# Patient Record
Sex: Male | Born: 1950
Health system: Southern US, Community
[De-identification: ages and names within clinical notes are randomized; demographics above are authoritative.]

## PROBLEM LIST (undated history)

## (undated) DIAGNOSIS — R0600 Dyspnea, unspecified: Secondary | ICD-10-CM

## (undated) DIAGNOSIS — A419 Sepsis, unspecified organism: Secondary | ICD-10-CM

## (undated) DIAGNOSIS — M1990 Unspecified osteoarthritis, unspecified site: Secondary | ICD-10-CM

## (undated) DIAGNOSIS — N186 End stage renal disease: Secondary | ICD-10-CM

## (undated) DIAGNOSIS — E1142 Type 2 diabetes mellitus with diabetic polyneuropathy: Secondary | ICD-10-CM

## (undated) DIAGNOSIS — I509 Heart failure, unspecified: Secondary | ICD-10-CM

## (undated) DIAGNOSIS — E119 Type 2 diabetes mellitus without complications: Secondary | ICD-10-CM

## (undated) DIAGNOSIS — I639 Cerebral infarction, unspecified: Secondary | ICD-10-CM

## (undated) DIAGNOSIS — Z9889 Other specified postprocedural states: Secondary | ICD-10-CM

## (undated) DIAGNOSIS — E1122 Type 2 diabetes mellitus with diabetic chronic kidney disease: Secondary | ICD-10-CM

## (undated) DIAGNOSIS — D649 Anemia, unspecified: Secondary | ICD-10-CM

## (undated) DIAGNOSIS — N183 Chronic kidney disease, stage 3 unspecified: Secondary | ICD-10-CM

## (undated) DIAGNOSIS — E039 Hypothyroidism, unspecified: Secondary | ICD-10-CM

## (undated) DIAGNOSIS — R51 Headache: Secondary | ICD-10-CM

## (undated) DIAGNOSIS — K219 Gastro-esophageal reflux disease without esophagitis: Secondary | ICD-10-CM

## (undated) DIAGNOSIS — I10 Essential (primary) hypertension: Secondary | ICD-10-CM

## (undated) DIAGNOSIS — R112 Nausea with vomiting, unspecified: Secondary | ICD-10-CM

## (undated) DIAGNOSIS — G2581 Restless legs syndrome: Secondary | ICD-10-CM

## (undated) DIAGNOSIS — Z992 Dependence on renal dialysis: Secondary | ICD-10-CM

## (undated) DIAGNOSIS — R519 Headache, unspecified: Secondary | ICD-10-CM

## (undated) HISTORY — PX: POSTERIOR LUMBAR FUSION: SHX6036

## (undated) HISTORY — PX: CATARACT EXTRACTION W/ INTRAOCULAR LENS IMPLANT: SHX1309

## (undated) HISTORY — PX: BACK SURGERY: SHX140

---

## 2005-03-28 DIAGNOSIS — I639 Cerebral infarction, unspecified: Secondary | ICD-10-CM

## 2005-03-28 HISTORY — DX: Cerebral infarction, unspecified: I63.9

## 2005-11-04 ENCOUNTER — Ambulatory Visit (HOSPITAL_BASED_OUTPATIENT_CLINIC_OR_DEPARTMENT_OTHER): Admission: RE | Admit: 2005-11-04 | Discharge: 2005-11-04 | Payer: Self-pay | Admitting: Orthopaedic Surgery

## 2005-12-02 ENCOUNTER — Ambulatory Visit (HOSPITAL_BASED_OUTPATIENT_CLINIC_OR_DEPARTMENT_OTHER): Admission: RE | Admit: 2005-12-02 | Discharge: 2005-12-02 | Payer: Self-pay | Admitting: *Deleted

## 2006-03-29 ENCOUNTER — Inpatient Hospital Stay (HOSPITAL_COMMUNITY): Admission: RE | Admit: 2006-03-29 | Discharge: 2006-04-02 | Payer: Self-pay | Admitting: Orthopaedic Surgery

## 2013-03-28 HISTORY — PX: CIRCUMCISION: SUR203

## 2014-03-28 HISTORY — PX: HERNIA REPAIR: SHX51

## 2014-03-31 DIAGNOSIS — Z6827 Body mass index (BMI) 27.0-27.9, adult: Secondary | ICD-10-CM | POA: Diagnosis not present

## 2014-03-31 DIAGNOSIS — E1165 Type 2 diabetes mellitus with hyperglycemia: Secondary | ICD-10-CM | POA: Diagnosis not present

## 2014-03-31 DIAGNOSIS — Z2821 Immunization not carried out because of patient refusal: Secondary | ICD-10-CM | POA: Diagnosis not present

## 2014-03-31 DIAGNOSIS — N481 Balanitis: Secondary | ICD-10-CM | POA: Diagnosis not present

## 2014-03-31 DIAGNOSIS — I10 Essential (primary) hypertension: Secondary | ICD-10-CM | POA: Diagnosis not present

## 2014-03-31 DIAGNOSIS — Z720 Tobacco use: Secondary | ICD-10-CM | POA: Diagnosis not present

## 2014-05-06 DIAGNOSIS — Z23 Encounter for immunization: Secondary | ICD-10-CM | POA: Diagnosis not present

## 2014-05-06 DIAGNOSIS — Z887 Allergy status to serum and vaccine status: Secondary | ICD-10-CM | POA: Diagnosis not present

## 2014-05-06 DIAGNOSIS — E039 Hypothyroidism, unspecified: Secondary | ICD-10-CM | POA: Diagnosis not present

## 2014-05-06 DIAGNOSIS — E785 Hyperlipidemia, unspecified: Secondary | ICD-10-CM | POA: Diagnosis not present

## 2014-05-06 DIAGNOSIS — E1165 Type 2 diabetes mellitus with hyperglycemia: Secondary | ICD-10-CM | POA: Diagnosis not present

## 2014-05-06 DIAGNOSIS — N401 Enlarged prostate with lower urinary tract symptoms: Secondary | ICD-10-CM | POA: Diagnosis not present

## 2014-05-06 DIAGNOSIS — Z6827 Body mass index (BMI) 27.0-27.9, adult: Secondary | ICD-10-CM | POA: Diagnosis not present

## 2014-05-06 DIAGNOSIS — Z1211 Encounter for screening for malignant neoplasm of colon: Secondary | ICD-10-CM | POA: Diagnosis not present

## 2014-05-06 DIAGNOSIS — I10 Essential (primary) hypertension: Secondary | ICD-10-CM | POA: Diagnosis not present

## 2014-06-03 DIAGNOSIS — E1165 Type 2 diabetes mellitus with hyperglycemia: Secondary | ICD-10-CM | POA: Diagnosis not present

## 2014-06-03 DIAGNOSIS — N401 Enlarged prostate with lower urinary tract symptoms: Secondary | ICD-10-CM | POA: Diagnosis not present

## 2014-06-03 DIAGNOSIS — E039 Hypothyroidism, unspecified: Secondary | ICD-10-CM | POA: Diagnosis not present

## 2014-06-03 DIAGNOSIS — M791 Myalgia: Secondary | ICD-10-CM | POA: Diagnosis not present

## 2014-06-03 DIAGNOSIS — N521 Erectile dysfunction due to diseases classified elsewhere: Secondary | ICD-10-CM | POA: Diagnosis not present

## 2014-06-03 DIAGNOSIS — I10 Essential (primary) hypertension: Secondary | ICD-10-CM | POA: Diagnosis not present

## 2014-06-03 DIAGNOSIS — Z6827 Body mass index (BMI) 27.0-27.9, adult: Secondary | ICD-10-CM | POA: Diagnosis not present

## 2014-09-09 DIAGNOSIS — E039 Hypothyroidism, unspecified: Secondary | ICD-10-CM | POA: Diagnosis not present

## 2014-09-09 DIAGNOSIS — I739 Peripheral vascular disease, unspecified: Secondary | ICD-10-CM | POA: Diagnosis not present

## 2014-09-09 DIAGNOSIS — Z6827 Body mass index (BMI) 27.0-27.9, adult: Secondary | ICD-10-CM | POA: Diagnosis not present

## 2014-09-09 DIAGNOSIS — N481 Balanitis: Secondary | ICD-10-CM | POA: Diagnosis not present

## 2014-09-09 DIAGNOSIS — E1129 Type 2 diabetes mellitus with other diabetic kidney complication: Secondary | ICD-10-CM | POA: Diagnosis not present

## 2014-09-09 DIAGNOSIS — E785 Hyperlipidemia, unspecified: Secondary | ICD-10-CM | POA: Diagnosis not present

## 2014-09-09 DIAGNOSIS — I10 Essential (primary) hypertension: Secondary | ICD-10-CM | POA: Diagnosis not present

## 2014-09-16 DIAGNOSIS — N3090 Cystitis, unspecified without hematuria: Secondary | ICD-10-CM | POA: Diagnosis not present

## 2014-09-16 DIAGNOSIS — M545 Low back pain: Secondary | ICD-10-CM | POA: Diagnosis not present

## 2014-09-16 DIAGNOSIS — N318 Other neuromuscular dysfunction of bladder: Secondary | ICD-10-CM | POA: Diagnosis not present

## 2014-09-16 DIAGNOSIS — N401 Enlarged prostate with lower urinary tract symptoms: Secondary | ICD-10-CM | POA: Diagnosis not present

## 2014-09-16 DIAGNOSIS — N471 Phimosis: Secondary | ICD-10-CM | POA: Diagnosis not present

## 2014-09-16 DIAGNOSIS — R351 Nocturia: Secondary | ICD-10-CM | POA: Diagnosis not present

## 2014-09-16 DIAGNOSIS — N476 Balanoposthitis: Secondary | ICD-10-CM | POA: Diagnosis not present

## 2014-09-16 DIAGNOSIS — N481 Balanitis: Secondary | ICD-10-CM | POA: Diagnosis not present

## 2014-09-24 DIAGNOSIS — N476 Balanoposthitis: Secondary | ICD-10-CM | POA: Diagnosis not present

## 2014-09-24 DIAGNOSIS — E039 Hypothyroidism, unspecified: Secondary | ICD-10-CM | POA: Diagnosis not present

## 2014-09-24 DIAGNOSIS — J449 Chronic obstructive pulmonary disease, unspecified: Secondary | ICD-10-CM | POA: Diagnosis not present

## 2014-09-24 DIAGNOSIS — N471 Phimosis: Secondary | ICD-10-CM | POA: Diagnosis not present

## 2014-09-24 DIAGNOSIS — E785 Hyperlipidemia, unspecified: Secondary | ICD-10-CM | POA: Diagnosis not present

## 2014-09-24 DIAGNOSIS — Z8673 Personal history of transient ischemic attack (TIA), and cerebral infarction without residual deficits: Secondary | ICD-10-CM | POA: Diagnosis not present

## 2014-09-24 DIAGNOSIS — E119 Type 2 diabetes mellitus without complications: Secondary | ICD-10-CM | POA: Diagnosis not present

## 2014-09-24 DIAGNOSIS — K219 Gastro-esophageal reflux disease without esophagitis: Secondary | ICD-10-CM | POA: Diagnosis not present

## 2014-09-24 DIAGNOSIS — I10 Essential (primary) hypertension: Secondary | ICD-10-CM | POA: Diagnosis not present

## 2014-10-02 DIAGNOSIS — N471 Phimosis: Secondary | ICD-10-CM | POA: Diagnosis not present

## 2014-10-02 DIAGNOSIS — N3090 Cystitis, unspecified without hematuria: Secondary | ICD-10-CM | POA: Diagnosis not present

## 2014-10-02 DIAGNOSIS — N476 Balanoposthitis: Secondary | ICD-10-CM | POA: Diagnosis not present

## 2014-10-13 DIAGNOSIS — Z139 Encounter for screening, unspecified: Secondary | ICD-10-CM | POA: Diagnosis not present

## 2014-10-13 DIAGNOSIS — Z1389 Encounter for screening for other disorder: Secondary | ICD-10-CM | POA: Diagnosis not present

## 2014-10-13 DIAGNOSIS — I10 Essential (primary) hypertension: Secondary | ICD-10-CM | POA: Diagnosis not present

## 2014-10-13 DIAGNOSIS — Z9181 History of falling: Secondary | ICD-10-CM | POA: Diagnosis not present

## 2014-10-13 DIAGNOSIS — Z6827 Body mass index (BMI) 27.0-27.9, adult: Secondary | ICD-10-CM | POA: Diagnosis not present

## 2014-10-28 DIAGNOSIS — N401 Enlarged prostate with lower urinary tract symptoms: Secondary | ICD-10-CM | POA: Diagnosis not present

## 2014-10-28 DIAGNOSIS — N471 Phimosis: Secondary | ICD-10-CM | POA: Diagnosis not present

## 2014-10-28 DIAGNOSIS — N3090 Cystitis, unspecified without hematuria: Secondary | ICD-10-CM | POA: Diagnosis not present

## 2014-10-28 DIAGNOSIS — N318 Other neuromuscular dysfunction of bladder: Secondary | ICD-10-CM | POA: Diagnosis not present

## 2014-10-28 DIAGNOSIS — H52209 Unspecified astigmatism, unspecified eye: Secondary | ICD-10-CM | POA: Diagnosis not present

## 2014-10-28 DIAGNOSIS — H5203 Hypermetropia, bilateral: Secondary | ICD-10-CM | POA: Diagnosis not present

## 2014-10-28 DIAGNOSIS — N476 Balanoposthitis: Secondary | ICD-10-CM | POA: Diagnosis not present

## 2014-10-28 DIAGNOSIS — N5203 Combined arterial insufficiency and corporo-venous occlusive erectile dysfunction: Secondary | ICD-10-CM | POA: Diagnosis not present

## 2014-10-28 DIAGNOSIS — Z0100 Encounter for examination of eyes and vision without abnormal findings: Secondary | ICD-10-CM | POA: Diagnosis not present

## 2014-10-28 DIAGNOSIS — H524 Presbyopia: Secondary | ICD-10-CM | POA: Diagnosis not present

## 2014-11-03 DIAGNOSIS — I10 Essential (primary) hypertension: Secondary | ICD-10-CM | POA: Diagnosis not present

## 2014-11-03 DIAGNOSIS — Z6827 Body mass index (BMI) 27.0-27.9, adult: Secondary | ICD-10-CM | POA: Diagnosis not present

## 2014-12-15 DIAGNOSIS — E039 Hypothyroidism, unspecified: Secondary | ICD-10-CM | POA: Diagnosis not present

## 2014-12-15 DIAGNOSIS — Z6827 Body mass index (BMI) 27.0-27.9, adult: Secondary | ICD-10-CM | POA: Diagnosis not present

## 2014-12-15 DIAGNOSIS — Z720 Tobacco use: Secondary | ICD-10-CM | POA: Diagnosis not present

## 2014-12-15 DIAGNOSIS — I10 Essential (primary) hypertension: Secondary | ICD-10-CM | POA: Diagnosis not present

## 2014-12-15 DIAGNOSIS — E1129 Type 2 diabetes mellitus with other diabetic kidney complication: Secondary | ICD-10-CM | POA: Diagnosis not present

## 2014-12-15 DIAGNOSIS — E291 Testicular hypofunction: Secondary | ICD-10-CM | POA: Diagnosis not present

## 2014-12-15 DIAGNOSIS — E785 Hyperlipidemia, unspecified: Secondary | ICD-10-CM | POA: Diagnosis not present

## 2015-01-14 DIAGNOSIS — I10 Essential (primary) hypertension: Secondary | ICD-10-CM | POA: Diagnosis not present

## 2015-01-14 DIAGNOSIS — Z6828 Body mass index (BMI) 28.0-28.9, adult: Secondary | ICD-10-CM | POA: Diagnosis not present

## 2015-01-14 DIAGNOSIS — E1129 Type 2 diabetes mellitus with other diabetic kidney complication: Secondary | ICD-10-CM | POA: Diagnosis not present

## 2015-01-28 DIAGNOSIS — I10 Essential (primary) hypertension: Secondary | ICD-10-CM | POA: Diagnosis not present

## 2015-01-28 DIAGNOSIS — E1129 Type 2 diabetes mellitus with other diabetic kidney complication: Secondary | ICD-10-CM | POA: Diagnosis not present

## 2015-01-28 DIAGNOSIS — Z6828 Body mass index (BMI) 28.0-28.9, adult: Secondary | ICD-10-CM | POA: Diagnosis not present

## 2015-01-28 DIAGNOSIS — Z139 Encounter for screening, unspecified: Secondary | ICD-10-CM | POA: Diagnosis not present

## 2015-02-02 DIAGNOSIS — R319 Hematuria, unspecified: Secondary | ICD-10-CM | POA: Diagnosis not present

## 2015-02-02 DIAGNOSIS — R51 Headache: Secondary | ICD-10-CM | POA: Diagnosis not present

## 2015-02-02 DIAGNOSIS — I159 Secondary hypertension, unspecified: Secondary | ICD-10-CM | POA: Diagnosis not present

## 2015-02-02 DIAGNOSIS — R0602 Shortness of breath: Secondary | ICD-10-CM | POA: Diagnosis not present

## 2015-02-02 DIAGNOSIS — I10 Essential (primary) hypertension: Secondary | ICD-10-CM | POA: Diagnosis not present

## 2015-02-02 DIAGNOSIS — R109 Unspecified abdominal pain: Secondary | ICD-10-CM | POA: Diagnosis not present

## 2015-02-02 DIAGNOSIS — R1012 Left upper quadrant pain: Secondary | ICD-10-CM | POA: Diagnosis not present

## 2015-02-02 DIAGNOSIS — R531 Weakness: Secondary | ICD-10-CM | POA: Diagnosis not present

## 2015-02-11 DIAGNOSIS — I10 Essential (primary) hypertension: Secondary | ICD-10-CM | POA: Diagnosis not present

## 2015-02-11 DIAGNOSIS — Z6827 Body mass index (BMI) 27.0-27.9, adult: Secondary | ICD-10-CM | POA: Diagnosis not present

## 2015-03-17 DIAGNOSIS — Z125 Encounter for screening for malignant neoplasm of prostate: Secondary | ICD-10-CM | POA: Diagnosis not present

## 2015-03-17 DIAGNOSIS — E1129 Type 2 diabetes mellitus with other diabetic kidney complication: Secondary | ICD-10-CM | POA: Diagnosis not present

## 2015-03-17 DIAGNOSIS — E039 Hypothyroidism, unspecified: Secondary | ICD-10-CM | POA: Diagnosis not present

## 2015-03-17 DIAGNOSIS — K219 Gastro-esophageal reflux disease without esophagitis: Secondary | ICD-10-CM | POA: Diagnosis not present

## 2015-03-17 DIAGNOSIS — E785 Hyperlipidemia, unspecified: Secondary | ICD-10-CM | POA: Diagnosis not present

## 2015-03-17 DIAGNOSIS — Z6827 Body mass index (BMI) 27.0-27.9, adult: Secondary | ICD-10-CM | POA: Diagnosis not present

## 2015-03-17 DIAGNOSIS — I10 Essential (primary) hypertension: Secondary | ICD-10-CM | POA: Diagnosis not present

## 2015-05-14 DIAGNOSIS — Z6826 Body mass index (BMI) 26.0-26.9, adult: Secondary | ICD-10-CM | POA: Diagnosis not present

## 2015-05-14 DIAGNOSIS — K219 Gastro-esophageal reflux disease without esophagitis: Secondary | ICD-10-CM | POA: Diagnosis not present

## 2015-05-14 DIAGNOSIS — J321 Chronic frontal sinusitis: Secondary | ICD-10-CM | POA: Diagnosis not present

## 2015-06-17 DIAGNOSIS — Z6826 Body mass index (BMI) 26.0-26.9, adult: Secondary | ICD-10-CM | POA: Diagnosis not present

## 2015-06-17 DIAGNOSIS — E785 Hyperlipidemia, unspecified: Secondary | ICD-10-CM | POA: Diagnosis not present

## 2015-06-17 DIAGNOSIS — I10 Essential (primary) hypertension: Secondary | ICD-10-CM | POA: Diagnosis not present

## 2015-06-17 DIAGNOSIS — E039 Hypothyroidism, unspecified: Secondary | ICD-10-CM | POA: Diagnosis not present

## 2015-06-17 DIAGNOSIS — Z720 Tobacco use: Secondary | ICD-10-CM | POA: Diagnosis not present

## 2015-06-17 DIAGNOSIS — E1129 Type 2 diabetes mellitus with other diabetic kidney complication: Secondary | ICD-10-CM | POA: Diagnosis not present

## 2015-07-13 DIAGNOSIS — H524 Presbyopia: Secondary | ICD-10-CM | POA: Diagnosis not present

## 2015-07-13 DIAGNOSIS — H5210 Myopia, unspecified eye: Secondary | ICD-10-CM | POA: Diagnosis not present

## 2015-09-30 DIAGNOSIS — E039 Hypothyroidism, unspecified: Secondary | ICD-10-CM | POA: Diagnosis not present

## 2015-09-30 DIAGNOSIS — Z6826 Body mass index (BMI) 26.0-26.9, adult: Secondary | ICD-10-CM | POA: Diagnosis not present

## 2015-09-30 DIAGNOSIS — E785 Hyperlipidemia, unspecified: Secondary | ICD-10-CM | POA: Diagnosis not present

## 2015-09-30 DIAGNOSIS — E1129 Type 2 diabetes mellitus with other diabetic kidney complication: Secondary | ICD-10-CM | POA: Diagnosis not present

## 2015-09-30 DIAGNOSIS — I10 Essential (primary) hypertension: Secondary | ICD-10-CM | POA: Diagnosis not present

## 2015-09-30 DIAGNOSIS — R238 Other skin changes: Secondary | ICD-10-CM | POA: Diagnosis not present

## 2015-09-30 DIAGNOSIS — Z720 Tobacco use: Secondary | ICD-10-CM | POA: Diagnosis not present

## 2015-10-01 DIAGNOSIS — D649 Anemia, unspecified: Secondary | ICD-10-CM | POA: Diagnosis not present

## 2015-10-01 DIAGNOSIS — D531 Other megaloblastic anemias, not elsewhere classified: Secondary | ICD-10-CM | POA: Diagnosis not present

## 2015-10-01 DIAGNOSIS — N183 Chronic kidney disease, stage 3 (moderate): Secondary | ICD-10-CM | POA: Diagnosis not present

## 2015-10-07 DIAGNOSIS — N183 Chronic kidney disease, stage 3 (moderate): Secondary | ICD-10-CM | POA: Diagnosis not present

## 2015-10-09 DIAGNOSIS — N189 Chronic kidney disease, unspecified: Secondary | ICD-10-CM | POA: Diagnosis not present

## 2015-10-09 DIAGNOSIS — N183 Chronic kidney disease, stage 3 (moderate): Secondary | ICD-10-CM | POA: Diagnosis not present

## 2016-01-07 DIAGNOSIS — I129 Hypertensive chronic kidney disease with stage 1 through stage 4 chronic kidney disease, or unspecified chronic kidney disease: Secondary | ICD-10-CM | POA: Diagnosis not present

## 2016-01-07 DIAGNOSIS — Z6826 Body mass index (BMI) 26.0-26.9, adult: Secondary | ICD-10-CM | POA: Diagnosis not present

## 2016-01-07 DIAGNOSIS — R110 Nausea: Secondary | ICD-10-CM | POA: Diagnosis not present

## 2016-01-07 DIAGNOSIS — Z23 Encounter for immunization: Secondary | ICD-10-CM | POA: Diagnosis not present

## 2016-01-07 DIAGNOSIS — E039 Hypothyroidism, unspecified: Secondary | ICD-10-CM | POA: Diagnosis not present

## 2016-01-07 DIAGNOSIS — N183 Chronic kidney disease, stage 3 (moderate): Secondary | ICD-10-CM | POA: Diagnosis not present

## 2016-01-07 DIAGNOSIS — E1129 Type 2 diabetes mellitus with other diabetic kidney complication: Secondary | ICD-10-CM | POA: Diagnosis not present

## 2016-01-07 DIAGNOSIS — E785 Hyperlipidemia, unspecified: Secondary | ICD-10-CM | POA: Diagnosis not present

## 2016-01-07 DIAGNOSIS — D638 Anemia in other chronic diseases classified elsewhere: Secondary | ICD-10-CM | POA: Diagnosis not present

## 2016-02-19 DIAGNOSIS — R21 Rash and other nonspecific skin eruption: Secondary | ICD-10-CM | POA: Diagnosis not present

## 2016-02-26 DIAGNOSIS — H2511 Age-related nuclear cataract, right eye: Secondary | ICD-10-CM | POA: Diagnosis not present

## 2016-03-09 ENCOUNTER — Inpatient Hospital Stay (HOSPITAL_COMMUNITY)
Admission: AD | Admit: 2016-03-09 | Discharge: 2016-03-16 | DRG: 682 | Disposition: A | Discharge status: Home / Self Care | Payer: Commercial Managed Care - HMO | Source: Other Acute Inpatient Hospital | Attending: Family Medicine | Admitting: Family Medicine

## 2016-03-09 ENCOUNTER — Encounter (HOSPITAL_COMMUNITY): Payer: Self-pay | Admitting: Internal Medicine

## 2016-03-09 DIAGNOSIS — N179 Acute kidney failure, unspecified: Secondary | ICD-10-CM | POA: Diagnosis not present

## 2016-03-09 DIAGNOSIS — F17200 Nicotine dependence, unspecified, uncomplicated: Secondary | ICD-10-CM

## 2016-03-09 DIAGNOSIS — D631 Anemia in chronic kidney disease: Secondary | ICD-10-CM | POA: Diagnosis not present

## 2016-03-09 DIAGNOSIS — N183 Chronic kidney disease, stage 3 (moderate): Secondary | ICD-10-CM | POA: Diagnosis present

## 2016-03-09 DIAGNOSIS — N189 Chronic kidney disease, unspecified: Secondary | ICD-10-CM | POA: Diagnosis not present

## 2016-03-09 DIAGNOSIS — Z79899 Other long term (current) drug therapy: Secondary | ICD-10-CM | POA: Diagnosis not present

## 2016-03-09 DIAGNOSIS — D638 Anemia in other chronic diseases classified elsewhere: Secondary | ICD-10-CM | POA: Diagnosis present

## 2016-03-09 DIAGNOSIS — Z8673 Personal history of transient ischemic attack (TIA), and cerebral infarction without residual deficits: Secondary | ICD-10-CM | POA: Diagnosis not present

## 2016-03-09 DIAGNOSIS — E119 Type 2 diabetes mellitus without complications: Secondary | ICD-10-CM

## 2016-03-09 DIAGNOSIS — N049 Nephrotic syndrome with unspecified morphologic changes: Secondary | ICD-10-CM | POA: Diagnosis present

## 2016-03-09 DIAGNOSIS — E1122 Type 2 diabetes mellitus with diabetic chronic kidney disease: Secondary | ICD-10-CM | POA: Diagnosis present

## 2016-03-09 DIAGNOSIS — E11649 Type 2 diabetes mellitus with hypoglycemia without coma: Secondary | ICD-10-CM | POA: Diagnosis not present

## 2016-03-09 DIAGNOSIS — I5031 Acute diastolic (congestive) heart failure: Secondary | ICD-10-CM | POA: Diagnosis present

## 2016-03-09 DIAGNOSIS — R0602 Shortness of breath: Secondary | ICD-10-CM | POA: Diagnosis not present

## 2016-03-09 DIAGNOSIS — Z720 Tobacco use: Secondary | ICD-10-CM | POA: Diagnosis not present

## 2016-03-09 DIAGNOSIS — I130 Hypertensive heart and chronic kidney disease with heart failure and stage 1 through stage 4 chronic kidney disease, or unspecified chronic kidney disease: Secondary | ICD-10-CM | POA: Diagnosis not present

## 2016-03-09 DIAGNOSIS — E8770 Fluid overload, unspecified: Secondary | ICD-10-CM | POA: Diagnosis not present

## 2016-03-09 DIAGNOSIS — N390 Urinary tract infection, site not specified: Secondary | ICD-10-CM | POA: Diagnosis not present

## 2016-03-09 DIAGNOSIS — F17210 Nicotine dependence, cigarettes, uncomplicated: Secondary | ICD-10-CM | POA: Diagnosis present

## 2016-03-09 DIAGNOSIS — I129 Hypertensive chronic kidney disease with stage 1 through stage 4 chronic kidney disease, or unspecified chronic kidney disease: Secondary | ICD-10-CM | POA: Diagnosis not present

## 2016-03-09 DIAGNOSIS — B952 Enterococcus as the cause of diseases classified elsewhere: Secondary | ICD-10-CM | POA: Diagnosis not present

## 2016-03-09 DIAGNOSIS — Z7982 Long term (current) use of aspirin: Secondary | ICD-10-CM | POA: Diagnosis not present

## 2016-03-09 DIAGNOSIS — Z794 Long term (current) use of insulin: Secondary | ICD-10-CM

## 2016-03-09 DIAGNOSIS — I5033 Acute on chronic diastolic (congestive) heart failure: Secondary | ICD-10-CM | POA: Diagnosis not present

## 2016-03-09 DIAGNOSIS — I10 Essential (primary) hypertension: Secondary | ICD-10-CM | POA: Diagnosis not present

## 2016-03-09 DIAGNOSIS — Z823 Family history of stroke: Secondary | ICD-10-CM | POA: Diagnosis not present

## 2016-03-09 DIAGNOSIS — E875 Hyperkalemia: Secondary | ICD-10-CM | POA: Diagnosis present

## 2016-03-09 DIAGNOSIS — E039 Hypothyroidism, unspecified: Secondary | ICD-10-CM | POA: Diagnosis not present

## 2016-03-09 DIAGNOSIS — E1129 Type 2 diabetes mellitus with other diabetic kidney complication: Secondary | ICD-10-CM | POA: Diagnosis not present

## 2016-03-09 DIAGNOSIS — J019 Acute sinusitis, unspecified: Secondary | ICD-10-CM | POA: Diagnosis not present

## 2016-03-09 DIAGNOSIS — R600 Localized edema: Secondary | ICD-10-CM | POA: Diagnosis not present

## 2016-03-09 DIAGNOSIS — G2581 Restless legs syndrome: Secondary | ICD-10-CM | POA: Diagnosis present

## 2016-03-09 DIAGNOSIS — I509 Heart failure, unspecified: Secondary | ICD-10-CM | POA: Diagnosis not present

## 2016-03-09 DIAGNOSIS — R609 Edema, unspecified: Secondary | ICD-10-CM | POA: Diagnosis present

## 2016-03-09 DIAGNOSIS — I160 Hypertensive urgency: Secondary | ICD-10-CM | POA: Diagnosis present

## 2016-03-09 DIAGNOSIS — R809 Proteinuria, unspecified: Secondary | ICD-10-CM | POA: Diagnosis not present

## 2016-03-09 HISTORY — DX: Essential (primary) hypertension: I10

## 2016-03-09 HISTORY — DX: Chronic kidney disease, stage 3 unspecified: N18.30

## 2016-03-09 HISTORY — DX: Chronic kidney disease, stage 3 (moderate): N18.3

## 2016-03-09 HISTORY — DX: Restless legs syndrome: G25.81

## 2016-03-09 HISTORY — DX: Hypothyroidism, unspecified: E03.9

## 2016-03-09 HISTORY — DX: Type 2 diabetes mellitus with diabetic chronic kidney disease: E11.22

## 2016-03-09 HISTORY — DX: Cerebral infarction, unspecified: I63.9

## 2016-03-09 NOTE — H&P (Signed)
History and Physical    Alexander Whitaker LFY:[id number] DOB: 05-19-1950 DOA: 03/09/2016  PCP: [name], MD Consultants:  [facility] - urology Patient coming from: home - lives with wife; NOK: wife, [phone]  Chief Complaint: edema  HPI: Alexander Whitaker is a 65 y.o. male with medical history significant of HTN, DM, h/o CVA, restless legs, hypothyroidism, and CKD stage 3 presenting with acute renal failure.  Started with LE edema about 1 1/2 months ago.  Broke out in a rash.  Went to see physician, was given Kenalog shot.  Did not help fluid, but did help rash some (still present "a tad").  SOB x 1 month, progressive.  Worse with ambulation, sometimes while lying flat feels smothered.  Has used an inhaler with some improvement.  This AM, noticed severe SOB.  Took an [employer] advertising and got better so did not call 911.  Coughs, "feels like my throat's gonna come up", thinks it might be related to sinuses.  Cough only sporadic.   At [facility] today, BP was 221/94, unable to get it down for a while.   Has seen nephrology - had circumcision about 2 years ago, lifetime 30-35 kidney stones, lithotripsy once.  Urologist did not see any stones.     Review of Systems: As per HPI; otherwise 10 point review of systems reviewed and negative.   Ambulatory Status:  Ambulates without assistance  Past Medical History:  Diagnosis Date  . CKD stage 3 due to type 2 diabetes mellitus ([place])   . CVA (cerebral vascular accident) ([place]) 2016  . Diabetes mellitus ([place])   . Hypertension   . Hypothyroidism   . Restless legs     Past Surgical History:  Procedure Laterality Date  . BACK SURGERY    . CIRCUMCISION  2015  . HERNIA REPAIR  2016    Social History   Social History  . Marital status: Married    Spouse name: N/A  . Number of children: N/A  . Years of education: N/A   Occupational History  . retired    Social History Main Topics  . Smoking status: Current Every Day Smoker   Packs/day: 1.00    Years: 57.00  . Smokeless tobacco: Never Used  . Alcohol use Yes     Comment: rare beer  . Drug use: No  . Sexual activity: Not on file   Other Topics Concern  . Not on file   Social History Narrative  . No narrative on file    Allergies not on file  Family History  Problem Relation Age of Onset  . Leukemia Mother 10  . CVA Father 40    Prior to Admission medications   Not on File    Physical Exam: Vitals:   03/09/16 2157  BP: (!) 184/73  Pulse: 80  Resp: 18  Temp: 98.8 F (37.1 C)  TempSrc: Oral  SpO2: 100%     General:  Appears calm and comfortable and is NAD Eyes:  normal lids, iris ENT:  grossly normal hearing, lips & tongue, mmm Neck:  no LAD, masses or thyromegaly Cardiovascular:  RRR, no m/r/g. 2+ LE edema.  Respiratory:  Mild bibasilar crackles. Normal respiratory effort. Abdomen:  soft, ntnd, NABS Skin:  no rash or induration seen on limited exam Musculoskeletal:  grossly normal tone BUE/BLE, good ROM, no bony abnormality Psychiatric:  grossly normal mood and affect, speech fluent and appropriate, AOx3 Neurologic:  CN 2-12 grossly intact, moves all extremities in coordinated fashion,  sensation intact  Labs on Admission: I have personally reviewed following labs and imaging studies  CBC: No results for input(s): WBC, NEUTROABS, HGB, HCT, MCV, PLT in the last 168 hours. Basic Metabolic Panel: No results for input(s): NA, K, CL, CO2, GLUCOSE, BUN, CREATININE, CALCIUM, MG, PHOS in the last 168 hours. GFR: CrCl cannot be calculated (No order found.). Liver Function Tests: No results for input(s): AST, ALT, ALKPHOS, BILITOT, PROT, ALBUMIN in the last 168 hours. No results for input(s): LIPASE, AMYLASE in the last 168 hours. No results for input(s): AMMONIA in the last 168 hours. Coagulation Profile: No results for input(s): INR, PROTIME in the last 168 hours. Cardiac Enzymes: No results for input(s): CKTOTAL, CKMB, CKMBINDEX,  TROPONINI in the last 168 hours. BNP (last 3 results) No results for input(s): PROBNP in the last 8760 hours. HbA1C: No results for input(s): HGBA1C in the last 72 hours. CBG: No results for input(s): GLUCAP in the last 168 hours. Lipid Profile: No results for input(s): CHOL, HDL, LDLCALC, TRIG, CHOLHDL, LDLDIRECT in the last 72 hours. Thyroid Function Tests: No results for input(s): TSH, T4TOTAL, FREET4, T3FREE, THYROIDAB in the last 72 hours. Anemia Panel: No results for input(s): VITAMINB12, FOLATE, FERRITIN, TIBC, IRON, RETICCTPCT in the last 72 hours. Urine analysis: No results found for: COLORURINE, APPEARANCEUR, LABSPEC, PHURINE, GLUCOSEU, HGBUR, BILIRUBINUR, KETONESUR, PROTEINUR, UROBILINOGEN, NITRITE, LEUKOCYTESUR  Creatinine Clearance: CrCl cannot be calculated (No order found.).  Sepsis Labs: $[email or web address]$ (procalcitonin:4,lacticidven:4) )No results found for this or any previous visit (from the past 240 hour(s)).   Radiological Exams on Admission: No results found.  EKG: normal by report from [facility]; ordered and pending here  Labs from [facility]:  WBC 11.6, Hgb 8.8, Plt 210 Na 143, K 5, Cl 114, CO2 20, BUN 48, Creatinine 5.1, Glu 71 Normal LFTs Normal troponin BNP 13,500 Albumin 2.9 UA 3+ protein, 1+ blood, trace glucose INR 1.1 CXR - CHF, moderate pulmonary edema  Home medications:  MVI ASA 162 mg daily Metoprolol 25 mg BID Norvasc 5 mg daily Levemir 40 units qhs Lispro 10 units qAC Synthroid 25 mcg daily Flomax 0.4 mg daily Hydralazine 25 mg BID Zestril 40 mg daily  Assessment/Plan Principal Problem:   Acute renal failure superimposed on chronic kidney disease (HCC) Active Problems:   Acute diastolic congestive heart failure (HCC)   Diabetes mellitus, type 2 (HCC)   Malignant hypertension   Hypothyroidism   Tobacco use disorder   Acute renal failure -Uncertain etiology -Has known baseline CKD stage 3 -Has been taking Lisinopril 40 mg daily -  ?related -UA with marked proteinuria, mild hematuria - could be nephrotic syndrome but differential is much more significant than this -Could be central or nephrogenic process -Repeat UA here -Repeat labs here - CBC, CMP, Mag, phos -Nephrology order set utilized -STAT renal ultrasound ordered -Will give 1 dose of Lasix to see if this improves diuresis and renal function -Needs nephrology consult in AM, may need short-term or long-term dialysis to try to effectively diurese -Nephrology prn order set utilized  Acute heart failure -Likely diastolic in nature from volume overload associated with renal failure -Will attempt Lasix 40 mg IV x 1 -Renal consult in AM -Consider Echo, but strongly suspect kidney failure as the cause  DM -Will check A1c -Continue Levemir but hold qAC Lispro -Cover with SSI  Malignant HTN -May be hypertensive emergency given renal failure, but may also be uncontrolled HTN due to renal failure -Denies headache, chest pain, or other symptoms -Will not resume  Norvasc due to volume overload -Will continue PO metoprolol -Will change to IV hydralazine prn  Hypothyroidism -Check TSH -Continue Synthroid at current dose for now  Tobacco dependence -Encourage cessation.  This was discussed with the patient and should be reviewed on an ongoing basis.   -Patch ordered at patient request.  DVT prophylaxis: Lovenox Code Status:  Full - confirmed with patient/family Family Communication: Wife present throughout evaluation  Disposition Plan:  Home once clinically improved Consults called: Nephrology in AM  Admission status: Admit - It is my clinical opinion that admission to INPATIENT is reasonable and necessary because this patient will require at least 2 midnights in the hospital to treat this condition based on the medical complexity of the problems presented.  Given the aforementioned information, the predictability of an adverse outcome is felt to be  significant.    Karmen Bongo MD [facility]  If 7PM-7AM, please contact night-coverage [email or web address] Password [email or web address]  03/10/2016, 3:15 AM

## 2016-03-10 ENCOUNTER — Inpatient Hospital Stay (HOSPITAL_COMMUNITY): Payer: Commercial Managed Care - HMO

## 2016-03-10 DIAGNOSIS — E119 Type 2 diabetes mellitus without complications: Secondary | ICD-10-CM

## 2016-03-10 DIAGNOSIS — I5031 Acute diastolic (congestive) heart failure: Secondary | ICD-10-CM | POA: Diagnosis present

## 2016-03-10 DIAGNOSIS — I10 Essential (primary) hypertension: Secondary | ICD-10-CM | POA: Diagnosis present

## 2016-03-10 DIAGNOSIS — E039 Hypothyroidism, unspecified: Secondary | ICD-10-CM | POA: Diagnosis present

## 2016-03-10 DIAGNOSIS — N189 Chronic kidney disease, unspecified: Secondary | ICD-10-CM

## 2016-03-10 DIAGNOSIS — F17200 Nicotine dependence, unspecified, uncomplicated: Secondary | ICD-10-CM | POA: Diagnosis present

## 2016-03-10 DIAGNOSIS — N179 Acute kidney failure, unspecified: Secondary | ICD-10-CM | POA: Diagnosis present

## 2016-03-10 LAB — URINALYSIS, ROUTINE W REFLEX MICROSCOPIC
Bilirubin Urine: NEGATIVE
Glucose, UA: >=500 mg/dL — AB
KETONES UR: NEGATIVE mg/dL
Leukocytes, UA: NEGATIVE
Nitrite: NEGATIVE
Specific Gravity, Urine: 1.011 (ref 1.005–1.030)
Squamous Epithelial / LPF: NONE SEEN
pH: 6 (ref 5.0–8.0)

## 2016-03-10 LAB — COMPREHENSIVE METABOLIC PANEL
ALK PHOS: 74 U/L (ref 38–126)
ALT: 13 U/L — AB (ref 17–63)
AST: 13 U/L — ABNORMAL LOW (ref 15–41)
Albumin: 2.3 g/dL — ABNORMAL LOW (ref 3.5–5.0)
Anion gap: 10 (ref 5–15)
BILIRUBIN TOTAL: 0.2 mg/dL — AB (ref 0.3–1.2)
BUN: 61 mg/dL — AB (ref 6–20)
CALCIUM: 7.4 mg/dL — AB (ref 8.9–10.3)
CHLORIDE: 106 mmol/L (ref 101–111)
CO2: 18 mmol/L — ABNORMAL LOW (ref 22–32)
CREATININE: 5.11 mg/dL — AB (ref 0.61–1.24)
GFR, EST AFRICAN AMERICAN: 12 mL/min — AB (ref >60)
GFR, EST NON AFRICAN AMERICAN: 11 mL/min — AB (ref >60)
Glucose, Bld: 360 mg/dL — ABNORMAL HIGH (ref 65–99)
Potassium: 5.3 mmol/L — ABNORMAL HIGH (ref 3.5–5.1)
Sodium: 134 mmol/L — ABNORMAL LOW (ref 135–145)
TOTAL PROTEIN: 5.4 g/dL — AB (ref 6.5–8.1)

## 2016-03-10 LAB — PROTEIN / CREATININE RATIO, URINE
Creatinine, Urine: 46.48 mg/dL
PROTEIN CREATININE RATIO: 16.14 mg/mg{creat} — AB (ref 0.00–0.15)
TOTAL PROTEIN, URINE: 750 mg/dL

## 2016-03-10 LAB — GLUCOSE, CAPILLARY
GLUCOSE-CAPILLARY: 218 mg/dL — AB (ref 65–99)
Glucose-Capillary: 290 mg/dL — ABNORMAL HIGH (ref 65–99)
Glucose-Capillary: 129 mg/dL — ABNORMAL HIGH (ref 65–99)
Glucose-Capillary: 135 mg/dL — ABNORMAL HIGH (ref 65–99)

## 2016-03-10 LAB — CBC WITH DIFFERENTIAL/PLATELET
BASOS PCT: 0 %
Basophils Absolute: 0 10*3/uL (ref 0.0–0.1)
EOS PCT: 0 %
Eosinophils Absolute: 0 10*3/uL (ref 0.0–0.7)
HEMATOCRIT: 23.3 % — AB (ref 39.0–52.0)
Hemoglobin: 8 g/dL — ABNORMAL LOW (ref 13.0–17.0)
Lymphocytes Relative: 7 %
Lymphs Abs: 0.5 10*3/uL — ABNORMAL LOW (ref 0.7–4.0)
MCH: 30.4 pg (ref 26.0–34.0)
MCHC: 34.3 g/dL (ref 30.0–36.0)
MCV: 88.6 fL (ref 78.0–100.0)
MONO ABS: 0.2 10*3/uL (ref 0.1–1.0)
MONOS PCT: 3 %
NEUTROS ABS: 6.3 10*3/uL (ref 1.7–7.7)
Neutrophils Relative %: 90 %
PLATELETS: 202 10*3/uL (ref 150–400)
RBC: 2.63 MIL/uL — ABNORMAL LOW (ref 4.22–5.81)
RDW: 13.2 % (ref 11.5–15.5)
WBC: 7 10*3/uL (ref 4.0–10.5)

## 2016-03-10 LAB — PHOSPHORUS: PHOSPHORUS: 5.5 mg/dL — AB (ref 2.5–4.6)

## 2016-03-10 LAB — MAGNESIUM: MAGNESIUM: 1.6 mg/dL — AB (ref 1.7–2.4)

## 2016-03-10 LAB — TSH: TSH: 1.282 u[IU]/mL (ref 0.350–4.500)

## 2016-03-10 MED ORDER — MAGNESIUM SULFATE 2 GM/50ML IV SOLN
2.0000 g | Freq: Once | INTRAVENOUS | Status: AC
  Administered 2016-03-10: 2 g via INTRAVENOUS
  Filled 2016-03-10: qty 50

## 2016-03-10 MED ORDER — ONDANSETRON HCL 4 MG/2ML IJ SOLN
4.0000 mg | Freq: Four times a day (QID) | INTRAMUSCULAR | Status: DC | PRN
  Administered 2016-03-13 – 2016-03-16 (×2): 4 mg via INTRAVENOUS
  Filled 2016-03-10 (×2): qty 2

## 2016-03-10 MED ORDER — NICOTINE 14 MG/24HR TD PT24
14.0000 mg | MEDICATED_PATCH | Freq: Every day | TRANSDERMAL | Status: DC
Start: 2016-03-10 — End: 2016-03-16
  Administered 2016-03-10 – 2016-03-15 (×7): 14 mg via TRANSDERMAL
  Filled 2016-03-10 (×7): qty 1

## 2016-03-10 MED ORDER — TAMSULOSIN HCL 0.4 MG PO CAPS
0.4000 mg | ORAL_CAPSULE | Freq: Every day | ORAL | Status: DC
  Administered 2016-03-10 – 2016-03-15 (×5): 0.4 mg via ORAL
  Filled 2016-03-10 (×5): qty 1

## 2016-03-10 MED ORDER — HYDROXYZINE HCL 25 MG PO TABS
25.0000 mg | ORAL_TABLET | Freq: Three times a day (TID) | ORAL | Status: DC | PRN
  Filled 2016-03-10: qty 1

## 2016-03-10 MED ORDER — HYDRALAZINE HCL 20 MG/ML IJ SOLN: 10.0000 mg | INTRAMUSCULAR | Status: DC | PRN

## 2016-03-10 MED ORDER — INSULIN DETEMIR 100 UNIT/ML ~~LOC~~ SOLN
30.0000 [IU] | Freq: Every day | SUBCUTANEOUS | Status: DC
Start: 2016-03-10 — End: 2016-03-11
  Administered 2016-03-10: 30 [IU] via SUBCUTANEOUS
  Filled 2016-03-10 (×2): qty 0.3

## 2016-03-10 MED ORDER — DOCUSATE SODIUM 100 MG PO CAPS
100.0000 mg | ORAL_CAPSULE | Freq: Two times a day (BID) | ORAL | Status: DC
Start: 2016-03-10 — End: 2016-03-16
  Administered 2016-03-10 – 2016-03-16 (×13): 100 mg via ORAL
  Filled 2016-03-10 (×14): qty 1

## 2016-03-10 MED ORDER — ASPIRIN 81 MG PO CHEW
81.0000 mg | CHEWABLE_TABLET | Freq: Every day | ORAL | Status: DC
  Administered 2016-03-10 – 2016-03-11 (×2): 81 mg via ORAL
  Filled 2016-03-10 (×2): qty 1

## 2016-03-10 MED ORDER — NEPRO/CARBSTEADY PO LIQD
237.0000 mL | Freq: Three times a day (TID) | ORAL | Status: DC | PRN
  Administered 2016-03-12 – 2016-03-13 (×2): 237 mL via ORAL
  Filled 2016-03-10 (×2): qty 237

## 2016-03-10 MED ORDER — ACETAMINOPHEN 650 MG RE SUPP: 650.0000 mg | Freq: Four times a day (QID) | RECTAL | Status: DC | PRN

## 2016-03-10 MED ORDER — HYDRALAZINE HCL 25 MG PO TABS
25.0000 mg | ORAL_TABLET | Freq: Three times a day (TID) | ORAL | Status: DC
  Administered 2016-03-10 – 2016-03-11 (×3): 25 mg via ORAL
  Filled 2016-03-10 (×3): qty 1

## 2016-03-10 MED ORDER — SORBITOL 70 % SOLN: 30.0000 mL | Status: DC | PRN

## 2016-03-10 MED ORDER — CALCIUM CARBONATE ANTACID 1250 MG/5ML PO SUSP: 500.0000 mg | Freq: Four times a day (QID) | ORAL | Status: DC | PRN

## 2016-03-10 MED ORDER — HEPARIN SODIUM (PORCINE) 5000 UNIT/ML IJ SOLN
5000.0000 [IU] | Freq: Three times a day (TID) | INTRAMUSCULAR | Status: DC
  Administered 2016-03-10 – 2016-03-13 (×9): 5000 [IU] via SUBCUTANEOUS
  Filled 2016-03-10 (×9): qty 1

## 2016-03-10 MED ORDER — DEXTROSE 5 % IV SOLN
100.0000 mg | Freq: Two times a day (BID) | INTRAVENOUS | Status: AC
  Administered 2016-03-10 – 2016-03-11 (×2): 100 mg via INTRAVENOUS
  Filled 2016-03-10 (×2): qty 10

## 2016-03-10 MED ORDER — ENOXAPARIN SODIUM 30 MG/0.3ML ~~LOC~~ SOLN: 30.0000 mg | Freq: Every day | SUBCUTANEOUS | Status: DC

## 2016-03-10 MED ORDER — ACETAMINOPHEN 325 MG PO TABS
650.0000 mg | ORAL_TABLET | Freq: Four times a day (QID) | ORAL | Status: DC | PRN
  Administered 2016-03-10: 650 mg via ORAL
  Filled 2016-03-10: qty 2

## 2016-03-10 MED ORDER — METOPROLOL TARTRATE 25 MG PO TABS
25.0000 mg | ORAL_TABLET | Freq: Two times a day (BID) | ORAL | Status: DC
  Administered 2016-03-10 – 2016-03-16 (×14): 25 mg via ORAL
  Filled 2016-03-10 (×14): qty 1

## 2016-03-10 MED ORDER — SODIUM POLYSTYRENE SULFONATE 15 GM/60ML PO SUSP
15.0000 g | Freq: Once | ORAL | Status: AC
Start: 2016-03-10 — End: 2016-03-10
  Administered 2016-03-10: 15 g via ORAL
  Filled 2016-03-10: qty 60

## 2016-03-10 MED ORDER — CAMPHOR-MENTHOL 0.5-0.5 % EX LOTN: 1.0000 "application " | TOPICAL_LOTION | Freq: Three times a day (TID) | CUTANEOUS | Status: DC | PRN

## 2016-03-10 MED ORDER — FUROSEMIDE 10 MG/ML IJ SOLN
40.0000 mg | Freq: Once | INTRAMUSCULAR | Status: AC
  Administered 2016-03-10: 40 mg via INTRAVENOUS
  Filled 2016-03-10: qty 4

## 2016-03-10 MED ORDER — INSULIN ASPART 100 UNIT/ML ~~LOC~~ SOLN
0.0000 [IU] | Freq: Three times a day (TID) | SUBCUTANEOUS | Status: DC
  Administered 2016-03-10: 5 [IU] via SUBCUTANEOUS
  Administered 2016-03-10: 1 [IU] via SUBCUTANEOUS
  Administered 2016-03-10: 3 [IU] via SUBCUTANEOUS
  Administered 2016-03-11 – 2016-03-12 (×2): 2 [IU] via SUBCUTANEOUS
  Administered 2016-03-13 (×2): 3 [IU] via SUBCUTANEOUS
  Administered 2016-03-14: 2 [IU] via SUBCUTANEOUS
  Administered 2016-03-14 – 2016-03-15 (×3): 1 [IU] via SUBCUTANEOUS
  Administered 2016-03-15 – 2016-03-16 (×2): 3 [IU] via SUBCUTANEOUS

## 2016-03-10 MED ORDER — DOCUSATE SODIUM 283 MG RE ENEM
1.0000 | ENEMA | RECTAL | Status: DC | PRN
  Filled 2016-03-10: qty 1

## 2016-03-10 MED ORDER — INSULIN DETEMIR 100 UNIT/ML ~~LOC~~ SOLN
40.0000 [IU] | Freq: Every day | SUBCUTANEOUS | Status: DC
  Filled 2016-03-10: qty 0.4

## 2016-03-10 MED ORDER — ONDANSETRON HCL 4 MG PO TABS
4.0000 mg | ORAL_TABLET | Freq: Four times a day (QID) | ORAL | Status: DC | PRN
  Administered 2016-03-11 – 2016-03-14 (×2): 4 mg via ORAL
  Filled 2016-03-10 (×4): qty 1

## 2016-03-10 MED ORDER — LEVOTHYROXINE SODIUM 25 MCG PO TABS
25.0000 ug | ORAL_TABLET | Freq: Every day | ORAL | Status: DC
  Administered 2016-03-10 – 2016-03-16 (×7): 25 ug via ORAL
  Filled 2016-03-10 (×7): qty 1

## 2016-03-10 MED ORDER — ZOLPIDEM TARTRATE 5 MG PO TABS
5.0000 mg | ORAL_TABLET | Freq: Every evening | ORAL | Status: DC | PRN
  Administered 2016-03-10 – 2016-03-15 (×4): 5 mg via ORAL
  Filled 2016-03-10 (×4): qty 1

## 2016-03-10 NOTE — Consult Note (Signed)
Alexander Whitaker is a 65 y.o. male with medical history significant of HTN, DM, h/o CVA, restless legs, hypothyroidism, and CKD stage 3 presenting with acute renal failure and volume overload. According to available notes in 08/2014 cr was 1.1, 3/17 cr 1.71, 10/17 cr 2.6.  In Dec 2016 there was 9 gm of protein in a 24 hr collection.  Renal US 03/09/16 revealed R 11,L12, echogenic.  He was scheduled to go to [facility] for renal eval in January.  He is followed by Dr, Carolanne Grumbling.  He was admitted with progressive swelling and SOB.  He has a past hx of urolithiasis.  At [facility] today, BP was 221/94. Labs today reveal a BUN of 61 and cr of 5.11.  Apparently, a month ago there was a blistering rash on his ches and LEs, treated a month ago with steroids.  He has no known retinopathy.   Past Medical History:  Diagnosis Date  . CKD stage 3 due to type 2 diabetes mellitus ([place])   . CVA (cerebral vascular accident) ([place]) 2016  . Diabetes mellitus ([place])   . Hypertension   . Hypothyroidism   . Restless legs    Past Surgical History:  Procedure Laterality Date  . BACK SURGERY    . CIRCUMCISION  2015  . HERNIA REPAIR  2016   Social History:  reports that he has been smoking.  He has a 57.00 pack-year smoking history. He has never used smokeless tobacco. He reports that he drinks alcohol. He reports that he does not use drugs. Allergies: No Known Allergies Family History  Problem Relation Age of Onset  . Leukemia Mother 51  . CVA Father 67    Medications:  Scheduled: . aspirin  81 mg Oral Daily  . docusate sodium  100 mg Oral BID  . heparin subcutaneous  5,000 Units Subcutaneous Q8H  . insulin aspart  0-9 Units Subcutaneous TID WC  . insulin detemir  30 Units Subcutaneous QHS  . levothyroxine  25 mcg Oral QAC breakfast  . metoprolol tartrate  25 mg Oral BID  . nicotine  14 mg Transdermal Daily  . tamsulosin  0.4 mg Oral QPC supper     ROS: as per HPI Blood pressure (!) 172/69, pulse 69,  temperature 98.5 F (36.9 C), temperature source Oral, resp. rate 18, SpO2 98 %.  General appearance: alert and cooperative Head: Normocephalic, without obvious abnormality, atraumatic Eyes: fundi not examined Ears: normal TM's and external ear canals both ears Nose: Nares normal. Septum midline. Mucosa normal. No drainage or sinus tenderness. Throat: lips, mucosa, and tongue normal; teeth and gums normal Resp: clear to auscultation bilaterally Chest wall: no tenderness Cardio: regular rate and rhythm, S1, S2 normal, no murmur, click, rub or gallop GI: 1-2+ edema Extremities: edema 1-2+ Neurologic: NF Results for orders placed or performed during the hospital encounter of 03/09/16 (from the past 48 hour(s))  Comprehensive metabolic panel     Status: Abnormal   Collection Time: 03/10/16  4:26 AM  Result Value Ref Range   Sodium 134 (L) 135 - 145 mmol/L   Potassium 5.3 (H) 3.5 - 5.1 mmol/L   Chloride 106 101 - 111 mmol/L   CO2 18 (L) 22 - 32 mmol/L   Glucose, Bld 360 (H) 65 - 99 mg/dL   BUN 61 (H) 6 - 20 mg/dL   Creatinine, Ser 5.11 (H) 0.61 - 1.24 mg/dL   Calcium 7.4 (L) 8.9 - 10.3 mg/dL   Total Protein 5.4 (L) 6.5 -  8.1 g/dL   Albumin 2.3 (L) 3.5 - 5.0 g/dL   AST 13 (L) 15 - 41 U/L   ALT 13 (L) 17 - 63 U/L   Alkaline Phosphatase 74 38 - 126 U/L   Total Bilirubin 0.2 (L) 0.3 - 1.2 mg/dL   GFR calc non Af Amer 11 (L) >60 mL/min   GFR calc Af Amer 12 (L) >60 mL/min    Comment: (NOTE) The eGFR has been calculated using the CKD EPI equation. This calculation has not been validated in all clinical situations. eGFR's persistently <60 mL/min signify possible Chronic Kidney Disease.    Anion gap 10 5 - 15  Magnesium     Status: Abnormal   Collection Time: 03/10/16  4:26 AM  Result Value Ref Range   Magnesium 1.6 (L) 1.7 - 2.4 mg/dL  Phosphorus     Status: Abnormal   Collection Time: 03/10/16  4:26 AM  Result Value Ref Range   Phosphorus 5.5 (H) 2.5 - 4.6 mg/dL  CBC WITH  DIFFERENTIAL     Status: Abnormal   Collection Time: 03/10/16  4:26 AM  Result Value Ref Range   WBC 7.0 4.0 - 10.5 K/uL   RBC 2.63 (L) 4.22 - 5.81 MIL/uL   Hemoglobin 8.0 (L) 13.0 - 17.0 g/dL   HCT 23.3 (L) 39.0 - 52.0 %   MCV 88.6 78.0 - 100.0 fL   MCH 30.4 26.0 - 34.0 pg   MCHC 34.3 30.0 - 36.0 g/dL   RDW 13.2 11.5 - 15.5 %   Platelets 202 150 - 400 K/uL   Neutrophils Relative % 90 %   Neutro Abs 6.3 1.7 - 7.7 K/uL   Lymphocytes Relative 7 %   Lymphs Abs 0.5 (L) 0.7 - 4.0 K/uL   Monocytes Relative 3 %   Monocytes Absolute 0.2 0.1 - 1.0 K/uL   Eosinophils Relative 0 %   Eosinophils Absolute 0.0 0.0 - 0.7 K/uL   Basophils Relative 0 %   Basophils Absolute 0.0 0.0 - 0.1 K/uL  TSH     Status: None   Collection Time: 03/10/16  4:26 AM  Result Value Ref Range   TSH 1.282 0.350 - 4.500 uIU/mL    Comment: Performed by a 3rd Generation assay with a functional sensitivity of <=0.01 uIU/mL.  Glucose, capillary     Status: Abnormal   Collection Time: 03/10/16  8:16 AM  Result Value Ref Range   Glucose-Capillary 290 (H) 65 - 99 mg/dL  Glucose, capillary     Status: Abnormal   Collection Time: 03/10/16 12:18 PM  Result Value Ref Range   Glucose-Capillary 218 (H) 65 - 99 mg/dL   US Renal  Result Date: 03/10/2016 CLINICAL DATA:  65 year old male with acute on chronic kidney disease. EXAM: RENAL / URINARY TRACT ULTRASOUND COMPLETE COMPARISON:  Renal ultrasound dated 10/09/2015 and CT of the abdomen pelvis dated 02/02/2015 FINDINGS: Right Kidney: Length: 11 cm. Mild increased echogenicity. No hydronephrosis or echogenic stone. Left Kidney: Length: 12 cm. Mild increased echogenicity. No hydronephrosis or echogenic stone. Bladder: Appears normal for degree of bladder distention. IMPRESSION: Upper limits of normal echogenicity may be related to chronic kidney disease. No hydronephrosis or echogenic stone. Electronically Signed   By: Anner Crete M.D.   On: 03/10/2016 04:12     Assessment:  1 AKI, ?DM, HSP, other GNs 2 CKD ? DM 3 Volume overload, pob nephrotic syndrome 4 Anemia, prob chronic dz 5 HBP, suboptimal control  Plan: 1 UA, u pro/cr 2  ANA, Complement, anti-GBM, ANCA, SPEP 3 diurese 4 gradual reduction in BP 5 iron studies and prob ESA tx 6 Possible renal biopsy   Pammy Vesey C 03/10/2016, 4:53 PM

## 2016-03-10 NOTE — Consult Note (Signed)
[facility] CM Primary Care Navigator  03/10/2016  Alexander Whitaker May 30, 1950 [id number]  Met with patient at the bedside to identify possible discharge needs. Patient reports having trouble breathing and feet swelling that led to this admission. Per MD note, he was admitted for evaluation of acute renal failure associated with shortness of breath.   Patient endorses Alexander Whitaker with [facility] as the primary care provider.    Patient shared using [facility] in [place] to obtain medications without difficulty.   Patient manages his medications with Alexander's assistance (vision slightly impaired due to cataract per patient). Has schedule for cataract surgery on 2/18 as reported.  Patient mentioned being able to drive prior to admission. Alexander Whitaker) will provide transportation to his doctors' appointments if needed.  Alexander is his primary caregiver at home as stated.   Discharge plan is home with Alexander's assistance per patient.  Patient voiced understanding to call primary care provider's office, once he gets back home for a post discharge follow-up appointment within a week or sooner if needs arise. Patient letter provided as a reminder.  Patient acknowledged possible needs for disease management related to DM and HF but he declined [facility] care management services offered. He further states "I know I'm suppose to be watching my diet but I'll eat what I want". [facility] care management contact information provided for future needs should he change his mind.   For additional questions please contact:  Alexander Whitaker, BSN, RN-BC [facility] PRIMARY CARE Navigator Cell: [phone]

## 2016-03-10 NOTE — Progress Notes (Signed)
PROGRESS NOTE    Alexander Whitaker  IPJ:[id number] DOB: [birth year] DOA: 03/09/2016 PCP: [name], MD    Brief Narrative:   Alexander Whitaker is a 65 y.o. male with medical history significant of HTN, DM, h/o CVA, restless legs, hypothyroidism, and CKD stage 3 presenting with acute renal failure. He was transferred from [facility].    Assessment & Plan:   Principal Problem:   Acute renal failure superimposed on chronic kidney disease ([place]) Active Problems:   Acute diastolic congestive heart failure (HCC)   Diabetes mellitus, type 2 (HCC)   Malignant hypertension   Hypothyroidism   Tobacco use disorder   Acute renal failure (ARF) (HCC)   Acute on Stage 3 CKD:  Suspect worsening of CKD.  Nephrology consulted and recommendations given.  US renal does not show any obstruction.  UA no infection.     Acute on chronic diastolic heart failure:  Probably from worsening of renal function.  Was given a dose of lasix yesterday.  Monitor , strict intake and output.    Hyperkalemia:  Kayexalate,  Repeat K in am.    Hypomagnesemia: repleted.   Accelerated hypertension:  Improved.    H/o CVA: No new deficits.    Hypothyroidism: Resume synthroid.      DVT prophylaxis: heparin.  Code Status: (Full) Family Communication: wife at bedside.  Disposition Plan: pending further eval.    Consultants:   Nephrology    Procedures: US renal.    Antimicrobials: (none.    Subjective: Has leg pain.   Objective: Vitals:   03/09/16 2157 03/10/16 0500 03/10/16 1000  BP: (!) 184/73 (!) 171/72 (!) 172/69  Pulse: 80 74 69  Resp: 18 18   Temp: 98.8 F (37.1 C) 98.5 F (36.9 C)   TempSrc: Oral Oral   SpO2: 100% 98%     Intake/Output Summary (Last 24 hours) at 03/10/16 1857 Last data filed at 03/10/16 1545  Gross per 24 hour  Intake              840 ml  Output              350 ml  Net              490 ml   There were no vitals filed for this  visit.  Examination:  General exam: Appears calm and comfortable  Respiratory system: Clear to auscultation. Respiratory effort normal. Cardiovascular system: S1 & S2 heard, RRR. No JVD, murmurs, rubs, gallops or clicks. . Gastrointestinal system: Abdomen is nondistended, soft and nontender. No organomegaly or masses felt. Normal bowel sounds heard. Central nervous system: Alert and oriented. No focal neurological deficits. Extremities: 2+ leg edema.       Data Reviewed: I have personally reviewed following labs and imaging studies  CBC:  Recent Labs Lab 03/10/16 0426  WBC 7.0  NEUTROABS 6.3  HGB 8.0*  HCT 23.3*  MCV 88.6  PLT 734   Basic Metabolic Panel:  Recent Labs Lab 03/10/16 0426  NA 134*  K 5.3*  CL 106  CO2 18*  GLUCOSE 360*  BUN 61*  CREATININE 5.11*  CALCIUM 7.4*  MG 1.6*  PHOS 5.5*   GFR: CrCl cannot be calculated (Unknown ideal weight.). Liver Function Tests:  Recent Labs Lab 03/10/16 0426  AST 13*  ALT 13*  ALKPHOS 74  BILITOT 0.2*  PROT 5.4*  ALBUMIN 2.3*   No results for input(s): LIPASE, AMYLASE in the last 168 hours. No results for input(s):  AMMONIA in the last 168 hours. Coagulation Profile: No results for input(s): INR, PROTIME in the last 168 hours. Cardiac Enzymes: No results for input(s): CKTOTAL, CKMB, CKMBINDEX, TROPONINI in the last 168 hours. BNP (last 3 results) No results for input(s): PROBNP in the last 8760 hours. HbA1C: No results for input(s): HGBA1C in the last 72 hours. CBG:  Recent Labs Lab 03/10/16 0816 03/10/16 1218 03/10/16 1748  GLUCAP 290* 218* 129*   Lipid Profile: No results for input(s): CHOL, HDL, LDLCALC, TRIG, CHOLHDL, LDLDIRECT in the last 72 hours. Thyroid Function Tests:  Recent Labs  03/10/16 0426  TSH 1.282   Anemia Panel: No results for input(s): VITAMINB12, FOLATE, FERRITIN, TIBC, IRON, RETICCTPCT in the last 72 hours. Sepsis Labs: No results for input(s): PROCALCITON,  LATICACIDVEN in the last 168 hours.  No results found for this or any previous visit (from the past 240 hour(s)).       Radiology Studies: US Renal  Result Date: 03/10/2016 CLINICAL DATA:  65 year old male with acute on chronic kidney disease. EXAM: RENAL / URINARY TRACT ULTRASOUND COMPLETE COMPARISON:  Renal ultrasound dated 10/09/2015 and CT of the abdomen pelvis dated 02/02/2015 FINDINGS: Right Kidney: Length: 11 cm. Mild increased echogenicity. No hydronephrosis or echogenic stone. Left Kidney: Length: 12 cm. Mild increased echogenicity. No hydronephrosis or echogenic stone. Bladder: Appears normal for degree of bladder distention. IMPRESSION: Upper limits of normal echogenicity may be related to chronic kidney disease. No hydronephrosis or echogenic stone. Electronically Signed   By: Anner Crete M.D.   On: 03/10/2016 04:12        Scheduled Meds: . aspirin  81 mg Oral Daily  . docusate sodium  100 mg Oral BID  . furosemide  100 mg Intravenous Q12H  . heparin subcutaneous  5,000 Units Subcutaneous Q8H  . insulin aspart  0-9 Units Subcutaneous TID WC  . insulin detemir  30 Units Subcutaneous QHS  . levothyroxine  25 mcg Oral QAC breakfast  . metoprolol tartrate  25 mg Oral BID  . nicotine  14 mg Transdermal Daily  . tamsulosin  0.4 mg Oral QPC supper   Continuous Infusions:   LOS: 1 day    Time spent: 30 minutes.     Hosie Poisson, MD [facility] Pager [phone]  If 7PM-7AM, please contact night-coverage [email or web address] Password [facility] 03/10/2016, 6:57 PM

## 2016-03-10 NOTE — Progress Notes (Signed)
Pt refused stool softner colace

## 2016-03-11 ENCOUNTER — Inpatient Hospital Stay (HOSPITAL_COMMUNITY): Payer: Commercial Managed Care - HMO

## 2016-03-11 ENCOUNTER — Encounter (HOSPITAL_COMMUNITY): Payer: Self-pay | Admitting: General Surgery

## 2016-03-11 LAB — CBC
HCT: 27 % — ABNORMAL LOW (ref 39.0–52.0)
Hemoglobin: 9.3 g/dL — ABNORMAL LOW (ref 13.0–17.0)
MCH: 31.2 pg (ref 26.0–34.0)
MCHC: 34.4 g/dL (ref 30.0–36.0)
MCV: 90.6 fL (ref 78.0–100.0)
Platelets: 232 10*3/uL (ref 150–400)
RBC: 2.98 MIL/uL — AB (ref 4.22–5.81)
RDW: 13.5 % (ref 11.5–15.5)
WBC: 12.7 10*3/uL — ABNORMAL HIGH (ref 4.0–10.5)

## 2016-03-11 LAB — HEMOGLOBIN A1C
Hgb A1c MFr Bld: 6.4 % — ABNORMAL HIGH (ref 4.8–5.6)
Mean Plasma Glucose: 137 mg/dL

## 2016-03-11 LAB — BASIC METABOLIC PANEL
Anion gap: 10 (ref 5–15)
Anion gap: 11 (ref 5–15)
BUN: 60 mg/dL — ABNORMAL HIGH (ref 6–20)
BUN: 59 mg/dL — AB (ref 6–20)
CHLORIDE: 111 mmol/L (ref 101–111)
CHLORIDE: 108 mmol/L (ref 101–111)
CO2: 19 mmol/L — AB (ref 22–32)
CO2: 19 mmol/L — ABNORMAL LOW (ref 22–32)
CREATININE: 5.22 mg/dL — AB (ref 0.61–1.24)
Calcium: 7.9 mg/dL — ABNORMAL LOW (ref 8.9–10.3)
Calcium: 7.6 mg/dL — ABNORMAL LOW (ref 8.9–10.3)
Creatinine, Ser: 5.21 mg/dL — ABNORMAL HIGH (ref 0.61–1.24)
GFR calc Af Amer: 12 mL/min — ABNORMAL LOW (ref >60)
GFR calc Af Amer: 12 mL/min — ABNORMAL LOW (ref >60)
GFR calc non Af Amer: 10 mL/min — ABNORMAL LOW (ref >60)
GFR calc non Af Amer: 10 mL/min — ABNORMAL LOW (ref >60)
GLUCOSE: 172 mg/dL — AB (ref 65–99)
Glucose, Bld: 62 mg/dL — ABNORMAL LOW (ref 65–99)
POTASSIUM: 4.2 mmol/L (ref 3.5–5.1)
Potassium: 4.1 mmol/L (ref 3.5–5.1)
Sodium: 140 mmol/L (ref 135–145)
Sodium: 138 mmol/L (ref 135–145)

## 2016-03-11 LAB — GLUCOSE, CAPILLARY
GLUCOSE-CAPILLARY: 50 mg/dL — AB (ref 65–99)
Glucose-Capillary: 77 mg/dL (ref 65–99)
Glucose-Capillary: 168 mg/dL — ABNORMAL HIGH (ref 65–99)
Glucose-Capillary: 94 mg/dL (ref 65–99)
Glucose-Capillary: 207 mg/dL — ABNORMAL HIGH (ref 65–99)

## 2016-03-11 LAB — FERRITIN: Ferritin: 222 ng/mL (ref 24–336)

## 2016-03-11 LAB — IRON AND TIBC
IRON: 46 ug/dL (ref 45–182)
SATURATION RATIOS: 22 % (ref 17.9–39.5)
TIBC: 207 ug/dL — AB (ref 250–450)
UIBC: 161 ug/dL

## 2016-03-11 MED ORDER — ROPINIROLE HCL 1 MG PO TABS
1.0000 mg | ORAL_TABLET | Freq: Every day | ORAL | Status: DC
  Administered 2016-03-11 – 2016-03-15 (×5): 1 mg via ORAL
  Filled 2016-03-11 (×5): qty 1

## 2016-03-11 MED ORDER — HYDRALAZINE HCL 20 MG/ML IJ SOLN: 10.0000 mg | INTRAMUSCULAR | Status: DC | PRN

## 2016-03-11 MED ORDER — INSULIN DETEMIR 100 UNIT/ML ~~LOC~~ SOLN
15.0000 [IU] | Freq: Every day | SUBCUTANEOUS | Status: DC
  Administered 2016-03-11: 15 [IU] via SUBCUTANEOUS
  Filled 2016-03-11 (×2): qty 0.15

## 2016-03-11 MED ORDER — HYDRALAZINE HCL 50 MG PO TABS
50.0000 mg | ORAL_TABLET | Freq: Three times a day (TID) | ORAL | Status: DC
  Administered 2016-03-11 – 2016-03-12 (×2): 50 mg via ORAL
  Filled 2016-03-11 (×2): qty 1

## 2016-03-11 MED ORDER — INSULIN DETEMIR 100 UNIT/ML ~~LOC~~ SOLN: 20.0000 [IU] | Freq: Every day | SUBCUTANEOUS | Status: DC

## 2016-03-11 MED ORDER — HEPARIN SODIUM (PORCINE) 1000 UNIT/ML IJ SOLN: 5000.0000 [IU] | Freq: Three times a day (TID) | INTRAMUSCULAR | Status: DC

## 2016-03-11 MED ORDER — FUROSEMIDE 10 MG/ML IJ SOLN
80.0000 mg | Freq: Three times a day (TID) | INTRAMUSCULAR | Status: DC
  Administered 2016-03-11 – 2016-03-12 (×6): 80 mg via INTRAVENOUS
  Filled 2016-03-11 (×7): qty 8

## 2016-03-11 MED ORDER — CLONIDINE HCL 0.1 MG PO TABS
0.1000 mg | ORAL_TABLET | Freq: Two times a day (BID) | ORAL | Status: DC
  Administered 2016-03-11 – 2016-03-12 (×4): 0.1 mg via ORAL
  Filled 2016-03-11 (×5): qty 1

## 2016-03-11 NOTE — Progress Notes (Signed)
PROGRESS NOTE    Alexander Whitaker  WLS:[id number] DOB: 05-16-1950 DOA: 03/09/2016 PCP: [name], MD    Brief Narrative:   Alexander Whitaker is a 65 y.o. male with medical history significant of HTN, DM, h/o CVA, restless legs, hypothyroidism, and CKD stage 3 presenting with acute renal failure. He was transferred from [facility].    Assessment & Plan:   Principal Problem:   Acute renal failure superimposed on chronic kidney disease ([place]) Active Problems:   Acute diastolic congestive heart failure (HCC)   Diabetes mellitus, type 2 (HCC)   Malignant hypertension   Hypothyroidism   Tobacco use disorder   Acute renal failure (ARF) (HCC)   Acute on Stage 3 CKD:  Suspect worsening of CKD/ DM and uncontrolled hypertension.  Nephrology consulted and recommendations given.  US renal does not show any obstruction.  UA no infection.  Nephrotic syndrome.  Will follow up serologies, SPEP pending.  Scheduled for renal biopsy on 12/18. Discussed with the patient.  Avoid nephrotoxins.     Acute on chronic diastolic heart failure:  Probably from worsening of renal function.  On lasix.  Monitor , strict intake and output.   Intake/Output Summary (Last 24 hours) at 03/11/16 1627 Last data filed at 03/11/16 1114  Gross per 24 hour  Intake             1020 ml  Output              800 ml  Net              220 ml      Hyperkalemia:  Kayexalate, given Repeat K in am is normal.    Hypomagnesemia: repleted.   Accelerated hypertension:  Improved. Added his home meds  Like clonidine, hydralazine .    H/o CVA: No new deficits.    Hypothyroidism: Resume synthroid.  Diabetes mellitus: Well controlled. hgba1c is 6.4, and he had an episode of hypoglycemia this am.  Decreased the long acting to 15 units at bedtime.  CBG (last 3)   Recent Labs  03/11/16 0649 03/11/16 0711 03/11/16 1202  GLUCAP 50* 77 168*    Sob and cough: repeat CXR shows no pulmonary  edema.    Anemia ; normocytic, suspect anemia of chronic disease. Iron studies done.    DVT prophylaxis: heparin.  Code Status: (Full) Family Communication: wife at bedside.  Disposition Plan: pending further eval.    Consultants:   Nephrology    Procedures: US renal.    Antimicrobials: (none.    Subjective: Reports cough and sob.   Objective: Vitals:   03/11/16 0627 03/11/16 1013 03/11/16 1258 03/11/16 1411  BP: (!) 174/71 (!) 181/60 (!) 199/74 (!) 192/76  Pulse: 72 70  70  Resp: 18     Temp: 98.2 F (36.8 C)     TempSrc: Oral     SpO2: 94%       Intake/Output Summary (Last 24 hours) at 03/11/16 1626 Last data filed at 03/11/16 1114  Gross per 24 hour  Intake             1020 ml  Output              800 ml  Net              220 ml   There were no vitals filed for this visit.  Examination:  General exam: Appears calm and comfortable  Respiratory system: Clear to auscultation. Respiratory effort normal.  Cardiovascular system: S1 & S2 heard, RRR. No JVD, murmurs, rubs, gallops or clicks. . Gastrointestinal system: Abdomen is nondistended, soft and nontender. No organomegaly or masses felt. Normal bowel sounds heard. Central nervous system: Alert and oriented. No focal neurological deficits. Extremities: 2+ leg edema.       Data Reviewed: I have personally reviewed following labs and imaging studies  CBC:  Recent Labs Lab 03/10/16 0426 03/11/16 0412  WBC 7.0 12.7*  NEUTROABS 6.3  --   HGB 8.0* 9.3*  HCT 23.3* 27.0*  MCV 88.6 90.6  PLT 202 841   Basic Metabolic Panel:  Recent Labs Lab 03/10/16 0426 03/11/16 0412 03/11/16 1154  NA 134* 140 138  K 5.3* 4.1 4.2  CL 106 111 108  CO2 18* 19* 19*  GLUCOSE 360* 62* 172*  BUN 61* 60* 59*  CREATININE 5.11* 5.22* 5.21*  CALCIUM 7.4* 7.9* 7.6*  MG 1.6*  --   --   PHOS 5.5*  --   --    GFR: CrCl cannot be calculated (Unknown ideal weight.). Liver Function Tests:  Recent Labs Lab  03/10/16 0426  AST 13*  ALT 13*  ALKPHOS 74  BILITOT 0.2*  PROT 5.4*  ALBUMIN 2.3*   No results for input(s): LIPASE, AMYLASE in the last 168 hours. No results for input(s): AMMONIA in the last 168 hours. Coagulation Profile: No results for input(s): INR, PROTIME in the last 168 hours. Cardiac Enzymes: No results for input(s): CKTOTAL, CKMB, CKMBINDEX, TROPONINI in the last 168 hours. BNP (last 3 results) No results for input(s): PROBNP in the last 8760 hours. HbA1C:  Recent Labs  03/10/16 0426  HGBA1C 6.4*   CBG:  Recent Labs Lab 03/10/16 1748 03/10/16 2235 03/11/16 0649 03/11/16 0711 03/11/16 1202  GLUCAP 129* 135* 50* 77 168*   Lipid Profile: No results for input(s): CHOL, HDL, LDLCALC, TRIG, CHOLHDL, LDLDIRECT in the last 72 hours. Thyroid Function Tests:  Recent Labs  03/10/16 0426  TSH 1.282   Anemia Panel:  Recent Labs  03/11/16 0412  FERRITIN 222  TIBC 207*  IRON 46   Sepsis Labs: No results for input(s): PROCALCITON, LATICACIDVEN in the last 168 hours.  No results found for this or any previous visit (from the past 240 hour(s)).       Radiology Studies: Dg Chest 2 View  Result Date: 03/11/2016 CLINICAL DATA:  Shortness of Breath EXAM: CHEST  2 VIEW COMPARISON:  03/09/2016 FINDINGS: Cardiomediastinal silhouette is unremarkable. There is small left pleural effusion with left basilar atelectasis or infiltrate. No pulmonary edema. Osteopenia and mild degenerative changes lower thoracic spine. IMPRESSION: Small left pleural effusion with left basilar atelectasis or infiltrate. No pulmonary edema. Electronically Signed   By: Lahoma Crocker M.D.   On: 03/11/2016 11:35   US Renal  Result Date: 03/10/2016 CLINICAL DATA:  65 year old male with acute on chronic kidney disease. EXAM: RENAL / URINARY TRACT ULTRASOUND COMPLETE COMPARISON:  Renal ultrasound dated 10/09/2015 and CT of the abdomen pelvis dated 02/02/2015 FINDINGS: Right Kidney: Length: 11  cm. Mild increased echogenicity. No hydronephrosis or echogenic stone. Left Kidney: Length: 12 cm. Mild increased echogenicity. No hydronephrosis or echogenic stone. Bladder: Appears normal for degree of bladder distention. IMPRESSION: Upper limits of normal echogenicity may be related to chronic kidney disease. No hydronephrosis or echogenic stone. Electronically Signed   By: Anner Crete M.D.   On: 03/10/2016 04:12        Scheduled Meds: . cloNIDine  0.1 mg Oral BID  .  docusate sodium  100 mg Oral BID  . furosemide  80 mg Intravenous TID  . heparin subcutaneous  5,000 Units Subcutaneous Q8H  . [START ON 03/15/2016] heparin  5,000 Units Intravenous TID  . hydrALAZINE  50 mg Oral Q8H  . insulin aspart  0-9 Units Subcutaneous TID WC  . insulin detemir  15 Units Subcutaneous QHS  . levothyroxine  25 mcg Oral QAC breakfast  . metoprolol tartrate  25 mg Oral BID  . nicotine  14 mg Transdermal Daily  . rOPINIRole  1 mg Oral QHS  . tamsulosin  0.4 mg Oral QPC supper   Continuous Infusions:   LOS: 2 days    Time spent: 30 minutes.     Hosie Poisson, MD [facility] Pager [phone]  If 7PM-7AM, please contact night-coverage [email or web address] Password [facility] 03/11/2016, 4:26 PM

## 2016-03-11 NOTE — Progress Notes (Signed)
Pt with complaint of sob and flutters. Provider Karleen Hampshire notified. Pt to get chest x-ray. Will continue to monitor pt.

## 2016-03-11 NOTE — Progress Notes (Signed)
Notified Baltazar Najjar, NP that pt states that he has been having a thumping in his chest since 0430 this morning. Pt states doesn't feel good and feels his CBG is low. Checked CBG is 50.  Gave carb snack. Repeat CBG is 77. No new orders given. Pt states he feels much better now and thumping in chest is gone. Will continue to monitor pt. Ranelle Oyster, RN

## 2016-03-11 NOTE — Consult Note (Signed)
Chief Complaint: HTN, CKD, proteinuria  Referring Physician:Dr. Pearson Grippe   Supervising Physician: Corrie Mckusick  Patient Status: [facility] - In-pt  HPI: Alexander Whitaker is an 65 y.o. male with a history of CKD followed by Dr. Jonnie Finner, DM, CVA, HTN who has been having increasing LE edema over the last month with new onset SOB.  He also developed a rash that is improving.  He went to [facility] where he was found to have elevated BP of 224/91.  He was admitted and transferred here to [facility].  He has been found to have a creatinine in the 5s along with protein in his urine.  We have been asked to see him for a random renal biopsy to help discern what is causing all of these issues.  Past Medical History:  Past Medical History:  Diagnosis Date  . CKD stage 3 due to type 2 diabetes mellitus ([place])   . CVA (cerebral vascular accident) ([place]) 2016  . Diabetes mellitus ([place])   . Hypertension   . Hypothyroidism   . Restless legs     Past Surgical History:  Past Surgical History:  Procedure Laterality Date  . BACK SURGERY    . CIRCUMCISION  2015  . HERNIA REPAIR  2016    Family History:  Family History  Problem Relation Age of Onset  . Leukemia Mother 12  . CVA Father 61    Social History:  reports that he has been smoking.  He has a 57.00 pack-year smoking history. He has never used smokeless tobacco. He reports that he drinks alcohol. He reports that he does not use drugs.  Allergies: No Known Allergies  Medications: Medications reviewed in Epic  Please HPI for pertinent positives, otherwise complete 10 system ROS negative.  Mallampati Score: MD Evaluation Airway: WNL Heart: WNL Abdomen: WNL Chest/ Lungs: WNL ASA  Classification: 3 Mallampati/Airway Score: Two  Physical Exam: BP (!) 181/60 (BP Location: Left Arm)   Pulse 70   Temp 98.2 F (36.8 C) (Oral)   Resp 18   SpO2 94%  There is no height or weight on file to calculate BMI. General: pleasant, WD, WN white male who is  laying in bed in NAD HEENT: head is normocephalic, atraumatic.  Sclera are noninjected.  PERRL.  Ears and nose without any masses or lesions.  Mouth is pink and moist Heart: regular, rate, and rhythm.  Normal s1,s2. No obvious murmurs, gallops, or rubs noted.  Palpable radial and pedal pulses bilaterally Lungs: CTAB, no wheezes, rhonchi, or rales noted.  Respiratory effort nonlabored Abd: soft, NT, ND, +BS, no masses, hernias, or organomegaly MS: all 4 extremities are symmetrical with no cyanosis, clubbing, but he has +2 pitting edema in his lower extremities Psych: A&Ox3 with an appropriate affect.   Labs: Results for orders placed or performed during the hospital encounter of 03/09/16 (from the past 48 hour(s))  Hemoglobin A1c     Status: Abnormal   Collection Time: 03/10/16  4:26 AM  Result Value Ref Range   Hgb A1c MFr Bld 6.4 (H) 4.8 - 5.6 %    Comment: (NOTE)         Pre-diabetes: 5.7 - 6.4         Diabetes: >6.4         Glycemic control for adults with diabetes: <7.0    Mean Plasma Glucose 137 mg/dL    Comment: (NOTE) Performed At: [facility] [address] [place], [place] [id number] Lindon Romp  MD KW:[id number]   Comprehensive metabolic panel     Status: Abnormal   Collection Time: 03/10/16  4:26 AM  Result Value Ref Range   Sodium 134 (L) 135 - 145 mmol/L   Potassium 5.3 (H) 3.5 - 5.1 mmol/L   Chloride 106 101 - 111 mmol/L   CO2 18 (L) 22 - 32 mmol/L   Glucose, Bld 360 (H) 65 - 99 mg/dL   BUN 61 (H) 6 - 20 mg/dL   Creatinine, Ser 5.11 (H) 0.61 - 1.24 mg/dL   Calcium 7.4 (L) 8.9 - 10.3 mg/dL   Total Protein 5.4 (L) 6.5 - 8.1 g/dL   Albumin 2.3 (L) 3.5 - 5.0 g/dL   AST 13 (L) 15 - 41 U/L   ALT 13 (L) 17 - 63 U/L   Alkaline Phosphatase 74 38 - 126 U/L   Total Bilirubin 0.2 (L) 0.3 - 1.2 mg/dL   GFR calc non Af Amer 11 (L) >60 mL/min   GFR calc Af Amer 12 (L) >60 mL/min    Comment: (NOTE) The eGFR has been calculated using the CKD EPI equation. This  calculation has not been validated in all clinical situations. eGFR's persistently <60 mL/min signify possible Chronic Kidney Disease.    Anion gap 10 5 - 15  Magnesium     Status: Abnormal   Collection Time: 03/10/16  4:26 AM  Result Value Ref Range   Magnesium 1.6 (L) 1.7 - 2.4 mg/dL  Phosphorus     Status: Abnormal   Collection Time: 03/10/16  4:26 AM  Result Value Ref Range   Phosphorus 5.5 (H) 2.5 - 4.6 mg/dL  CBC WITH DIFFERENTIAL     Status: Abnormal   Collection Time: 03/10/16  4:26 AM  Result Value Ref Range   WBC 7.0 4.0 - 10.5 K/uL   RBC 2.63 (L) 4.22 - 5.81 MIL/uL   Hemoglobin 8.0 (L) 13.0 - 17.0 g/dL   HCT 23.3 (L) 39.0 - 52.0 %   MCV 88.6 78.0 - 100.0 fL   MCH 30.4 26.0 - 34.0 pg   MCHC 34.3 30.0 - 36.0 g/dL   RDW 13.2 11.5 - 15.5 %   Platelets 202 150 - 400 K/uL   Neutrophils Relative % 90 %   Neutro Abs 6.3 1.7 - 7.7 K/uL   Lymphocytes Relative 7 %   Lymphs Abs 0.5 (L) 0.7 - 4.0 K/uL   Monocytes Relative 3 %   Monocytes Absolute 0.2 0.1 - 1.0 K/uL   Eosinophils Relative 0 %   Eosinophils Absolute 0.0 0.0 - 0.7 K/uL   Basophils Relative 0 %   Basophils Absolute 0.0 0.0 - 0.1 K/uL  TSH     Status: None   Collection Time: 03/10/16  4:26 AM  Result Value Ref Range   TSH 1.282 0.350 - 4.500 uIU/mL    Comment: Performed by a 3rd Generation assay with a functional sensitivity of <=0.01 uIU/mL.  Glucose, capillary     Status: Abnormal   Collection Time: 03/10/16  8:16 AM  Result Value Ref Range   Glucose-Capillary 290 (H) 65 - 99 mg/dL  Glucose, capillary     Status: Abnormal   Collection Time: 03/10/16 12:18 PM  Result Value Ref Range   Glucose-Capillary 218 (H) 65 - 99 mg/dL  Glucose, capillary     Status: Abnormal   Collection Time: 03/10/16  5:48 PM  Result Value Ref Range   Glucose-Capillary 129 (H) 65 - 99 mg/dL  Urinalysis, Routine w reflex microscopic  Status: Abnormal   Collection Time: 03/10/16  7:05 PM  Result Value Ref Range   Color, Urine  STRAW (A) YELLOW   APPearance CLEAR CLEAR   Specific Gravity, Urine 1.011 1.005 - 1.030   pH 6.0 5.0 - 8.0   Glucose, UA >=500 (A) NEGATIVE mg/dL   Hgb urine dipstick SMALL (A) NEGATIVE   Bilirubin Urine NEGATIVE NEGATIVE   Ketones, ur NEGATIVE NEGATIVE mg/dL   Protein, ur >=300 (A) NEGATIVE mg/dL   Nitrite NEGATIVE NEGATIVE   Leukocytes, UA NEGATIVE NEGATIVE   RBC / HPF 0-5 0 - 5 RBC/hpf   WBC, UA 0-5 0 - 5 WBC/hpf   Bacteria, UA RARE (A) NONE SEEN   Squamous Epithelial / LPF NONE SEEN NONE SEEN   Mucous PRESENT    Hyaline Casts, UA PRESENT   Protein / creatinine ratio, urine     Status: Abnormal   Collection Time: 03/10/16  7:06 PM  Result Value Ref Range   Creatinine, Urine 46.48 mg/dL   Total Protein, Urine 750 mg/dL    Comment: RESULTS CONFIRMED BY MANUAL DILUTION NO NORMAL RANGE ESTABLISHED FOR THIS TEST    Protein Creatinine Ratio 16.14 (H) 0.00 - 0.15 mg/mg[Cre]  Glucose, capillary     Status: Abnormal   Collection Time: 03/10/16 10:35 PM  Result Value Ref Range   Glucose-Capillary 135 (H) 65 - 99 mg/dL  Iron and TIBC     Status: Abnormal   Collection Time: 03/11/16  4:12 AM  Result Value Ref Range   Iron 46 45 - 182 ug/dL   TIBC 207 (L) 250 - 450 ug/dL   Saturation Ratios 22 17.9 - 39.5 %   UIBC 161 ug/dL  Ferritin     Status: None   Collection Time: 03/11/16  4:12 AM  Result Value Ref Range   Ferritin 222 24 - 336 ng/mL  Basic metabolic panel     Status: Abnormal   Collection Time: 03/11/16  4:12 AM  Result Value Ref Range   Sodium 140 135 - 145 mmol/L   Potassium 4.1 3.5 - 5.1 mmol/L   Chloride 111 101 - 111 mmol/L   CO2 19 (L) 22 - 32 mmol/L   Glucose, Bld 62 (L) 65 - 99 mg/dL   BUN 60 (H) 6 - 20 mg/dL   Creatinine, Ser 5.22 (H) 0.61 - 1.24 mg/dL   Calcium 7.9 (L) 8.9 - 10.3 mg/dL   GFR calc non Af Amer 10 (L) >60 mL/min   GFR calc Af Amer 12 (L) >60 mL/min    Comment: (NOTE) The eGFR has been calculated using the CKD EPI equation. This calculation  has not been validated in all clinical situations. eGFR's persistently <60 mL/min signify possible Chronic Kidney Disease.    Anion gap 10 5 - 15  CBC     Status: Abnormal   Collection Time: 03/11/16  4:12 AM  Result Value Ref Range   WBC 12.7 (H) 4.0 - 10.5 K/uL   RBC 2.98 (L) 4.22 - 5.81 MIL/uL   Hemoglobin 9.3 (L) 13.0 - 17.0 g/dL   HCT 27.0 (L) 39.0 - 52.0 %   MCV 90.6 78.0 - 100.0 fL   MCH 31.2 26.0 - 34.0 pg   MCHC 34.4 30.0 - 36.0 g/dL   RDW 13.5 11.5 - 15.5 %   Platelets 232 150 - 400 K/uL  Glucose, capillary     Status: Abnormal   Collection Time: 03/11/16  6:49 AM  Result Value Ref Range  Glucose-Capillary 50 (L) 65 - 99 mg/dL  Glucose, capillary     Status: None   Collection Time: 03/11/16  7:11 AM  Result Value Ref Range   Glucose-Capillary 77 65 - 99 mg/dL    Imaging: Dg Chest 2 View  Result Date: 03/11/2016 CLINICAL DATA:  Shortness of Breath EXAM: CHEST  2 VIEW COMPARISON:  03/09/2016 FINDINGS: Cardiomediastinal silhouette is unremarkable. There is small left pleural effusion with left basilar atelectasis or infiltrate. No pulmonary edema. Osteopenia and mild degenerative changes lower thoracic spine. IMPRESSION: Small left pleural effusion with left basilar atelectasis or infiltrate. No pulmonary edema. Electronically Signed   By: Lahoma Crocker M.D.   On: 03/11/2016 11:35   US Renal  Result Date: 03/10/2016 CLINICAL DATA:  65 year old male with acute on chronic kidney disease. EXAM: RENAL / URINARY TRACT ULTRASOUND COMPLETE COMPARISON:  Renal ultrasound dated 10/09/2015 and CT of the abdomen pelvis dated 02/02/2015 FINDINGS: Right Kidney: Length: 11 cm. Mild increased echogenicity. No hydronephrosis or echogenic stone. Left Kidney: Length: 12 cm. Mild increased echogenicity. No hydronephrosis or echogenic stone. Bladder: Appears normal for degree of bladder distention. IMPRESSION: Upper limits of normal echogenicity may be related to chronic kidney disease. No  hydronephrosis or echogenic stone. Electronically Signed   By: Anner Crete M.D.   On: 03/10/2016 04:12    Assessment/Plan 1. HTN, CKD, proteinuria -we will plan to proceed with a random renal biopsy on Monday -orders have been written for Monday and labs ordered  -BP will need to be under [phone] systolic so we can proceed with bx, so aggressive blood pressure control is mandatory for [place] to proceed -Risks and Benefits discussed with the patient including, but not limited to bleeding, infection, damage to adjacent structures or low yield requiring additional tests. All of the patient's questions were answered, patient is agreeable to proceed. Consent signed and in chart.  Thank you for this interesting consult.  I greatly enjoyed meeting Alexander Whitaker and look forward to participating in their care.  A copy of this report was sent to the requesting provider on this date.  Electronically Signed: Henreitta Cea 03/11/2016, 11:58 AM   I spent a total of 40 Minutes    in face to face in clinical consultation, greater than 50% of which was counseling/coordinating care for chronic kidney disease, HTN, proteinuria

## 2016-03-11 NOTE — Progress Notes (Addendum)
Pt with elevated bp of 192/76. Provider Karleen Hampshire notified. Awaiting further orders. Pt to get catapres po. Will continue  To monitor pt.

## 2016-03-11 NOTE — Progress Notes (Signed)
Hypoglycemic Event  CBG: 50 @ 0649  Treatment: 15 GM carbohydrate snack  Symptoms: Doesn't feel good  Follow-up CBG: [id number] CBG Result:77  Possible Reasons for Event:   Comments/MD notified:Kirby, NP no new orders given    Candace Gallus

## 2016-03-11 NOTE — Progress Notes (Signed)
Admit: 03/09/2016 LOS: 2  52M progressive renal failure, nephrotic syndrome, hypervolemia, HTN urgency  Subjective:  No AM labs  UP/C = 16 Less dyspneic, able to lie flat Still with sig edema, BP UP Discussed this oculd be diabetic but other possiblities as well Pt ok with renal biopsy if needed  12/14 0701 - 12/15 0700 In: 900 [P.O.:840; IV Piggyback:60] Out: 1150 [Urine:1150]  There were no vitals filed for this visit.  Scheduled Meds: . docusate sodium  100 mg Oral BID  . furosemide  80 mg Intravenous TID  . heparin subcutaneous  5,000 Units Subcutaneous Q8H  . [START ON 03/15/2016] heparin  5,000 Units Intravenous TID  . hydrALAZINE  25 mg Oral Q8H  . insulin aspart  0-9 Units Subcutaneous TID WC  . insulin detemir  15 Units Subcutaneous QHS  . levothyroxine  25 mcg Oral QAC breakfast  . metoprolol tartrate  25 mg Oral BID  . nicotine  14 mg Transdermal Daily  . rOPINIRole  1 mg Oral QHS  . tamsulosin  0.4 mg Oral QPC supper   Continuous Infusions: PRN Meds:.acetaminophen **OR** acetaminophen, calcium carbonate (dosed in mg elemental calcium), camphor-menthol **AND** hydrOXYzine, docusate sodium, feeding supplement (NEPRO CARB STEADY), hydrALAZINE, ondansetron **OR** ondansetron (ZOFRAN) IV, sorbitol, zolpidem  Current Labs: reviewed  Renal US 12.14: b/l nl size, echogenic UA with 0-5RBC/HPF, 4+ protein  Physical Exam:  Blood pressure (!) 181/60, pulse 70, temperature 98.2 F (36.8 C), temperature source Oral, resp. rate 18, SpO2 94 %. NAD RRR 3+ LEE S/nt/nd Diminished in the bases Nonfocal AAOx3  A 1. Progressive CKD with Nephrotic Proteinuria 2. DM2 since 1993 no evidence of DPR or DPN 3. HTN 4. Hx/o CVA on ASA $Remo'81mg'LsREY$  daily 5. Blistering Rash 1 month ago  P 1. Labs ordered for today 2. Resume lasix 80 IV BID 3. F/u serologies, check SPEP and sFLC 4. Tentative plan for Renal Bx 12/18; pt agreeable; radiology notified 5. Daily weights, Daily Renal  Panel, Strict I/Os, Avoid nephrotoxins (NSAIDs, judicious IV Contrast) 6. Hold ASA for now   Pearson Grippe MD 03/11/2016, 11:34 AM   Recent Labs Lab 03/10/16 0426 03/11/16 0412  NA 134* 140  K 5.3* 4.1  CL 106 111  CO2 18* 19*  GLUCOSE 360* 62*  BUN 61* 60*  CREATININE 5.11* 5.22*  CALCIUM 7.4* 7.9*  PHOS 5.5*  --     Recent Labs Lab 03/10/16 0426 03/11/16 0412  WBC 7.0 12.7*  NEUTROABS 6.3  --   HGB 8.0* 9.3*  HCT 23.3* 27.0*  MCV 88.6 90.6  PLT 202 232

## 2016-03-12 LAB — BASIC METABOLIC PANEL
Anion gap: 12 (ref 5–15)
BUN: 61 mg/dL — AB (ref 6–20)
CALCIUM: 7.6 mg/dL — AB (ref 8.9–10.3)
CO2: 20 mmol/L — ABNORMAL LOW (ref 22–32)
Chloride: 108 mmol/L (ref 101–111)
Creatinine, Ser: 5.34 mg/dL — ABNORMAL HIGH (ref 0.61–1.24)
GFR calc Af Amer: 12 mL/min — ABNORMAL LOW (ref >60)
GFR, EST NON AFRICAN AMERICAN: 10 mL/min — AB (ref >60)
GLUCOSE: 125 mg/dL — AB (ref 65–99)
Potassium: 4 mmol/L (ref 3.5–5.1)
Sodium: 140 mmol/L (ref 135–145)

## 2016-03-12 LAB — HEMOGLOBIN A1C
Hgb A1c MFr Bld: 6.7 % — ABNORMAL HIGH (ref 4.8–5.6)
MEAN PLASMA GLUCOSE: 146 mg/dL

## 2016-03-12 LAB — GLUCOSE, CAPILLARY
GLUCOSE-CAPILLARY: 65 mg/dL (ref 65–99)
GLUCOSE-CAPILLARY: 197 mg/dL — AB (ref 65–99)
Glucose-Capillary: 200 mg/dL — ABNORMAL HIGH (ref 65–99)
Glucose-Capillary: 163 mg/dL — ABNORMAL HIGH (ref 65–99)

## 2016-03-12 LAB — C3 COMPLEMENT: C3 Complement: 115 mg/dL (ref 82–167)

## 2016-03-12 LAB — MPO/PR-3 (ANCA) ANTIBODIES: Myeloperoxidase Abs: <9 U/mL (ref 0.0–9.0)

## 2016-03-12 LAB — C4 COMPLEMENT: Complement C4, Body Fluid: 30 mg/dL (ref 14–44)

## 2016-03-12 MED ORDER — HYDRALAZINE HCL 50 MG PO TABS
100.0000 mg | ORAL_TABLET | Freq: Three times a day (TID) | ORAL | Status: DC
  Administered 2016-03-12 – 2016-03-16 (×13): 100 mg via ORAL
  Filled 2016-03-12 (×13): qty 2

## 2016-03-12 MED ORDER — INSULIN DETEMIR 100 UNIT/ML ~~LOC~~ SOLN
10.0000 [IU] | Freq: Every day | SUBCUTANEOUS | Status: DC
  Administered 2016-03-12 – 2016-03-15 (×4): 10 [IU] via SUBCUTANEOUS
  Filled 2016-03-12 (×5): qty 0.1

## 2016-03-12 MED ORDER — PROMETHAZINE HCL 25 MG PO TABS
25.0000 mg | ORAL_TABLET | Freq: Four times a day (QID) | ORAL | Status: DC | PRN
  Administered 2016-03-12 – 2016-03-15 (×4): 25 mg via ORAL
  Filled 2016-03-12 (×4): qty 1

## 2016-03-12 NOTE — Progress Notes (Signed)
PROGRESS NOTE    Alexander Whitaker  ERD:[id number] DOB: [birth year] DOA: 03/09/2016 PCP: [name], MD    Brief Narrative:   Alexander Whitaker is a 65 y.o. male with medical history significant of HTN, DM, h/o CVA, restless legs, hypothyroidism, and CKD stage 3 presenting with acute renal failure. He was transferred from [facility].    Assessment & Plan:   Principal Problem:   Acute renal failure superimposed on chronic kidney disease ([place]) Active Problems:   Acute diastolic congestive heart failure (HCC)   Diabetes mellitus, type 2 (HCC)   Malignant hypertension   Hypothyroidism   Tobacco use disorder   Acute renal failure (ARF) (HCC)   Acute on Stage 3 CKD:  Suspect worsening of CKD/ DM and uncontrolled hypertension.  Nephrology consulted and recommendations given.  US renal does not show any obstruction.  UA no infection.  Complement levels wnl Will follow up serologies, SPEP pending.  Scheduled for renal biopsy on 12/18 by IR. [name] Discussed with the patient.  Avoid nephrotoxins.     Acute on chronic diastolic heart failure:  Resolved with IV lasix.   Monitor , strict intake and output.   Intake/Output Summary (Last 24 hours) at 03/12/16 1808 Last data filed at 03/12/16 1719  Gross per 24 hour  Intake              480 ml  Output             2750 ml  Net            -2270 ml      Hyperkalemia:  Kayexalate, given Repeat K in am is normal.    Hypomagnesemia: repleted.   Accelerated hypertension:  Improved. Added his home meds clonidine and hydralazine .    H/o CVA: No new deficits.    Hypothyroidism: Resume synthroid.  Diabetes mellitus: Well controlled. hgba1c is 6.4, and he had an episode of hypoglycemia this am.  Decreased the long acting from 15 to 10 units at bedtime.  CBG (last 3)   Recent Labs  03/12/16 0802 03/12/16 1148 03/12/16 1652  GLUCAP 65 200* 163*    Sob and cough: repeat CXR shows no pulmonary edema.    Anemia  ; normocytic, suspect anemia of chronic disease. Iron studies done.    DVT prophylaxis: heparin.  Code Status: (Full) Family Communication: wife at bedside.  Disposition Plan: pending further eval.    Consultants:   Nephrology    Procedures: US renal.    Antimicrobials: (none.    Subjective: Reports cough and sob.   Objective: Vitals:   03/12/16 0424 03/12/16 1100 03/12/16 1437 03/12/16 1555  BP: (!) 182/62 (!) 161/63 (!) 171/61 (!) 159/68  Pulse: 64 64 (!) 59 (!) 57  Resp: 16  19   Temp: 98.7 F (37.1 C)  97.7 F (36.5 C)   TempSrc: Oral  Oral   SpO2: 97%  99%   Weight:        Intake/Output Summary (Last 24 hours) at 03/12/16 1808 Last data filed at 03/12/16 1719  Gross per 24 hour  Intake              480 ml  Output             2750 ml  Net            -2270 ml   Filed Weights   03/12/16 0052  Weight: 76 kg (167 lb 8 oz)    Examination:  General exam: Appears calm and comfortable  Respiratory system: Clear to auscultation. Respiratory effort normal. Cardiovascular system: S1 & S2 heard, RRR. No JVD, murmurs, rubs, gallops or clicks. . Gastrointestinal system: Abdomen is nondistended, soft and nontender. No organomegaly or masses felt. Normal bowel sounds heard. Central nervous system: Alert and oriented. No focal neurological deficits. Extremities: 1+ leg edema.       Data Reviewed: I have personally reviewed following labs and imaging studies  CBC:  Recent Labs Lab 03/10/16 0426 03/11/16 0412  WBC 7.0 12.7*  NEUTROABS 6.3  --   HGB 8.0* 9.3*  HCT 23.3* 27.0*  MCV 88.6 90.6  PLT 202 983   Basic Metabolic Panel:  Recent Labs Lab 03/10/16 0426 03/11/16 0412 03/11/16 1154 03/12/16 0900  NA 134* 140 138 140  K 5.3* 4.1 4.2 4.0  CL 106 111 108 108  CO2 18* 19* 19* 20*  GLUCOSE 360* 62* 172* 125*  BUN 61* 60* 59* 61*  CREATININE 5.11* 5.22* 5.21* 5.34*  CALCIUM 7.4* 7.9* 7.6* 7.6*  MG 1.6*  --   --   --   PHOS 5.5*  --   --   --     GFR: CrCl cannot be calculated (Unknown ideal weight.). Liver Function Tests:  Recent Labs Lab 03/10/16 0426  AST 13*  ALT 13*  ALKPHOS 74  BILITOT 0.2*  PROT 5.4*  ALBUMIN 2.3*   No results for input(s): LIPASE, AMYLASE in the last 168 hours. No results for input(s): AMMONIA in the last 168 hours. Coagulation Profile: No results for input(s): INR, PROTIME in the last 168 hours. Cardiac Enzymes: No results for input(s): CKTOTAL, CKMB, CKMBINDEX, TROPONINI in the last 168 hours. BNP (last 3 results) No results for input(s): PROBNP in the last 8760 hours. HbA1C:  Recent Labs  03/10/16 0426 03/11/16 0412  HGBA1C 6.4* 6.7*   CBG:  Recent Labs Lab 03/11/16 1711 03/11/16 2121 03/12/16 0802 03/12/16 1148 03/12/16 1652  GLUCAP 94 207* 65 200* 163*   Lipid Profile: No results for input(s): CHOL, HDL, LDLCALC, TRIG, CHOLHDL, LDLDIRECT in the last 72 hours. Thyroid Function Tests:  Recent Labs  03/10/16 0426  TSH 1.282   Anemia Panel:  Recent Labs  03/11/16 0412  FERRITIN 222  TIBC 207*  IRON 46   Sepsis Labs: No results for input(s): PROCALCITON, LATICACIDVEN in the last 168 hours.  Recent Results (from the past 240 hour(s))  Culture, Urine     Status: Abnormal (Preliminary result)   Collection Time: 03/10/16  7:06 PM  Result Value Ref Range Status   Specimen Description URINE, CLEAN CATCH  Final   Special Requests NONE  Final   Culture 10,000 COLONIES/mL ENTEROCOCCUS FAECALIS (A)  Final   Report Status PENDING  Incomplete         Radiology Studies: Dg Chest 2 View  Result Date: 03/11/2016 CLINICAL DATA:  Shortness of Breath EXAM: CHEST  2 VIEW COMPARISON:  03/09/2016 FINDINGS: Cardiomediastinal silhouette is unremarkable. There is small left pleural effusion with left basilar atelectasis or infiltrate. No pulmonary edema. Osteopenia and mild degenerative changes lower thoracic spine. IMPRESSION: Small left pleural effusion with left basilar  atelectasis or infiltrate. No pulmonary edema. Electronically Signed   By: Lahoma Crocker M.D.   On: 03/11/2016 11:35        Scheduled Meds: . cloNIDine  0.1 mg Oral BID  . docusate sodium  100 mg Oral BID  . furosemide  80 mg Intravenous TID  . heparin subcutaneous  5,000 Units Subcutaneous Q8H  . [START ON 03/15/2016] heparin  5,000 Units Intravenous TID  . hydrALAZINE  100 mg Oral Q8H  . insulin aspart  0-9 Units Subcutaneous TID WC  . insulin detemir  10 Units Subcutaneous QHS  . levothyroxine  25 mcg Oral QAC breakfast  . metoprolol tartrate  25 mg Oral BID  . nicotine  14 mg Transdermal Daily  . rOPINIRole  1 mg Oral QHS  . tamsulosin  0.4 mg Oral QPC supper   Continuous Infusions:   LOS: 3 days    Time spent: 30 minutes.     Hosie Poisson, MD [facility] Pager [phone]  If 7PM-7AM, please contact night-coverage [email or web address] Password [email or web address] 03/12/2016, 6:08 PM

## 2016-03-12 NOTE — Progress Notes (Signed)
Admit: 03/09/2016 LOS: 3  50M progressive renal failure, nephrotic syndrome, hypervolemia, HTN urgency  Subjective:  No AM labs  UP/C = 16 Seen by IR, renal biopsy Monday C3 and C4 WNL Good UOP on lasix Edema improved No new complaints  12/15 0701 - 12/16 0700 In: 480 [P.O.:480] Out: 1650 [Urine:1650]  Filed Weights   03/12/16 0052  Weight: 76 kg (167 lb 8 oz)    Scheduled Meds: . cloNIDine  0.1 mg Oral BID  . docusate sodium  100 mg Oral BID  . furosemide  80 mg Intravenous TID  . heparin subcutaneous  5,000 Units Subcutaneous Q8H  . [START ON 03/15/2016] heparin  5,000 Units Intravenous TID  . hydrALAZINE  50 mg Oral Q8H  . insulin aspart  0-9 Units Subcutaneous TID WC  . insulin detemir  15 Units Subcutaneous QHS  . levothyroxine  25 mcg Oral QAC breakfast  . metoprolol tartrate  25 mg Oral BID  . nicotine  14 mg Transdermal Daily  . rOPINIRole  1 mg Oral QHS  . tamsulosin  0.4 mg Oral QPC supper   Continuous Infusions: PRN Meds:.acetaminophen **OR** acetaminophen, calcium carbonate (dosed in mg elemental calcium), camphor-menthol **AND** hydrOXYzine, docusate sodium, feeding supplement (NEPRO CARB STEADY), hydrALAZINE, ondansetron **OR** ondansetron (ZOFRAN) IV, promethazine, sorbitol, zolpidem  Current Labs: reviewed  Renal US 12.14: b/l nl size, echogenic UA with 0-5RBC/HPF, 4+ protein C3 and C4 12/14 WNL GBM Ab pending ACNA pending ANA pending SPEP and sFLC pending  Physical Exam:  Blood pressure (!) 182/62, pulse 64, temperature 98.7 F (37.1 C), temperature source Oral, resp. rate 16, weight 76 kg (167 lb 8 oz), SpO2 97 %. NAD RRR 2+ LEE S/nt/nd Diminished in the bases Nonfocal AAOx3  A 1. Progressive CKD with Nephrotic Proteinuria 2. DM2 since 1993 no evidence of DPR or DPN 3. HTN 4. Hx/o CVA on ASA $Remo'81mg'KKMDv$  daily 5. Blistering Rash 1 month ago  P 1. Labs ordered for today 2. Cont lasix 80 IV BID 3. Increase hydralazine to 100 TID 4. F/u  serologies, SPEP and sFLC 5. Tentative plan for Renal Bx 12/18; pt agreeable; radiology aware 6. Daily weights, Daily Renal Panel, Strict I/Os, Avoid nephrotoxins (NSAIDs, judicious IV Contrast) 7. Hold ASA for now   Pearson Grippe MD 03/12/2016, 8:48 AM   Recent Labs Lab 03/10/16 0426 03/11/16 0412 03/11/16 1154  NA 134* 140 138  K 5.3* 4.1 4.2  CL 106 111 108  CO2 18* 19* 19*  GLUCOSE 360* 62* 172*  BUN 61* 60* 59*  CREATININE 5.11* 5.22* 5.21*  CALCIUM 7.4* 7.9* 7.6*  PHOS 5.5*  --   --     Recent Labs Lab 03/10/16 0426 03/11/16 0412  WBC 7.0 12.7*  NEUTROABS 6.3  --   HGB 8.0* 9.3*  HCT 23.3* 27.0*  MCV 88.6 90.6  PLT 202 232

## 2016-03-13 LAB — URINE CULTURE

## 2016-03-13 LAB — BASIC METABOLIC PANEL
Anion gap: 13 (ref 5–15)
BUN: 61 mg/dL — AB (ref 6–20)
CO2: 18 mmol/L — AB (ref 22–32)
Calcium: 7.4 mg/dL — ABNORMAL LOW (ref 8.9–10.3)
Chloride: 107 mmol/L (ref 101–111)
Creatinine, Ser: 5.68 mg/dL — ABNORMAL HIGH (ref 0.61–1.24)
GFR calc Af Amer: 11 mL/min — ABNORMAL LOW (ref >60)
GFR, EST NON AFRICAN AMERICAN: 9 mL/min — AB (ref >60)
Glucose, Bld: 124 mg/dL — ABNORMAL HIGH (ref 65–99)
Potassium: 3.8 mmol/L (ref 3.5–5.1)
Sodium: 138 mmol/L (ref 135–145)

## 2016-03-13 LAB — GLUCOSE, CAPILLARY
GLUCOSE-CAPILLARY: 111 mg/dL — AB (ref 65–99)
GLUCOSE-CAPILLARY: 216 mg/dL — AB (ref 65–99)
Glucose-Capillary: 235 mg/dL — ABNORMAL HIGH (ref 65–99)
Glucose-Capillary: 204 mg/dL — ABNORMAL HIGH (ref 65–99)

## 2016-03-13 LAB — MAGNESIUM: Magnesium: 1.7 mg/dL (ref 1.7–2.4)

## 2016-03-13 LAB — TROPONIN I: Troponin I: <0.03 ng/mL (ref <0.03)

## 2016-03-13 MED ORDER — MAGNESIUM SULFATE 2 GM/50ML IV SOLN
2.0000 g | Freq: Once | INTRAVENOUS | Status: AC
  Administered 2016-03-13: 2 g via INTRAVENOUS
  Filled 2016-03-13: qty 50

## 2016-03-13 MED ORDER — DM-GUAIFENESIN ER 30-600 MG PO TB12
2.0000 | ORAL_TABLET | Freq: Two times a day (BID) | ORAL | Status: DC
  Administered 2016-03-13 – 2016-03-16 (×7): 2 via ORAL
  Filled 2016-03-13 (×7): qty 2

## 2016-03-13 MED ORDER — FUROSEMIDE 10 MG/ML IJ SOLN
80.0000 mg | Freq: Every day | INTRAMUSCULAR | Status: DC
  Administered 2016-03-13 – 2016-03-16 (×4): 80 mg via INTRAVENOUS
  Filled 2016-03-13 (×3): qty 8

## 2016-03-13 NOTE — Progress Notes (Signed)
Admit: 03/09/2016 LOS: 4  62M progressive renal failure, nephrotic syndrome, hypervolemia, HTN urgency  Subjective:  No AM labs back yet UP/C = 16 Good UOP on lasix Edema improved Says feels woozy when upright, BP much improved today; might be overshoot  12/16 0701 - 12/17 0700 In: 480 [P.O.:480] Out: 1280 [Urine:1280]  Filed Weights   03/12/16 0052 03/13/16 0442  Weight: 76 kg (167 lb 8 oz) 76.3 kg (168 lb 3.2 oz)    Scheduled Meds: . cloNIDine  0.1 mg Oral BID  . docusate sodium  100 mg Oral BID  . furosemide  80 mg Intravenous TID  . heparin subcutaneous  5,000 Units Subcutaneous Q8H  . [START ON 03/15/2016] heparin  5,000 Units Intravenous TID  . hydrALAZINE  100 mg Oral Q8H  . insulin aspart  0-9 Units Subcutaneous TID WC  . insulin detemir  10 Units Subcutaneous QHS  . levothyroxine  25 mcg Oral QAC breakfast  . metoprolol tartrate  25 mg Oral BID  . nicotine  14 mg Transdermal Daily  . rOPINIRole  1 mg Oral QHS  . tamsulosin  0.4 mg Oral QPC supper   Continuous Infusions: PRN Meds:.acetaminophen **OR** acetaminophen, calcium carbonate (dosed in mg elemental calcium), camphor-menthol **AND** hydrOXYzine, docusate sodium, feeding supplement (NEPRO CARB STEADY), hydrALAZINE, ondansetron **OR** ondansetron (ZOFRAN) IV, promethazine, sorbitol, zolpidem  Current Labs: reviewed  Renal US 12.14: b/l nl size, echogenic UA with 0-5RBC/HPF, 4+ protein C3 and C4 12/14 WNL GBM Ab pending ACNA MPO + PR3 negative ANA pending SPEP and sFLC pending  Physical Exam:  Blood pressure (!) 114/50, pulse 63, temperature 98.2 F (36.8 C), temperature source Oral, resp. rate 20, weight 76.3 kg (168 lb 3.2 oz), SpO2 98 %. NAD RRR 2+ LEE S/nt/nd Diminished in the bases Nonfocal AAOx3  A 1. Progressive CKD with Nephrotic Proteinuria 2. DM2 since 1993 no evidence of DPR or DPN 3. HTN; improved 4. Hx/o CVA on ASA $Remo'81mg'HGqVE$  daily 5. Blistering Rash 1 month ago  P 1. Change lasix  to 80 daily 2. Stop Hep [place] TID, hold ASA; place SCDs 3. F/u serologies, SPEP and sFLC 4. Plan for Renal Bx 12/18; pt agreeable; radiology aware and Bx form in Paper Chart 5. Daily weights, Daily Renal Panel, Strict I/Os, Avoid nephrotoxins (NSAIDs, judicious IV Contrast) 6. Stop clonidine, obtain orthostatics 7. FU AM Labs   Pearson Grippe MD 03/13/2016, 8:35 AM   Recent Labs Lab 03/10/16 0426 03/11/16 0412 03/11/16 1154 03/12/16 0900  NA 134* 140 138 140  K 5.3* 4.1 4.2 4.0  CL 106 111 108 108  CO2 18* 19* 19* 20*  GLUCOSE 360* 62* 172* 125*  BUN 61* 60* 59* 61*  CREATININE 5.11* 5.22* 5.21* 5.34*  CALCIUM 7.4* 7.9* 7.6* 7.6*  PHOS 5.5*  --   --   --     Recent Labs Lab 03/10/16 0426 03/11/16 0412  WBC 7.0 12.7*  NEUTROABS 6.3  --   HGB 8.0* 9.3*  HCT 23.3* 27.0*  MCV 88.6 90.6  PLT 202 232

## 2016-03-13 NOTE — Progress Notes (Signed)
PROGRESS NOTE    Alexander Whitaker  ALP:[id number] DOB: 1951-03-09 DOA: 03/09/2016 PCP: [name], MD   No chief complaint on file.   Brief Narrative:  Alexander Whitaker a 65 y.o.malewith medical history significant of HTN, DM, h/o CVA, restless legs, hypothyroidism, and CKD stage 3 presenting with acute renal failure. He was transferred from [place]   Acute/progressing renal failure on CKD stage III -Uncertain etiology -Creatinine today 5.68 -Nephrology consulted and appreciated -Renal ultrasound unremarkable for obstruction or hydronephrosis -UA unremarkable for infection -Complement levels within normal limits -Patient scheduled for renal biopsy on 03/14/2016 by interventional radiology -Patient placed on Lasix, changed to 80 mg daily -Serologies, SPEP pending -Continue to monitor daily weights, intake and output -Avoid nephrotoxins  Dyspnea/Cough and Chest pain -Patient having chest type pressure noted after he coughs. -Troponin obtained, less than 0.03 -EKG pending -will add on antitussives medication -Chest x-ray on 03/11/2016 noted for small left pleural effusion, no pulmonary edema  Acute diastolic heart failure -Likely diastolic in nature from volume overload associated with renal failure -Continue Lasix per nephrology -Monitor intake and output, daily weights  Diabetes mellitus, type II -Hemoglobin A1C 6.4 -Continue Levemir, insulin sliding scale, CBG monitoring  Accelerated Hypertension -BP improving -Continue hydralazine, Lasix, metoprolol  Hyperkalemia -Given kayexalate -Resolved  Hypomagnesemia -Replaced, currently 1.7 -Continue to monitor  H/o CVA -Aspirin currently held as patient will have a renal biopsy on 03/14/2016  Normocytic Anemia -Possibly due to chronic disease -continue to monitor CBC -Iron studies show adequate iron and iron stores  Hypothyroidism -Continue synthroid  Tobacco  dependence -Encourage cessation. -Continue 15 patch  DVT Prophylaxis  SCDs  Code Status: Full  Family Communication: Wife at bedside  Disposition Plan: Admitted. Likely home when medically stable  Consultants Nephrology Interventional radiology  Procedures  Renal ultrasound  Antibiotics   Anti-infectives    None      Subjective:   Alexander Whitaker seen and examined today.  Patient continues complain of some chest heaviness. Feels this pain when he coughs. Feels he is unable to get any sputum up. Denies any abdominal pain, nausea or vomiting, diarrhea or constipation, dizziness or headache. Wonders if he will be able to go home by Christmas.    Objective:   Vitals:   03/12/16 2214 03/13/16 0442 03/13/16 0734 03/13/16 1002  BP: (!) 160/58 (!) 147/61 (!) 114/50 (!) 153/60  Pulse: 69 66 63 69  Resp: $Remo'18 18 20   'tcxKb$ Temp: 98.3 F (36.8 C) 98.2 F (36.8 C)    TempSrc: Oral Oral    SpO2: 98% 97% 98% 100%  Weight:  76.3 kg (168 lb 3.2 oz)      Intake/Output Summary (Last 24 hours) at 03/13/16 1348 Last data filed at 03/13/16 1027  Gross per 24 hour  Intake                0 ml  Output             1255 ml  Net            -1255 ml   Filed Weights   03/12/16 0052 03/13/16 0442  Weight: 76 kg (167 lb 8 oz) 76.3 kg (168 lb 3.2 oz)    Exam  General: Well developed, well nourished, NAD, appears stated age  HEENT: NCAT, mucous membranes moist.   Cardiovascular: S1 S2 auscultated, 2/6 SEM, RRR  Respiratory: Diminished breath sounds, occasional cough. No wheezing or rhonchi.  Abdomen: Soft,  nontender, nondistended, + bowel sounds  Extremities: warm dry without cyanosis clubbing. LE edema  Neuro: AAOx3, Nonfocal  Psych: Normal affect and demeanor with intact judgement and insight   Data Reviewed: I have personally reviewed following labs and imaging studies  CBC:  Recent Labs Lab 03/10/16 0426 03/11/16 0412  WBC 7.0 12.7*  NEUTROABS 6.3  --   HGB 8.0* 9.3*    HCT 23.3* 27.0*  MCV 88.6 90.6  PLT 202 161   Basic Metabolic Panel:  Recent Labs Lab 03/10/16 0426 03/11/16 0412 03/11/16 1154 03/12/16 0900 03/13/16 0855  NA 134* 140 138 140 138  K 5.3* 4.1 4.2 4.0 3.8  CL 106 111 108 108 107  CO2 18* 19* 19* 20* 18*  GLUCOSE 360* 62* 172* 125* 124*  BUN 61* 60* 59* 61* 61*  CREATININE 5.11* 5.22* 5.21* 5.34* 5.68*  CALCIUM 7.4* 7.9* 7.6* 7.6* 7.4*  MG 1.6*  --   --   --  1.7  PHOS 5.5*  --   --   --   --    GFR: CrCl cannot be calculated (Unknown ideal weight.). Liver Function Tests:  Recent Labs Lab 03/10/16 0426  AST 13*  ALT 13*  ALKPHOS 74  BILITOT 0.2*  PROT 5.4*  ALBUMIN 2.3*   No results for input(s): LIPASE, AMYLASE in the last 168 hours. No results for input(s): AMMONIA in the last 168 hours. Coagulation Profile: No results for input(s): INR, PROTIME in the last 168 hours. Cardiac Enzymes:  Recent Labs Lab 03/13/16 0855  TROPONINI <0.03   BNP (last 3 results) No results for input(s): PROBNP in the last 8760 hours. HbA1C:  Recent Labs  03/11/16 0412  HGBA1C 6.7*   CBG:  Recent Labs Lab 03/12/16 1148 03/12/16 1652 03/12/16 2212 03/13/16 0802 03/13/16 1221  GLUCAP 200* 163* 197* 111* 235*   Lipid Profile: No results for input(s): CHOL, HDL, LDLCALC, TRIG, CHOLHDL, LDLDIRECT in the last 72 hours. Thyroid Function Tests: No results for input(s): TSH, T4TOTAL, FREET4, T3FREE, THYROIDAB in the last 72 hours. Anemia Panel:  Recent Labs  03/11/16 0412  FERRITIN 222  TIBC 207*  IRON 46   Urine analysis:    Component Value Date/Time   COLORURINE STRAW (A) 03/10/2016 1905   APPEARANCEUR CLEAR 03/10/2016 1905   LABSPEC 1.011 03/10/2016 1905   PHURINE 6.0 03/10/2016 1905   GLUCOSEU >=500 (A) 03/10/2016 1905   HGBUR SMALL (A) 03/10/2016 1905   BILIRUBINUR NEGATIVE 03/10/2016 1905   KETONESUR NEGATIVE 03/10/2016 1905   PROTEINUR >=300 (A) 03/10/2016 1905   NITRITE NEGATIVE 03/10/2016 1905    LEUKOCYTESUR NEGATIVE 03/10/2016 1905   Sepsis Labs: $RemoveBefo'@[email or web address]'grOiuYWGvQo$ (procalcitonin:4,lacticidven:4)  ) Recent Results (from the past 240 hour(s))  Culture, Urine     Status: Abnormal   Collection Time: 03/10/16  7:06 PM  Result Value Ref Range Status   Specimen Description URINE, CLEAN CATCH  Final   Special Requests NONE  Final   Culture 10,000 COLONIES/mL ENTEROCOCCUS FAECALIS (A)  Final   Report Status 03/13/2016 FINAL  Final   Organism ID, Bacteria ENTEROCOCCUS FAECALIS (A)  Final      Susceptibility   Enterococcus faecalis - MIC*    AMPICILLIN <=2 SENSITIVE Sensitive     LEVOFLOXACIN 1 SENSITIVE Sensitive     NITROFURANTOIN <=16 SENSITIVE Sensitive     VANCOMYCIN 1 SENSITIVE Sensitive     * 10,000 COLONIES/mL ENTEROCOCCUS FAECALIS      Radiology Studies: No results found.   Scheduled Meds: . docusate  sodium  100 mg Oral BID  . furosemide  80 mg Intravenous Daily  . hydrALAZINE  100 mg Oral Q8H  . insulin aspart  0-9 Units Subcutaneous TID WC  . insulin detemir  10 Units Subcutaneous QHS  . levothyroxine  25 mcg Oral QAC breakfast  . metoprolol tartrate  25 mg Oral BID  . nicotine  14 mg Transdermal Daily  . rOPINIRole  1 mg Oral QHS  . tamsulosin  0.4 mg Oral QPC supper   Continuous Infusions:   LOS: 4 days   Time Spent in minutes   30 minutes  Alexander Whitaker D.O. on 03/13/2016 at 1:48 PM  Between 7am to 7pm - Pager - [phone]  After 7pm go to [email or web address] - password [email or web address]  And look for the night coverage person covering for me after hours  [facility] Office  [phone]

## 2016-03-13 NOTE — Progress Notes (Signed)
Text paged Dr. Ree Kida with unconfirmed EKG report. Will continue to monitor.  Alphonzo Lemmings, RN

## 2016-03-13 NOTE — Progress Notes (Signed)
Pt. got up to bathroom this am and started having some chest heaviness, felt like someone was sitting on his chest, smothering him. [place] now. VS 114/50, 63, 20, 98% on 2L O2.  Text paged Dr. Ree Kida, with return call and orders for EKG and labs.  Will continue to monitor and carry out orders.  Alphonzo Lemmings, RN

## 2016-03-14 ENCOUNTER — Inpatient Hospital Stay (HOSPITAL_COMMUNITY): Payer: Commercial Managed Care - HMO

## 2016-03-14 LAB — GLUCOSE, CAPILLARY
Glucose-Capillary: 130 mg/dL — ABNORMAL HIGH (ref 65–99)
Glucose-Capillary: 131 mg/dL — ABNORMAL HIGH (ref 65–99)
Glucose-Capillary: 159 mg/dL — ABNORMAL HIGH (ref 65–99)
Glucose-Capillary: 136 mg/dL — ABNORMAL HIGH (ref 65–99)

## 2016-03-14 LAB — BASIC METABOLIC PANEL
Anion gap: 12 (ref 5–15)
BUN: 61 mg/dL — AB (ref 6–20)
CHLORIDE: 104 mmol/L (ref 101–111)
CO2: 20 mmol/L — ABNORMAL LOW (ref 22–32)
CREATININE: 5.79 mg/dL — AB (ref 0.61–1.24)
Calcium: 7.6 mg/dL — ABNORMAL LOW (ref 8.9–10.3)
GFR calc Af Amer: 11 mL/min — ABNORMAL LOW (ref >60)
GFR, EST NON AFRICAN AMERICAN: 9 mL/min — AB (ref >60)
GLUCOSE: 140 mg/dL — AB (ref 65–99)
POTASSIUM: 3.7 mmol/L (ref 3.5–5.1)
Sodium: 136 mmol/L (ref 135–145)

## 2016-03-14 LAB — ANTINUCLEAR ANTIBODIES, IFA: ANA Ab, IFA: NEGATIVE

## 2016-03-14 LAB — KAPPA/LAMBDA LIGHT CHAINS
KAPPA FREE LGHT CHN: 65.7 mg/L — AB (ref 3.3–19.4)
KAPPA, LAMDA LIGHT CHAIN RATIO: 1.52 (ref 0.26–1.65)
LAMDA FREE LIGHT CHAINS: 43.1 mg/L — AB (ref 5.7–26.3)

## 2016-03-14 LAB — PROTIME-INR
INR: 1.09
PROTHROMBIN TIME: 14.1 s (ref 11.4–15.2)

## 2016-03-14 LAB — CBC
HCT: 23.8 % — ABNORMAL LOW (ref 39.0–52.0)
Hemoglobin: 8.3 g/dL — ABNORMAL LOW (ref 13.0–17.0)
MCH: 31.2 pg (ref 26.0–34.0)
MCHC: 34.9 g/dL (ref 30.0–36.0)
MCV: 89.5 fL (ref 78.0–100.0)
PLATELETS: 151 10*3/uL (ref 150–400)
RBC: 2.66 MIL/uL — AB (ref 4.22–5.81)
RDW: 13.3 % (ref 11.5–15.5)
WBC: 9 10*3/uL (ref 4.0–10.5)

## 2016-03-14 LAB — MAGNESIUM: Magnesium: 2.3 mg/dL (ref 1.7–2.4)

## 2016-03-14 LAB — GLOMERULAR BASEMENT MEMBRANE ANTIBODIES: GBM Ab: 6 units (ref 0–20)

## 2016-03-14 MED ORDER — FENTANYL CITRATE (PF) 100 MCG/2ML IJ SOLN
INTRAMUSCULAR | Status: AC | PRN
  Administered 2016-03-14: 50 ug via INTRAVENOUS

## 2016-03-14 MED ORDER — CALCIUM CARBONATE ANTACID 500 MG PO CHEW: 400.0000 mg | CHEWABLE_TABLET | Freq: Three times a day (TID) | ORAL | Status: DC | PRN

## 2016-03-14 MED ORDER — MIDAZOLAM HCL 2 MG/2ML IJ SOLN
INTRAMUSCULAR | Status: AC
  Administered 2016-03-14: 12:00:00
  Filled 2016-03-14: qty 2

## 2016-03-14 MED ORDER — MIDAZOLAM HCL 2 MG/2ML IJ SOLN
INTRAMUSCULAR | Status: AC | PRN
  Administered 2016-03-14: 0.5 mg via INTRAVENOUS
  Administered 2016-03-14: 1 mg via INTRAVENOUS

## 2016-03-14 MED ORDER — CALCIUM CARBONATE ANTACID 500 MG PO CHEW
400.0000 mg | CHEWABLE_TABLET | Freq: Once | ORAL | Status: AC
  Administered 2016-03-14: 400 mg via ORAL
  Filled 2016-03-14: qty 2

## 2016-03-14 MED ORDER — AMOXICILLIN-POT CLAVULANATE 500-125 MG PO TABS
1.0000 | ORAL_TABLET | Freq: Every day | ORAL | Status: DC
  Administered 2016-03-14 – 2016-03-16 (×3): 500 mg via ORAL
  Filled 2016-03-14 (×3): qty 1

## 2016-03-14 MED ORDER — FENTANYL CITRATE (PF) 100 MCG/2ML IJ SOLN
INTRAMUSCULAR | Status: AC
  Administered 2016-03-14: 12:00:00
  Filled 2016-03-14: qty 2

## 2016-03-14 MED ORDER — LIDOCAINE HCL (PF) 1 % IJ SOLN
INTRAMUSCULAR | Status: AC
  Administered 2016-03-14: 12:00:00
  Filled 2016-03-14: qty 10

## 2016-03-14 NOTE — Procedures (Signed)
L renal random renal cortex biopsy 16 g times two No comp/EBL

## 2016-03-14 NOTE — Care Management Important Message (Signed)
Important Message  Patient Details  Name: Alexander Whitaker MRN: [id number] Date of Birth: [birth year]   Medicare Important Message Given:  Yes    Shakeia Krus Abena 03/14/2016, 10:59 AM

## 2016-03-14 NOTE — Care Management Note (Addendum)
Case Management Note  Patient Details  Name: Alexander Whitaker MRN: [id number] Date of Birth: Mar 26, 1951  Subjective/Objective:                 Patient admitted with A/C Renal Failure. Fluid overload, IV Lasix, renal biopsy. PT eval pending. From home with wife.    Action/Plan:  CM will continue to follow.  12/19 Spoke to patient and wife at the bedside. [name] services and DME. Have walker. Anticipate DC today later today after cleared by nephrology.   Expected Discharge Date:  03/14/16               Expected Discharge Plan:  [place]  In-House Referral:     Discharge planning Services  CM Consult  Post Acute Care Choice:    Choice offered to:     DME Arranged:    DME Agency:     HH Arranged:    HH Agency:     Status of Service:  In process, will continue to follow  If discussed at Long Length of Stay Meetings, dates discussed:    Additional Comments:  Carles Collet, RN 03/14/2016, 4:16 PM

## 2016-03-14 NOTE — Progress Notes (Signed)
Inpatient Diabetes Program Recommendations  AACE/ADA: New Consensus Statement on Inpatient Glycemic Control (2015)  Target Ranges:  Prepandial:   less than 140 mg/dL      Peak postprandial:   less than 180 mg/dL (1-2 hours)      Critically ill patients:  140 - 180 mg/dL   Lab Results  Component Value Date   GLUCAP 130 (H) 03/14/2016   HGBA1C 6.7 (H) 03/11/2016   Results for DONDRE, CATALFAMO (MRN [id number]) as of 03/14/2016 12:30  Ref. Range 03/13/2016 08:02 03/13/2016 12:21 03/13/2016 17:29 03/13/2016 21:31 03/14/2016 07:52  Glucose-Capillary Latest Ref Range: 65 - 99 mg/dL 111 (H) 235 (H) 204 (H) 216 (H) 130 (H)   Review of Glycemic Control  Diabetes history:     DM, BUN=61, Creatinine=5.79 Outpatient Diabetes medications:     Levemir 45 units in am and 35 units in evening Current orders for Inpatient glycemic control:     Novolog 0-9 units TIDAC, Levemir 10 units QHS  Inpatient Diabetes Program Recommendations:    Diet is modified to Carb modified to improve glucose control.  Thank you,  Windy Carina, RN, BSN Diabetes Coordinator Inpatient Diabetes Program [phone] (Team Pager)

## 2016-03-14 NOTE — Progress Notes (Signed)
PROGRESS NOTE    Alexander Whitaker  UQJ:[id number] DOB: October 27, 1950 DOA: 03/09/2016 PCP: [name], MD   No chief complaint on file.   Brief Narrative:  Alexander Whitaker a 65 y.o.malewith medical history significant of HTN, DM, h/o CVA, restless legs, hypothyroidism, and CKD stage 3 presenting with acute renal failure. He was transferred from [place]   Acute/progressing renal failure on CKD stage 3 -Uncertain etiology -Creatinine today 5.68 -Nephrology consulted and appreciated -Renal ultrasound unremarkable for obstruction or hydronephrosis -UA unremarkable for infection -Complement levels within normal limits -Patient scheduled for renal biopsy on 03/14/2016 by interventional radiology -Patient placed on Lasix, changed to 80 mg daily -Serologies, SPEP pending -Continue to monitor daily weights, intake and output -Avoid nephrotoxins  Dyspnea/Cough and Chest pain -Patient having chest type pressure noted after he coughs. -Troponin obtained, less than 0.03 -EKG pending -will add on antitussives medication -Chest x-ray on 03/11/2016 noted for small left pleural effusion, no pulmonary edema  Acute diastolic heart failure -Likely diastolic in nature from volume overload associated with renal failure -Continue Lasix per nephrology -Monitor intake and output, daily weights  Diabetes mellitus, type II -Hemoglobin A1C 6.4 -Continue Levemir, insulin sliding scale, CBG monitoring  CBG (last 3)   Recent Labs  03/13/16 1729 03/13/16 2131 03/14/16 0752  GLUCAP 204* 216* 130*   Accelerated Hypertension -BP improving -Continue hydralazine, Lasix, metoprolol  Acute Sinusitis - started on oral augmentin x 5 d  Hyperkalemia -Given kayexalate -Resolved  Hypomagnesemia -Replaced, currently 1.7 -Continue to monitor  H/o CVA -Aspirin currently held as patient will have a renal biopsy on 03/14/2016  Normocytic Anemia -Possibly  due to chronic disease -continue to monitor CBC -Iron studies show adequate iron and iron stores  Hypothyroidism -Continue synthroid  Tobacco dependence -Encourage cessation. -Continue 15 patch  DVT Prophylaxis  SCDs  Code Status: Full  Family Communication: Wife at bedside  Disposition Plan: Admitted. Likely home when medically stable  Consultants Nephrology Interventional radiology  Procedures  Renal ultrasound  Antibiotics   Anti-infectives    Start     Dose/Rate Route Frequency Ordered Stop   03/14/16 1130  amoxicillin-clavulanate (AUGMENTIN) 500-125 MG per tablet 500 mg     1 tablet Oral Daily 03/14/16 1050 03/19/16 0959      Subjective:   Fields Oros seen and examined today.  Pt complaining of sinus drainage and pressure.     Objective:   Vitals:   03/13/16 2127 03/14/16 0119 03/14/16 0121 03/14/16 0538  BP: (!) 170/65 (!) 152/63  (!) 158/59  Pulse: 81 70 70 70  Resp:  $Remo'18 18 18  'pIeit$ Temp: 99.6 F (37.6 C)   98.8 F (37.1 C)  TempSrc: Oral   Oral  SpO2: 91% 97% 98% 98%  Weight:      Height:   '5\' 5"'$  (1.651 m)     Intake/Output Summary (Last 24 hours) at 03/14/16 1126 Last data filed at 03/14/16 0539  Gross per 24 hour  Intake              477 ml  Output             1600 ml  Net            -1123 ml   Filed Weights   03/12/16 0052 03/13/16 0442  Weight: 76 kg (167 lb 8 oz) 76.3 kg (168 lb 3.2 oz)    Exam  General: Well developed, well nourished, NAD, appears stated age  HEENT: NCAT, mucous membranes moist.   Cardiovascular: S1 S2 auscultated, 2/6 SEM, RRR  Respiratory: Diminished breath sounds, occasional cough. No wheezing or rhonchi.  Abdomen: Soft, nontender, nondistended, + bowel sounds  Extremities: warm dry without cyanosis clubbing. LE edema  Neuro: AAOx3, Nonfocal  Psych: Normal affect and demeanor with intact judgement and insight   Data Reviewed: I have personally reviewed following labs and imaging  studies  CBC:  Recent Labs Lab 03/10/16 0426 03/11/16 0412 03/14/16 0339  WBC 7.0 12.7* 9.0  NEUTROABS 6.3  --   --   HGB 8.0* 9.3* 8.3*  HCT 23.3* 27.0* 23.8*  MCV 88.6 90.6 89.5  PLT 202 232 889   Basic Metabolic Panel:  Recent Labs Lab 03/10/16 0426 03/11/16 0412 03/11/16 1154 03/12/16 0900 03/13/16 0855 03/14/16 0339  NA 134* 140 138 140 138 136  K 5.3* 4.1 4.2 4.0 3.8 3.7  CL 106 111 108 108 107 104  CO2 18* 19* 19* 20* 18* 20*  GLUCOSE 360* 62* 172* 125* 124* 140*  BUN 61* 60* 59* 61* 61* 61*  CREATININE 5.11* 5.22* 5.21* 5.34* 5.68* 5.79*  CALCIUM 7.4* 7.9* 7.6* 7.6* 7.4* 7.6*  MG 1.6*  --   --   --  1.7 2.3  PHOS 5.5*  --   --   --   --   --    GFR: Estimated Creatinine Clearance: 11.8 mL/min (by C-G formula based on SCr of 5.79 mg/dL (H)). Liver Function Tests:  Recent Labs Lab 03/10/16 0426  AST 13*  ALT 13*  ALKPHOS 74  BILITOT 0.2*  PROT 5.4*  ALBUMIN 2.3*   No results for input(s): LIPASE, AMYLASE in the last 168 hours. No results for input(s): AMMONIA in the last 168 hours. Coagulation Profile:  Recent Labs Lab 03/14/16 0339  INR 1.09   Cardiac Enzymes:  Recent Labs Lab 03/13/16 0855  TROPONINI <0.03   BNP (last 3 results) No results for input(s): PROBNP in the last 8760 hours. HbA1C: No results for input(s): HGBA1C in the last 72 hours. CBG:  Recent Labs Lab 03/13/16 0802 03/13/16 1221 03/13/16 1729 03/13/16 2131 03/14/16 0752  GLUCAP 111* 235* 204* 216* 130*   Lipid Profile: No results for input(s): CHOL, HDL, LDLCALC, TRIG, CHOLHDL, LDLDIRECT in the last 72 hours. Thyroid Function Tests: No results for input(s): TSH, T4TOTAL, FREET4, T3FREE, THYROIDAB in the last 72 hours. Anemia Panel: No results for input(s): VITAMINB12, FOLATE, FERRITIN, TIBC, IRON, RETICCTPCT in the last 72 hours. Urine analysis:    Component Value Date/Time   COLORURINE STRAW (A) 03/10/2016 1905   APPEARANCEUR CLEAR 03/10/2016 1905    LABSPEC 1.011 03/10/2016 1905   PHURINE 6.0 03/10/2016 1905   GLUCOSEU >=500 (A) 03/10/2016 1905   HGBUR SMALL (A) 03/10/2016 1905   BILIRUBINUR NEGATIVE 03/10/2016 1905   KETONESUR NEGATIVE 03/10/2016 1905   PROTEINUR >=300 (A) 03/10/2016 1905   NITRITE NEGATIVE 03/10/2016 1905   LEUKOCYTESUR NEGATIVE 03/10/2016 1905    Recent Results (from the past 240 hour(s))  Culture, Urine     Status: Abnormal   Collection Time: 03/10/16  7:06 PM  Result Value Ref Range Status   Specimen Description URINE, CLEAN CATCH  Final   Special Requests NONE  Final   Culture 10,000 COLONIES/mL ENTEROCOCCUS FAECALIS (A)  Final   Report Status 03/13/2016 FINAL  Final   Organism ID, Bacteria ENTEROCOCCUS FAECALIS (A)  Final      Susceptibility   Enterococcus faecalis - MIC*    AMPICILLIN <=  2 SENSITIVE Sensitive     LEVOFLOXACIN 1 SENSITIVE Sensitive     NITROFURANTOIN <=16 SENSITIVE Sensitive     VANCOMYCIN 1 SENSITIVE Sensitive     * 10,000 COLONIES/mL ENTEROCOCCUS FAECALIS     Radiology Studies: No results found.   Scheduled Meds: . amoxicillin-clavulanate  1 tablet Oral Daily  . dextromethorphan-guaiFENesin  2 tablet Oral BID  . docusate sodium  100 mg Oral BID  . furosemide  80 mg Intravenous Daily  . hydrALAZINE  100 mg Oral Q8H  . insulin aspart  0-9 Units Subcutaneous TID WC  . insulin detemir  10 Units Subcutaneous QHS  . levothyroxine  25 mcg Oral QAC breakfast  . metoprolol tartrate  25 mg Oral BID  . nicotine  14 mg Transdermal Daily  . rOPINIRole  1 mg Oral QHS  . tamsulosin  0.4 mg Oral QPC supper   Continuous Infusions:   LOS: 5 days   Time Spent in minutes   28 minutes  Kaegan Hettich MD on 03/14/2016 at 11:26 AM  Between 7am to 7pm - Pager - [phone]  After 7pm go to [email or web address] - password [email or web address]  And look for the night coverage person covering for me after hours  [facility] Office  [phone]

## 2016-03-14 NOTE — Progress Notes (Addendum)
[facility] ROUNDING NOTE   Subjective:   Interval History:  Complains of sinus drainage and cough x 1 month    Objective:  Vital signs in last 24 hours:  Temp:  [98.6 F (37 C)-99.6 F (37.6 C)] 98.8 F (37.1 C) (12/18 0538) Pulse Rate:  [60-81] 70 (12/18 0538) Resp:  [17-19] 18 (12/18 0538) BP: (143-170)/(56-65) 158/59 (12/18 0538) SpO2:  [91 %-100 %] 98 % (12/18 0538)  Weight change:  Filed Weights   03/12/16 0052 03/13/16 0442  Weight: 76 kg (167 lb 8 oz) 76.3 kg (168 lb 3.2 oz)    Intake/Output: I/O last 3 completed shifts: In: 477 [P.O.:240; NG/GT:237] Out: 1925 [Urine:1925]   Intake/Output this shift:  No intake/output data recorded.  Current Labs: reviewed  Renal US 12.14: b/l nl size, echogenic UA with 0-5RBC/HPF, 4+ protein C3 and C4 12/14 WNL GBM Ab pending ACNA MPO + PR3 negative    Basic Metabolic Panel:  Recent Labs Lab 03/10/16 0426 03/11/16 0412 03/11/16 1154 03/12/16 0900 03/13/16 0855 03/14/16 0339  NA 134* 140 138 140 138 136  K 5.3* 4.1 4.2 4.0 3.8 3.7  CL 106 111 108 108 107 104  CO2 18* 19* 19* 20* 18* 20*  GLUCOSE 360* 62* 172* 125* 124* 140*  BUN 61* 60* 59* 61* 61* 61*  CREATININE 5.11* 5.22* 5.21* 5.34* 5.68* 5.79*  CALCIUM 7.4* 7.9* 7.6* 7.6* 7.4* 7.6*  MG 1.6*  --   --   --  1.7 2.3  PHOS 5.5*  --   --   --   --   --     Liver Function Tests:  Recent Labs Lab 03/10/16 0426  AST 13*  ALT 13*  ALKPHOS 74  BILITOT 0.2*  PROT 5.4*  ALBUMIN 2.3*   No results for input(s): LIPASE, AMYLASE in the last 168 hours. No results for input(s): AMMONIA in the last 168 hours.  CBC:  Recent Labs Lab 03/10/16 0426 03/11/16 0412 03/14/16 0339  WBC 7.0 12.7* 9.0  NEUTROABS 6.3  --   --   HGB 8.0* 9.3* 8.3*  HCT 23.3* 27.0* 23.8*  MCV 88.6 90.6 89.5  PLT 202 232 151    Cardiac Enzymes:  Recent Labs Lab 03/13/16 0855  TROPONINI <0.03    BNP: Invalid input(s): POCBNP  CBG:  Recent Labs Lab  03/13/16 0802 03/13/16 1221 03/13/16 1729 03/13/16 2131 03/14/16 0752  GLUCAP 111* 235* 204* 216* 130*    Microbiology: Results for orders placed or performed during the hospital encounter of 03/09/16  Culture, Urine     Status: Abnormal   Collection Time: 03/10/16  7:06 PM  Result Value Ref Range Status   Specimen Description URINE, CLEAN CATCH  Final   Special Requests NONE  Final   Culture 10,000 COLONIES/mL ENTEROCOCCUS FAECALIS (A)  Final   Report Status 03/13/2016 FINAL  Final   Organism ID, Bacteria ENTEROCOCCUS FAECALIS (A)  Final      Susceptibility   Enterococcus faecalis - MIC*    AMPICILLIN <=2 SENSITIVE Sensitive     LEVOFLOXACIN 1 SENSITIVE Sensitive     NITROFURANTOIN <=16 SENSITIVE Sensitive     VANCOMYCIN 1 SENSITIVE Sensitive     * 10,000 COLONIES/mL ENTEROCOCCUS FAECALIS    Coagulation Studies:  Recent Labs  03/14/16 0339  LABPROT 14.1  INR 1.09    Urinalysis: No results for input(s): COLORURINE, LABSPEC, PHURINE, GLUCOSEU, HGBUR, BILIRUBINUR, KETONESUR, PROTEINUR, UROBILINOGEN, NITRITE, LEUKOCYTESUR in the last 72 hours.  Invalid input(s): APPERANCEUR  Imaging: No results found.   Medications:    . dextromethorphan-guaiFENesin  2 tablet Oral BID  . docusate sodium  100 mg Oral BID  . furosemide  80 mg Intravenous Daily  . hydrALAZINE  100 mg Oral Q8H  . insulin aspart  0-9 Units Subcutaneous TID WC  . insulin detemir  10 Units Subcutaneous QHS  . levothyroxine  25 mcg Oral QAC breakfast  . metoprolol tartrate  25 mg Oral BID  . nicotine  14 mg Transdermal Daily  . rOPINIRole  1 mg Oral QHS  . tamsulosin  0.4 mg Oral QPC supper   acetaminophen **OR** acetaminophen, calcium carbonate (dosed in mg elemental calcium), camphor-menthol **AND** hydrOXYzine, docusate sodium, feeding supplement (NEPRO CARB STEADY), hydrALAZINE, ondansetron **OR** ondansetron (ZOFRAN) IV, promethazine, sorbitol, zolpidem  Assessment/ Plan:  1. Progressive  CKD with Nephrotic Proteinuria 2. DM2 since 1993 no evidence of DPR or DPN 3. HTN 4. Hx/o CVA on ASA $Remo'81mg'vxgkd$  daily 5. Blistering Rash 1 month ago  Plan  1.  Will change lasix to $Remove'80mg'wPVcccj$  daily oral  2. Increase hydralazine to 100 TID -- BP appears better  3.  Serologies negative  4. Tentative plan for Renal Bx 12/18; pt agreeable; radiology aware 5. Daily weights, Daily Renal Panel, Strict I/Os, Avoid nephrotoxins (NSAIDs, judicious IV Contrast) 6. Hold ASA for now          7,   Sinusitis will treat with augmentin daily x 5 d    LOS: 5 Normagene Harvie W $RemoveBefor'@TODAY'dajmlMYVbktS$ '@10'$ :42 AM

## 2016-03-15 LAB — RENAL FUNCTION PANEL
ALBUMIN: 2.4 g/dL — AB (ref 3.5–5.0)
Anion gap: 12 (ref 5–15)
BUN: 60 mg/dL — ABNORMAL HIGH (ref 6–20)
CALCIUM: 7.7 mg/dL — AB (ref 8.9–10.3)
CO2: 21 mmol/L — ABNORMAL LOW (ref 22–32)
CREATININE: 5.9 mg/dL — AB (ref 0.61–1.24)
Chloride: 105 mmol/L (ref 101–111)
GFR calc non Af Amer: 9 mL/min — ABNORMAL LOW (ref >60)
GFR, EST AFRICAN AMERICAN: 10 mL/min — AB (ref >60)
Glucose, Bld: 150 mg/dL — ABNORMAL HIGH (ref 65–99)
PHOSPHORUS: 6.5 mg/dL — AB (ref 2.5–4.6)
Potassium: 3.8 mmol/L (ref 3.5–5.1)
SODIUM: 138 mmol/L (ref 135–145)

## 2016-03-15 LAB — CBC
HCT: 25.7 % — ABNORMAL LOW (ref 39.0–52.0)
Hemoglobin: 8.8 g/dL — ABNORMAL LOW (ref 13.0–17.0)
MCH: 30.9 pg (ref 26.0–34.0)
MCHC: 34.2 g/dL (ref 30.0–36.0)
MCV: 90.2 fL (ref 78.0–100.0)
PLATELETS: 172 10*3/uL (ref 150–400)
RBC: 2.85 MIL/uL — ABNORMAL LOW (ref 4.22–5.81)
RDW: 13.1 % (ref 11.5–15.5)
WBC: 9.6 10*3/uL (ref 4.0–10.5)

## 2016-03-15 LAB — PROTEIN ELECTROPHORESIS, SERUM
A/G RATIO SPE: 0.9 (ref 0.7–1.7)
Albumin ELP: 2.5 g/dL — ABNORMAL LOW (ref 2.9–4.4)
Alpha-1-Globulin: 0.3 g/dL (ref 0.0–0.4)
Alpha-2-Globulin: 1.1 g/dL — ABNORMAL HIGH (ref 0.4–1.0)
Beta Globulin: 0.8 g/dL (ref 0.7–1.3)
GLOBULIN, TOTAL: 2.9 g/dL (ref 2.2–3.9)
Gamma Globulin: 0.8 g/dL (ref 0.4–1.8)
Total Protein ELP: 5.4 g/dL — ABNORMAL LOW (ref 6.0–8.5)

## 2016-03-15 LAB — GLUCOSE, CAPILLARY
GLUCOSE-CAPILLARY: 114 mg/dL — AB (ref 65–99)
GLUCOSE-CAPILLARY: 160 mg/dL — AB (ref 65–99)
Glucose-Capillary: 145 mg/dL — ABNORMAL HIGH (ref 65–99)
Glucose-Capillary: 206 mg/dL — ABNORMAL HIGH (ref 65–99)

## 2016-03-15 LAB — MAGNESIUM: Magnesium: 2.2 mg/dL (ref 1.7–2.4)

## 2016-03-15 NOTE — Progress Notes (Signed)
PROGRESS NOTE    Alexander Whitaker  YTK:[id number] DOB: March 25, 1951 DOA: 03/09/2016 PCP: [name], MD   Brief Narrative:  [name] a 65 y.o.malewith medical history significant of HTN, DM, h/o CVA, restless legs, hypothyroidism, and CKD stage 3 presenting with acute renal failure. He was transferred from [place]   Acute/progressing renal failure on CKD stage 3 -Uncertain etiology -Creatinine today 5.68 -Nephrology consulted and appreciated -Renal ultrasound unremarkable for obstruction or hydronephrosis -UA unremarkable for infection -Complement levels within normal limits -Patient scheduled for renal biopsy on 03/14/2016 by interventional radiology -Patient placed on Lasix, changed to 80 mg daily -Serologies, SPEP pending -Continue to monitor daily weights, intake and output -Avoid nephrotoxins  Dyspnea/Cough and Chest pain -Patient having chest type pressure noted after he coughs. -Troponin obtained, less than 0.03 -added antitussives medication as needed -Chest x-ray on 03/11/2016 noted for small left pleural effusion, no pulmonary edema  Acute diastolic heart failure -Likely diastolic in nature from volume overload associated with renal failure -Continue Lasix per nephrology -Monitor intake and output, daily weights  Diabetes mellitus, type 2 -Hemoglobin A1C 6.4 -Continue Levemir, insulin sliding scale, CBG monitoring  CBG (last 3)   Recent Labs  03/14/16 2209 03/15/16 0815 03/15/16 1232  GLUCAP 136* 114* 145*   Accelerated Hypertension -BP improving -Continue hydralazine, Lasix, metoprolol  Acute Sinusitis - started on oral augmentin x 5 d  Hyperkalemia -Given kayexalate -Resolved  Hypomagnesemia -Replaced, currently 1.7 -Continue to monitor  H/o CVA -Aspirin currently held as patient will have a renal biopsy on 03/14/2016  Normocytic Anemia -Possibly due to chronic disease -continue to monitor  CBC -Iron studies show adequate iron and iron stores  Hypothyroidism -Continue synthroid  Tobacco dependence -Encourage cessation. -Continue nicotine patch  Enterococcus UTI - sens to ampicillin which he is on right now  DVT Prophylaxis  SCDs  Code Status: Full  Family Communication: Wife at bedside  Disposition Plan: Home soon when ok with nephrology  Consultants Nephrology Interventional radiology  Procedures  Renal ultrasound  Antibiotics   Anti-infectives    Start     Dose/Rate Route Frequency Ordered Stop   03/14/16 1130  amoxicillin-clavulanate (AUGMENTIN) 500-125 MG per tablet 500 mg     1 tablet Oral Daily 03/14/16 1050 03/19/16 0959      Subjective:   [name] seen and examined today. Pt reports he got very nauseated when he smelled his breakfast and was not able to eat.  No emesis.      Objective:   Vitals:   03/14/16 1210 03/14/16 2209 03/15/16 0633 03/15/16 0907  BP: 130/61 (!) 176/59 (!) 164/67 (!) 164/62  Pulse: 69 78 68 70  Resp: $Remo'16 17 17   'giFRv$ Temp:  99.1 F (37.3 C) 98.5 F (36.9 C)   TempSrc:  Oral Oral   SpO2: 96% 97% 97%   Weight:   70.5 kg (155 lb 8 oz)   Height:        Intake/Output Summary (Last 24 hours) at 03/15/16 1339 Last data filed at 03/15/16 1218  Gross per 24 hour  Intake                0 ml  Output              400 ml  Net             -400 ml   Filed Weights   03/12/16 0052 03/13/16 0442 03/15/16 0633  Weight: 76  kg (167 lb 8 oz) 76.3 kg (168 lb 3.2 oz) 70.5 kg (155 lb 8 oz)    Exam  General: Well developed, well nourished, NAD, appears stated age  27: NCAT, mucous membranes moist.   Cardiovascular: S1 S2 auscultated, 2/6 SEM, RRR  Respiratory: Diminished breath sounds, occasional cough. No wheezing or rhonchi.  Abdomen: Soft, nontender, nondistended, + bowel sounds  Extremities: warm dry without cyanosis clubbing. LE edema  Neuro: AAOx3, Nonfocal  Psych: Normal affect and demeanor with intact  judgement and insight   Data Reviewed: I have personally reviewed following labs and imaging studies  CBC:  Recent Labs Lab 03/10/16 0426 03/11/16 0412 03/14/16 0339 03/15/16 0918  WBC 7.0 12.7* 9.0 9.6  NEUTROABS 6.3  --   --   --   HGB 8.0* 9.3* 8.3* 8.8*  HCT 23.3* 27.0* 23.8* 25.7*  MCV 88.6 90.6 89.5 90.2  PLT 202 232 151 528   Basic Metabolic Panel:  Recent Labs Lab 03/10/16 0426  03/11/16 1154 03/12/16 0900 03/13/16 0855 03/14/16 0339 03/15/16 0918  NA 134*  < > 138 140 138 136 138  K 5.3*  < > 4.2 4.0 3.8 3.7 3.8  CL 106  < > 108 108 107 104 105  CO2 18*  < > 19* 20* 18* 20* 21*  GLUCOSE 360*  < > 172* 125* 124* 140* 150*  BUN 61*  < > 59* 61* 61* 61* 60*  CREATININE 5.11*  < > 5.21* 5.34* 5.68* 5.79* 5.90*  CALCIUM 7.4*  < > 7.6* 7.6* 7.4* 7.6* 7.7*  MG 1.6*  --   --   --  1.7 2.3 2.2  PHOS 5.5*  --   --   --   --   --  6.5*  < > = values in this interval not displayed. GFR: Estimated Creatinine Clearance: 10.6 mL/min (by C-G formula based on SCr of 5.9 mg/dL (H)). Liver Function Tests:  Recent Labs Lab 03/10/16 0426 03/15/16 0918  AST 13*  --   ALT 13*  --   ALKPHOS 74  --   BILITOT 0.2*  --   PROT 5.4*  --   ALBUMIN 2.3* 2.4*   No results for input(s): LIPASE, AMYLASE in the last 168 hours. No results for input(s): AMMONIA in the last 168 hours. Coagulation Profile:  Recent Labs Lab 03/14/16 0339  INR 1.09   Cardiac Enzymes:  Recent Labs Lab 03/13/16 0855  TROPONINI <0.03   BNP (last 3 results) No results for input(s): PROBNP in the last 8760 hours. HbA1C: No results for input(s): HGBA1C in the last 72 hours. CBG:  Recent Labs Lab 03/14/16 1232 03/14/16 1747 03/14/16 2209 03/15/16 0815 03/15/16 1232  GLUCAP 131* 159* 136* 114* 145*   Lipid Profile: No results for input(s): CHOL, HDL, LDLCALC, TRIG, CHOLHDL, LDLDIRECT in the last 72 hours. Thyroid Function Tests: No results for input(s): TSH, T4TOTAL, FREET4, T3FREE,  THYROIDAB in the last 72 hours. Anemia Panel: No results for input(s): VITAMINB12, FOLATE, FERRITIN, TIBC, IRON, RETICCTPCT in the last 72 hours. Urine analysis:    Component Value Date/Time   COLORURINE STRAW (A) 03/10/2016 1905   APPEARANCEUR CLEAR 03/10/2016 1905   LABSPEC 1.011 03/10/2016 1905   PHURINE 6.0 03/10/2016 1905   GLUCOSEU >=500 (A) 03/10/2016 1905   HGBUR SMALL (A) 03/10/2016 1905   BILIRUBINUR NEGATIVE 03/10/2016 [place] 03/10/2016 1905   PROTEINUR >=300 (A) 03/10/2016 1905   NITRITE NEGATIVE 03/10/2016 1905   LEUKOCYTESUR NEGATIVE  03/10/2016 1905    Recent Results (from the past 240 hour(s))  Culture, Urine     Status: Abnormal   Collection Time: 03/10/16  7:06 PM  Result Value Ref Range Status   Specimen Description URINE, CLEAN CATCH  Final   Special Requests NONE  Final   Culture 10,000 COLONIES/mL ENTEROCOCCUS FAECALIS (A)  Final   Report Status 03/13/2016 FINAL  Final   Organism ID, Bacteria ENTEROCOCCUS FAECALIS (A)  Final      Susceptibility   Enterococcus faecalis - MIC*    AMPICILLIN <=2 SENSITIVE Sensitive     LEVOFLOXACIN 1 SENSITIVE Sensitive     NITROFURANTOIN <=16 SENSITIVE Sensitive     VANCOMYCIN 1 SENSITIVE Sensitive     * 10,000 COLONIES/mL ENTEROCOCCUS FAECALIS     Radiology Studies: US Biopsy  Result Date: 27-Mar-2016 INDICATION: Nephron ex syndrome EXAM: ULTRASOUND-GUIDED BIOPSY RANDOM RENAL CORTEX.  CORE. MEDICATIONS: None. ANESTHESIA/SEDATION: Fentanyl 50 mcg IV; Versed 1.5 mg IV Moderate Sedation Time:  10 The patient was continuously monitored during the procedure by the interventional radiology nurse under my direct supervision. FLUOROSCOPY TIME:  None COMPLICATIONS: None immediate. PROCEDURE: Informed written consent was obtained from the patient after a thorough discussion of the procedural risks, benefits and alternatives. All questions were addressed. Maximal Sterile Barrier Technique was utilized including  caps, mask, sterile gowns, sterile gloves, sterile drape, hand hygiene and skin antiseptic. A timeout was performed prior to the initiation of the procedure. The back was prepped with ChloraPrep in a sterile fashion, and a sterile drape was applied covering the operative field. A sterile gown and sterile gloves were used for the procedure. Under sonographic guidance, 2 16 gauge core biopsies of the lower pole left renal cortex were obtained. Final imaging was performed. Patient tolerated the procedure well without complication. Vital sign monitoring by nursing staff during the procedure will continue as patient is in the special procedures unit for post procedure observation. FINDINGS: The images document guide needle placement within the left lower pole renal cortex. Post biopsy images demonstrate no hemorrhage. IMPRESSION: Successful ultrasound-guided core biopsy of the left lower pole renal cortex. Electronically Signed   By: Marybelle Killings M.D.   On: 27-Mar-2016 13:54   Scheduled Meds: . amoxicillin-clavulanate  1 tablet Oral Daily  . dextromethorphan-guaiFENesin  2 tablet Oral BID  . docusate sodium  100 mg Oral BID  . furosemide  80 mg Intravenous Daily  . hydrALAZINE  100 mg Oral Q8H  . insulin aspart  0-9 Units Subcutaneous TID WC  . insulin detemir  10 Units Subcutaneous QHS  . levothyroxine  25 mcg Oral QAC breakfast  . metoprolol tartrate  25 mg Oral BID  . nicotine  14 mg Transdermal Daily  . rOPINIRole  1 mg Oral QHS  . tamsulosin  0.4 mg Oral QPC supper   Continuous Infusions:   LOS: 6 days   Time Spent in minutes   22 minutes  Clanford Johnson MD on 03/15/2016 at 1:39 PM  Between 7am to 7pm - Pager - [phone]  After 7pm go to [email or web address] - password [email or web address]  And look for the night coverage person covering for me after hours  [facility] Office  [phone]

## 2016-03-15 NOTE — Evaluation (Signed)
Physical Therapy Evaluation Patient Details Name: Alexander Whitaker MRN: [id number] DOB: [birth year] Today's Date: 03/15/2016   History of Present Illness  Alexander Whitaker is a 65 y.o. male with medical history significant of HTN, DM, h/o CVA, restless legs, hypothyroidism, and CKD stage 3 presenting with acute renal failure.  Pt with LE edema  Clinical Impression  Pt with generalized weakness. Pt with no more LE edema but is unable to eat and has an onset of nausea with mobility greatly limiting ambulation ability. Acute PT to follow pt to progress mobility and independence since pt home alone during the day.    Follow Up Recommendations No PT follow up;Supervision/Assistance - 24 hour (refused HHPT and AD)    Equipment Recommendations  None recommended by PT (pt refused use of AD)    Recommendations for Other Services       Precautions / Restrictions Precautions Precautions: Fall Precaution Comments: nausea with mobility Restrictions Weight Bearing Restrictions: No      Mobility  Bed Mobility Overal bed mobility: Modified Independent             General bed mobility comments: HOB elevated and definite use of bed rail, labored effort, increased time  Transfers Overall transfer level: Needs assistance Equipment used: None Transfers: Sit to/from Stand Sit to Stand: Min guard         General transfer comment: increased time. refused AD  Ambulation/Gait Ambulation/Gait assistance: Min guard Ambulation Distance (Feet): 50 Feet Assistive device: None Gait Pattern/deviations: Step-through pattern;Decreased stride length;Staggering left;Staggering right Gait velocity: slow Gait velocity interpretation: Below normal speed for age/gender General Gait Details: pt mildly unsteady, reports "i feel so weak from not eating" pt limited by onset of nausea during ambulation, pt turned around and went back to bed  Stairs            Wheelchair Mobility    Modified Rankin  (Stroke Patients Only)       Balance Overall balance assessment: No apparent balance deficits (not formally assessed)                                           Pertinent Vitals/Pain Pain Assessment: No/denies pain    Home Living Family/patient expects to be discharged to:: Private residence Living Arrangements: Spouse/significant other Available Help at Discharge: Family;Available PRN/intermittently (wife works during day, has family around him) Type of Home: House Home Access: Stairs to enter Entrance Stairs-Rails: Can reach both Entrance Stairs-Number of Steps: 4-5 Home Layout: One [place]: Grab bars - tub/shower;Shower seat - built in      Prior Function Level of Independence: Independent         Comments: drives, does the cooking     Hand Dominance   Dominant Hand: Right    Extremity/Trunk Assessment   Upper Extremity Assessment Upper Extremity Assessment: Overall WFL for tasks assessed    Lower Extremity Assessment Lower Extremity Assessment: Overall WFL for tasks assessed    Cervical / Trunk Assessment Cervical / Trunk Assessment: Normal  Communication   Communication: No difficulties  Cognition Arousal/Alertness: Awake/alert Behavior During Therapy: WFL for tasks assessed/performed Overall Cognitive Status: Within Functional Limits for tasks assessed                      General Comments General comments (skin integrity, edema, etc.): hasn't been able to eat much  Exercises     Assessment/Plan    PT Assessment Patient needs continued PT services  PT Problem List Decreased strength;Decreased activity tolerance;Decreased balance;Decreased mobility          PT Treatment Interventions DME instruction;Gait training;Stair training;Functional mobility training;Therapeutic activities;Therapeutic exercise;Balance training    PT Goals (Current goals can be found in the Care Plan section)  Acute Rehab PT  Goals Patient Stated Goal: to be able to eat PT Goal Formulation: With patient Time For Goal Achievement: 03/22/16 Potential to Achieve Goals: Good    Frequency Min 3X/week   Barriers to discharge        Co-evaluation               End of Session Equipment Utilized During Treatment: Gait belt Activity Tolerance:  (limited by nausea) Patient left: in bed;with call bell/phone within reach;with family/visitor present Nurse Communication: Mobility status         Time: 1340-1356 PT Time Calculation (min) (ACUTE ONLY): 16 min   Charges:   PT Evaluation $PT Eval Low Complexity: 1 Procedure     PT G Codes:        Pamella Samons M Erandy Mceachern 03/15/2016, 2:17 PM   Kittie Plater, PT, DPT Pager #: [phone] Office #: [phone]

## 2016-03-15 NOTE — Progress Notes (Signed)
PT Cancellation Note  Patient Details Name: Alexander Whitaker MRN: [id number] DOB: 07-24-1950   Cancelled Treatment:    Reason Eval/Treat Not Completed: Other (comment). Pt with severe nausea and just received phenergen. Patient politely declined at this time and reported we can try later today. PT to return as able.   Alaysha Jefcoat M Areonna Bran 03/15/2016, 10:49 AM   Kittie Plater, PT, DPT Pager #: [phone] Office #: [phone]

## 2016-03-16 LAB — RENAL FUNCTION PANEL
ALBUMIN: 2.2 g/dL — AB (ref 3.5–5.0)
Anion gap: 10 (ref 5–15)
BUN: 58 mg/dL — AB (ref 6–20)
CALCIUM: 7.6 mg/dL — AB (ref 8.9–10.3)
CO2: 21 mmol/L — AB (ref 22–32)
Chloride: 107 mmol/L (ref 101–111)
Creatinine, Ser: 5.93 mg/dL — ABNORMAL HIGH (ref 0.61–1.24)
GFR calc Af Amer: 10 mL/min — ABNORMAL LOW (ref >60)
GFR calc non Af Amer: 9 mL/min — ABNORMAL LOW (ref >60)
GLUCOSE: 184 mg/dL — AB (ref 65–99)
PHOSPHORUS: 5.7 mg/dL — AB (ref 2.5–4.6)
Potassium: 3.7 mmol/L (ref 3.5–5.1)
SODIUM: 138 mmol/L (ref 135–145)

## 2016-03-16 LAB — GLUCOSE, CAPILLARY
GLUCOSE-CAPILLARY: 116 mg/dL — AB (ref 65–99)
GLUCOSE-CAPILLARY: 211 mg/dL — AB (ref 65–99)

## 2016-03-16 LAB — CBC
HCT: 25.2 % — ABNORMAL LOW (ref 39.0–52.0)
Hemoglobin: 8.7 g/dL — ABNORMAL LOW (ref 13.0–17.0)
MCH: 30.7 pg (ref 26.0–34.0)
MCHC: 34.5 g/dL (ref 30.0–36.0)
MCV: 89 fL (ref 78.0–100.0)
Platelets: 191 10*3/uL (ref 150–400)
RBC: 2.83 MIL/uL — ABNORMAL LOW (ref 4.22–5.81)
RDW: 13 % (ref 11.5–15.5)
WBC: 9.6 10*3/uL (ref 4.0–10.5)

## 2016-03-16 MED ORDER — AMLODIPINE BESYLATE 5 MG PO TABS: 5.0000 mg | ORAL_TABLET | Freq: Every day | ORAL | 0 refills | Status: DC

## 2016-03-16 MED ORDER — NEPRO/CARBSTEADY PO LIQD: 237.0000 mL | Freq: Three times a day (TID) | ORAL | 0 refills | Status: DC | PRN

## 2016-03-16 MED ORDER — AMOXICILLIN-POT CLAVULANATE 500-125 MG PO TABS: 1.0000 | ORAL_TABLET | Freq: Every day | ORAL | 0 refills | Status: AC

## 2016-03-16 MED ORDER — TAMSULOSIN HCL 0.4 MG PO CAPS: 0.4000 mg | ORAL_CAPSULE | Freq: Every day | ORAL | 0 refills | Status: DC

## 2016-03-16 MED ORDER — PROMETHAZINE HCL 12.5 MG PO TABS: 12.5000 mg | ORAL_TABLET | Freq: Three times a day (TID) | ORAL | 0 refills | Status: DC | PRN

## 2016-03-16 MED ORDER — AMOXICILLIN-POT CLAVULANATE 500-125 MG PO TABS: 1.0000 | ORAL_TABLET | Freq: Every day | ORAL | 0 refills | Status: DC

## 2016-03-16 MED ORDER — INSULIN DETEMIR 100 UNIT/ML FLEXPEN: 10.0000 [IU] | PEN_INJECTOR | Freq: Every day | SUBCUTANEOUS | 11 refills | Status: DC

## 2016-03-16 MED ORDER — FUROSEMIDE 80 MG PO TABS: 80.0000 mg | ORAL_TABLET | Freq: Every day | ORAL | 0 refills | Status: DC

## 2016-03-16 NOTE — Discharge Instructions (Signed)
Acute Kidney Injury, Adult Acute kidney injury (AKI) occurs when there is sudden (acute) damage to the kidneys.A small amount of kidney damage may not cause problems, but a large amount of damage may make it difficult or impossible for the kidneys to work the way they should. AKI may develop into long-lasting (chronic) kidney disease. Early detection and treatment of AKI may prevent kidney damage from becoming permanent or getting worse. What are the causes? Common causes of this condition include:  A problem with blood flow to the kidneys. This may be caused by:  Blood loss.  Heart and blood vessel (cardiovascular) disease.  Severe burns.  Liver disease.  Direct damage to the kidneys. This may be caused by:  Some medicines.  A kidney infection.  Poisoning.  Being around or in contact with poisonous (toxic) substances.  A surgical wound.  A hard, direct force to the kidney area.  A sudden blockage of urine flow. This may be caused by:  Cancer.  Kidney stones.  Enlarged prostate in males. What are the signs or symptoms? Symptoms develop slowly and may not be obvious until the kidney damage becomes severe. It is possible to have AKI for years without showing any symptoms. Symptoms of this condition can include:  Swelling (edema) of the face, legs, ankles, or feet.  Numbness, tingling, or loss of feeling (sensation) in the hands or feet.  Tiredness (lethargy).  Nausea or vomiting.  Confusion or trouble concentrating.  Problems with urination, such as:  Painful or burning feeling during urination.  Decreased urine production.  Frequent urination, especially at night.  Bloody urine.  Muscle twitches and cramps, especially in the legs.  Shortness of breath.  Weakness.  Constant itchiness.  Loss of appetite.  Metallic taste in the mouth.  Trouble sleeping.  Pale lining of the eyelids and surface of the eye (conjunctiva). How is this diagnosed? This  condition may be diagnosed with various tests. Tests may include:  Blood tests.  Urine tests.  Imaging tests.  A test in which a sample of tissue is removed from the kidneys to be looked at under a microscope (kidney biopsy). How is this treated? Treatment of AKIvaries depending on the cause and severity of the kidney damage. In mild cases, treatment may not be needed. The kidneys may heal on their own. If AKI is more severe, your health care provider will treat the cause of the kidney damage, help the kidneys heal, and prevent problems from occurring. Severe cases may require a procedure to remove toxic wastes from the body (dialysis) or surgery to repair kidney damage. Surgery may involve:  Repair of a torn kidney.  Removal of a urine flow obstruction. Follow these instructions at home:  Follow your prescribed diet.  Take over-the-counter and prescription medicines only as told by your health care provider.  Do not take any new medicines unless approved by your health care provider. Many medicines can worsen your kidney damage.  Do not take any vitamin and mineral supplements unless approved by your health care provider. Many nutritional supplements can worsen your kidney damage.  The dose of some medicines that you take may need to be adjusted.  Do not use any tobacco products, such as cigarettes, chewing tobacco, and e-cigarettes. If you need help quitting, ask your health care provider.  Keep all follow-up visits as told by your health care provider. This is important.  Keep track of your blood pressure. Report changes in your blood pressure as told by your  health care provider.  Achieve and maintain a healthy weight. If you need help with this, ask your health care provider.  Start or continue an exercise plan. Try to exercise at least 30 minutes a day, 5 days a week.  Stay current with immunizations as told by your health care provider. Where to find more  information:  American Association of Kidney Patients: BombTimer.gl  National Kidney Foundation: www.kidney.[place]: https://mathis.com/  [facility]: www.lifeoptions.org and www.kidneyschool.org Contact a health care provider if:  Your symptoms get worse.  You develop new symptoms. Get help right away if:  You develop symptoms of end-stage kidney disease, which include:  Headaches.  Abnormally dark or light skin.  Numbness in the hands or feet.  Easy bruising.  Frequent hiccups.  Chest pain.  Shortness of breath.  End of menstruation in women.  You have a fever.  You have decreased urine production.  You have pain or bleeding when you urinate. This information is not intended to replace advice given to you by your health care provider. Make sure you discuss any questions you have with your health care provider. Document Released: 09/27/2010 Document Revised: 10/22/2015 Document Reviewed: 11/11/2011 Elsevier Interactive Patient Education  2017 [employer].

## 2016-03-16 NOTE — Discharge Summary (Signed)
Physician Discharge Summary  Alexander Whitaker QMV:[id number] DOB: [birth year] DOA: 03/09/2016  PCP: [name], MD  Admit date: 03/09/2016 Discharge date: 03/16/2016  Admitted From: Home  Disposition:  Home   Recommendations for Outpatient Follow-up:  1. Follow up with PCP in 1 weeks 2. Follow up with [facility] in 2 weeks for biopsy results and ongoing management of CKD 3. Please obtain BMP in 1 week  4. Please follow up on renal biopsy results.   Discharge Condition: STABLE CODE STATUS: FULL  Diet recommendation: Renal/Carb Modified   Brief/Interim Summary: HPI: Alexander Whitaker is a 65 y.o. male with medical history significant of HTN, DM, h/o CVA, restless legs, hypothyroidism, and CKD stage 3 presenting with acute renal failure.  Started with LE edema about 1 1/2 months ago.  Broke out in a rash.  Went to see physician, was given Kenalog shot.  Did not help fluid, but did help rash some (still present "a tad").  SOB x 1 month, progressive.  Worse with ambulation, sometimes while lying flat feels smothered.  Has used an inhaler with some improvement.  This AM, noticed severe SOB.  Took an [employer] and got better so did not call 911.  Coughs, "feels like my throat's gonna come up", thinks it might be related to sinuses.  Cough only sporadic.   At [facility] today, BP was 221/94, unable to get it down for a while.   Has seen nephrology - had circumcision about 2 years ago, lifetime 30-35 kidney stones, lithotripsy once.  Urologist did not see any stones.    Brief Narrative:  [name] a 65 y.o.malewith medical history significant of HTN, DM, h/o CVA, restless legs, hypothyroidism, and CKD stage 3 presenting with acute renal failure. He was transferred from [place]   Acute/progressing renal failure on CKD stage 3 -Uncertain etiology -Creatinine 5.9 last 2 days -Nephrology consulted and appreciated -Renal  ultrasound unremarkable for obstruction or hydronephrosis -UTI being treated - see below -Complement levels within normal limits -Patient scheduled for renal biopsy on 03/14/2016 by interventional radiology -Patient placed on Lasix, changed to 80 mg daily -Serologies, SPEP pending: follow up outpatient -Monitored daily weights, intake and output -Avoid nephrotoxins, renal diet, fluid restriction -I spoke with Dr. Justin Mend, [place] to discharge home with follow up with [facility] in 2 weeks for biopsy results and ongoing management  Dyspnea/Cough and Chest pain -Patient having chest type pressure noted after he coughs. -Troponin obtained, less than 0.03 -added antitussives medication as needed -Chest x-ray on 03/11/2016 noted for small left pleural effusion, no pulmonary edema  Acute diastolic heart failure -Likely diastolic in nature from volume overload associated with renal failure -Continue Lasix per nephrology 80 mg oral daily -Monitored intake and output, daily weights  Diabetes mellitus, type 2 -Hemoglobin A1C 6.4 -Continue Levemir 10 units daily and close monitoring at home. Hypoglycemia precautions  CBG (last 3)   Recent Labs (last 2 labs)    Recent Labs  03/14/16 2209 03/15/16 0815 03/15/16 1232  GLUCAP 136* 114* 145*     Accelerated Hypertension -BP improving -Continue hydralazine, Lasix, metoprolol, resume low dose amlodipine at discharge 5 mg daily  Acute Sinusitis - started on oral augmentin x 5 d to complete in 3 more days  Hyperkalemia -Given kayexalate -Resolved  Hypomagnesemia -Repleted in hospital.  H/o CVA -Aspirin currently held as patient will have a renal biopsy on 03/14/2016, can resume at discharge  Normocytic Anemia -  Possibly due to chronic disease -continue to monitor CBC -Iron studies show adequate iron and iron stores  Hypothyroidism -Continue synthroid  Tobacco dependence -Encourage cessation. -Continue nicotine  patch  Enterococcus UTI - sens to ampicillin which he is on right now and will continue a couple more days after discharge  DVT Prophylaxis  SCDs  Code Status: Full  Family Communication: Wife at bedside  Disposition Plan: Home soon when ok with nephrology  Consultants Nephrology Interventional radiology  Procedures  Renal ultrasound  Discharge Diagnoses:  Principal Problem:   Acute renal failure superimposed on chronic kidney disease ([place]) Active Problems:   Acute diastolic congestive heart failure ([place])   Diabetes mellitus, type 2 ([place])   Malignant hypertension   Hypothyroidism   Tobacco use disorder   Acute renal failure (ARF) ([place])  Discharge Instructions  Discharge Instructions    Increase activity slowly    Complete by:  As directed      Allergies as of 03/16/2016   No Known Allergies     Medication List    STOP taking these medications   cloNIDine 0.1 MG tablet Commonly known as:  CATAPRES   hydrOXYzine 25 MG tablet Commonly known as:  ATARAX/VISTARIL   lisinopril 40 MG tablet Commonly known as:  PRINIVIL,ZESTRIL   ofloxacin 0.3 % ophthalmic solution Commonly known as:  OCUFLOX   pramipexole 0.5 MG tablet Commonly known as:  MIRAPEX     TAKE these medications   albuterol 108 (90 Base) MCG/ACT inhaler Commonly known as:  PROVENTIL HFA;VENTOLIN HFA Inhale 2 puffs into the lungs every 6 (six) hours as needed for wheezing or shortness of breath.   amLODipine 5 MG tablet Commonly known as:  NORVASC Take 1 tablet (5 mg total) by mouth daily. What changed:  medication strength  how much to take   amoxicillin-clavulanate 500-125 MG tablet Commonly known as:  AUGMENTIN Take 1 tablet (500 mg total) by mouth daily. Start taking on:  03/17/2016   feeding supplement (NEPRO CARB STEADY) Liqd Take 237 mLs by mouth 3 (three) times daily as needed (Supplement).   furosemide 80 MG tablet Commonly known as:  LASIX Take 1 tablet (80 mg  total) by mouth daily.   hydrALAZINE 10 MG tablet Commonly known as:  APRESOLINE Take 10 mg by mouth 2 (two) times daily.   Insulin Detemir 100 UNIT/ML Pen Commonly known as:  LEVEMIR Inject 10 Units into the skin daily at 10 pm. What changed:  how much to take  when to take this  additional instructions   levothyroxine 25 MCG tablet Commonly known as:  SYNTHROID, LEVOTHROID Take 25 mcg by mouth daily before breakfast.   metoprolol tartrate 25 MG tablet Commonly known as:  LOPRESSOR Take 25 mg by mouth 2 (two) times daily.   promethazine 12.5 MG tablet Commonly known as:  PHENERGAN Take 1 tablet (12.5 mg total) by mouth every 8 (eight) hours as needed for nausea, vomiting or refractory nausea / vomiting.   tamsulosin 0.4 MG Caps capsule Commonly known as:  FLOMAX Take 1 capsule (0.4 mg total) by mouth daily after supper.      Follow-up Information    SCHULTZ,DOUGLAS E, MD. Schedule an appointment as soon as possible for a visit in 1 week(s).   Specialty:  Internal Medicine Contact information: [address] [place] [address] [phone]        Sherril Croon, MD. Schedule an appointment as soon as possible for a visit in 2 week(s).  Specialty:  Nephrology Why:  Hospital Follow Up: Biopsy Results Contact information: [place] [address] [phone]          No Known Allergies  Procedures/Studies: Dg Chest 2 View  Result Date: 03/11/2016 CLINICAL DATA:  Shortness of Breath EXAM: CHEST  2 VIEW COMPARISON:  03/09/2016 FINDINGS: Cardiomediastinal silhouette is unremarkable. There is small left pleural effusion with left basilar atelectasis or infiltrate. No pulmonary edema. Osteopenia and mild degenerative changes lower thoracic spine. IMPRESSION: Small left pleural effusion with left basilar atelectasis or infiltrate. No pulmonary edema. Electronically Signed   By: Lahoma Crocker M.D.   On: 03/11/2016 11:35   US  Renal  Result Date: 03/10/2016 CLINICAL DATA:  65 year old male with acute on chronic kidney disease. EXAM: RENAL / URINARY TRACT ULTRASOUND COMPLETE COMPARISON:  Renal ultrasound dated 10/09/2015 and CT of the abdomen pelvis dated 02/02/2015 FINDINGS: Right Kidney: Length: 11 cm. Mild increased echogenicity. No hydronephrosis or echogenic stone. Left Kidney: Length: 12 cm. Mild increased echogenicity. No hydronephrosis or echogenic stone. Bladder: Appears normal for degree of bladder distention. IMPRESSION: Upper limits of normal echogenicity may be related to chronic kidney disease. No hydronephrosis or echogenic stone. Electronically Signed   By: Anner Crete M.D.   On: 03/10/2016 04:12   US Biopsy  Result Date: 03/14/2016 INDICATION: Nephron ex syndrome EXAM: ULTRASOUND-GUIDED BIOPSY RANDOM RENAL CORTEX.  CORE. MEDICATIONS: None. ANESTHESIA/SEDATION: Fentanyl 50 mcg IV; Versed 1.5 mg IV Moderate Sedation Time:  10 The patient was continuously monitored during the procedure by the interventional radiology nurse under my direct supervision. FLUOROSCOPY TIME:  None COMPLICATIONS: None immediate. PROCEDURE: Informed written consent was obtained from the patient after a thorough discussion of the procedural risks, benefits and alternatives. All questions were addressed. Maximal Sterile Barrier Technique was utilized including caps, mask, sterile gowns, sterile gloves, sterile drape, hand hygiene and skin antiseptic. A timeout was performed prior to the initiation of the procedure. The back was prepped with ChloraPrep in a sterile fashion, and a sterile drape was applied covering the operative field. A sterile gown and sterile gloves were used for the procedure. Under sonographic guidance, 2 16 gauge core biopsies of the lower pole left renal cortex were obtained. Final imaging was performed. Patient tolerated the procedure well without complication. Vital sign monitoring by nursing staff during the  procedure will continue as patient is in the special procedures unit for post procedure observation. FINDINGS: The images document guide needle placement within the left lower pole renal cortex. Post biopsy images demonstrate no hemorrhage. IMPRESSION: Successful ultrasound-guided core biopsy of the left lower pole renal cortex. Electronically Signed   By: Marybelle Killings M.D.   On: 03/14/2016 13:54      Subjective: Pt says that his nausea has resolved now.  He would like to go home.  He says that he will follow up with nephrology as recommended.   Discharge Exam: Vitals:   03/16/16 0440 03/16/16 0838  BP: (!) 172/67 (!) 166/62  Pulse: 68 71  Resp: 20   Temp: 98.2 F (36.8 C)    Vitals:   03/15/16 1539 03/15/16 2052 03/16/16 0440 03/16/16 0838  BP: (!) 164/57 (!) 175/57 (!) 172/67 (!) 166/62  Pulse: 65 73 68 71  Resp: $Remo'18 20 20   'YCXGS$ Temp:  99.1 F (37.3 C) 98.2 F (36.8 C)   TempSrc:  Oral Oral   SpO2: 97% 98% 97% 96%  Weight:   68.9 kg (152 lb)   Height:  General: Pt is alert, awake, not in acute distress Cardiovascular: RRR, S1/S2 +, no rubs, no gallops Respiratory: CTA bilaterally, no wheezing, no rhonchi Abdominal: Soft, NT, ND, bowel sounds + Extremities: no edema, no cyanosis  The results of significant diagnostics from this hospitalization (including imaging, microbiology, ancillary and laboratory) are listed below for reference.     Microbiology: Recent Results (from the past 240 hour(s))  Culture, Urine     Status: Abnormal   Collection Time: 03/10/16  7:06 PM  Result Value Ref Range Status   Specimen Description URINE, CLEAN CATCH  Final   Special Requests NONE  Final   Culture 10,000 COLONIES/mL ENTEROCOCCUS FAECALIS (A)  Final   Report Status 03/13/2016 FINAL  Final   Organism ID, Bacteria ENTEROCOCCUS FAECALIS (A)  Final      Susceptibility   Enterococcus faecalis - MIC*    AMPICILLIN <=2 SENSITIVE Sensitive     LEVOFLOXACIN 1 SENSITIVE Sensitive      NITROFURANTOIN <=16 SENSITIVE Sensitive     VANCOMYCIN 1 SENSITIVE Sensitive     * 10,000 COLONIES/mL ENTEROCOCCUS FAECALIS     Labs: BNP (last 3 results) No results for input(s): BNP in the last 8760 hours. Basic Metabolic Panel:  Recent Labs Lab 03/10/16 0426  03/12/16 0900 03/13/16 0855 03/14/16 0339 03/15/16 0918 03/16/16 0951  NA 134*  < > 140 138 136 138 138  K 5.3*  < > 4.0 3.8 3.7 3.8 3.7  CL 106  < > 108 107 104 105 107  CO2 18*  < > 20* 18* 20* 21* 21*  GLUCOSE 360*  < > 125* 124* 140* 150* 184*  BUN 61*  < > 61* 61* 61* 60* 58*  CREATININE 5.11*  < > 5.34* 5.68* 5.79* 5.90* 5.93*  CALCIUM 7.4*  < > 7.6* 7.4* 7.6* 7.7* 7.6*  MG 1.6*  --   --  1.7 2.3 2.2  --   PHOS 5.5*  --   --   --   --  6.5* 5.7*  < > = values in this interval not displayed. Liver Function Tests:  Recent Labs Lab 03/10/16 0426 03/15/16 0918 03/16/16 0951  AST 13*  --   --   ALT 13*  --   --   ALKPHOS 74  --   --   BILITOT 0.2*  --   --   PROT 5.4*  --   --   ALBUMIN 2.3* 2.4* 2.2*   No results for input(s): LIPASE, AMYLASE in the last 168 hours. No results for input(s): AMMONIA in the last 168 hours. CBC:  Recent Labs Lab 03/10/16 0426 03/11/16 0412 03/14/16 0339 03/15/16 0918 03/16/16 0951  WBC 7.0 12.7* 9.0 9.6 9.6  NEUTROABS 6.3  --   --   --   --   HGB 8.0* 9.3* 8.3* 8.8* 8.7*  HCT 23.3* 27.0* 23.8* 25.7* 25.2*  MCV 88.6 90.6 89.5 90.2 89.0  PLT 202 232 151 172 191   Cardiac Enzymes:  Recent Labs Lab 03/13/16 0855  TROPONINI <0.03   BNP: Invalid input(s): POCBNP CBG:  Recent Labs Lab 03/15/16 1232 03/15/16 1744 03/15/16 2146 03/16/16 0805 03/16/16 1138  GLUCAP 145* 206* 160* 116* 211*   D-Dimer No results for input(s): DDIMER in the last 72 hours. Hgb A1c No results for input(s): HGBA1C in the last 72 hours. Lipid Profile No results for input(s): CHOL, HDL, LDLCALC, TRIG, CHOLHDL, LDLDIRECT in the last 72 hours. Thyroid function studies No  results for input(s): TSH,  T4TOTAL, T3FREE, THYROIDAB in the last 72 hours.  Invalid input(s): FREET3 Anemia work up No results for input(s): VITAMINB12, FOLATE, FERRITIN, TIBC, IRON, RETICCTPCT in the last 72 hours. Urinalysis    Component Value Date/Time   COLORURINE STRAW (A) 03/10/2016 1905   APPEARANCEUR CLEAR 03/10/2016 1905   LABSPEC 1.011 03/10/2016 1905   PHURINE 6.0 03/10/2016 1905   GLUCOSEU >=500 (A) 03/10/2016 1905   HGBUR SMALL (A) 03/10/2016 1905   BILIRUBINUR NEGATIVE 03/10/2016 1905   KETONESUR NEGATIVE 03/10/2016 1905   PROTEINUR >=300 (A) 03/10/2016 1905   NITRITE NEGATIVE 03/10/2016 1905   LEUKOCYTESUR NEGATIVE 03/10/2016 1905   Sepsis Labs Invalid input(s): PROCALCITONIN,  WBC,  LACTICIDVEN Microbiology Recent Results (from the past 240 hour(s))  Culture, Urine     Status: Abnormal   Collection Time: 03/10/16  7:06 PM  Result Value Ref Range Status   Specimen Description URINE, CLEAN CATCH  Final   Special Requests NONE  Final   Culture 10,000 COLONIES/mL ENTEROCOCCUS FAECALIS (A)  Final   Report Status 03/13/2016 FINAL  Final   Organism ID, Bacteria ENTEROCOCCUS FAECALIS (A)  Final      Susceptibility   Enterococcus faecalis - MIC*    AMPICILLIN <=2 SENSITIVE Sensitive     LEVOFLOXACIN 1 SENSITIVE Sensitive     NITROFURANTOIN <=16 SENSITIVE Sensitive     VANCOMYCIN 1 SENSITIVE Sensitive     * 10,000 COLONIES/mL ENTEROCOCCUS FAECALIS   Time coordinating discharge: 35 minutes  SIGNED:  Irwin Brakeman, MD  [facility] 03/16/2016, 12:34 PM Pager   If 7PM-7AM, please contact night-coverage [email or web address] Password [email or web address]

## 2016-03-16 NOTE — Progress Notes (Signed)
OT Cancellation Note  Patient Details Name: Alexander Whitaker MRN: [id number] DOB: 1950-12-22   Cancelled Treatment:    Reason Eval/Treat Not Completed: OT screened, no needs identified, will sign off. Pt preparing for d/c home. Reports he feels as if he has returned to baseline without any acute OT needs. Please re-consult if needs change. Thank you for this referral.  Binnie Kand M.S., OTR/L Pager: [phone]  03/16/2016, 2:18 PM

## 2016-03-16 NOTE — Plan of Care (Signed)
Problem: Food- and Nutrition-Related Knowledge Deficit (NB-1.1) Goal: Nutrition education Formal process to instruct or train a patient/client in a skill or to impart knowledge to help patients/clients voluntarily manage or modify food choices and eating behavior to maintain or improve health. Outcome: Adequate for Discharge Nutrition Education Note  RD consulted for assessment of nutrition requirements/status.   Pt wife requesting education on renal diet. She shares that pt has not bee eating well during hospitalization because he does not like the food. PTA, appetite was good; pt describes that he eats "garden food" (pt has a garden that provides the majority of his nutrition). Pt with good DM control. Pt and wife's largest concern was pt's ability to select appropriate menu items for their holiday gathering at the local steakhouse.  Provided "Acute Kidney Injury Nutrition Therapy" from [facility] Nutrition Care Manual and "Food Pyramid for Healthy Eating With Kidney Disease". Reviewed food groups and provided written recommended serving sizes specifically determined for patient's current nutritional status.   Explained why diet restrictions are needed and provided lists of foods to limit/avoid that are high potassium, sodium, and phosphorus. Provided specific recommendations on safer alternatives of these foods. Strongly encouraged compliance of this diet.   Discussed importance of protein intake at each meal and snack. Provided examples of how to maximize protein intake throughout the day. Discussed need for fluid restriction with dialysis, importance of minimizing weight gain between HD treatments, and renal-friendly beverage options.   Teach back method used.  Expect fair compliance.  Body mass index is 25.29 kg/m. Pt meets criteria for overweight based on current BMI.  Current diet order is renal/carb modified, patient is consuming approximately 25-75% of meals at this time. Labs and  medications reviewed. No further nutrition interventions warranted at this time. RD contact information provided. If additional nutrition issues arise, please re-consult RD.  Jeramey Lanuza A. Jimmye Norman, RD, LDN, CDE Pager: [phone] After hours Pager: [phone]

## 2016-03-22 ENCOUNTER — Encounter (HOSPITAL_COMMUNITY): Payer: Self-pay

## 2016-03-24 ENCOUNTER — Encounter (HOSPITAL_COMMUNITY): Payer: Self-pay

## 2016-04-01 ENCOUNTER — Encounter (HOSPITAL_COMMUNITY): Payer: Self-pay

## 2016-04-11 DIAGNOSIS — I129 Hypertensive chronic kidney disease with stage 1 through stage 4 chronic kidney disease, or unspecified chronic kidney disease: Secondary | ICD-10-CM | POA: Diagnosis not present

## 2016-04-11 DIAGNOSIS — E785 Hyperlipidemia, unspecified: Secondary | ICD-10-CM | POA: Diagnosis not present

## 2016-04-11 DIAGNOSIS — E1129 Type 2 diabetes mellitus with other diabetic kidney complication: Secondary | ICD-10-CM | POA: Diagnosis not present

## 2016-04-11 DIAGNOSIS — N401 Enlarged prostate with lower urinary tract symptoms: Secondary | ICD-10-CM | POA: Diagnosis not present

## 2016-04-11 DIAGNOSIS — D638 Anemia in other chronic diseases classified elsewhere: Secondary | ICD-10-CM | POA: Diagnosis not present

## 2016-04-11 DIAGNOSIS — E039 Hypothyroidism, unspecified: Secondary | ICD-10-CM | POA: Diagnosis not present

## 2016-04-11 DIAGNOSIS — Z125 Encounter for screening for malignant neoplasm of prostate: Secondary | ICD-10-CM | POA: Diagnosis not present

## 2016-04-11 DIAGNOSIS — N185 Chronic kidney disease, stage 5: Secondary | ICD-10-CM | POA: Diagnosis not present

## 2016-04-11 DIAGNOSIS — R110 Nausea: Secondary | ICD-10-CM | POA: Diagnosis not present

## 2016-04-19 DIAGNOSIS — N184 Chronic kidney disease, stage 4 (severe): Secondary | ICD-10-CM | POA: Diagnosis not present

## 2016-04-21 DIAGNOSIS — N2581 Secondary hyperparathyroidism of renal origin: Secondary | ICD-10-CM | POA: Diagnosis not present

## 2016-04-21 DIAGNOSIS — N185 Chronic kidney disease, stage 5: Secondary | ICD-10-CM | POA: Diagnosis not present

## 2016-04-21 DIAGNOSIS — D631 Anemia in chronic kidney disease: Secondary | ICD-10-CM | POA: Diagnosis not present

## 2016-04-25 DIAGNOSIS — E663 Overweight: Secondary | ICD-10-CM | POA: Diagnosis not present

## 2016-04-25 DIAGNOSIS — Z6825 Body mass index (BMI) 25.0-25.9, adult: Secondary | ICD-10-CM | POA: Diagnosis not present

## 2016-04-25 DIAGNOSIS — I129 Hypertensive chronic kidney disease with stage 1 through stage 4 chronic kidney disease, or unspecified chronic kidney disease: Secondary | ICD-10-CM | POA: Diagnosis not present

## 2016-05-09 DIAGNOSIS — R110 Nausea: Secondary | ICD-10-CM | POA: Diagnosis not present

## 2016-05-09 DIAGNOSIS — Z720 Tobacco use: Secondary | ICD-10-CM | POA: Diagnosis not present

## 2016-05-09 DIAGNOSIS — Z6825 Body mass index (BMI) 25.0-25.9, adult: Secondary | ICD-10-CM | POA: Diagnosis not present

## 2016-05-09 DIAGNOSIS — I129 Hypertensive chronic kidney disease with stage 1 through stage 4 chronic kidney disease, or unspecified chronic kidney disease: Secondary | ICD-10-CM | POA: Diagnosis not present

## 2016-05-09 DIAGNOSIS — E039 Hypothyroidism, unspecified: Secondary | ICD-10-CM | POA: Diagnosis not present

## 2016-05-09 DIAGNOSIS — Z9181 History of falling: Secondary | ICD-10-CM | POA: Diagnosis not present

## 2016-05-18 DIAGNOSIS — E119 Type 2 diabetes mellitus without complications: Secondary | ICD-10-CM | POA: Diagnosis not present

## 2016-05-24 DIAGNOSIS — H2511 Age-related nuclear cataract, right eye: Secondary | ICD-10-CM | POA: Diagnosis not present

## 2016-05-24 DIAGNOSIS — I739 Peripheral vascular disease, unspecified: Secondary | ICD-10-CM | POA: Diagnosis not present

## 2016-05-24 DIAGNOSIS — E785 Hyperlipidemia, unspecified: Secondary | ICD-10-CM | POA: Diagnosis not present

## 2016-05-24 DIAGNOSIS — Z7982 Long term (current) use of aspirin: Secondary | ICD-10-CM | POA: Diagnosis not present

## 2016-05-24 DIAGNOSIS — I509 Heart failure, unspecified: Secondary | ICD-10-CM | POA: Diagnosis not present

## 2016-05-24 DIAGNOSIS — E039 Hypothyroidism, unspecified: Secondary | ICD-10-CM | POA: Diagnosis not present

## 2016-05-24 DIAGNOSIS — E119 Type 2 diabetes mellitus without complications: Secondary | ICD-10-CM | POA: Diagnosis not present

## 2016-05-24 DIAGNOSIS — I110 Hypertensive heart disease with heart failure: Secondary | ICD-10-CM | POA: Diagnosis not present

## 2016-05-24 DIAGNOSIS — Z794 Long term (current) use of insulin: Secondary | ICD-10-CM | POA: Diagnosis not present

## 2016-05-27 DIAGNOSIS — D631 Anemia in chronic kidney disease: Secondary | ICD-10-CM | POA: Diagnosis not present

## 2016-05-27 DIAGNOSIS — N189 Chronic kidney disease, unspecified: Secondary | ICD-10-CM | POA: Diagnosis not present

## 2016-06-08 DIAGNOSIS — I120 Hypertensive chronic kidney disease with stage 5 chronic kidney disease or end stage renal disease: Secondary | ICD-10-CM | POA: Diagnosis not present

## 2016-06-08 DIAGNOSIS — F17210 Nicotine dependence, cigarettes, uncomplicated: Secondary | ICD-10-CM | POA: Diagnosis not present

## 2016-06-08 DIAGNOSIS — E039 Hypothyroidism, unspecified: Secondary | ICD-10-CM | POA: Diagnosis not present

## 2016-06-08 DIAGNOSIS — Z79899 Other long term (current) drug therapy: Secondary | ICD-10-CM | POA: Diagnosis not present

## 2016-06-08 DIAGNOSIS — Z8673 Personal history of transient ischemic attack (TIA), and cerebral infarction without residual deficits: Secondary | ICD-10-CM | POA: Diagnosis not present

## 2016-06-08 DIAGNOSIS — G2581 Restless legs syndrome: Secondary | ICD-10-CM | POA: Diagnosis not present

## 2016-06-08 DIAGNOSIS — Z01818 Encounter for other preprocedural examination: Secondary | ICD-10-CM | POA: Diagnosis not present

## 2016-06-08 DIAGNOSIS — N186 End stage renal disease: Secondary | ICD-10-CM | POA: Diagnosis not present

## 2016-06-08 DIAGNOSIS — E1122 Type 2 diabetes mellitus with diabetic chronic kidney disease: Secondary | ICD-10-CM | POA: Diagnosis not present

## 2016-06-10 DIAGNOSIS — N179 Acute kidney failure, unspecified: Secondary | ICD-10-CM | POA: Diagnosis not present

## 2016-06-20 ENCOUNTER — Other Ambulatory Visit: Payer: Self-pay | Admitting: Vascular Surgery

## 2016-06-20 DIAGNOSIS — N185 Chronic kidney disease, stage 5: Secondary | ICD-10-CM

## 2016-06-21 DIAGNOSIS — D631 Anemia in chronic kidney disease: Secondary | ICD-10-CM | POA: Diagnosis not present

## 2016-06-21 DIAGNOSIS — N189 Chronic kidney disease, unspecified: Secondary | ICD-10-CM | POA: Diagnosis not present

## 2016-06-23 DIAGNOSIS — E113313 Type 2 diabetes mellitus with moderate nonproliferative diabetic retinopathy with macular edema, bilateral: Secondary | ICD-10-CM | POA: Diagnosis not present

## 2016-06-29 DIAGNOSIS — E1129 Type 2 diabetes mellitus with other diabetic kidney complication: Secondary | ICD-10-CM | POA: Diagnosis not present

## 2016-06-29 DIAGNOSIS — D631 Anemia in chronic kidney disease: Secondary | ICD-10-CM | POA: Diagnosis not present

## 2016-06-29 DIAGNOSIS — E8770 Fluid overload, unspecified: Secondary | ICD-10-CM | POA: Diagnosis not present

## 2016-06-29 DIAGNOSIS — N185 Chronic kidney disease, stage 5: Secondary | ICD-10-CM | POA: Diagnosis not present

## 2016-06-29 DIAGNOSIS — N2581 Secondary hyperparathyroidism of renal origin: Secondary | ICD-10-CM | POA: Diagnosis not present

## 2016-07-07 DIAGNOSIS — E113313 Type 2 diabetes mellitus with moderate nonproliferative diabetic retinopathy with macular edema, bilateral: Secondary | ICD-10-CM | POA: Diagnosis not present

## 2016-07-22 DIAGNOSIS — D631 Anemia in chronic kidney disease: Secondary | ICD-10-CM | POA: Diagnosis not present

## 2016-07-22 DIAGNOSIS — N189 Chronic kidney disease, unspecified: Secondary | ICD-10-CM | POA: Diagnosis not present

## 2016-07-25 DIAGNOSIS — N189 Chronic kidney disease, unspecified: Secondary | ICD-10-CM | POA: Diagnosis not present

## 2016-07-25 DIAGNOSIS — D631 Anemia in chronic kidney disease: Secondary | ICD-10-CM | POA: Diagnosis not present

## 2016-07-26 DIAGNOSIS — N189 Chronic kidney disease, unspecified: Secondary | ICD-10-CM | POA: Diagnosis not present

## 2016-07-26 DIAGNOSIS — D631 Anemia in chronic kidney disease: Secondary | ICD-10-CM | POA: Diagnosis not present

## 2016-07-27 ENCOUNTER — Other Ambulatory Visit (HOSPITAL_COMMUNITY): Payer: Medicare HMO

## 2016-07-27 ENCOUNTER — Encounter (HOSPITAL_COMMUNITY): Payer: Medicare HMO

## 2016-07-27 ENCOUNTER — Encounter: Payer: Medicare HMO | Admitting: Vascular Surgery

## 2016-08-04 DIAGNOSIS — N185 Chronic kidney disease, stage 5: Secondary | ICD-10-CM | POA: Diagnosis not present

## 2016-08-04 DIAGNOSIS — N186 End stage renal disease: Secondary | ICD-10-CM | POA: Diagnosis not present

## 2016-08-04 DIAGNOSIS — E8770 Fluid overload, unspecified: Secondary | ICD-10-CM | POA: Diagnosis not present

## 2016-08-04 DIAGNOSIS — N2581 Secondary hyperparathyroidism of renal origin: Secondary | ICD-10-CM | POA: Diagnosis not present

## 2016-08-04 DIAGNOSIS — D631 Anemia in chronic kidney disease: Secondary | ICD-10-CM | POA: Diagnosis not present

## 2016-08-05 DIAGNOSIS — Z992 Dependence on renal dialysis: Secondary | ICD-10-CM | POA: Diagnosis not present

## 2016-08-05 DIAGNOSIS — N186 End stage renal disease: Secondary | ICD-10-CM | POA: Diagnosis not present

## 2016-08-05 DIAGNOSIS — I871 Compression of vein: Secondary | ICD-10-CM | POA: Diagnosis not present

## 2016-08-08 DIAGNOSIS — D509 Iron deficiency anemia, unspecified: Secondary | ICD-10-CM | POA: Diagnosis not present

## 2016-08-08 DIAGNOSIS — Z23 Encounter for immunization: Secondary | ICD-10-CM | POA: Diagnosis not present

## 2016-08-08 DIAGNOSIS — N186 End stage renal disease: Secondary | ICD-10-CM | POA: Diagnosis not present

## 2016-08-08 DIAGNOSIS — D689 Coagulation defect, unspecified: Secondary | ICD-10-CM | POA: Diagnosis not present

## 2016-08-08 DIAGNOSIS — E1129 Type 2 diabetes mellitus with other diabetic kidney complication: Secondary | ICD-10-CM | POA: Diagnosis not present

## 2016-08-08 DIAGNOSIS — N2581 Secondary hyperparathyroidism of renal origin: Secondary | ICD-10-CM | POA: Diagnosis not present

## 2016-08-08 DIAGNOSIS — Z111 Encounter for screening for respiratory tuberculosis: Secondary | ICD-10-CM | POA: Diagnosis not present

## 2016-08-08 DIAGNOSIS — D631 Anemia in chronic kidney disease: Secondary | ICD-10-CM | POA: Diagnosis not present

## 2016-08-11 DIAGNOSIS — Z23 Encounter for immunization: Secondary | ICD-10-CM | POA: Diagnosis not present

## 2016-08-11 DIAGNOSIS — Z111 Encounter for screening for respiratory tuberculosis: Secondary | ICD-10-CM | POA: Diagnosis not present

## 2016-08-11 DIAGNOSIS — D631 Anemia in chronic kidney disease: Secondary | ICD-10-CM | POA: Diagnosis not present

## 2016-08-11 DIAGNOSIS — D509 Iron deficiency anemia, unspecified: Secondary | ICD-10-CM | POA: Diagnosis not present

## 2016-08-11 DIAGNOSIS — N186 End stage renal disease: Secondary | ICD-10-CM | POA: Diagnosis not present

## 2016-08-11 DIAGNOSIS — E1129 Type 2 diabetes mellitus with other diabetic kidney complication: Secondary | ICD-10-CM | POA: Diagnosis not present

## 2016-08-11 DIAGNOSIS — N2581 Secondary hyperparathyroidism of renal origin: Secondary | ICD-10-CM | POA: Diagnosis not present

## 2016-08-11 DIAGNOSIS — D689 Coagulation defect, unspecified: Secondary | ICD-10-CM | POA: Diagnosis not present

## 2016-08-13 DIAGNOSIS — Z23 Encounter for immunization: Secondary | ICD-10-CM | POA: Diagnosis not present

## 2016-08-13 DIAGNOSIS — D509 Iron deficiency anemia, unspecified: Secondary | ICD-10-CM | POA: Diagnosis not present

## 2016-08-13 DIAGNOSIS — N186 End stage renal disease: Secondary | ICD-10-CM | POA: Diagnosis not present

## 2016-08-13 DIAGNOSIS — D631 Anemia in chronic kidney disease: Secondary | ICD-10-CM | POA: Diagnosis not present

## 2016-08-13 DIAGNOSIS — Z111 Encounter for screening for respiratory tuberculosis: Secondary | ICD-10-CM | POA: Diagnosis not present

## 2016-08-13 DIAGNOSIS — N2581 Secondary hyperparathyroidism of renal origin: Secondary | ICD-10-CM | POA: Diagnosis not present

## 2016-08-13 DIAGNOSIS — D689 Coagulation defect, unspecified: Secondary | ICD-10-CM | POA: Diagnosis not present

## 2016-08-13 DIAGNOSIS — E1129 Type 2 diabetes mellitus with other diabetic kidney complication: Secondary | ICD-10-CM | POA: Diagnosis not present

## 2016-08-16 DIAGNOSIS — N2581 Secondary hyperparathyroidism of renal origin: Secondary | ICD-10-CM | POA: Diagnosis not present

## 2016-08-16 DIAGNOSIS — Z23 Encounter for immunization: Secondary | ICD-10-CM | POA: Diagnosis not present

## 2016-08-16 DIAGNOSIS — Z111 Encounter for screening for respiratory tuberculosis: Secondary | ICD-10-CM | POA: Diagnosis not present

## 2016-08-16 DIAGNOSIS — D689 Coagulation defect, unspecified: Secondary | ICD-10-CM | POA: Diagnosis not present

## 2016-08-16 DIAGNOSIS — D631 Anemia in chronic kidney disease: Secondary | ICD-10-CM | POA: Diagnosis not present

## 2016-08-16 DIAGNOSIS — E1129 Type 2 diabetes mellitus with other diabetic kidney complication: Secondary | ICD-10-CM | POA: Diagnosis not present

## 2016-08-16 DIAGNOSIS — N186 End stage renal disease: Secondary | ICD-10-CM | POA: Diagnosis not present

## 2016-08-16 DIAGNOSIS — D509 Iron deficiency anemia, unspecified: Secondary | ICD-10-CM | POA: Diagnosis not present

## 2016-08-17 DIAGNOSIS — E1165 Type 2 diabetes mellitus with hyperglycemia: Secondary | ICD-10-CM | POA: Diagnosis not present

## 2016-08-17 DIAGNOSIS — I129 Hypertensive chronic kidney disease with stage 1 through stage 4 chronic kidney disease, or unspecified chronic kidney disease: Secondary | ICD-10-CM | POA: Diagnosis not present

## 2016-08-17 DIAGNOSIS — D638 Anemia in other chronic diseases classified elsewhere: Secondary | ICD-10-CM | POA: Diagnosis not present

## 2016-08-17 DIAGNOSIS — E785 Hyperlipidemia, unspecified: Secondary | ICD-10-CM | POA: Diagnosis not present

## 2016-08-17 DIAGNOSIS — E039 Hypothyroidism, unspecified: Secondary | ICD-10-CM | POA: Diagnosis not present

## 2016-08-17 DIAGNOSIS — N185 Chronic kidney disease, stage 5: Secondary | ICD-10-CM | POA: Diagnosis not present

## 2016-08-17 DIAGNOSIS — Z6824 Body mass index (BMI) 24.0-24.9, adult: Secondary | ICD-10-CM | POA: Diagnosis not present

## 2016-08-17 DIAGNOSIS — E1129 Type 2 diabetes mellitus with other diabetic kidney complication: Secondary | ICD-10-CM | POA: Diagnosis not present

## 2016-08-18 DIAGNOSIS — Z111 Encounter for screening for respiratory tuberculosis: Secondary | ICD-10-CM | POA: Diagnosis not present

## 2016-08-18 DIAGNOSIS — N186 End stage renal disease: Secondary | ICD-10-CM | POA: Diagnosis not present

## 2016-08-18 DIAGNOSIS — Z23 Encounter for immunization: Secondary | ICD-10-CM | POA: Diagnosis not present

## 2016-08-18 DIAGNOSIS — D509 Iron deficiency anemia, unspecified: Secondary | ICD-10-CM | POA: Diagnosis not present

## 2016-08-18 DIAGNOSIS — D631 Anemia in chronic kidney disease: Secondary | ICD-10-CM | POA: Diagnosis not present

## 2016-08-18 DIAGNOSIS — D689 Coagulation defect, unspecified: Secondary | ICD-10-CM | POA: Diagnosis not present

## 2016-08-18 DIAGNOSIS — E1129 Type 2 diabetes mellitus with other diabetic kidney complication: Secondary | ICD-10-CM | POA: Diagnosis not present

## 2016-08-18 DIAGNOSIS — N2581 Secondary hyperparathyroidism of renal origin: Secondary | ICD-10-CM | POA: Diagnosis not present

## 2016-08-19 DIAGNOSIS — D631 Anemia in chronic kidney disease: Secondary | ICD-10-CM | POA: Diagnosis not present

## 2016-08-19 DIAGNOSIS — N189 Chronic kidney disease, unspecified: Secondary | ICD-10-CM | POA: Diagnosis not present

## 2016-08-20 DIAGNOSIS — D689 Coagulation defect, unspecified: Secondary | ICD-10-CM | POA: Diagnosis not present

## 2016-08-20 DIAGNOSIS — Z111 Encounter for screening for respiratory tuberculosis: Secondary | ICD-10-CM | POA: Diagnosis not present

## 2016-08-20 DIAGNOSIS — N2581 Secondary hyperparathyroidism of renal origin: Secondary | ICD-10-CM | POA: Diagnosis not present

## 2016-08-20 DIAGNOSIS — N186 End stage renal disease: Secondary | ICD-10-CM | POA: Diagnosis not present

## 2016-08-20 DIAGNOSIS — D509 Iron deficiency anemia, unspecified: Secondary | ICD-10-CM | POA: Diagnosis not present

## 2016-08-20 DIAGNOSIS — Z23 Encounter for immunization: Secondary | ICD-10-CM | POA: Diagnosis not present

## 2016-08-20 DIAGNOSIS — E1129 Type 2 diabetes mellitus with other diabetic kidney complication: Secondary | ICD-10-CM | POA: Diagnosis not present

## 2016-08-20 DIAGNOSIS — D631 Anemia in chronic kidney disease: Secondary | ICD-10-CM | POA: Diagnosis not present

## 2016-08-23 DIAGNOSIS — Z111 Encounter for screening for respiratory tuberculosis: Secondary | ICD-10-CM | POA: Diagnosis not present

## 2016-08-23 DIAGNOSIS — D689 Coagulation defect, unspecified: Secondary | ICD-10-CM | POA: Diagnosis not present

## 2016-08-23 DIAGNOSIS — D631 Anemia in chronic kidney disease: Secondary | ICD-10-CM | POA: Diagnosis not present

## 2016-08-23 DIAGNOSIS — N186 End stage renal disease: Secondary | ICD-10-CM | POA: Diagnosis not present

## 2016-08-23 DIAGNOSIS — D509 Iron deficiency anemia, unspecified: Secondary | ICD-10-CM | POA: Diagnosis not present

## 2016-08-23 DIAGNOSIS — N2581 Secondary hyperparathyroidism of renal origin: Secondary | ICD-10-CM | POA: Diagnosis not present

## 2016-08-23 DIAGNOSIS — E1129 Type 2 diabetes mellitus with other diabetic kidney complication: Secondary | ICD-10-CM | POA: Diagnosis not present

## 2016-08-23 DIAGNOSIS — Z23 Encounter for immunization: Secondary | ICD-10-CM | POA: Diagnosis not present

## 2016-08-25 DIAGNOSIS — Z23 Encounter for immunization: Secondary | ICD-10-CM | POA: Diagnosis not present

## 2016-08-25 DIAGNOSIS — D689 Coagulation defect, unspecified: Secondary | ICD-10-CM | POA: Diagnosis not present

## 2016-08-25 DIAGNOSIS — Z111 Encounter for screening for respiratory tuberculosis: Secondary | ICD-10-CM | POA: Diagnosis not present

## 2016-08-25 DIAGNOSIS — Z992 Dependence on renal dialysis: Secondary | ICD-10-CM | POA: Diagnosis not present

## 2016-08-25 DIAGNOSIS — E1129 Type 2 diabetes mellitus with other diabetic kidney complication: Secondary | ICD-10-CM | POA: Diagnosis not present

## 2016-08-25 DIAGNOSIS — D631 Anemia in chronic kidney disease: Secondary | ICD-10-CM | POA: Diagnosis not present

## 2016-08-25 DIAGNOSIS — N186 End stage renal disease: Secondary | ICD-10-CM | POA: Diagnosis not present

## 2016-08-25 DIAGNOSIS — N2581 Secondary hyperparathyroidism of renal origin: Secondary | ICD-10-CM | POA: Diagnosis not present

## 2016-08-25 DIAGNOSIS — D509 Iron deficiency anemia, unspecified: Secondary | ICD-10-CM | POA: Diagnosis not present

## 2016-08-27 DIAGNOSIS — N186 End stage renal disease: Secondary | ICD-10-CM | POA: Diagnosis not present

## 2016-08-27 DIAGNOSIS — L299 Pruritus, unspecified: Secondary | ICD-10-CM | POA: Diagnosis not present

## 2016-08-27 DIAGNOSIS — N2581 Secondary hyperparathyroidism of renal origin: Secondary | ICD-10-CM | POA: Diagnosis not present

## 2016-08-27 DIAGNOSIS — E1129 Type 2 diabetes mellitus with other diabetic kidney complication: Secondary | ICD-10-CM | POA: Diagnosis not present

## 2016-08-27 DIAGNOSIS — D631 Anemia in chronic kidney disease: Secondary | ICD-10-CM | POA: Diagnosis not present

## 2016-08-27 DIAGNOSIS — Z23 Encounter for immunization: Secondary | ICD-10-CM | POA: Diagnosis not present

## 2016-08-27 DIAGNOSIS — D689 Coagulation defect, unspecified: Secondary | ICD-10-CM | POA: Diagnosis not present

## 2016-08-27 DIAGNOSIS — D509 Iron deficiency anemia, unspecified: Secondary | ICD-10-CM | POA: Diagnosis not present

## 2016-08-30 DIAGNOSIS — N2581 Secondary hyperparathyroidism of renal origin: Secondary | ICD-10-CM | POA: Diagnosis not present

## 2016-08-30 DIAGNOSIS — D631 Anemia in chronic kidney disease: Secondary | ICD-10-CM | POA: Diagnosis not present

## 2016-08-30 DIAGNOSIS — E1129 Type 2 diabetes mellitus with other diabetic kidney complication: Secondary | ICD-10-CM | POA: Diagnosis not present

## 2016-08-30 DIAGNOSIS — L299 Pruritus, unspecified: Secondary | ICD-10-CM | POA: Diagnosis not present

## 2016-08-30 DIAGNOSIS — Z23 Encounter for immunization: Secondary | ICD-10-CM | POA: Diagnosis not present

## 2016-08-30 DIAGNOSIS — N186 End stage renal disease: Secondary | ICD-10-CM | POA: Diagnosis not present

## 2016-08-30 DIAGNOSIS — D689 Coagulation defect, unspecified: Secondary | ICD-10-CM | POA: Diagnosis not present

## 2016-08-30 DIAGNOSIS — D509 Iron deficiency anemia, unspecified: Secondary | ICD-10-CM | POA: Diagnosis not present

## 2016-09-01 DIAGNOSIS — N186 End stage renal disease: Secondary | ICD-10-CM | POA: Diagnosis not present

## 2016-09-01 DIAGNOSIS — L299 Pruritus, unspecified: Secondary | ICD-10-CM | POA: Diagnosis not present

## 2016-09-01 DIAGNOSIS — D631 Anemia in chronic kidney disease: Secondary | ICD-10-CM | POA: Diagnosis not present

## 2016-09-01 DIAGNOSIS — Z23 Encounter for immunization: Secondary | ICD-10-CM | POA: Diagnosis not present

## 2016-09-01 DIAGNOSIS — E1129 Type 2 diabetes mellitus with other diabetic kidney complication: Secondary | ICD-10-CM | POA: Diagnosis not present

## 2016-09-01 DIAGNOSIS — N2581 Secondary hyperparathyroidism of renal origin: Secondary | ICD-10-CM | POA: Diagnosis not present

## 2016-09-01 DIAGNOSIS — D509 Iron deficiency anemia, unspecified: Secondary | ICD-10-CM | POA: Diagnosis not present

## 2016-09-01 DIAGNOSIS — D689 Coagulation defect, unspecified: Secondary | ICD-10-CM | POA: Diagnosis not present

## 2016-09-03 DIAGNOSIS — E1129 Type 2 diabetes mellitus with other diabetic kidney complication: Secondary | ICD-10-CM | POA: Diagnosis not present

## 2016-09-03 DIAGNOSIS — L299 Pruritus, unspecified: Secondary | ICD-10-CM | POA: Diagnosis not present

## 2016-09-03 DIAGNOSIS — D509 Iron deficiency anemia, unspecified: Secondary | ICD-10-CM | POA: Diagnosis not present

## 2016-09-03 DIAGNOSIS — N186 End stage renal disease: Secondary | ICD-10-CM | POA: Diagnosis not present

## 2016-09-03 DIAGNOSIS — N2581 Secondary hyperparathyroidism of renal origin: Secondary | ICD-10-CM | POA: Diagnosis not present

## 2016-09-03 DIAGNOSIS — D689 Coagulation defect, unspecified: Secondary | ICD-10-CM | POA: Diagnosis not present

## 2016-09-03 DIAGNOSIS — Z23 Encounter for immunization: Secondary | ICD-10-CM | POA: Diagnosis not present

## 2016-09-03 DIAGNOSIS — D631 Anemia in chronic kidney disease: Secondary | ICD-10-CM | POA: Diagnosis not present

## 2016-09-06 DIAGNOSIS — E1129 Type 2 diabetes mellitus with other diabetic kidney complication: Secondary | ICD-10-CM | POA: Diagnosis not present

## 2016-09-06 DIAGNOSIS — D689 Coagulation defect, unspecified: Secondary | ICD-10-CM | POA: Diagnosis not present

## 2016-09-06 DIAGNOSIS — Z23 Encounter for immunization: Secondary | ICD-10-CM | POA: Diagnosis not present

## 2016-09-06 DIAGNOSIS — N186 End stage renal disease: Secondary | ICD-10-CM | POA: Diagnosis not present

## 2016-09-06 DIAGNOSIS — D509 Iron deficiency anemia, unspecified: Secondary | ICD-10-CM | POA: Diagnosis not present

## 2016-09-06 DIAGNOSIS — D631 Anemia in chronic kidney disease: Secondary | ICD-10-CM | POA: Diagnosis not present

## 2016-09-06 DIAGNOSIS — L299 Pruritus, unspecified: Secondary | ICD-10-CM | POA: Diagnosis not present

## 2016-09-06 DIAGNOSIS — N2581 Secondary hyperparathyroidism of renal origin: Secondary | ICD-10-CM | POA: Diagnosis not present

## 2016-09-07 DIAGNOSIS — Z794 Long term (current) use of insulin: Secondary | ICD-10-CM | POA: Diagnosis not present

## 2016-09-07 DIAGNOSIS — I120 Hypertensive chronic kidney disease with stage 5 chronic kidney disease or end stage renal disease: Secondary | ICD-10-CM | POA: Diagnosis not present

## 2016-09-07 DIAGNOSIS — N186 End stage renal disease: Secondary | ICD-10-CM | POA: Diagnosis not present

## 2016-09-07 DIAGNOSIS — F17210 Nicotine dependence, cigarettes, uncomplicated: Secondary | ICD-10-CM | POA: Diagnosis not present

## 2016-09-07 DIAGNOSIS — Z452 Encounter for adjustment and management of vascular access device: Secondary | ICD-10-CM | POA: Diagnosis not present

## 2016-09-07 DIAGNOSIS — Z79899 Other long term (current) drug therapy: Secondary | ICD-10-CM | POA: Diagnosis not present

## 2016-09-07 DIAGNOSIS — K5730 Diverticulosis of large intestine without perforation or abscess without bleeding: Secondary | ICD-10-CM | POA: Diagnosis not present

## 2016-09-07 DIAGNOSIS — E1122 Type 2 diabetes mellitus with diabetic chronic kidney disease: Secondary | ICD-10-CM | POA: Diagnosis not present

## 2016-09-07 DIAGNOSIS — J449 Chronic obstructive pulmonary disease, unspecified: Secondary | ICD-10-CM | POA: Diagnosis not present

## 2016-09-07 DIAGNOSIS — E039 Hypothyroidism, unspecified: Secondary | ICD-10-CM | POA: Diagnosis not present

## 2016-09-07 DIAGNOSIS — Z01818 Encounter for other preprocedural examination: Secondary | ICD-10-CM | POA: Diagnosis not present

## 2016-09-07 DIAGNOSIS — E1121 Type 2 diabetes mellitus with diabetic nephropathy: Secondary | ICD-10-CM | POA: Diagnosis not present

## 2016-09-08 DIAGNOSIS — N186 End stage renal disease: Secondary | ICD-10-CM | POA: Diagnosis not present

## 2016-09-08 DIAGNOSIS — L299 Pruritus, unspecified: Secondary | ICD-10-CM | POA: Diagnosis not present

## 2016-09-08 DIAGNOSIS — D509 Iron deficiency anemia, unspecified: Secondary | ICD-10-CM | POA: Diagnosis not present

## 2016-09-08 DIAGNOSIS — D689 Coagulation defect, unspecified: Secondary | ICD-10-CM | POA: Diagnosis not present

## 2016-09-08 DIAGNOSIS — D631 Anemia in chronic kidney disease: Secondary | ICD-10-CM | POA: Diagnosis not present

## 2016-09-08 DIAGNOSIS — E1129 Type 2 diabetes mellitus with other diabetic kidney complication: Secondary | ICD-10-CM | POA: Diagnosis not present

## 2016-09-08 DIAGNOSIS — Z23 Encounter for immunization: Secondary | ICD-10-CM | POA: Diagnosis not present

## 2016-09-08 DIAGNOSIS — N2581 Secondary hyperparathyroidism of renal origin: Secondary | ICD-10-CM | POA: Diagnosis not present

## 2016-09-09 ENCOUNTER — Encounter: Payer: Self-pay | Admitting: Vascular Surgery

## 2016-09-10 DIAGNOSIS — L299 Pruritus, unspecified: Secondary | ICD-10-CM | POA: Diagnosis not present

## 2016-09-10 DIAGNOSIS — N2581 Secondary hyperparathyroidism of renal origin: Secondary | ICD-10-CM | POA: Diagnosis not present

## 2016-09-10 DIAGNOSIS — N186 End stage renal disease: Secondary | ICD-10-CM | POA: Diagnosis not present

## 2016-09-10 DIAGNOSIS — E1129 Type 2 diabetes mellitus with other diabetic kidney complication: Secondary | ICD-10-CM | POA: Diagnosis not present

## 2016-09-10 DIAGNOSIS — Z23 Encounter for immunization: Secondary | ICD-10-CM | POA: Diagnosis not present

## 2016-09-10 DIAGNOSIS — D689 Coagulation defect, unspecified: Secondary | ICD-10-CM | POA: Diagnosis not present

## 2016-09-10 DIAGNOSIS — D631 Anemia in chronic kidney disease: Secondary | ICD-10-CM | POA: Diagnosis not present

## 2016-09-10 DIAGNOSIS — D509 Iron deficiency anemia, unspecified: Secondary | ICD-10-CM | POA: Diagnosis not present

## 2016-09-13 DIAGNOSIS — D689 Coagulation defect, unspecified: Secondary | ICD-10-CM | POA: Diagnosis not present

## 2016-09-13 DIAGNOSIS — L299 Pruritus, unspecified: Secondary | ICD-10-CM | POA: Diagnosis not present

## 2016-09-13 DIAGNOSIS — D509 Iron deficiency anemia, unspecified: Secondary | ICD-10-CM | POA: Diagnosis not present

## 2016-09-13 DIAGNOSIS — D631 Anemia in chronic kidney disease: Secondary | ICD-10-CM | POA: Diagnosis not present

## 2016-09-13 DIAGNOSIS — Z23 Encounter for immunization: Secondary | ICD-10-CM | POA: Diagnosis not present

## 2016-09-13 DIAGNOSIS — E1129 Type 2 diabetes mellitus with other diabetic kidney complication: Secondary | ICD-10-CM | POA: Diagnosis not present

## 2016-09-13 DIAGNOSIS — N2581 Secondary hyperparathyroidism of renal origin: Secondary | ICD-10-CM | POA: Diagnosis not present

## 2016-09-13 DIAGNOSIS — N186 End stage renal disease: Secondary | ICD-10-CM | POA: Diagnosis not present

## 2016-09-15 DIAGNOSIS — N186 End stage renal disease: Secondary | ICD-10-CM | POA: Diagnosis not present

## 2016-09-15 DIAGNOSIS — D689 Coagulation defect, unspecified: Secondary | ICD-10-CM | POA: Diagnosis not present

## 2016-09-15 DIAGNOSIS — Z23 Encounter for immunization: Secondary | ICD-10-CM | POA: Diagnosis not present

## 2016-09-15 DIAGNOSIS — E1129 Type 2 diabetes mellitus with other diabetic kidney complication: Secondary | ICD-10-CM | POA: Diagnosis not present

## 2016-09-15 DIAGNOSIS — D509 Iron deficiency anemia, unspecified: Secondary | ICD-10-CM | POA: Diagnosis not present

## 2016-09-15 DIAGNOSIS — L299 Pruritus, unspecified: Secondary | ICD-10-CM | POA: Diagnosis not present

## 2016-09-15 DIAGNOSIS — D631 Anemia in chronic kidney disease: Secondary | ICD-10-CM | POA: Diagnosis not present

## 2016-09-15 DIAGNOSIS — N2581 Secondary hyperparathyroidism of renal origin: Secondary | ICD-10-CM | POA: Diagnosis not present

## 2016-09-17 DIAGNOSIS — D689 Coagulation defect, unspecified: Secondary | ICD-10-CM | POA: Diagnosis not present

## 2016-09-17 DIAGNOSIS — Z23 Encounter for immunization: Secondary | ICD-10-CM | POA: Diagnosis not present

## 2016-09-17 DIAGNOSIS — N186 End stage renal disease: Secondary | ICD-10-CM | POA: Diagnosis not present

## 2016-09-17 DIAGNOSIS — N2581 Secondary hyperparathyroidism of renal origin: Secondary | ICD-10-CM | POA: Diagnosis not present

## 2016-09-17 DIAGNOSIS — D631 Anemia in chronic kidney disease: Secondary | ICD-10-CM | POA: Diagnosis not present

## 2016-09-17 DIAGNOSIS — D509 Iron deficiency anemia, unspecified: Secondary | ICD-10-CM | POA: Diagnosis not present

## 2016-09-17 DIAGNOSIS — E1129 Type 2 diabetes mellitus with other diabetic kidney complication: Secondary | ICD-10-CM | POA: Diagnosis not present

## 2016-09-17 DIAGNOSIS — L299 Pruritus, unspecified: Secondary | ICD-10-CM | POA: Diagnosis not present

## 2016-09-20 DIAGNOSIS — Z23 Encounter for immunization: Secondary | ICD-10-CM | POA: Diagnosis not present

## 2016-09-20 DIAGNOSIS — D689 Coagulation defect, unspecified: Secondary | ICD-10-CM | POA: Diagnosis not present

## 2016-09-20 DIAGNOSIS — N2581 Secondary hyperparathyroidism of renal origin: Secondary | ICD-10-CM | POA: Diagnosis not present

## 2016-09-20 DIAGNOSIS — L299 Pruritus, unspecified: Secondary | ICD-10-CM | POA: Diagnosis not present

## 2016-09-20 DIAGNOSIS — D509 Iron deficiency anemia, unspecified: Secondary | ICD-10-CM | POA: Diagnosis not present

## 2016-09-20 DIAGNOSIS — N186 End stage renal disease: Secondary | ICD-10-CM | POA: Diagnosis not present

## 2016-09-20 DIAGNOSIS — D631 Anemia in chronic kidney disease: Secondary | ICD-10-CM | POA: Diagnosis not present

## 2016-09-20 DIAGNOSIS — E1129 Type 2 diabetes mellitus with other diabetic kidney complication: Secondary | ICD-10-CM | POA: Diagnosis not present

## 2016-09-21 ENCOUNTER — Ambulatory Visit (INDEPENDENT_AMBULATORY_CARE_PROVIDER_SITE_OTHER)
Admission: RE | Admit: 2016-09-21 | Discharge: 2016-09-21 | Disposition: A | Discharge status: Home / Self Care | Payer: Medicare HMO | Source: Ambulatory Visit | Attending: Vascular Surgery | Admitting: Vascular Surgery

## 2016-09-21 ENCOUNTER — Ambulatory Visit (INDEPENDENT_AMBULATORY_CARE_PROVIDER_SITE_OTHER): Payer: Medicare HMO | Admitting: Vascular Surgery

## 2016-09-21 ENCOUNTER — Encounter: Payer: Self-pay | Admitting: Vascular Surgery

## 2016-09-21 ENCOUNTER — Ambulatory Visit (HOSPITAL_COMMUNITY)
Admission: RE | Admit: 2016-09-21 | Discharge: 2016-09-21 | Disposition: A | Discharge status: Home / Self Care | Payer: Medicare HMO | Source: Ambulatory Visit | Attending: Vascular Surgery | Admitting: Vascular Surgery

## 2016-09-21 VITALS — BP 184/79 | HR 57 | Temp 97.2°F | Resp 16 | Ht 65.0 in | Wt 149.0 lb

## 2016-09-21 DIAGNOSIS — N185 Chronic kidney disease, stage 5: Secondary | ICD-10-CM | POA: Insufficient documentation

## 2016-09-21 DIAGNOSIS — R0989 Other specified symptoms and signs involving the circulatory and respiratory systems: Secondary | ICD-10-CM

## 2016-09-21 DIAGNOSIS — N186 End stage renal disease: Secondary | ICD-10-CM

## 2016-09-21 DIAGNOSIS — Z992 Dependence on renal dialysis: Secondary | ICD-10-CM

## 2016-09-21 NOTE — Addendum Note (Signed)
Addended by: Lianne Cure A on: 09/21/2016 03:15 PM   Modules accepted: Orders

## 2016-09-21 NOTE — Progress Notes (Signed)
Patient name: Alexander Whitaker MRN: [id number] DOB: 1950/07/05 Sex: male   REASON FOR CONSULT:    Evaluate for hemodialysis access. Referred by Dr. Erling Cruz.  HPI:   Alexander Whitaker is a pleasant 66 y.o. male, Who is referred for evaluation of hemodialysis access. I have reviewed the records that were sent from [facility]. The patient has stage V chronic kidney disease. This is thought to be secondary to diabetic nephropathy. His GFR is less than 15.  He had a tunneled dialysis catheter placed at [facility] about 6 weeks ago and dialyzes on Tuesdays Thursdays and Saturdays. He denies any recent uremic symptoms. Specifically, he denies nausea, vomiting, fatigue, anorexia, or palpitations.  He is right-handed. He does not have a pacemaker.  Past Medical History:  Diagnosis Date  . CKD stage 3 due to type 2 diabetes mellitus ([place])   . CVA (cerebral vascular accident) ([place]) 2016  . Diabetes mellitus ([place])   . Hypertension   . Hypothyroidism   . Restless legs     Family History  Problem Relation Age of Onset  . Leukemia Mother 26  . CVA Father 79    SOCIAL HISTORY: Social History   Social History  . Marital status: Married    Spouse name: N/A  . Number of children: N/A  . Years of education: N/A   Occupational History  . retired    Social History Main Topics  . Smoking status: Current Every Day Smoker    Packs/day: 1.00    Years: 57.00  . Smokeless tobacco: Never Used     Comment: 1/2 pk a day or less.   . Alcohol use Yes     Comment: rare beer  . Drug use: No  . Sexual activity: Not on file   Other Topics Concern  . Not on file   Social History Narrative  . No narrative on file    No Known Allergies  Current Outpatient Prescriptions  Medication Sig Dispense Refill  . albuterol (PROVENTIL HFA;VENTOLIN HFA) 108 (90 Base) MCG/ACT inhaler Inhale 2 puffs into the lungs every 6 (six) hours as needed for wheezing or shortness of breath.      [name] amLODipine (NORVASC) 5 MG tablet Take 1 tablet (5 mg total) by mouth daily. 30 tablet 0  . hydrALAZINE (APRESOLINE) 10 MG tablet Take 10 mg by mouth 2 (two) times daily.    . Insulin Detemir (LEVEMIR) 100 UNIT/ML Pen Inject 10 Units into the skin daily at 10 pm. 15 mL 11  . levothyroxine (SYNTHROID, LEVOTHROID) 25 MCG tablet Take 25 mcg by mouth daily before breakfast.    . loratadine (CLARITIN) 10 MG tablet Take 10 mg by mouth daily.    . metoprolol tartrate (LOPRESSOR) 25 MG tablet Take 25 mg by mouth 2 (two) times daily.    . promethazine (PHENERGAN) 12.5 MG tablet Take 1 tablet (12.5 mg total) by mouth every 8 (eight) hours as needed for nausea, vomiting or refractory nausea / vomiting. 10 tablet 0  . tamsulosin (FLOMAX) 0.4 MG CAPS capsule Take 1 capsule (0.4 mg total) by mouth daily after supper. 30 capsule 0   No current facility-administered medications for this visit.     REVIEW OF SYSTEMS:  $RemoveB'[X]'OMXeXPJo$  denotes positive finding, $RemoveBeforeDEI'[ ]'BbOTCXgHkDdEonmA$  denotes negative finding Cardiac  Comments:  Chest pain or chest pressure:    Shortness of breath upon exertion:    Short of breath when lying flat:    Irregular heart rhythm:  Vascular    Pain in calf, thigh, or hip brought on by ambulation:    Pain in feet at night that wakes you up from your sleep:     Blood clot in your veins:    Leg swelling:         Pulmonary    Oxygen at home:    Productive cough:     Wheezing:         Neurologic    Sudden weakness in arms or legs:     Sudden numbness in arms or legs:     Sudden onset of difficulty speaking or slurred speech:    Temporary loss of vision in one eye:     Problems with dizziness:         Gastrointestinal    Blood in stool:     Vomited blood:         Genitourinary    Burning when urinating:     Blood in urine:        Psychiatric    Major depression:         Hematologic    Bleeding problems:    Problems with blood clotting too easily:        Skin    Rashes or ulcers:         Constitutional    Fever or chills:     PHYSICAL EXAM:   Vitals:   09/21/16 1410 09/21/16 1412  BP: (!) 190/77 (!) 184/79  Pulse: (!) 57   Resp: 16   Temp: 97.2 F (36.2 C)   TempSrc: Oral   SpO2: 98%   Weight: 149 lb (67.6 kg)   Height: $Remove'5\' 5"'yTjrVoD$  (1.651 m)     GENERAL: The patient is a well-nourished male, in no acute distress. The vital signs are documented above. CARDIAC: There is a regular rate and rhythm.  VASCULAR: He has a left carotid bruit. He has slightly diminished radial pulses bilaterally. I lifted both upper arm cephalic veins with the SonoSite myself and it appears that the right upper arm cephalic vein does appear to be better than the left. His Doppler signals in both upper extremities by my exam are equal. He has monophasic radial and ulnar signals. PULMONARY: There is good air exchange bilaterally without wheezing or rales. ABDOMEN: Soft and non-tender with normal pitched bowel sounds.  MUSCULOSKELETAL: There are no major deformities or cyanosis. NEUROLOGIC: No focal weakness or paresthesias are detected. SKIN: There are no ulcers or rashes noted. PSYCHIATRIC: The patient has a normal affect.  DATA:    BILATERAL UPPER EXTREMITY VEIN MAP: I have independently interpreted his upper extremity vein mapping.  On the left side, the forearm and upper arm cephalic vein looks marginal in size. The basilic vein is short.  On the right side the forearm cephalic vein looks marginal in size. The upper arm cephalic vein looks more reasonable in size. The basilic vein is short.  UPPER EXTREMITY ARTERIAL DUPLEX: I have independent interpreted the upper extremity arterial duplex.  On the left side there is a triphasic radial and ulnar waveform. The brachial artery is 0.53 cm in diameter.  On the right side there is a triphasic radial signal. The ulnar artery is occluded. The brachial artery is 0.4 cm in diameter.   MEDICAL ISSUES:   END-STAGE RENAL DISEASE: Based on  my exam and his noninvasive studies, I believe his best option for a fistula would be a right brachiocephalic AV fistula. I do not see any  good options for fistula in the left arm. I've explained that if the right upper arm cephalic vein is not adequate we could potentially do a first stage basilic vein transposition. If none of his veins are adequately would require placement of an AV graft. I have explained the indications for placement of an AV fistula or AV graft. I've explained that if at all possible we will place an AV fistula.  I have reviewed the risks of placement of an AV fistula including but not limited to: failure of the fistula to mature, need for subsequent interventions, and thrombosis. In addition I have reviewed the potential complications of placement of an AV graft. These risks include, but are not limited to, graft thrombosis, graft infection, wound healing problems, bleeding, arm swelling, and steal syndrome. All the patient's questions were answered and they are agreeable to proceed with surgery. This surgery is scheduled for 10/07/2016.  LEFT CAROTID BRUIT: I did detect a left carotid bruit. He tells me that he did have a left brain stroke 2 years ago. I cannot find any previous carotid duplex scans. We were unable to schedule the duplex in the office today and I will have him scheduled for a carotid duplex hopefully before his surgery.    [name] [facility] [place] [phone]

## 2016-09-22 DIAGNOSIS — L299 Pruritus, unspecified: Secondary | ICD-10-CM | POA: Diagnosis not present

## 2016-09-22 DIAGNOSIS — E113313 Type 2 diabetes mellitus with moderate nonproliferative diabetic retinopathy with macular edema, bilateral: Secondary | ICD-10-CM | POA: Diagnosis not present

## 2016-09-22 DIAGNOSIS — D631 Anemia in chronic kidney disease: Secondary | ICD-10-CM | POA: Diagnosis not present

## 2016-09-22 DIAGNOSIS — D689 Coagulation defect, unspecified: Secondary | ICD-10-CM | POA: Diagnosis not present

## 2016-09-22 DIAGNOSIS — D509 Iron deficiency anemia, unspecified: Secondary | ICD-10-CM | POA: Diagnosis not present

## 2016-09-22 DIAGNOSIS — Z23 Encounter for immunization: Secondary | ICD-10-CM | POA: Diagnosis not present

## 2016-09-22 DIAGNOSIS — N2581 Secondary hyperparathyroidism of renal origin: Secondary | ICD-10-CM | POA: Diagnosis not present

## 2016-09-22 DIAGNOSIS — N186 End stage renal disease: Secondary | ICD-10-CM | POA: Diagnosis not present

## 2016-09-22 DIAGNOSIS — E1129 Type 2 diabetes mellitus with other diabetic kidney complication: Secondary | ICD-10-CM | POA: Diagnosis not present

## 2016-09-24 DIAGNOSIS — Z23 Encounter for immunization: Secondary | ICD-10-CM | POA: Diagnosis not present

## 2016-09-24 DIAGNOSIS — Z992 Dependence on renal dialysis: Secondary | ICD-10-CM | POA: Diagnosis not present

## 2016-09-24 DIAGNOSIS — N2581 Secondary hyperparathyroidism of renal origin: Secondary | ICD-10-CM | POA: Diagnosis not present

## 2016-09-24 DIAGNOSIS — D631 Anemia in chronic kidney disease: Secondary | ICD-10-CM | POA: Diagnosis not present

## 2016-09-24 DIAGNOSIS — D509 Iron deficiency anemia, unspecified: Secondary | ICD-10-CM | POA: Diagnosis not present

## 2016-09-24 DIAGNOSIS — E1129 Type 2 diabetes mellitus with other diabetic kidney complication: Secondary | ICD-10-CM | POA: Diagnosis not present

## 2016-09-24 DIAGNOSIS — D689 Coagulation defect, unspecified: Secondary | ICD-10-CM | POA: Diagnosis not present

## 2016-09-24 DIAGNOSIS — N186 End stage renal disease: Secondary | ICD-10-CM | POA: Diagnosis not present

## 2016-09-24 DIAGNOSIS — L299 Pruritus, unspecified: Secondary | ICD-10-CM | POA: Diagnosis not present

## 2016-09-26 ENCOUNTER — Ambulatory Visit (HOSPITAL_COMMUNITY): Admission: RE | Admit: 2016-09-26 | Payer: Medicare HMO | Source: Ambulatory Visit

## 2016-09-27 ENCOUNTER — Ambulatory Visit (HOSPITAL_COMMUNITY)
Admission: RE | Admit: 2016-09-27 | Discharge: 2016-09-27 | Disposition: A | Discharge status: Home / Self Care | Payer: Medicare HMO | Source: Ambulatory Visit | Attending: Vascular Surgery | Admitting: Vascular Surgery

## 2016-09-27 ENCOUNTER — Encounter (HOSPITAL_COMMUNITY): Payer: Medicare HMO

## 2016-09-27 DIAGNOSIS — N2581 Secondary hyperparathyroidism of renal origin: Secondary | ICD-10-CM | POA: Diagnosis not present

## 2016-09-27 DIAGNOSIS — D631 Anemia in chronic kidney disease: Secondary | ICD-10-CM | POA: Diagnosis not present

## 2016-09-27 DIAGNOSIS — D509 Iron deficiency anemia, unspecified: Secondary | ICD-10-CM | POA: Diagnosis not present

## 2016-09-27 DIAGNOSIS — D689 Coagulation defect, unspecified: Secondary | ICD-10-CM | POA: Diagnosis not present

## 2016-09-27 DIAGNOSIS — N186 End stage renal disease: Secondary | ICD-10-CM | POA: Diagnosis not present

## 2016-09-27 DIAGNOSIS — R52 Pain, unspecified: Secondary | ICD-10-CM | POA: Diagnosis not present

## 2016-09-27 DIAGNOSIS — E1129 Type 2 diabetes mellitus with other diabetic kidney complication: Secondary | ICD-10-CM | POA: Diagnosis not present

## 2016-09-27 DIAGNOSIS — R0989 Other specified symptoms and signs involving the circulatory and respiratory systems: Secondary | ICD-10-CM

## 2016-09-27 LAB — VAS US CAROTID
LCCADSYS: -101 cm/s
LCCAPDIAS: 19 cm/s
LCCAPSYS: 94 cm/s
LEFT ECA DIAS: -16 cm/s
LEFT VERTEBRAL DIAS: 20 cm/s
LICADSYS: -75 cm/s
LICAPDIAS: -22 cm/s
Left CCA dist dias: -17 cm/s
Left ICA dist dias: -19 cm/s
Left ICA prox sys: -97 cm/s
RCCAPDIAS: 12 cm/s
RCCAPSYS: 89 cm/s
RIGHT CCA MID DIAS: 19 cm/s
RIGHT ECA DIAS: -10 cm/s
RIGHT VERTEBRAL DIAS: 14 cm/s
Right cca dist sys: -78 cm/s

## 2016-09-29 DIAGNOSIS — D689 Coagulation defect, unspecified: Secondary | ICD-10-CM | POA: Diagnosis not present

## 2016-09-29 DIAGNOSIS — E113313 Type 2 diabetes mellitus with moderate nonproliferative diabetic retinopathy with macular edema, bilateral: Secondary | ICD-10-CM | POA: Diagnosis not present

## 2016-09-29 DIAGNOSIS — D509 Iron deficiency anemia, unspecified: Secondary | ICD-10-CM | POA: Diagnosis not present

## 2016-09-29 DIAGNOSIS — D631 Anemia in chronic kidney disease: Secondary | ICD-10-CM | POA: Diagnosis not present

## 2016-09-29 DIAGNOSIS — N186 End stage renal disease: Secondary | ICD-10-CM | POA: Diagnosis not present

## 2016-09-29 DIAGNOSIS — E1129 Type 2 diabetes mellitus with other diabetic kidney complication: Secondary | ICD-10-CM | POA: Diagnosis not present

## 2016-09-29 DIAGNOSIS — N2581 Secondary hyperparathyroidism of renal origin: Secondary | ICD-10-CM | POA: Diagnosis not present

## 2016-09-29 DIAGNOSIS — R52 Pain, unspecified: Secondary | ICD-10-CM | POA: Diagnosis not present

## 2016-09-30 ENCOUNTER — Other Ambulatory Visit: Payer: Self-pay | Admitting: Vascular Surgery

## 2016-09-30 ENCOUNTER — Other Ambulatory Visit: Payer: Self-pay

## 2016-10-01 DIAGNOSIS — N186 End stage renal disease: Secondary | ICD-10-CM | POA: Diagnosis not present

## 2016-10-01 DIAGNOSIS — D631 Anemia in chronic kidney disease: Secondary | ICD-10-CM | POA: Diagnosis not present

## 2016-10-01 DIAGNOSIS — R52 Pain, unspecified: Secondary | ICD-10-CM | POA: Diagnosis not present

## 2016-10-01 DIAGNOSIS — E1129 Type 2 diabetes mellitus with other diabetic kidney complication: Secondary | ICD-10-CM | POA: Diagnosis not present

## 2016-10-01 DIAGNOSIS — N2581 Secondary hyperparathyroidism of renal origin: Secondary | ICD-10-CM | POA: Diagnosis not present

## 2016-10-01 DIAGNOSIS — D509 Iron deficiency anemia, unspecified: Secondary | ICD-10-CM | POA: Diagnosis not present

## 2016-10-01 DIAGNOSIS — D689 Coagulation defect, unspecified: Secondary | ICD-10-CM | POA: Diagnosis not present

## 2016-10-04 DIAGNOSIS — E1129 Type 2 diabetes mellitus with other diabetic kidney complication: Secondary | ICD-10-CM | POA: Diagnosis not present

## 2016-10-04 DIAGNOSIS — N2581 Secondary hyperparathyroidism of renal origin: Secondary | ICD-10-CM | POA: Diagnosis not present

## 2016-10-04 DIAGNOSIS — R52 Pain, unspecified: Secondary | ICD-10-CM | POA: Diagnosis not present

## 2016-10-04 DIAGNOSIS — D689 Coagulation defect, unspecified: Secondary | ICD-10-CM | POA: Diagnosis not present

## 2016-10-04 DIAGNOSIS — D631 Anemia in chronic kidney disease: Secondary | ICD-10-CM | POA: Diagnosis not present

## 2016-10-04 DIAGNOSIS — D509 Iron deficiency anemia, unspecified: Secondary | ICD-10-CM | POA: Diagnosis not present

## 2016-10-04 DIAGNOSIS — N186 End stage renal disease: Secondary | ICD-10-CM | POA: Diagnosis not present

## 2016-10-05 ENCOUNTER — Encounter (HOSPITAL_COMMUNITY): Payer: Self-pay | Admitting: *Deleted

## 2016-10-05 NOTE — Progress Notes (Signed)
Spoke with pt for pre-op call. Pt denies cardiac history or chest pain. Pt is a type 2 diabetic. States he thinks his most recent A1C was 6.0 3 weeks ago. States his fasting blood sugar is usually around 99. Pt takes his Lantus insulin at 11 AM. Instructed pt to check his blood sugar Friday AM when he gets up and every 2 hours until he leaves for the hospital. If blood sugar is 70 or below, treat with 1/2 cup of clear juice (apple or cranberry) and recheck blood sugar 15 minutes after drinking juice. If blood sugar continues to be 70 or below, call the Short Stay department and ask to speak to a nurse. Pt voiced understanding.

## 2016-10-06 DIAGNOSIS — N186 End stage renal disease: Secondary | ICD-10-CM | POA: Diagnosis not present

## 2016-10-06 DIAGNOSIS — D631 Anemia in chronic kidney disease: Secondary | ICD-10-CM | POA: Diagnosis not present

## 2016-10-06 DIAGNOSIS — E1129 Type 2 diabetes mellitus with other diabetic kidney complication: Secondary | ICD-10-CM | POA: Diagnosis not present

## 2016-10-06 DIAGNOSIS — D509 Iron deficiency anemia, unspecified: Secondary | ICD-10-CM | POA: Diagnosis not present

## 2016-10-06 DIAGNOSIS — N2581 Secondary hyperparathyroidism of renal origin: Secondary | ICD-10-CM | POA: Diagnosis not present

## 2016-10-06 DIAGNOSIS — D689 Coagulation defect, unspecified: Secondary | ICD-10-CM | POA: Diagnosis not present

## 2016-10-06 DIAGNOSIS — R52 Pain, unspecified: Secondary | ICD-10-CM | POA: Diagnosis not present

## 2016-10-07 ENCOUNTER — Encounter (HOSPITAL_COMMUNITY): Payer: Self-pay | Admitting: *Deleted

## 2016-10-07 ENCOUNTER — Telehealth: Payer: Self-pay | Admitting: Vascular Surgery

## 2016-10-07 ENCOUNTER — Ambulatory Visit (HOSPITAL_COMMUNITY): Payer: Medicare HMO | Admitting: Certified Registered"

## 2016-10-07 ENCOUNTER — Encounter (HOSPITAL_COMMUNITY)
Admission: RE | Disposition: A | Discharge status: Home / Self Care | Payer: Self-pay | Source: Ambulatory Visit | Attending: Vascular Surgery

## 2016-10-07 ENCOUNTER — Ambulatory Visit (HOSPITAL_COMMUNITY)
Admission: RE | Admit: 2016-10-07 | Discharge: 2016-10-07 | Disposition: A | Discharge status: Home / Self Care | Payer: Medicare HMO | Source: Ambulatory Visit | Attending: Vascular Surgery | Admitting: Vascular Surgery

## 2016-10-07 DIAGNOSIS — E1122 Type 2 diabetes mellitus with diabetic chronic kidney disease: Secondary | ICD-10-CM | POA: Diagnosis not present

## 2016-10-07 DIAGNOSIS — I132 Hypertensive heart and chronic kidney disease with heart failure and with stage 5 chronic kidney disease, or end stage renal disease: Secondary | ICD-10-CM | POA: Diagnosis not present

## 2016-10-07 DIAGNOSIS — N186 End stage renal disease: Secondary | ICD-10-CM | POA: Diagnosis not present

## 2016-10-07 DIAGNOSIS — F17210 Nicotine dependence, cigarettes, uncomplicated: Secondary | ICD-10-CM | POA: Insufficient documentation

## 2016-10-07 DIAGNOSIS — N185 Chronic kidney disease, stage 5: Secondary | ICD-10-CM | POA: Diagnosis not present

## 2016-10-07 DIAGNOSIS — Z992 Dependence on renal dialysis: Secondary | ICD-10-CM | POA: Diagnosis not present

## 2016-10-07 DIAGNOSIS — E039 Hypothyroidism, unspecified: Secondary | ICD-10-CM | POA: Diagnosis not present

## 2016-10-07 DIAGNOSIS — G2581 Restless legs syndrome: Secondary | ICD-10-CM | POA: Insufficient documentation

## 2016-10-07 DIAGNOSIS — Z823 Family history of stroke: Secondary | ICD-10-CM | POA: Insufficient documentation

## 2016-10-07 DIAGNOSIS — Z794 Long term (current) use of insulin: Secondary | ICD-10-CM | POA: Diagnosis not present

## 2016-10-07 DIAGNOSIS — Z79899 Other long term (current) drug therapy: Secondary | ICD-10-CM | POA: Insufficient documentation

## 2016-10-07 DIAGNOSIS — I120 Hypertensive chronic kidney disease with stage 5 chronic kidney disease or end stage renal disease: Secondary | ICD-10-CM | POA: Diagnosis not present

## 2016-10-07 DIAGNOSIS — Z8673 Personal history of transient ischemic attack (TIA), and cerebral infarction without residual deficits: Secondary | ICD-10-CM | POA: Insufficient documentation

## 2016-10-07 DIAGNOSIS — I5031 Acute diastolic (congestive) heart failure: Secondary | ICD-10-CM | POA: Diagnosis not present

## 2016-10-07 HISTORY — DX: Nausea with vomiting, unspecified: R11.2

## 2016-10-07 HISTORY — DX: Dyspnea, unspecified: R06.00

## 2016-10-07 HISTORY — DX: Headache: R51

## 2016-10-07 HISTORY — DX: Gastro-esophageal reflux disease without esophagitis: K21.9

## 2016-10-07 HISTORY — DX: Heart failure, unspecified: I50.9

## 2016-10-07 HISTORY — PX: AV FISTULA PLACEMENT: SHX1204

## 2016-10-07 HISTORY — DX: Headache, unspecified: R51.9

## 2016-10-07 HISTORY — DX: Anemia, unspecified: D64.9

## 2016-10-07 HISTORY — DX: Nausea with vomiting, unspecified: Z98.890

## 2016-10-07 LAB — GLUCOSE, CAPILLARY
Glucose-Capillary: 90 mg/dL (ref 65–99)
Glucose-Capillary: 103 mg/dL — ABNORMAL HIGH (ref 65–99)

## 2016-10-07 LAB — POCT I-STAT 4, (NA,K, GLUC, HGB,HCT)
Glucose, Bld: 92 mg/dL (ref 65–99)
HCT: 33 % — ABNORMAL LOW (ref 39.0–52.0)
Hemoglobin: 11.2 g/dL — ABNORMAL LOW (ref 13.0–17.0)
Potassium: 3.6 mmol/L (ref 3.5–5.1)
Sodium: 140 mmol/L (ref 135–145)

## 2016-10-07 SURGERY — ARTERIOVENOUS (AV) FISTULA CREATION
Anesthesia: Monitor Anesthesia Care | Site: Arm Upper | Laterality: Right

## 2016-10-07 MED ORDER — ONDANSETRON HCL 4 MG/2ML IJ SOLN: 4.0000 mg | Freq: Once | INTRAMUSCULAR | Status: DC | PRN

## 2016-10-07 MED ORDER — FENTANYL CITRATE (PF) 250 MCG/5ML IJ SOLN
INTRAMUSCULAR | Status: AC
  Filled 2016-10-07: qty 5

## 2016-10-07 MED ORDER — HEPARIN SODIUM (PORCINE) 1000 UNIT/ML IJ SOLN
INTRAMUSCULAR | Status: DC | PRN
  Administered 2016-10-07: 6000 [IU] via INTRAVENOUS

## 2016-10-07 MED ORDER — PHENYLEPHRINE 40 MCG/ML (10ML) SYRINGE FOR IV PUSH (FOR BLOOD PRESSURE SUPPORT)
PREFILLED_SYRINGE | INTRAVENOUS | Status: DC | PRN
  Administered 2016-10-07: 120 ug via INTRAVENOUS
  Administered 2016-10-07 (×2): 80 ug via INTRAVENOUS

## 2016-10-07 MED ORDER — 0.9 % SODIUM CHLORIDE (POUR BTL) OPTIME
TOPICAL | Status: DC | PRN
  Administered 2016-10-07: 1000 mL

## 2016-10-07 MED ORDER — SODIUM CHLORIDE 0.9 % IV SOLN
INTRAVENOUS | Status: DC | PRN
  Administered 2016-10-07: 10:00:00

## 2016-10-07 MED ORDER — MEPERIDINE HCL 25 MG/ML IJ SOLN: 6.2500 mg | INTRAMUSCULAR | Status: DC | PRN

## 2016-10-07 MED ORDER — PROPOFOL 10 MG/ML IV BOLUS
INTRAVENOUS | Status: AC
  Filled 2016-10-07: qty 20

## 2016-10-07 MED ORDER — CHLORHEXIDINE GLUCONATE CLOTH 2 % EX PADS: 6.0000 | MEDICATED_PAD | Freq: Once | CUTANEOUS | Status: DC

## 2016-10-07 MED ORDER — ONDANSETRON HCL 4 MG/2ML IJ SOLN
INTRAMUSCULAR | Status: DC | PRN
  Administered 2016-10-07: 4 mg via INTRAVENOUS

## 2016-10-07 MED ORDER — PROTAMINE SULFATE 10 MG/ML IV SOLN
INTRAVENOUS | Status: DC | PRN
  Administered 2016-10-07: 40 mg via INTRAVENOUS

## 2016-10-07 MED ORDER — INSULIN DETEMIR 100 UNIT/ML FLEXPEN: 10.0000 [IU] | PEN_INJECTOR | Freq: Every day | SUBCUTANEOUS | Status: AC

## 2016-10-07 MED ORDER — HYDROMORPHONE HCL 1 MG/ML IJ SOLN: 0.2500 mg | INTRAMUSCULAR | Status: DC | PRN

## 2016-10-07 MED ORDER — PHENYLEPHRINE HCL 10 MG/ML IJ SOLN
INTRAMUSCULAR | Status: DC | PRN
  Administered 2016-10-07: 40 ug/min via INTRAVENOUS

## 2016-10-07 MED ORDER — PROPOFOL 500 MG/50ML IV EMUL
INTRAVENOUS | Status: DC | PRN
  Administered 2016-10-07: 200 ug/kg/min via INTRAVENOUS

## 2016-10-07 MED ORDER — DEXTROSE 5 % IV SOLN
1.5000 g | INTRAVENOUS | Status: AC
  Administered 2016-10-07: 1.5 g via INTRAVENOUS
  Filled 2016-10-07: qty 1.5

## 2016-10-07 MED ORDER — SODIUM CHLORIDE 0.9 % IV SOLN
INTRAVENOUS | Status: DC
  Administered 2016-10-07: 35 mL/h via INTRAVENOUS

## 2016-10-07 MED ORDER — LIDOCAINE-EPINEPHRINE (PF) 1 %-1:200000 IJ SOLN
INTRAMUSCULAR | Status: AC
  Filled 2016-10-07: qty 30

## 2016-10-07 MED ORDER — FENTANYL CITRATE (PF) 100 MCG/2ML IJ SOLN
INTRAMUSCULAR | Status: DC | PRN
  Administered 2016-10-07 (×2): 25 ug via INTRAVENOUS

## 2016-10-07 MED ORDER — MIDAZOLAM HCL 5 MG/5ML IJ SOLN
INTRAMUSCULAR | Status: DC | PRN
  Administered 2016-10-07: 2 mg via INTRAVENOUS

## 2016-10-07 MED ORDER — OXYCODONE-ACETAMINOPHEN 5-325 MG PO TABS: 1.0000 | ORAL_TABLET | Freq: Four times a day (QID) | ORAL | 0 refills | Status: DC | PRN

## 2016-10-07 MED ORDER — EPHEDRINE SULFATE-NACL 50-0.9 MG/10ML-% IV SOSY
PREFILLED_SYRINGE | INTRAVENOUS | Status: DC | PRN
  Administered 2016-10-07 (×4): 10 mg via INTRAVENOUS

## 2016-10-07 MED ORDER — DEXAMETHASONE SODIUM PHOSPHATE 10 MG/ML IJ SOLN
INTRAMUSCULAR | Status: DC | PRN
  Administered 2016-10-07: 4 mg via INTRAVENOUS

## 2016-10-07 MED ORDER — LIDOCAINE HCL (PF) 1 % IJ SOLN
INTRAMUSCULAR | Status: AC
Start: 2016-10-07 — End: 2016-10-07
  Filled 2016-10-07: qty 30

## 2016-10-07 MED ORDER — EPHEDRINE 5 MG/ML INJ
INTRAVENOUS | Status: AC
  Filled 2016-10-07: qty 10

## 2016-10-07 MED ORDER — LIDOCAINE HCL (PF) 1 % IJ SOLN
INTRAMUSCULAR | Status: DC | PRN
  Administered 2016-10-07: 30 mL

## 2016-10-07 MED ORDER — MIDAZOLAM HCL 2 MG/2ML IJ SOLN
INTRAMUSCULAR | Status: AC
Start: 2016-10-07 — End: 2016-10-07
  Filled 2016-10-07: qty 2

## 2016-10-07 SURGICAL SUPPLY — 33 items
ARMBAND PINK RESTRICT EXTREMIT (MISCELLANEOUS) ×6 IMPLANT
CANISTER SUCT 3000ML PPV (MISCELLANEOUS) ×3 IMPLANT
CANNULA VESSEL 3MM 2 BLNT TIP (CANNULA) ×3 IMPLANT
CLIP VESOCCLUDE MED 6/CT (Clip) ×3 IMPLANT
CLIP VESOCCLUDE SM WIDE 6/CT (Clip) ×3 IMPLANT
COVER PROBE W GEL 5X96 (DRAPES) IMPLANT
DECANTER SPIKE VIAL GLASS SM (MISCELLANEOUS) ×3 IMPLANT
DERMABOND ADVANCED (GAUZE/BANDAGES/DRESSINGS) ×2
DERMABOND ADVANCED .7 DNX12 (GAUZE/BANDAGES/DRESSINGS) ×1 IMPLANT
ELECT REM PT RETURN 9FT ADLT (ELECTROSURGICAL) ×3
ELECTRODE REM PT RTRN 9FT ADLT (ELECTROSURGICAL) ×1 IMPLANT
GLOVE BIO SURGEON STRL SZ 6.5 (GLOVE) ×2 IMPLANT
GLOVE BIO SURGEON STRL SZ7.5 (GLOVE) ×3 IMPLANT
GLOVE BIO SURGEONS STRL SZ 6.5 (GLOVE) ×1
GLOVE BIOGEL PI IND STRL 7.0 (GLOVE) ×3 IMPLANT
GLOVE BIOGEL PI IND STRL 8 (GLOVE) ×2 IMPLANT
GLOVE BIOGEL PI INDICATOR 7.0 (GLOVE) ×6
GLOVE BIOGEL PI INDICATOR 8 (GLOVE) ×4
GLOVE SURG SS PI 6.5 STRL IVOR (GLOVE) ×3 IMPLANT
GOWN STRL REUS W/ TWL LRG LVL3 (GOWN DISPOSABLE) ×3 IMPLANT
GOWN STRL REUS W/TWL LRG LVL3 (GOWN DISPOSABLE) ×6
KIT BASIN OR (CUSTOM PROCEDURE TRAY) ×3 IMPLANT
KIT ROOM TURNOVER OR (KITS) ×3 IMPLANT
NS IRRIG 1000ML POUR BTL (IV SOLUTION) ×3 IMPLANT
PACK CV ACCESS (CUSTOM PROCEDURE TRAY) ×3 IMPLANT
PAD ARMBOARD 7.5X6 YLW CONV (MISCELLANEOUS) ×6 IMPLANT
SPONGE SURGIFOAM ABS GEL 100 (HEMOSTASIS) IMPLANT
SUT PROLENE 6 0 BV (SUTURE) ×3 IMPLANT
SUT VIC AB 3-0 SH 27 (SUTURE) ×2
SUT VIC AB 3-0 SH 27X BRD (SUTURE) ×1 IMPLANT
SUT VICRYL 4-0 PS2 18IN ABS (SUTURE) ×3 IMPLANT
UNDERPAD 30X30 (UNDERPADS AND DIAPERS) ×3 IMPLANT
WATER STERILE IRR 1000ML POUR (IV SOLUTION) ×3 IMPLANT

## 2016-10-07 NOTE — Transfer of Care (Cosign Needed)
Immediate Anesthesia Transfer of Care Note  Patient: Alexander Whitaker  Procedure(s) Performed: Procedure(s): RIGHT BRACHIOCEPHALIC ARTERIOVENOUS (AV) FISTULA CREATION (Right)  Patient Location: PACU  Anesthesia Type:MAC  Level of Consciousness: drowsy  Airway & Oxygen Therapy: Patient Spontanous Breathing  Post-op Assessment: Report given to RN and Post -op Vital signs reviewed and stable  Post vital signs: Reviewed and stable  Last Vitals:  Vitals:   10/07/16 0816  BP: (!) 168/51  Pulse: (!) 53  Resp: 16  Temp: 36.8 C  HR 62 BP 121/55 RR 21 Sats 94%  Last Pain:  Vitals:   10/07/16 0816  TempSrc: Oral         Complications: No apparent anesthesia complications

## 2016-10-07 NOTE — Telephone Encounter (Signed)
-----   Message from Mena Goes, RN sent at 10/07/2016 11:26 AM EDT ----- Regarding: 6 weeks w/ duplex   ----- Message ----- From: Alvia Grove, PA-C Sent: 10/07/2016  11:11 AM To: Vvs Charge Pool  S/p right brachial cephalic AV fistula 2/76/39  F/u with Dr. Scot Dock in 6 weeks with duplex  Thanks Maudie Mercury

## 2016-10-07 NOTE — Anesthesia Preprocedure Evaluation (Signed)
Anesthesia Evaluation  Patient identified by MRN, date of birth, ID band Patient awake    Reviewed: Allergy & Precautions, NPO status , Patient's Chart, lab work & pertinent test results  History of Anesthesia Complications (+) PONV  Airway Mallampati: II  TM Distance: >3 FB Neck ROM: Full    Dental   Pulmonary Current Smoker,    Pulmonary exam normal        Cardiovascular hypertension, Pt. on medications Normal cardiovascular exam     Neuro/Psych CVA    GI/Hepatic GERD  Medicated and Controlled,  Endo/Other  diabetes, Type 2, Oral Hypoglycemic Agents  Renal/GU      Musculoskeletal   Abdominal   Peds  Hematology   Anesthesia Other Findings   Reproductive/Obstetrics                             Anesthesia Physical Anesthesia Plan  ASA: III  Anesthesia Plan: MAC   Post-op Pain Management:    Induction: Intravenous  PONV Risk Score and Plan: 1 and Ondansetron and Dexamethasone  Airway Management Planned:   Additional Equipment:   Intra-op Plan:   Post-operative Plan:   Informed Consent: I have reviewed the patients History and Physical, chart, labs and discussed the procedure including the risks, benefits and alternatives for the proposed anesthesia with the patient or authorized representative who has indicated his/her understanding and acceptance.     Plan Discussed with: CRNA and Surgeon  Anesthesia Plan Comments:         Anesthesia Quick Evaluation

## 2016-10-07 NOTE — Op Note (Signed)
    NAME: Alexander Whitaker    MRN: [id number] DOB: 1950-06-12    DATE OF OPERATION: 10/07/2016  PREOP DIAGNOSIS:    End-stage renal disease  POSTOP DIAGNOSIS:    Same  PROCEDURE:    RIGHT BRACHIOCEPHALIC AV FISTULA   SURGEON: Judeth Cornfield. Scot Dock, MD, FACS  ASSIST: Silva Bandy, [facility]  ANESTHESIA: Local with sedation   EBL: Minimal  INDICATIONS:    Alexander Whitaker is a 66 y.o. male who dialyzes on Tuesdays Thursdays and Saturdays. He has a functioning catheter. He presents for placement of new access.  FINDINGS:    4 mm upper arm cephalic vein  TECHNIQUE:    The patient was taken to the operating room and sedated by anesthesia. The right upper extremity was prepped and draped in usual sterile fashion. After the skin was anesthetized with lumps and lidocaine, a transverse incision was made above the antecubital level and the cephalic vein was dissected free. This was ligated distally and irrigated up nicely with heparinized saline. It was a 4 mm vein. The brachial artery was dissected free beneath the fascia. The patient was heparinized. The artery was clamped proximally and distally and a longitudinal arteriotomy was made. The vein was sewn end-to-side to the artery using continuous 6-0 Prolene suture. At the completion was an excellent thrill in the fistula and a good radial and ulnar signal with the Doppler. The heparin was partially reversed with protamine. The wound was closed with deep [place] Vicryl and the skin closed with 4-0 Vicryl. Dermabond was applied. Patient tolerated the procedure well and was transferred to the recovery room in stable condition. All needle and sponge counts were correct.  [name], MD, FACS [facility]  DATE OF DICTATION:   10/07/2016

## 2016-10-07 NOTE — Anesthesia Postprocedure Evaluation (Signed)
Anesthesia Post Note  Patient: Alexander Whitaker  Procedure(s) Performed: Procedure(s) (LRB): RIGHT BRACHIOCEPHALIC ARTERIOVENOUS (AV) FISTULA CREATION (Right)     Patient location during evaluation: PACU Anesthesia Type: MAC Level of consciousness: awake and alert Pain management: pain level controlled Vital Signs Assessment: post-procedure vital signs reviewed and stable Respiratory status: spontaneous breathing, nonlabored ventilation, respiratory function stable and patient connected to nasal cannula oxygen Cardiovascular status: stable and blood pressure returned to baseline Anesthetic complications: no    Last Vitals:  Vitals:   10/07/16 1225 10/07/16 1226  BP: (!) 137/58   Pulse:  64  Resp:    Temp:      Last Pain:  Vitals:   10/07/16 0816  TempSrc: Oral                 Zion Ta DAVID

## 2016-10-07 NOTE — H&P (View-Only) (Signed)
Patient name: Alexander Whitaker MRN: [id number] DOB: November 23, 1950 Sex: male   REASON FOR CONSULT:    Evaluate for hemodialysis access. Referred by Dr. Erling Cruz.  HPI:   Alexander Whitaker is a pleasant 66 y.o. male, Who is referred for evaluation of hemodialysis access. I have reviewed the records that were sent from [facility]. The patient has stage V chronic kidney disease. This is thought to be secondary to diabetic nephropathy. His GFR is less than 15.  He had a tunneled dialysis catheter placed at [facility] about 6 weeks ago and dialyzes on Tuesdays Thursdays and Saturdays. He denies any recent uremic symptoms. Specifically, he denies nausea, vomiting, fatigue, anorexia, or palpitations.  He is right-handed. He does not have a pacemaker.  Past Medical History:  Diagnosis Date  . CKD stage 3 due to type 2 diabetes mellitus ([place])   . CVA (cerebral vascular accident) ([place]) 2016  . Diabetes mellitus ([place])   . Hypertension   . Hypothyroidism   . Restless legs     Family History  Problem Relation Age of Onset  . Leukemia Mother 53  . CVA Father 67    SOCIAL HISTORY: Social History   Social History  . Marital status: Married    Spouse name: N/A  . Number of children: N/A  . Years of education: N/A   Occupational History  . retired    Social History Main Topics  . Smoking status: Current Every Day Smoker    Packs/day: 1.00    Years: 57.00  . Smokeless tobacco: Never Used     Comment: 1/2 pk a day or less.   . Alcohol use Yes     Comment: rare beer  . Drug use: No  . Sexual activity: Not on file   Other Topics Concern  . Not on file   Social History Narrative  . No narrative on file    No Known Allergies  Current Outpatient Prescriptions  Medication Sig Dispense Refill  . albuterol (PROVENTIL HFA;VENTOLIN HFA) 108 (90 Base) MCG/ACT inhaler Inhale 2 puffs into the lungs every 6 (six) hours as needed for wheezing or shortness of breath.      [name] amLODipine (NORVASC) 5 MG tablet Take 1 tablet (5 mg total) by mouth daily. 30 tablet 0  . hydrALAZINE (APRESOLINE) 10 MG tablet Take 10 mg by mouth 2 (two) times daily.    . Insulin Detemir (LEVEMIR) 100 UNIT/ML Pen Inject 10 Units into the skin daily at 10 pm. 15 mL 11  . levothyroxine (SYNTHROID, LEVOTHROID) 25 MCG tablet Take 25 mcg by mouth daily before breakfast.    . loratadine (CLARITIN) 10 MG tablet Take 10 mg by mouth daily.    . metoprolol tartrate (LOPRESSOR) 25 MG tablet Take 25 mg by mouth 2 (two) times daily.    . promethazine (PHENERGAN) 12.5 MG tablet Take 1 tablet (12.5 mg total) by mouth every 8 (eight) hours as needed for nausea, vomiting or refractory nausea / vomiting. 10 tablet 0  . tamsulosin (FLOMAX) 0.4 MG CAPS capsule Take 1 capsule (0.4 mg total) by mouth daily after supper. 30 capsule 0   No current facility-administered medications for this visit.     REVIEW OF SYSTEMS:  $RemoveB'[X]'YSLRGTRC$  denotes positive finding, $RemoveBeforeDEI'[ ]'PPEKZdoqNJkkeDBc$  denotes negative finding Cardiac  Comments:  Chest pain or chest pressure:    Shortness of breath upon exertion:    Short of breath when lying flat:    Irregular heart rhythm:  Vascular    Pain in calf, thigh, or hip brought on by ambulation:    Pain in feet at night that wakes you up from your sleep:     Blood clot in your veins:    Leg swelling:         Pulmonary    Oxygen at home:    Productive cough:     Wheezing:         Neurologic    Sudden weakness in arms or legs:     Sudden numbness in arms or legs:     Sudden onset of difficulty speaking or slurred speech:    Temporary loss of vision in one eye:     Problems with dizziness:         Gastrointestinal    Blood in stool:     Vomited blood:         Genitourinary    Burning when urinating:     Blood in urine:        Psychiatric    Major depression:         Hematologic    Bleeding problems:    Problems with blood clotting too easily:        Skin    Rashes or ulcers:         Constitutional    Fever or chills:     PHYSICAL EXAM:   Vitals:   09/21/16 1410 09/21/16 1412  BP: (!) 190/77 (!) 184/79  Pulse: (!) 57   Resp: 16   Temp: 97.2 F (36.2 C)   TempSrc: Oral   SpO2: 98%   Weight: 149 lb (67.6 kg)   Height: $Remove'5\' 5"'CbSqCbO$  (1.651 m)     GENERAL: The patient is a well-nourished male, in no acute distress. The vital signs are documented above. CARDIAC: There is a regular rate and rhythm.  VASCULAR: He has a left carotid bruit. He has slightly diminished radial pulses bilaterally. I lifted both upper arm cephalic veins with the SonoSite myself and it appears that the right upper arm cephalic vein does appear to be better than the left. His Doppler signals in both upper extremities by my exam are equal. He has monophasic radial and ulnar signals. PULMONARY: There is good air exchange bilaterally without wheezing or rales. ABDOMEN: Soft and non-tender with normal pitched bowel sounds.  MUSCULOSKELETAL: There are no major deformities or cyanosis. NEUROLOGIC: No focal weakness or paresthesias are detected. SKIN: There are no ulcers or rashes noted. PSYCHIATRIC: The patient has a normal affect.  DATA:    BILATERAL UPPER EXTREMITY VEIN MAP: I have independently interpreted his upper extremity vein mapping.  On the left side, the forearm and upper arm cephalic vein looks marginal in size. The basilic vein is short.  On the right side the forearm cephalic vein looks marginal in size. The upper arm cephalic vein looks more reasonable in size. The basilic vein is short.  UPPER EXTREMITY ARTERIAL DUPLEX: I have independent interpreted the upper extremity arterial duplex.  On the left side there is a triphasic radial and ulnar waveform. The brachial artery is 0.53 cm in diameter.  On the right side there is a triphasic radial signal. The ulnar artery is occluded. The brachial artery is 0.4 cm in diameter.   MEDICAL ISSUES:   END-STAGE RENAL DISEASE: Based on  my exam and his noninvasive studies, I believe his best option for a fistula would be a right brachiocephalic AV fistula. I do not see any  good options for fistula in the left arm. I've explained that if the right upper arm cephalic vein is not adequate we could potentially do a first stage basilic vein transposition. If none of his veins are adequately would require placement of an AV graft. I have explained the indications for placement of an AV fistula or AV graft. I've explained that if at all possible we will place an AV fistula.  I have reviewed the risks of placement of an AV fistula including but not limited to: failure of the fistula to mature, need for subsequent interventions, and thrombosis. In addition I have reviewed the potential complications of placement of an AV graft. These risks include, but are not limited to, graft thrombosis, graft infection, wound healing problems, bleeding, arm swelling, and steal syndrome. All the patient's questions were answered and they are agreeable to proceed with surgery. This surgery is scheduled for 10/07/2016.  LEFT CAROTID BRUIT: I did detect a left carotid bruit. He tells me that he did have a left brain stroke 2 years ago. I cannot find any previous carotid duplex scans. We were unable to schedule the duplex in the office today and I will have him scheduled for a carotid duplex hopefully before his surgery.    [name] [facility] [place] [phone]

## 2016-10-07 NOTE — Interval H&P Note (Signed)
History and Physical Interval Note:  10/07/2016 9:32 AM  Alexander Whitaker  has presented today for surgery, with the diagnosis of End Stage Renal Disease N18.6  The various methods of treatment have been discussed with the patient and family. After consideration of risks, benefits and other options for treatment, the patient has consented to  Procedure(s): RIGHT BRACHIOCEPHALIC ARTERIOVENOUS (AV) FISTULA CREATION (Right) as a surgical intervention .  The patient's history has been reviewed, patient examined, no change in status, stable for surgery.  I have reviewed the patient's chart and labs.  Questions were answered to the patient's satisfaction.     [name]

## 2016-10-07 NOTE — Telephone Encounter (Signed)
Sched lab 11/15/16 at 4:00 and MD 11/16/16 at 2:45. Spoke to pt's wife.

## 2016-10-08 ENCOUNTER — Encounter (HOSPITAL_COMMUNITY): Payer: Self-pay | Admitting: Vascular Surgery

## 2016-10-08 DIAGNOSIS — D689 Coagulation defect, unspecified: Secondary | ICD-10-CM | POA: Diagnosis not present

## 2016-10-08 DIAGNOSIS — D631 Anemia in chronic kidney disease: Secondary | ICD-10-CM | POA: Diagnosis not present

## 2016-10-08 DIAGNOSIS — D509 Iron deficiency anemia, unspecified: Secondary | ICD-10-CM | POA: Diagnosis not present

## 2016-10-08 DIAGNOSIS — N2581 Secondary hyperparathyroidism of renal origin: Secondary | ICD-10-CM | POA: Diagnosis not present

## 2016-10-08 DIAGNOSIS — E1129 Type 2 diabetes mellitus with other diabetic kidney complication: Secondary | ICD-10-CM | POA: Diagnosis not present

## 2016-10-08 DIAGNOSIS — N186 End stage renal disease: Secondary | ICD-10-CM | POA: Diagnosis not present

## 2016-10-08 DIAGNOSIS — R52 Pain, unspecified: Secondary | ICD-10-CM | POA: Diagnosis not present

## 2016-10-11 DIAGNOSIS — D509 Iron deficiency anemia, unspecified: Secondary | ICD-10-CM | POA: Diagnosis not present

## 2016-10-11 DIAGNOSIS — E1129 Type 2 diabetes mellitus with other diabetic kidney complication: Secondary | ICD-10-CM | POA: Diagnosis not present

## 2016-10-11 DIAGNOSIS — D689 Coagulation defect, unspecified: Secondary | ICD-10-CM | POA: Diagnosis not present

## 2016-10-11 DIAGNOSIS — N186 End stage renal disease: Secondary | ICD-10-CM | POA: Diagnosis not present

## 2016-10-11 DIAGNOSIS — D631 Anemia in chronic kidney disease: Secondary | ICD-10-CM | POA: Diagnosis not present

## 2016-10-11 DIAGNOSIS — R52 Pain, unspecified: Secondary | ICD-10-CM | POA: Diagnosis not present

## 2016-10-11 DIAGNOSIS — N2581 Secondary hyperparathyroidism of renal origin: Secondary | ICD-10-CM | POA: Diagnosis not present

## 2016-10-13 DIAGNOSIS — D689 Coagulation defect, unspecified: Secondary | ICD-10-CM | POA: Diagnosis not present

## 2016-10-13 DIAGNOSIS — D631 Anemia in chronic kidney disease: Secondary | ICD-10-CM | POA: Diagnosis not present

## 2016-10-13 DIAGNOSIS — R52 Pain, unspecified: Secondary | ICD-10-CM | POA: Diagnosis not present

## 2016-10-13 DIAGNOSIS — N2581 Secondary hyperparathyroidism of renal origin: Secondary | ICD-10-CM | POA: Diagnosis not present

## 2016-10-13 DIAGNOSIS — D509 Iron deficiency anemia, unspecified: Secondary | ICD-10-CM | POA: Diagnosis not present

## 2016-10-13 DIAGNOSIS — E1129 Type 2 diabetes mellitus with other diabetic kidney complication: Secondary | ICD-10-CM | POA: Diagnosis not present

## 2016-10-13 DIAGNOSIS — N186 End stage renal disease: Secondary | ICD-10-CM | POA: Diagnosis not present

## 2016-10-15 DIAGNOSIS — N186 End stage renal disease: Secondary | ICD-10-CM | POA: Diagnosis not present

## 2016-10-15 DIAGNOSIS — D631 Anemia in chronic kidney disease: Secondary | ICD-10-CM | POA: Diagnosis not present

## 2016-10-15 DIAGNOSIS — D509 Iron deficiency anemia, unspecified: Secondary | ICD-10-CM | POA: Diagnosis not present

## 2016-10-15 DIAGNOSIS — E1129 Type 2 diabetes mellitus with other diabetic kidney complication: Secondary | ICD-10-CM | POA: Diagnosis not present

## 2016-10-15 DIAGNOSIS — D689 Coagulation defect, unspecified: Secondary | ICD-10-CM | POA: Diagnosis not present

## 2016-10-15 DIAGNOSIS — R52 Pain, unspecified: Secondary | ICD-10-CM | POA: Diagnosis not present

## 2016-10-15 DIAGNOSIS — N2581 Secondary hyperparathyroidism of renal origin: Secondary | ICD-10-CM | POA: Diagnosis not present

## 2016-10-18 DIAGNOSIS — D509 Iron deficiency anemia, unspecified: Secondary | ICD-10-CM | POA: Diagnosis not present

## 2016-10-18 DIAGNOSIS — E1129 Type 2 diabetes mellitus with other diabetic kidney complication: Secondary | ICD-10-CM | POA: Diagnosis not present

## 2016-10-18 DIAGNOSIS — D631 Anemia in chronic kidney disease: Secondary | ICD-10-CM | POA: Diagnosis not present

## 2016-10-18 DIAGNOSIS — N2581 Secondary hyperparathyroidism of renal origin: Secondary | ICD-10-CM | POA: Diagnosis not present

## 2016-10-18 DIAGNOSIS — R52 Pain, unspecified: Secondary | ICD-10-CM | POA: Diagnosis not present

## 2016-10-18 DIAGNOSIS — N186 End stage renal disease: Secondary | ICD-10-CM | POA: Diagnosis not present

## 2016-10-18 DIAGNOSIS — D689 Coagulation defect, unspecified: Secondary | ICD-10-CM | POA: Diagnosis not present

## 2016-10-20 DIAGNOSIS — N186 End stage renal disease: Secondary | ICD-10-CM | POA: Diagnosis not present

## 2016-10-20 DIAGNOSIS — R52 Pain, unspecified: Secondary | ICD-10-CM | POA: Diagnosis not present

## 2016-10-20 DIAGNOSIS — N2581 Secondary hyperparathyroidism of renal origin: Secondary | ICD-10-CM | POA: Diagnosis not present

## 2016-10-20 DIAGNOSIS — D509 Iron deficiency anemia, unspecified: Secondary | ICD-10-CM | POA: Diagnosis not present

## 2016-10-20 DIAGNOSIS — D689 Coagulation defect, unspecified: Secondary | ICD-10-CM | POA: Diagnosis not present

## 2016-10-20 DIAGNOSIS — D631 Anemia in chronic kidney disease: Secondary | ICD-10-CM | POA: Diagnosis not present

## 2016-10-20 DIAGNOSIS — E1129 Type 2 diabetes mellitus with other diabetic kidney complication: Secondary | ICD-10-CM | POA: Diagnosis not present

## 2016-10-22 DIAGNOSIS — D689 Coagulation defect, unspecified: Secondary | ICD-10-CM | POA: Diagnosis not present

## 2016-10-22 DIAGNOSIS — E1129 Type 2 diabetes mellitus with other diabetic kidney complication: Secondary | ICD-10-CM | POA: Diagnosis not present

## 2016-10-22 DIAGNOSIS — N186 End stage renal disease: Secondary | ICD-10-CM | POA: Diagnosis not present

## 2016-10-22 DIAGNOSIS — D509 Iron deficiency anemia, unspecified: Secondary | ICD-10-CM | POA: Diagnosis not present

## 2016-10-22 DIAGNOSIS — R52 Pain, unspecified: Secondary | ICD-10-CM | POA: Diagnosis not present

## 2016-10-22 DIAGNOSIS — N2581 Secondary hyperparathyroidism of renal origin: Secondary | ICD-10-CM | POA: Diagnosis not present

## 2016-10-22 DIAGNOSIS — D631 Anemia in chronic kidney disease: Secondary | ICD-10-CM | POA: Diagnosis not present

## 2016-10-25 DIAGNOSIS — E1129 Type 2 diabetes mellitus with other diabetic kidney complication: Secondary | ICD-10-CM | POA: Diagnosis not present

## 2016-10-25 DIAGNOSIS — Z992 Dependence on renal dialysis: Secondary | ICD-10-CM | POA: Diagnosis not present

## 2016-10-25 DIAGNOSIS — N2581 Secondary hyperparathyroidism of renal origin: Secondary | ICD-10-CM | POA: Diagnosis not present

## 2016-10-25 DIAGNOSIS — N186 End stage renal disease: Secondary | ICD-10-CM | POA: Diagnosis not present

## 2016-10-25 DIAGNOSIS — D509 Iron deficiency anemia, unspecified: Secondary | ICD-10-CM | POA: Diagnosis not present

## 2016-10-25 DIAGNOSIS — D689 Coagulation defect, unspecified: Secondary | ICD-10-CM | POA: Diagnosis not present

## 2016-10-25 DIAGNOSIS — R52 Pain, unspecified: Secondary | ICD-10-CM | POA: Diagnosis not present

## 2016-10-25 DIAGNOSIS — D631 Anemia in chronic kidney disease: Secondary | ICD-10-CM | POA: Diagnosis not present

## 2016-10-27 DIAGNOSIS — N186 End stage renal disease: Secondary | ICD-10-CM | POA: Diagnosis not present

## 2016-10-27 DIAGNOSIS — R197 Diarrhea, unspecified: Secondary | ICD-10-CM | POA: Diagnosis not present

## 2016-10-27 DIAGNOSIS — N2581 Secondary hyperparathyroidism of renal origin: Secondary | ICD-10-CM | POA: Diagnosis not present

## 2016-10-27 DIAGNOSIS — R52 Pain, unspecified: Secondary | ICD-10-CM | POA: Diagnosis not present

## 2016-10-27 DIAGNOSIS — D509 Iron deficiency anemia, unspecified: Secondary | ICD-10-CM | POA: Diagnosis not present

## 2016-10-27 DIAGNOSIS — D689 Coagulation defect, unspecified: Secondary | ICD-10-CM | POA: Diagnosis not present

## 2016-10-27 DIAGNOSIS — Z23 Encounter for immunization: Secondary | ICD-10-CM | POA: Diagnosis not present

## 2016-10-27 DIAGNOSIS — E1129 Type 2 diabetes mellitus with other diabetic kidney complication: Secondary | ICD-10-CM | POA: Diagnosis not present

## 2016-10-27 DIAGNOSIS — D631 Anemia in chronic kidney disease: Secondary | ICD-10-CM | POA: Diagnosis not present

## 2016-10-29 DIAGNOSIS — E1129 Type 2 diabetes mellitus with other diabetic kidney complication: Secondary | ICD-10-CM | POA: Diagnosis not present

## 2016-10-29 DIAGNOSIS — R197 Diarrhea, unspecified: Secondary | ICD-10-CM | POA: Diagnosis not present

## 2016-10-29 DIAGNOSIS — D509 Iron deficiency anemia, unspecified: Secondary | ICD-10-CM | POA: Diagnosis not present

## 2016-10-29 DIAGNOSIS — Z23 Encounter for immunization: Secondary | ICD-10-CM | POA: Diagnosis not present

## 2016-10-29 DIAGNOSIS — D631 Anemia in chronic kidney disease: Secondary | ICD-10-CM | POA: Diagnosis not present

## 2016-10-29 DIAGNOSIS — N2581 Secondary hyperparathyroidism of renal origin: Secondary | ICD-10-CM | POA: Diagnosis not present

## 2016-10-29 DIAGNOSIS — N186 End stage renal disease: Secondary | ICD-10-CM | POA: Diagnosis not present

## 2016-10-29 DIAGNOSIS — R52 Pain, unspecified: Secondary | ICD-10-CM | POA: Diagnosis not present

## 2016-10-29 DIAGNOSIS — D689 Coagulation defect, unspecified: Secondary | ICD-10-CM | POA: Diagnosis not present

## 2016-11-01 DIAGNOSIS — N2581 Secondary hyperparathyroidism of renal origin: Secondary | ICD-10-CM | POA: Diagnosis not present

## 2016-11-01 DIAGNOSIS — D509 Iron deficiency anemia, unspecified: Secondary | ICD-10-CM | POA: Diagnosis not present

## 2016-11-01 DIAGNOSIS — D631 Anemia in chronic kidney disease: Secondary | ICD-10-CM | POA: Diagnosis not present

## 2016-11-01 DIAGNOSIS — R52 Pain, unspecified: Secondary | ICD-10-CM | POA: Diagnosis not present

## 2016-11-01 DIAGNOSIS — R197 Diarrhea, unspecified: Secondary | ICD-10-CM | POA: Diagnosis not present

## 2016-11-01 DIAGNOSIS — D689 Coagulation defect, unspecified: Secondary | ICD-10-CM | POA: Diagnosis not present

## 2016-11-01 DIAGNOSIS — N186 End stage renal disease: Secondary | ICD-10-CM | POA: Diagnosis not present

## 2016-11-01 DIAGNOSIS — Z23 Encounter for immunization: Secondary | ICD-10-CM | POA: Diagnosis not present

## 2016-11-01 DIAGNOSIS — E1129 Type 2 diabetes mellitus with other diabetic kidney complication: Secondary | ICD-10-CM | POA: Diagnosis not present

## 2016-11-03 DIAGNOSIS — R52 Pain, unspecified: Secondary | ICD-10-CM | POA: Diagnosis not present

## 2016-11-03 DIAGNOSIS — E1129 Type 2 diabetes mellitus with other diabetic kidney complication: Secondary | ICD-10-CM | POA: Diagnosis not present

## 2016-11-03 DIAGNOSIS — R197 Diarrhea, unspecified: Secondary | ICD-10-CM | POA: Diagnosis not present

## 2016-11-03 DIAGNOSIS — Z23 Encounter for immunization: Secondary | ICD-10-CM | POA: Diagnosis not present

## 2016-11-03 DIAGNOSIS — D689 Coagulation defect, unspecified: Secondary | ICD-10-CM | POA: Diagnosis not present

## 2016-11-03 DIAGNOSIS — N186 End stage renal disease: Secondary | ICD-10-CM | POA: Diagnosis not present

## 2016-11-03 DIAGNOSIS — D631 Anemia in chronic kidney disease: Secondary | ICD-10-CM | POA: Diagnosis not present

## 2016-11-03 DIAGNOSIS — N2581 Secondary hyperparathyroidism of renal origin: Secondary | ICD-10-CM | POA: Diagnosis not present

## 2016-11-03 DIAGNOSIS — D509 Iron deficiency anemia, unspecified: Secondary | ICD-10-CM | POA: Diagnosis not present

## 2016-11-04 ENCOUNTER — Other Ambulatory Visit: Payer: Self-pay

## 2016-11-04 DIAGNOSIS — Z4931 Encounter for adequacy testing for hemodialysis: Secondary | ICD-10-CM

## 2016-11-05 DIAGNOSIS — E1129 Type 2 diabetes mellitus with other diabetic kidney complication: Secondary | ICD-10-CM | POA: Diagnosis not present

## 2016-11-05 DIAGNOSIS — D689 Coagulation defect, unspecified: Secondary | ICD-10-CM | POA: Diagnosis not present

## 2016-11-05 DIAGNOSIS — D631 Anemia in chronic kidney disease: Secondary | ICD-10-CM | POA: Diagnosis not present

## 2016-11-05 DIAGNOSIS — R52 Pain, unspecified: Secondary | ICD-10-CM | POA: Diagnosis not present

## 2016-11-05 DIAGNOSIS — N186 End stage renal disease: Secondary | ICD-10-CM | POA: Diagnosis not present

## 2016-11-05 DIAGNOSIS — D509 Iron deficiency anemia, unspecified: Secondary | ICD-10-CM | POA: Diagnosis not present

## 2016-11-05 DIAGNOSIS — R197 Diarrhea, unspecified: Secondary | ICD-10-CM | POA: Diagnosis not present

## 2016-11-05 DIAGNOSIS — Z23 Encounter for immunization: Secondary | ICD-10-CM | POA: Diagnosis not present

## 2016-11-05 DIAGNOSIS — N2581 Secondary hyperparathyroidism of renal origin: Secondary | ICD-10-CM | POA: Diagnosis not present

## 2016-11-08 ENCOUNTER — Encounter: Payer: Self-pay | Admitting: Vascular Surgery

## 2016-11-08 DIAGNOSIS — D689 Coagulation defect, unspecified: Secondary | ICD-10-CM | POA: Diagnosis not present

## 2016-11-08 DIAGNOSIS — N186 End stage renal disease: Secondary | ICD-10-CM | POA: Diagnosis not present

## 2016-11-08 DIAGNOSIS — D509 Iron deficiency anemia, unspecified: Secondary | ICD-10-CM | POA: Diagnosis not present

## 2016-11-08 DIAGNOSIS — R197 Diarrhea, unspecified: Secondary | ICD-10-CM | POA: Diagnosis not present

## 2016-11-08 DIAGNOSIS — N2581 Secondary hyperparathyroidism of renal origin: Secondary | ICD-10-CM | POA: Diagnosis not present

## 2016-11-08 DIAGNOSIS — Z23 Encounter for immunization: Secondary | ICD-10-CM | POA: Diagnosis not present

## 2016-11-08 DIAGNOSIS — R52 Pain, unspecified: Secondary | ICD-10-CM | POA: Diagnosis not present

## 2016-11-08 DIAGNOSIS — D631 Anemia in chronic kidney disease: Secondary | ICD-10-CM | POA: Diagnosis not present

## 2016-11-08 DIAGNOSIS — E1129 Type 2 diabetes mellitus with other diabetic kidney complication: Secondary | ICD-10-CM | POA: Diagnosis not present

## 2016-11-10 DIAGNOSIS — E113313 Type 2 diabetes mellitus with moderate nonproliferative diabetic retinopathy with macular edema, bilateral: Secondary | ICD-10-CM | POA: Diagnosis not present

## 2016-11-10 DIAGNOSIS — D631 Anemia in chronic kidney disease: Secondary | ICD-10-CM | POA: Diagnosis not present

## 2016-11-10 DIAGNOSIS — E1129 Type 2 diabetes mellitus with other diabetic kidney complication: Secondary | ICD-10-CM | POA: Diagnosis not present

## 2016-11-10 DIAGNOSIS — N2581 Secondary hyperparathyroidism of renal origin: Secondary | ICD-10-CM | POA: Diagnosis not present

## 2016-11-10 DIAGNOSIS — R197 Diarrhea, unspecified: Secondary | ICD-10-CM | POA: Diagnosis not present

## 2016-11-10 DIAGNOSIS — D689 Coagulation defect, unspecified: Secondary | ICD-10-CM | POA: Diagnosis not present

## 2016-11-10 DIAGNOSIS — D509 Iron deficiency anemia, unspecified: Secondary | ICD-10-CM | POA: Diagnosis not present

## 2016-11-10 DIAGNOSIS — N186 End stage renal disease: Secondary | ICD-10-CM | POA: Diagnosis not present

## 2016-11-10 DIAGNOSIS — R52 Pain, unspecified: Secondary | ICD-10-CM | POA: Diagnosis not present

## 2016-11-10 DIAGNOSIS — Z23 Encounter for immunization: Secondary | ICD-10-CM | POA: Diagnosis not present

## 2016-11-11 DIAGNOSIS — E1122 Type 2 diabetes mellitus with diabetic chronic kidney disease: Secondary | ICD-10-CM | POA: Diagnosis not present

## 2016-11-11 DIAGNOSIS — R0600 Dyspnea, unspecified: Secondary | ICD-10-CM | POA: Diagnosis not present

## 2016-11-11 DIAGNOSIS — I120 Hypertensive chronic kidney disease with stage 5 chronic kidney disease or end stage renal disease: Secondary | ICD-10-CM | POA: Diagnosis not present

## 2016-11-11 DIAGNOSIS — Z01818 Encounter for other preprocedural examination: Secondary | ICD-10-CM | POA: Diagnosis not present

## 2016-11-11 DIAGNOSIS — I493 Ventricular premature depolarization: Secondary | ICD-10-CM | POA: Diagnosis not present

## 2016-11-11 DIAGNOSIS — N186 End stage renal disease: Secondary | ICD-10-CM | POA: Diagnosis not present

## 2016-11-11 DIAGNOSIS — R001 Bradycardia, unspecified: Secondary | ICD-10-CM | POA: Diagnosis not present

## 2016-11-11 DIAGNOSIS — I081 Rheumatic disorders of both mitral and tricuspid valves: Secondary | ICD-10-CM | POA: Diagnosis not present

## 2016-11-11 DIAGNOSIS — J449 Chronic obstructive pulmonary disease, unspecified: Secondary | ICD-10-CM | POA: Diagnosis not present

## 2016-11-11 DIAGNOSIS — Z992 Dependence on renal dialysis: Secondary | ICD-10-CM | POA: Diagnosis not present

## 2016-11-11 DIAGNOSIS — I272 Pulmonary hypertension, unspecified: Secondary | ICD-10-CM | POA: Diagnosis not present

## 2016-11-11 DIAGNOSIS — I517 Cardiomegaly: Secondary | ICD-10-CM | POA: Diagnosis not present

## 2016-11-11 DIAGNOSIS — R9431 Abnormal electrocardiogram [ECG] [EKG]: Secondary | ICD-10-CM | POA: Diagnosis not present

## 2016-11-12 DIAGNOSIS — N2581 Secondary hyperparathyroidism of renal origin: Secondary | ICD-10-CM | POA: Diagnosis not present

## 2016-11-12 DIAGNOSIS — E1129 Type 2 diabetes mellitus with other diabetic kidney complication: Secondary | ICD-10-CM | POA: Diagnosis not present

## 2016-11-12 DIAGNOSIS — R52 Pain, unspecified: Secondary | ICD-10-CM | POA: Diagnosis not present

## 2016-11-12 DIAGNOSIS — N186 End stage renal disease: Secondary | ICD-10-CM | POA: Diagnosis not present

## 2016-11-12 DIAGNOSIS — D509 Iron deficiency anemia, unspecified: Secondary | ICD-10-CM | POA: Diagnosis not present

## 2016-11-12 DIAGNOSIS — Z23 Encounter for immunization: Secondary | ICD-10-CM | POA: Diagnosis not present

## 2016-11-12 DIAGNOSIS — D631 Anemia in chronic kidney disease: Secondary | ICD-10-CM | POA: Diagnosis not present

## 2016-11-12 DIAGNOSIS — R197 Diarrhea, unspecified: Secondary | ICD-10-CM | POA: Diagnosis not present

## 2016-11-12 DIAGNOSIS — D689 Coagulation defect, unspecified: Secondary | ICD-10-CM | POA: Diagnosis not present

## 2016-11-15 ENCOUNTER — Ambulatory Visit (HOSPITAL_COMMUNITY)
Admit: 2016-11-15 | Discharge: 2016-11-15 | Disposition: A | Discharge status: Home / Self Care | Payer: Medicare HMO | Attending: Vascular Surgery | Admitting: Vascular Surgery

## 2016-11-15 DIAGNOSIS — D689 Coagulation defect, unspecified: Secondary | ICD-10-CM | POA: Diagnosis not present

## 2016-11-15 DIAGNOSIS — N2581 Secondary hyperparathyroidism of renal origin: Secondary | ICD-10-CM | POA: Diagnosis not present

## 2016-11-15 DIAGNOSIS — R52 Pain, unspecified: Secondary | ICD-10-CM | POA: Diagnosis not present

## 2016-11-15 DIAGNOSIS — E1129 Type 2 diabetes mellitus with other diabetic kidney complication: Secondary | ICD-10-CM | POA: Diagnosis not present

## 2016-11-15 DIAGNOSIS — Z23 Encounter for immunization: Secondary | ICD-10-CM | POA: Diagnosis not present

## 2016-11-15 DIAGNOSIS — Z4931 Encounter for adequacy testing for hemodialysis: Secondary | ICD-10-CM | POA: Diagnosis not present

## 2016-11-15 DIAGNOSIS — D509 Iron deficiency anemia, unspecified: Secondary | ICD-10-CM | POA: Diagnosis not present

## 2016-11-15 DIAGNOSIS — D631 Anemia in chronic kidney disease: Secondary | ICD-10-CM | POA: Diagnosis not present

## 2016-11-15 DIAGNOSIS — N186 End stage renal disease: Secondary | ICD-10-CM | POA: Diagnosis not present

## 2016-11-15 DIAGNOSIS — R197 Diarrhea, unspecified: Secondary | ICD-10-CM | POA: Diagnosis not present

## 2016-11-16 ENCOUNTER — Ambulatory Visit (INDEPENDENT_AMBULATORY_CARE_PROVIDER_SITE_OTHER): Payer: Self-pay | Admitting: Vascular Surgery

## 2016-11-16 ENCOUNTER — Encounter: Payer: Medicare HMO | Admitting: Vascular Surgery

## 2016-11-16 ENCOUNTER — Encounter: Payer: Self-pay | Admitting: Vascular Surgery

## 2016-11-16 VITALS — BP 179/63 | HR 60 | Temp 97.6°F | Ht 65.0 in | Wt 157.0 lb

## 2016-11-16 DIAGNOSIS — Z992 Dependence on renal dialysis: Secondary | ICD-10-CM

## 2016-11-16 DIAGNOSIS — N186 End stage renal disease: Secondary | ICD-10-CM

## 2016-11-16 DIAGNOSIS — Z48812 Encounter for surgical aftercare following surgery on the circulatory system: Secondary | ICD-10-CM

## 2016-11-16 NOTE — Progress Notes (Signed)
Patient name: Alexander Whitaker MRN: [id number] DOB: 07/11/1950 Sex: male  REASON FOR VISIT:    Follow up after right brachiocephalic AV fistula  HPI:   Alexander Whitaker is a pleasant 66 y.o. male who dialyzes on Tuesdays Thursdays and Saturdays. He had a right brachiocephalic AV fistula placed on 10/07/2016. He comes in for an outpatient visit. He has no specific complaints. He denies pain or paresthesias in the right arm.  Current Outpatient Prescriptions  Medication Sig Dispense Refill  . albuterol (PROVENTIL HFA;VENTOLIN HFA) 108 (90 Base) MCG/ACT inhaler Inhale 2 puffs into the lungs every 6 (six) hours as needed for wheezing or shortness of breath.    [name] amLODipine (NORVASC) 10 MG tablet Take 10 mg by mouth daily.     . calcium acetate (PHOSLO) 667 MG capsule Take 1,334 mg by mouth 3 (three) times daily with meals.    . camphor-menthol (SARNA) lotion Apply 1 application topically as needed for itching.    . diphenhydrAMINE (BENADRYL) 25 mg capsule Take 25 mg by mouth daily as needed for itching.    . hydrALAZINE (APRESOLINE) 25 MG tablet Take 25 mg by mouth 2 (two) times daily.    . hydrOXYzine (ATARAX/VISTARIL) 25 MG tablet Take 25 mg by mouth 2 (two) times daily as needed for itching.     . Insulin Detemir (LEVEMIR) 100 UNIT/ML Pen Inject 10 Units into the skin daily at 12 noon.    [name] levothyroxine (SYNTHROID, LEVOTHROID) 50 MCG tablet Take 50 mcg by mouth daily before breakfast.     . loratadine (CLARITIN) 10 MG tablet Take 10 mg by mouth daily as needed for rhinitis.     . Melatonin 10 MG CAPS Take by mouth.    . metoprolol tartrate (LOPRESSOR) 25 MG tablet Take 25 mg by mouth 2 (two) times daily.    [name] oxyCODONE-acetaminophen (PERCOCET/ROXICET) 5-325 MG tablet Take 1 tablet by mouth every 6 (six) hours as needed. 6 tablet 0  . pramipexole (MIRAPEX) 0.5 MG tablet Take 0.5 mg by mouth at bedtime as needed (sleep).    . promethazine (PHENERGAN) 25 MG tablet Take 25 mg by mouth every 6  (six) hours as needed for nausea or vomiting.     No current facility-administered medications for this visit.     REVIEW OF SYSTEMS:  $RemoveB'[X]'caeoDAkf$  denotes positive finding, $RemoveBeforeDEI'[ ]'YPEmHPnjiylzshde$  denotes negative finding Cardiac  Comments:  Chest pain or chest pressure:    Shortness of breath upon exertion:    Short of breath when lying flat:    Irregular heart rhythm:    Constitutional    Fever or chills:     PHYSICAL EXAM:   Vitals:   11/16/16 1542  BP: (!) 179/63  Pulse: 60  Temp: 97.6 F (36.4 C)  TempSrc: Oral  SpO2: 96%  Weight: 157 lb (71.2 kg)  Height: $Remove'5\' 5"'ZILslEd$  (1.651 m)    GENERAL: The patient is a well-nourished male, in no acute distress. The vital signs are documented above. CARDIOVASCULAR: There is a regular rate and rhythm. PULMONARY: There is good air exchange bilaterally without wheezing or rales. The fistula has a palpable thrill although slightly weak. It appears to be gradually enlarging. He is only a little over a month postop.  DATA:   DUPLEX OF RIGHT BRACHIOCEPHALIC AV FISTULA: I reviewed the duplex of the right brachiocephalic AV fistula that was done yesterday. The diameters of the fistula range from 0.49-0.7 cm. There are some elevated velocities at the proximal anastomosis.  MEDICAL ISSUES:   STATUS POST RIGHT BRACHIOCEPHALIC AV FISTULA: The fistula appears to be enlarging gradually. He is only a little over a month out. I'll arrange for a duplex scan in early October and office visit and if the fistula looks good then and they can begin using it for dialysis in mid October. If there are any issues at that time then we would need to proceed with a fistulogram first. His catheter is functioning well.  [name] [facility] [phone]

## 2016-11-17 DIAGNOSIS — D689 Coagulation defect, unspecified: Secondary | ICD-10-CM | POA: Diagnosis not present

## 2016-11-17 DIAGNOSIS — N186 End stage renal disease: Secondary | ICD-10-CM | POA: Diagnosis not present

## 2016-11-17 DIAGNOSIS — D631 Anemia in chronic kidney disease: Secondary | ICD-10-CM | POA: Diagnosis not present

## 2016-11-17 DIAGNOSIS — E1129 Type 2 diabetes mellitus with other diabetic kidney complication: Secondary | ICD-10-CM | POA: Diagnosis not present

## 2016-11-17 DIAGNOSIS — R52 Pain, unspecified: Secondary | ICD-10-CM | POA: Diagnosis not present

## 2016-11-17 DIAGNOSIS — R197 Diarrhea, unspecified: Secondary | ICD-10-CM | POA: Diagnosis not present

## 2016-11-17 DIAGNOSIS — N2581 Secondary hyperparathyroidism of renal origin: Secondary | ICD-10-CM | POA: Diagnosis not present

## 2016-11-17 DIAGNOSIS — D509 Iron deficiency anemia, unspecified: Secondary | ICD-10-CM | POA: Diagnosis not present

## 2016-11-17 DIAGNOSIS — Z23 Encounter for immunization: Secondary | ICD-10-CM | POA: Diagnosis not present

## 2016-11-17 NOTE — Addendum Note (Signed)
Addended by: Lianne Cure A on: 11/17/2016 09:18 AM   Modules accepted: Orders

## 2016-11-19 DIAGNOSIS — R197 Diarrhea, unspecified: Secondary | ICD-10-CM | POA: Diagnosis not present

## 2016-11-19 DIAGNOSIS — E1129 Type 2 diabetes mellitus with other diabetic kidney complication: Secondary | ICD-10-CM | POA: Diagnosis not present

## 2016-11-19 DIAGNOSIS — D509 Iron deficiency anemia, unspecified: Secondary | ICD-10-CM | POA: Diagnosis not present

## 2016-11-19 DIAGNOSIS — N186 End stage renal disease: Secondary | ICD-10-CM | POA: Diagnosis not present

## 2016-11-19 DIAGNOSIS — N2581 Secondary hyperparathyroidism of renal origin: Secondary | ICD-10-CM | POA: Diagnosis not present

## 2016-11-19 DIAGNOSIS — Z23 Encounter for immunization: Secondary | ICD-10-CM | POA: Diagnosis not present

## 2016-11-19 DIAGNOSIS — D631 Anemia in chronic kidney disease: Secondary | ICD-10-CM | POA: Diagnosis not present

## 2016-11-19 DIAGNOSIS — R52 Pain, unspecified: Secondary | ICD-10-CM | POA: Diagnosis not present

## 2016-11-19 DIAGNOSIS — D689 Coagulation defect, unspecified: Secondary | ICD-10-CM | POA: Diagnosis not present

## 2016-11-22 DIAGNOSIS — N186 End stage renal disease: Secondary | ICD-10-CM | POA: Diagnosis not present

## 2016-11-22 DIAGNOSIS — Z23 Encounter for immunization: Secondary | ICD-10-CM | POA: Diagnosis not present

## 2016-11-22 DIAGNOSIS — N2581 Secondary hyperparathyroidism of renal origin: Secondary | ICD-10-CM | POA: Diagnosis not present

## 2016-11-22 DIAGNOSIS — D689 Coagulation defect, unspecified: Secondary | ICD-10-CM | POA: Diagnosis not present

## 2016-11-22 DIAGNOSIS — E1129 Type 2 diabetes mellitus with other diabetic kidney complication: Secondary | ICD-10-CM | POA: Diagnosis not present

## 2016-11-22 DIAGNOSIS — R52 Pain, unspecified: Secondary | ICD-10-CM | POA: Diagnosis not present

## 2016-11-22 DIAGNOSIS — D509 Iron deficiency anemia, unspecified: Secondary | ICD-10-CM | POA: Diagnosis not present

## 2016-11-22 DIAGNOSIS — R197 Diarrhea, unspecified: Secondary | ICD-10-CM | POA: Diagnosis not present

## 2016-11-22 DIAGNOSIS — D631 Anemia in chronic kidney disease: Secondary | ICD-10-CM | POA: Diagnosis not present

## 2016-11-24 DIAGNOSIS — D509 Iron deficiency anemia, unspecified: Secondary | ICD-10-CM | POA: Diagnosis not present

## 2016-11-24 DIAGNOSIS — R197 Diarrhea, unspecified: Secondary | ICD-10-CM | POA: Diagnosis not present

## 2016-11-24 DIAGNOSIS — E1129 Type 2 diabetes mellitus with other diabetic kidney complication: Secondary | ICD-10-CM | POA: Diagnosis not present

## 2016-11-24 DIAGNOSIS — Z23 Encounter for immunization: Secondary | ICD-10-CM | POA: Diagnosis not present

## 2016-11-24 DIAGNOSIS — N2581 Secondary hyperparathyroidism of renal origin: Secondary | ICD-10-CM | POA: Diagnosis not present

## 2016-11-24 DIAGNOSIS — N186 End stage renal disease: Secondary | ICD-10-CM | POA: Diagnosis not present

## 2016-11-24 DIAGNOSIS — R52 Pain, unspecified: Secondary | ICD-10-CM | POA: Diagnosis not present

## 2016-11-24 DIAGNOSIS — D631 Anemia in chronic kidney disease: Secondary | ICD-10-CM | POA: Diagnosis not present

## 2016-11-24 DIAGNOSIS — D689 Coagulation defect, unspecified: Secondary | ICD-10-CM | POA: Diagnosis not present

## 2016-11-25 DIAGNOSIS — E1129 Type 2 diabetes mellitus with other diabetic kidney complication: Secondary | ICD-10-CM | POA: Diagnosis not present

## 2016-11-25 DIAGNOSIS — G2581 Restless legs syndrome: Secondary | ICD-10-CM | POA: Diagnosis not present

## 2016-11-25 DIAGNOSIS — E785 Hyperlipidemia, unspecified: Secondary | ICD-10-CM | POA: Diagnosis not present

## 2016-11-25 DIAGNOSIS — E039 Hypothyroidism, unspecified: Secondary | ICD-10-CM | POA: Diagnosis not present

## 2016-11-25 DIAGNOSIS — Z6825 Body mass index (BMI) 25.0-25.9, adult: Secondary | ICD-10-CM | POA: Diagnosis not present

## 2016-11-25 DIAGNOSIS — N186 End stage renal disease: Secondary | ICD-10-CM | POA: Diagnosis not present

## 2016-11-25 DIAGNOSIS — I129 Hypertensive chronic kidney disease with stage 1 through stage 4 chronic kidney disease, or unspecified chronic kidney disease: Secondary | ICD-10-CM | POA: Diagnosis not present

## 2016-11-25 DIAGNOSIS — Z992 Dependence on renal dialysis: Secondary | ICD-10-CM | POA: Diagnosis not present

## 2016-11-25 DIAGNOSIS — L299 Pruritus, unspecified: Secondary | ICD-10-CM | POA: Diagnosis not present

## 2016-11-26 DIAGNOSIS — N186 End stage renal disease: Secondary | ICD-10-CM | POA: Diagnosis not present

## 2016-11-26 DIAGNOSIS — D689 Coagulation defect, unspecified: Secondary | ICD-10-CM | POA: Diagnosis not present

## 2016-11-26 DIAGNOSIS — D631 Anemia in chronic kidney disease: Secondary | ICD-10-CM | POA: Diagnosis not present

## 2016-11-26 DIAGNOSIS — E1129 Type 2 diabetes mellitus with other diabetic kidney complication: Secondary | ICD-10-CM | POA: Diagnosis not present

## 2016-11-26 DIAGNOSIS — D509 Iron deficiency anemia, unspecified: Secondary | ICD-10-CM | POA: Diagnosis not present

## 2016-11-26 DIAGNOSIS — N2581 Secondary hyperparathyroidism of renal origin: Secondary | ICD-10-CM | POA: Diagnosis not present

## 2016-11-29 DIAGNOSIS — D509 Iron deficiency anemia, unspecified: Secondary | ICD-10-CM | POA: Diagnosis not present

## 2016-11-29 DIAGNOSIS — E1129 Type 2 diabetes mellitus with other diabetic kidney complication: Secondary | ICD-10-CM | POA: Diagnosis not present

## 2016-11-29 DIAGNOSIS — D631 Anemia in chronic kidney disease: Secondary | ICD-10-CM | POA: Diagnosis not present

## 2016-11-29 DIAGNOSIS — N2581 Secondary hyperparathyroidism of renal origin: Secondary | ICD-10-CM | POA: Diagnosis not present

## 2016-11-29 DIAGNOSIS — D689 Coagulation defect, unspecified: Secondary | ICD-10-CM | POA: Diagnosis not present

## 2016-11-29 DIAGNOSIS — N186 End stage renal disease: Secondary | ICD-10-CM | POA: Diagnosis not present

## 2016-12-01 DIAGNOSIS — D689 Coagulation defect, unspecified: Secondary | ICD-10-CM | POA: Diagnosis not present

## 2016-12-01 DIAGNOSIS — N2581 Secondary hyperparathyroidism of renal origin: Secondary | ICD-10-CM | POA: Diagnosis not present

## 2016-12-01 DIAGNOSIS — N186 End stage renal disease: Secondary | ICD-10-CM | POA: Diagnosis not present

## 2016-12-01 DIAGNOSIS — D631 Anemia in chronic kidney disease: Secondary | ICD-10-CM | POA: Diagnosis not present

## 2016-12-01 DIAGNOSIS — E1129 Type 2 diabetes mellitus with other diabetic kidney complication: Secondary | ICD-10-CM | POA: Diagnosis not present

## 2016-12-01 DIAGNOSIS — D509 Iron deficiency anemia, unspecified: Secondary | ICD-10-CM | POA: Diagnosis not present

## 2016-12-03 DIAGNOSIS — D509 Iron deficiency anemia, unspecified: Secondary | ICD-10-CM | POA: Diagnosis not present

## 2016-12-03 DIAGNOSIS — E1129 Type 2 diabetes mellitus with other diabetic kidney complication: Secondary | ICD-10-CM | POA: Diagnosis not present

## 2016-12-03 DIAGNOSIS — N186 End stage renal disease: Secondary | ICD-10-CM | POA: Diagnosis not present

## 2016-12-03 DIAGNOSIS — N2581 Secondary hyperparathyroidism of renal origin: Secondary | ICD-10-CM | POA: Diagnosis not present

## 2016-12-03 DIAGNOSIS — D631 Anemia in chronic kidney disease: Secondary | ICD-10-CM | POA: Diagnosis not present

## 2016-12-03 DIAGNOSIS — D689 Coagulation defect, unspecified: Secondary | ICD-10-CM | POA: Diagnosis not present

## 2016-12-06 DIAGNOSIS — E1129 Type 2 diabetes mellitus with other diabetic kidney complication: Secondary | ICD-10-CM | POA: Diagnosis not present

## 2016-12-06 DIAGNOSIS — N186 End stage renal disease: Secondary | ICD-10-CM | POA: Diagnosis not present

## 2016-12-06 DIAGNOSIS — N2581 Secondary hyperparathyroidism of renal origin: Secondary | ICD-10-CM | POA: Diagnosis not present

## 2016-12-06 DIAGNOSIS — D509 Iron deficiency anemia, unspecified: Secondary | ICD-10-CM | POA: Diagnosis not present

## 2016-12-06 DIAGNOSIS — D631 Anemia in chronic kidney disease: Secondary | ICD-10-CM | POA: Diagnosis not present

## 2016-12-06 DIAGNOSIS — D689 Coagulation defect, unspecified: Secondary | ICD-10-CM | POA: Diagnosis not present

## 2016-12-08 DIAGNOSIS — D509 Iron deficiency anemia, unspecified: Secondary | ICD-10-CM | POA: Diagnosis not present

## 2016-12-08 DIAGNOSIS — N186 End stage renal disease: Secondary | ICD-10-CM | POA: Diagnosis not present

## 2016-12-08 DIAGNOSIS — D631 Anemia in chronic kidney disease: Secondary | ICD-10-CM | POA: Diagnosis not present

## 2016-12-08 DIAGNOSIS — E1129 Type 2 diabetes mellitus with other diabetic kidney complication: Secondary | ICD-10-CM | POA: Diagnosis not present

## 2016-12-08 DIAGNOSIS — N2581 Secondary hyperparathyroidism of renal origin: Secondary | ICD-10-CM | POA: Diagnosis not present

## 2016-12-08 DIAGNOSIS — D689 Coagulation defect, unspecified: Secondary | ICD-10-CM | POA: Diagnosis not present

## 2016-12-10 DIAGNOSIS — N2581 Secondary hyperparathyroidism of renal origin: Secondary | ICD-10-CM | POA: Diagnosis not present

## 2016-12-10 DIAGNOSIS — D689 Coagulation defect, unspecified: Secondary | ICD-10-CM | POA: Diagnosis not present

## 2016-12-10 DIAGNOSIS — E1129 Type 2 diabetes mellitus with other diabetic kidney complication: Secondary | ICD-10-CM | POA: Diagnosis not present

## 2016-12-10 DIAGNOSIS — D509 Iron deficiency anemia, unspecified: Secondary | ICD-10-CM | POA: Diagnosis not present

## 2016-12-10 DIAGNOSIS — D631 Anemia in chronic kidney disease: Secondary | ICD-10-CM | POA: Diagnosis not present

## 2016-12-10 DIAGNOSIS — N186 End stage renal disease: Secondary | ICD-10-CM | POA: Diagnosis not present

## 2016-12-13 DIAGNOSIS — D509 Iron deficiency anemia, unspecified: Secondary | ICD-10-CM | POA: Diagnosis not present

## 2016-12-13 DIAGNOSIS — D689 Coagulation defect, unspecified: Secondary | ICD-10-CM | POA: Diagnosis not present

## 2016-12-13 DIAGNOSIS — N2581 Secondary hyperparathyroidism of renal origin: Secondary | ICD-10-CM | POA: Diagnosis not present

## 2016-12-13 DIAGNOSIS — N186 End stage renal disease: Secondary | ICD-10-CM | POA: Diagnosis not present

## 2016-12-13 DIAGNOSIS — E1129 Type 2 diabetes mellitus with other diabetic kidney complication: Secondary | ICD-10-CM | POA: Diagnosis not present

## 2016-12-13 DIAGNOSIS — D631 Anemia in chronic kidney disease: Secondary | ICD-10-CM | POA: Diagnosis not present

## 2016-12-15 DIAGNOSIS — D509 Iron deficiency anemia, unspecified: Secondary | ICD-10-CM | POA: Diagnosis not present

## 2016-12-15 DIAGNOSIS — E1129 Type 2 diabetes mellitus with other diabetic kidney complication: Secondary | ICD-10-CM | POA: Diagnosis not present

## 2016-12-15 DIAGNOSIS — D631 Anemia in chronic kidney disease: Secondary | ICD-10-CM | POA: Diagnosis not present

## 2016-12-15 DIAGNOSIS — N186 End stage renal disease: Secondary | ICD-10-CM | POA: Diagnosis not present

## 2016-12-15 DIAGNOSIS — D689 Coagulation defect, unspecified: Secondary | ICD-10-CM | POA: Diagnosis not present

## 2016-12-15 DIAGNOSIS — N2581 Secondary hyperparathyroidism of renal origin: Secondary | ICD-10-CM | POA: Diagnosis not present

## 2016-12-17 DIAGNOSIS — D689 Coagulation defect, unspecified: Secondary | ICD-10-CM | POA: Diagnosis not present

## 2016-12-17 DIAGNOSIS — N2581 Secondary hyperparathyroidism of renal origin: Secondary | ICD-10-CM | POA: Diagnosis not present

## 2016-12-17 DIAGNOSIS — N186 End stage renal disease: Secondary | ICD-10-CM | POA: Diagnosis not present

## 2016-12-17 DIAGNOSIS — E1129 Type 2 diabetes mellitus with other diabetic kidney complication: Secondary | ICD-10-CM | POA: Diagnosis not present

## 2016-12-17 DIAGNOSIS — D509 Iron deficiency anemia, unspecified: Secondary | ICD-10-CM | POA: Diagnosis not present

## 2016-12-17 DIAGNOSIS — D631 Anemia in chronic kidney disease: Secondary | ICD-10-CM | POA: Diagnosis not present

## 2016-12-20 DIAGNOSIS — N2581 Secondary hyperparathyroidism of renal origin: Secondary | ICD-10-CM | POA: Diagnosis not present

## 2016-12-20 DIAGNOSIS — N186 End stage renal disease: Secondary | ICD-10-CM | POA: Diagnosis not present

## 2016-12-20 DIAGNOSIS — D509 Iron deficiency anemia, unspecified: Secondary | ICD-10-CM | POA: Diagnosis not present

## 2016-12-20 DIAGNOSIS — D631 Anemia in chronic kidney disease: Secondary | ICD-10-CM | POA: Diagnosis not present

## 2016-12-20 DIAGNOSIS — E1129 Type 2 diabetes mellitus with other diabetic kidney complication: Secondary | ICD-10-CM | POA: Diagnosis not present

## 2016-12-20 DIAGNOSIS — D689 Coagulation defect, unspecified: Secondary | ICD-10-CM | POA: Diagnosis not present

## 2016-12-22 DIAGNOSIS — D689 Coagulation defect, unspecified: Secondary | ICD-10-CM | POA: Diagnosis not present

## 2016-12-22 DIAGNOSIS — N186 End stage renal disease: Secondary | ICD-10-CM | POA: Diagnosis not present

## 2016-12-22 DIAGNOSIS — E1129 Type 2 diabetes mellitus with other diabetic kidney complication: Secondary | ICD-10-CM | POA: Diagnosis not present

## 2016-12-22 DIAGNOSIS — D509 Iron deficiency anemia, unspecified: Secondary | ICD-10-CM | POA: Diagnosis not present

## 2016-12-22 DIAGNOSIS — N2581 Secondary hyperparathyroidism of renal origin: Secondary | ICD-10-CM | POA: Diagnosis not present

## 2016-12-22 DIAGNOSIS — D631 Anemia in chronic kidney disease: Secondary | ICD-10-CM | POA: Diagnosis not present

## 2016-12-24 DIAGNOSIS — N2581 Secondary hyperparathyroidism of renal origin: Secondary | ICD-10-CM | POA: Diagnosis not present

## 2016-12-24 DIAGNOSIS — N186 End stage renal disease: Secondary | ICD-10-CM | POA: Diagnosis not present

## 2016-12-24 DIAGNOSIS — E1129 Type 2 diabetes mellitus with other diabetic kidney complication: Secondary | ICD-10-CM | POA: Diagnosis not present

## 2016-12-24 DIAGNOSIS — D631 Anemia in chronic kidney disease: Secondary | ICD-10-CM | POA: Diagnosis not present

## 2016-12-24 DIAGNOSIS — D689 Coagulation defect, unspecified: Secondary | ICD-10-CM | POA: Diagnosis not present

## 2016-12-24 DIAGNOSIS — D509 Iron deficiency anemia, unspecified: Secondary | ICD-10-CM | POA: Diagnosis not present

## 2016-12-25 DIAGNOSIS — N186 End stage renal disease: Secondary | ICD-10-CM | POA: Diagnosis not present

## 2016-12-25 DIAGNOSIS — Z992 Dependence on renal dialysis: Secondary | ICD-10-CM | POA: Diagnosis not present

## 2016-12-25 DIAGNOSIS — E1129 Type 2 diabetes mellitus with other diabetic kidney complication: Secondary | ICD-10-CM | POA: Diagnosis not present

## 2016-12-27 DIAGNOSIS — D689 Coagulation defect, unspecified: Secondary | ICD-10-CM | POA: Diagnosis not present

## 2016-12-27 DIAGNOSIS — R52 Pain, unspecified: Secondary | ICD-10-CM | POA: Diagnosis not present

## 2016-12-27 DIAGNOSIS — N186 End stage renal disease: Secondary | ICD-10-CM | POA: Diagnosis not present

## 2016-12-27 DIAGNOSIS — L299 Pruritus, unspecified: Secondary | ICD-10-CM | POA: Diagnosis not present

## 2016-12-27 DIAGNOSIS — D631 Anemia in chronic kidney disease: Secondary | ICD-10-CM | POA: Diagnosis not present

## 2016-12-27 DIAGNOSIS — D509 Iron deficiency anemia, unspecified: Secondary | ICD-10-CM | POA: Diagnosis not present

## 2016-12-27 DIAGNOSIS — R197 Diarrhea, unspecified: Secondary | ICD-10-CM | POA: Diagnosis not present

## 2016-12-27 DIAGNOSIS — E1129 Type 2 diabetes mellitus with other diabetic kidney complication: Secondary | ICD-10-CM | POA: Diagnosis not present

## 2016-12-27 DIAGNOSIS — E8770 Fluid overload, unspecified: Secondary | ICD-10-CM | POA: Diagnosis not present

## 2016-12-29 DIAGNOSIS — R52 Pain, unspecified: Secondary | ICD-10-CM | POA: Diagnosis not present

## 2016-12-29 DIAGNOSIS — E1129 Type 2 diabetes mellitus with other diabetic kidney complication: Secondary | ICD-10-CM | POA: Diagnosis not present

## 2016-12-29 DIAGNOSIS — L299 Pruritus, unspecified: Secondary | ICD-10-CM | POA: Diagnosis not present

## 2016-12-29 DIAGNOSIS — D509 Iron deficiency anemia, unspecified: Secondary | ICD-10-CM | POA: Diagnosis not present

## 2016-12-29 DIAGNOSIS — E8770 Fluid overload, unspecified: Secondary | ICD-10-CM | POA: Diagnosis not present

## 2016-12-29 DIAGNOSIS — D689 Coagulation defect, unspecified: Secondary | ICD-10-CM | POA: Diagnosis not present

## 2016-12-29 DIAGNOSIS — N186 End stage renal disease: Secondary | ICD-10-CM | POA: Diagnosis not present

## 2016-12-29 DIAGNOSIS — D631 Anemia in chronic kidney disease: Secondary | ICD-10-CM | POA: Diagnosis not present

## 2016-12-29 DIAGNOSIS — R197 Diarrhea, unspecified: Secondary | ICD-10-CM | POA: Diagnosis not present

## 2016-12-31 DIAGNOSIS — E8770 Fluid overload, unspecified: Secondary | ICD-10-CM | POA: Diagnosis not present

## 2016-12-31 DIAGNOSIS — D509 Iron deficiency anemia, unspecified: Secondary | ICD-10-CM | POA: Diagnosis not present

## 2016-12-31 DIAGNOSIS — R52 Pain, unspecified: Secondary | ICD-10-CM | POA: Diagnosis not present

## 2016-12-31 DIAGNOSIS — N186 End stage renal disease: Secondary | ICD-10-CM | POA: Diagnosis not present

## 2016-12-31 DIAGNOSIS — R197 Diarrhea, unspecified: Secondary | ICD-10-CM | POA: Diagnosis not present

## 2016-12-31 DIAGNOSIS — D689 Coagulation defect, unspecified: Secondary | ICD-10-CM | POA: Diagnosis not present

## 2016-12-31 DIAGNOSIS — E1129 Type 2 diabetes mellitus with other diabetic kidney complication: Secondary | ICD-10-CM | POA: Diagnosis not present

## 2016-12-31 DIAGNOSIS — L299 Pruritus, unspecified: Secondary | ICD-10-CM | POA: Diagnosis not present

## 2016-12-31 DIAGNOSIS — D631 Anemia in chronic kidney disease: Secondary | ICD-10-CM | POA: Diagnosis not present

## 2017-01-03 DIAGNOSIS — L299 Pruritus, unspecified: Secondary | ICD-10-CM | POA: Diagnosis not present

## 2017-01-03 DIAGNOSIS — D689 Coagulation defect, unspecified: Secondary | ICD-10-CM | POA: Diagnosis not present

## 2017-01-03 DIAGNOSIS — E1129 Type 2 diabetes mellitus with other diabetic kidney complication: Secondary | ICD-10-CM | POA: Diagnosis not present

## 2017-01-03 DIAGNOSIS — D509 Iron deficiency anemia, unspecified: Secondary | ICD-10-CM | POA: Diagnosis not present

## 2017-01-03 DIAGNOSIS — N186 End stage renal disease: Secondary | ICD-10-CM | POA: Diagnosis not present

## 2017-01-03 DIAGNOSIS — R52 Pain, unspecified: Secondary | ICD-10-CM | POA: Diagnosis not present

## 2017-01-03 DIAGNOSIS — R197 Diarrhea, unspecified: Secondary | ICD-10-CM | POA: Diagnosis not present

## 2017-01-03 DIAGNOSIS — E8770 Fluid overload, unspecified: Secondary | ICD-10-CM | POA: Diagnosis not present

## 2017-01-03 DIAGNOSIS — D631 Anemia in chronic kidney disease: Secondary | ICD-10-CM | POA: Diagnosis not present

## 2017-01-04 ENCOUNTER — Ambulatory Visit (HOSPITAL_COMMUNITY)
Admission: RE | Admit: 2017-01-04 | Discharge: 2017-01-04 | Disposition: A | Discharge status: Home / Self Care | Payer: Medicare HMO | Source: Ambulatory Visit | Attending: Vascular Surgery | Admitting: Vascular Surgery

## 2017-01-04 ENCOUNTER — Encounter: Payer: Self-pay | Admitting: Vascular Surgery

## 2017-01-04 ENCOUNTER — Ambulatory Visit (INDEPENDENT_AMBULATORY_CARE_PROVIDER_SITE_OTHER): Payer: Self-pay | Admitting: Vascular Surgery

## 2017-01-04 VITALS — BP 144/71 | HR 77 | Temp 97.9°F | Resp 16 | Ht 65.0 in | Wt 160.7 lb

## 2017-01-04 DIAGNOSIS — L299 Pruritus, unspecified: Secondary | ICD-10-CM | POA: Diagnosis not present

## 2017-01-04 DIAGNOSIS — N186 End stage renal disease: Secondary | ICD-10-CM | POA: Diagnosis not present

## 2017-01-04 DIAGNOSIS — D509 Iron deficiency anemia, unspecified: Secondary | ICD-10-CM | POA: Diagnosis not present

## 2017-01-04 DIAGNOSIS — R52 Pain, unspecified: Secondary | ICD-10-CM | POA: Diagnosis not present

## 2017-01-04 DIAGNOSIS — D689 Coagulation defect, unspecified: Secondary | ICD-10-CM | POA: Diagnosis not present

## 2017-01-04 DIAGNOSIS — Z992 Dependence on renal dialysis: Secondary | ICD-10-CM | POA: Diagnosis not present

## 2017-01-04 DIAGNOSIS — E1129 Type 2 diabetes mellitus with other diabetic kidney complication: Secondary | ICD-10-CM | POA: Diagnosis not present

## 2017-01-04 DIAGNOSIS — E8770 Fluid overload, unspecified: Secondary | ICD-10-CM | POA: Diagnosis not present

## 2017-01-04 DIAGNOSIS — Z48812 Encounter for surgical aftercare following surgery on the circulatory system: Secondary | ICD-10-CM | POA: Insufficient documentation

## 2017-01-04 DIAGNOSIS — R197 Diarrhea, unspecified: Secondary | ICD-10-CM | POA: Diagnosis not present

## 2017-01-04 DIAGNOSIS — D631 Anemia in chronic kidney disease: Secondary | ICD-10-CM | POA: Diagnosis not present

## 2017-01-04 NOTE — Progress Notes (Signed)
Patient name: Alexander Whitaker MRN: [id number] DOB: 1950/05/11 Sex: male  REASON FOR VISIT:    Follow up after right brachiocephalic AV fistula  HPI:   Alexander Whitaker is a pleasant 66 y.o. male who dialyzes on Tuesdays Thursdays and Saturdays. He has a functioning right IJ tunneled dialysis catheter. I created a right brachiocephalic fistula on 29/92/4268. He comes in for a follow up visit. He denies pain or paresthesias in his right arm.  Current Outpatient Prescriptions  Medication Sig Dispense Refill  . albuterol (PROVENTIL HFA;VENTOLIN HFA) 108 (90 Base) MCG/ACT inhaler Inhale 2 puffs into the lungs every 6 (six) hours as needed for wheezing or shortness of breath.    [name] amLODipine (NORVASC) 10 MG tablet Take 10 mg by mouth daily.     . calcium acetate (PHOSLO) 667 MG capsule Take 1,334 mg by mouth 3 (three) times daily with meals.    . camphor-menthol (SARNA) lotion Apply 1 application topically as needed for itching.    . diphenhydrAMINE (BENADRYL) 25 mg capsule Take 25 mg by mouth daily as needed for itching.    . hydrOXYzine (ATARAX/VISTARIL) 25 MG tablet Take 25 mg by mouth 2 (two) times daily as needed for itching.     . Insulin Detemir (LEVEMIR) 100 UNIT/ML Pen Inject 10 Units into the skin daily at 12 noon.    [name] levothyroxine (SYNTHROID, LEVOTHROID) 50 MCG tablet Take 50 mcg by mouth daily before breakfast.     . loratadine (CLARITIN) 10 MG tablet Take 10 mg by mouth daily as needed for rhinitis.     . Melatonin 10 MG CAPS Take by mouth.    . pramipexole (MIRAPEX) 0.5 MG tablet Take 0.5 mg by mouth at bedtime as needed (sleep).    . promethazine (PHENERGAN) 25 MG tablet Take 25 mg by mouth every 6 (six) hours as needed for nausea or vomiting.     No current facility-administered medications for this visit.     REVIEW OF SYSTEMS:  $RemoveB'[X]'FSoiONqw$  denotes positive finding, $RemoveBeforeDEI'[ ]'sYNHtgEaNelNLjes$  denotes negative finding Cardiac  Comments:  Chest pain or chest pressure:    Shortness of breath upon  exertion:    Short of breath when lying flat:    Irregular heart rhythm:    Constitutional    Fever or chills:     PHYSICAL EXAM:   Vitals:   01/04/17 1530  BP: (!) 144/71  Pulse: 77  Resp: 16  Temp: 97.9 F (36.6 C)  TempSrc: Other (Comment)  SpO2: 97%  Weight: 160 lb 11.2 oz (72.9 kg)  Height: $Remove'5\' 5"'sAqxyuj$  (1.651 m)    GENERAL: The patient is a well-nourished male, in no acute distress. The vital signs are documented above. CARDIOVASCULAR: There is a regular rate and rhythm. PULMONARY: There is good air exchange bilaterally without wheezing or rales. His fistula has a slightly weak thrill. The size of the vein looks good. He has a diminished but palpable right radial pulse. The right hand is warm and well perfused.  DATA:   DUPLEX RIGHT BRACHIOCEPHALIC AV FISTULA: Duplex of his AV fistula shows that the diameters of the fistula range from 0.46-0.75 cm. There are some elevated velocities at the proximal anastomosis which are stable.  MEDICAL ISSUES:   STATUS POST RIGHT BRACHIOCEPHALIC AV FISTULA: Based on the diameters of the fistula, I think that it would be reasonable to begin using the fistula next week. It will be 3 months. I do not see any significant stenosis within the fistula  except for the elevated velocities at the proximal end. There were no significant competing branches. If there are any problems with flow in the fistula then certainly we could consider a fistulogram to further of I weight the elevated velocities at the anastomosis.  [name] [facility] [place] [phone]

## 2017-01-05 DIAGNOSIS — D689 Coagulation defect, unspecified: Secondary | ICD-10-CM | POA: Diagnosis not present

## 2017-01-05 DIAGNOSIS — L299 Pruritus, unspecified: Secondary | ICD-10-CM | POA: Diagnosis not present

## 2017-01-05 DIAGNOSIS — E1129 Type 2 diabetes mellitus with other diabetic kidney complication: Secondary | ICD-10-CM | POA: Diagnosis not present

## 2017-01-05 DIAGNOSIS — E113313 Type 2 diabetes mellitus with moderate nonproliferative diabetic retinopathy with macular edema, bilateral: Secondary | ICD-10-CM | POA: Diagnosis not present

## 2017-01-05 DIAGNOSIS — D631 Anemia in chronic kidney disease: Secondary | ICD-10-CM | POA: Diagnosis not present

## 2017-01-05 DIAGNOSIS — D509 Iron deficiency anemia, unspecified: Secondary | ICD-10-CM | POA: Diagnosis not present

## 2017-01-05 DIAGNOSIS — R52 Pain, unspecified: Secondary | ICD-10-CM | POA: Diagnosis not present

## 2017-01-05 DIAGNOSIS — R197 Diarrhea, unspecified: Secondary | ICD-10-CM | POA: Diagnosis not present

## 2017-01-05 DIAGNOSIS — N186 End stage renal disease: Secondary | ICD-10-CM | POA: Diagnosis not present

## 2017-01-05 DIAGNOSIS — E8770 Fluid overload, unspecified: Secondary | ICD-10-CM | POA: Diagnosis not present

## 2017-01-07 DIAGNOSIS — D689 Coagulation defect, unspecified: Secondary | ICD-10-CM | POA: Diagnosis not present

## 2017-01-07 DIAGNOSIS — E8770 Fluid overload, unspecified: Secondary | ICD-10-CM | POA: Diagnosis not present

## 2017-01-07 DIAGNOSIS — D509 Iron deficiency anemia, unspecified: Secondary | ICD-10-CM | POA: Diagnosis not present

## 2017-01-07 DIAGNOSIS — E1129 Type 2 diabetes mellitus with other diabetic kidney complication: Secondary | ICD-10-CM | POA: Diagnosis not present

## 2017-01-07 DIAGNOSIS — D631 Anemia in chronic kidney disease: Secondary | ICD-10-CM | POA: Diagnosis not present

## 2017-01-07 DIAGNOSIS — R52 Pain, unspecified: Secondary | ICD-10-CM | POA: Diagnosis not present

## 2017-01-07 DIAGNOSIS — L299 Pruritus, unspecified: Secondary | ICD-10-CM | POA: Diagnosis not present

## 2017-01-07 DIAGNOSIS — N186 End stage renal disease: Secondary | ICD-10-CM | POA: Diagnosis not present

## 2017-01-07 DIAGNOSIS — R197 Diarrhea, unspecified: Secondary | ICD-10-CM | POA: Diagnosis not present

## 2017-01-10 DIAGNOSIS — E8770 Fluid overload, unspecified: Secondary | ICD-10-CM | POA: Diagnosis not present

## 2017-01-10 DIAGNOSIS — D689 Coagulation defect, unspecified: Secondary | ICD-10-CM | POA: Diagnosis not present

## 2017-01-10 DIAGNOSIS — R52 Pain, unspecified: Secondary | ICD-10-CM | POA: Diagnosis not present

## 2017-01-10 DIAGNOSIS — N186 End stage renal disease: Secondary | ICD-10-CM | POA: Diagnosis not present

## 2017-01-10 DIAGNOSIS — R197 Diarrhea, unspecified: Secondary | ICD-10-CM | POA: Diagnosis not present

## 2017-01-10 DIAGNOSIS — E1129 Type 2 diabetes mellitus with other diabetic kidney complication: Secondary | ICD-10-CM | POA: Diagnosis not present

## 2017-01-10 DIAGNOSIS — D509 Iron deficiency anemia, unspecified: Secondary | ICD-10-CM | POA: Diagnosis not present

## 2017-01-10 DIAGNOSIS — D631 Anemia in chronic kidney disease: Secondary | ICD-10-CM | POA: Diagnosis not present

## 2017-01-10 DIAGNOSIS — L299 Pruritus, unspecified: Secondary | ICD-10-CM | POA: Diagnosis not present

## 2017-01-12 DIAGNOSIS — E8770 Fluid overload, unspecified: Secondary | ICD-10-CM | POA: Diagnosis not present

## 2017-01-12 DIAGNOSIS — R52 Pain, unspecified: Secondary | ICD-10-CM | POA: Diagnosis not present

## 2017-01-12 DIAGNOSIS — D509 Iron deficiency anemia, unspecified: Secondary | ICD-10-CM | POA: Diagnosis not present

## 2017-01-12 DIAGNOSIS — D631 Anemia in chronic kidney disease: Secondary | ICD-10-CM | POA: Diagnosis not present

## 2017-01-12 DIAGNOSIS — D689 Coagulation defect, unspecified: Secondary | ICD-10-CM | POA: Diagnosis not present

## 2017-01-12 DIAGNOSIS — E1129 Type 2 diabetes mellitus with other diabetic kidney complication: Secondary | ICD-10-CM | POA: Diagnosis not present

## 2017-01-12 DIAGNOSIS — N186 End stage renal disease: Secondary | ICD-10-CM | POA: Diagnosis not present

## 2017-01-12 DIAGNOSIS — R197 Diarrhea, unspecified: Secondary | ICD-10-CM | POA: Diagnosis not present

## 2017-01-12 DIAGNOSIS — L299 Pruritus, unspecified: Secondary | ICD-10-CM | POA: Diagnosis not present

## 2017-01-13 DIAGNOSIS — R197 Diarrhea, unspecified: Secondary | ICD-10-CM | POA: Diagnosis not present

## 2017-01-13 DIAGNOSIS — D689 Coagulation defect, unspecified: Secondary | ICD-10-CM | POA: Diagnosis not present

## 2017-01-13 DIAGNOSIS — E1129 Type 2 diabetes mellitus with other diabetic kidney complication: Secondary | ICD-10-CM | POA: Diagnosis not present

## 2017-01-13 DIAGNOSIS — D631 Anemia in chronic kidney disease: Secondary | ICD-10-CM | POA: Diagnosis not present

## 2017-01-13 DIAGNOSIS — D509 Iron deficiency anemia, unspecified: Secondary | ICD-10-CM | POA: Diagnosis not present

## 2017-01-13 DIAGNOSIS — L299 Pruritus, unspecified: Secondary | ICD-10-CM | POA: Diagnosis not present

## 2017-01-13 DIAGNOSIS — E8770 Fluid overload, unspecified: Secondary | ICD-10-CM | POA: Diagnosis not present

## 2017-01-13 DIAGNOSIS — R52 Pain, unspecified: Secondary | ICD-10-CM | POA: Diagnosis not present

## 2017-01-13 DIAGNOSIS — N186 End stage renal disease: Secondary | ICD-10-CM | POA: Diagnosis not present

## 2017-01-17 DIAGNOSIS — N186 End stage renal disease: Secondary | ICD-10-CM | POA: Diagnosis not present

## 2017-01-17 DIAGNOSIS — R197 Diarrhea, unspecified: Secondary | ICD-10-CM | POA: Diagnosis not present

## 2017-01-17 DIAGNOSIS — D631 Anemia in chronic kidney disease: Secondary | ICD-10-CM | POA: Diagnosis not present

## 2017-01-17 DIAGNOSIS — D509 Iron deficiency anemia, unspecified: Secondary | ICD-10-CM | POA: Diagnosis not present

## 2017-01-17 DIAGNOSIS — E8770 Fluid overload, unspecified: Secondary | ICD-10-CM | POA: Diagnosis not present

## 2017-01-17 DIAGNOSIS — L299 Pruritus, unspecified: Secondary | ICD-10-CM | POA: Diagnosis not present

## 2017-01-17 DIAGNOSIS — E1129 Type 2 diabetes mellitus with other diabetic kidney complication: Secondary | ICD-10-CM | POA: Diagnosis not present

## 2017-01-17 DIAGNOSIS — D689 Coagulation defect, unspecified: Secondary | ICD-10-CM | POA: Diagnosis not present

## 2017-01-17 DIAGNOSIS — R52 Pain, unspecified: Secondary | ICD-10-CM | POA: Diagnosis not present

## 2017-01-19 DIAGNOSIS — L299 Pruritus, unspecified: Secondary | ICD-10-CM | POA: Diagnosis not present

## 2017-01-19 DIAGNOSIS — N186 End stage renal disease: Secondary | ICD-10-CM | POA: Diagnosis not present

## 2017-01-19 DIAGNOSIS — D631 Anemia in chronic kidney disease: Secondary | ICD-10-CM | POA: Diagnosis not present

## 2017-01-19 DIAGNOSIS — E1129 Type 2 diabetes mellitus with other diabetic kidney complication: Secondary | ICD-10-CM | POA: Diagnosis not present

## 2017-01-19 DIAGNOSIS — R197 Diarrhea, unspecified: Secondary | ICD-10-CM | POA: Diagnosis not present

## 2017-01-19 DIAGNOSIS — D689 Coagulation defect, unspecified: Secondary | ICD-10-CM | POA: Diagnosis not present

## 2017-01-19 DIAGNOSIS — D509 Iron deficiency anemia, unspecified: Secondary | ICD-10-CM | POA: Diagnosis not present

## 2017-01-19 DIAGNOSIS — R52 Pain, unspecified: Secondary | ICD-10-CM | POA: Diagnosis not present

## 2017-01-19 DIAGNOSIS — E8770 Fluid overload, unspecified: Secondary | ICD-10-CM | POA: Diagnosis not present

## 2017-01-21 DIAGNOSIS — D631 Anemia in chronic kidney disease: Secondary | ICD-10-CM | POA: Diagnosis not present

## 2017-01-21 DIAGNOSIS — D689 Coagulation defect, unspecified: Secondary | ICD-10-CM | POA: Diagnosis not present

## 2017-01-21 DIAGNOSIS — L299 Pruritus, unspecified: Secondary | ICD-10-CM | POA: Diagnosis not present

## 2017-01-21 DIAGNOSIS — D509 Iron deficiency anemia, unspecified: Secondary | ICD-10-CM | POA: Diagnosis not present

## 2017-01-21 DIAGNOSIS — R197 Diarrhea, unspecified: Secondary | ICD-10-CM | POA: Diagnosis not present

## 2017-01-21 DIAGNOSIS — N186 End stage renal disease: Secondary | ICD-10-CM | POA: Diagnosis not present

## 2017-01-21 DIAGNOSIS — E1129 Type 2 diabetes mellitus with other diabetic kidney complication: Secondary | ICD-10-CM | POA: Diagnosis not present

## 2017-01-21 DIAGNOSIS — E8770 Fluid overload, unspecified: Secondary | ICD-10-CM | POA: Diagnosis not present

## 2017-01-21 DIAGNOSIS — R52 Pain, unspecified: Secondary | ICD-10-CM | POA: Diagnosis not present

## 2017-01-24 DIAGNOSIS — D509 Iron deficiency anemia, unspecified: Secondary | ICD-10-CM | POA: Diagnosis not present

## 2017-01-24 DIAGNOSIS — L299 Pruritus, unspecified: Secondary | ICD-10-CM | POA: Diagnosis not present

## 2017-01-24 DIAGNOSIS — R52 Pain, unspecified: Secondary | ICD-10-CM | POA: Diagnosis not present

## 2017-01-24 DIAGNOSIS — R197 Diarrhea, unspecified: Secondary | ICD-10-CM | POA: Diagnosis not present

## 2017-01-24 DIAGNOSIS — N186 End stage renal disease: Secondary | ICD-10-CM | POA: Diagnosis not present

## 2017-01-24 DIAGNOSIS — E1129 Type 2 diabetes mellitus with other diabetic kidney complication: Secondary | ICD-10-CM | POA: Diagnosis not present

## 2017-01-24 DIAGNOSIS — D689 Coagulation defect, unspecified: Secondary | ICD-10-CM | POA: Diagnosis not present

## 2017-01-24 DIAGNOSIS — D631 Anemia in chronic kidney disease: Secondary | ICD-10-CM | POA: Diagnosis not present

## 2017-01-24 DIAGNOSIS — E8770 Fluid overload, unspecified: Secondary | ICD-10-CM | POA: Diagnosis not present

## 2017-01-25 DIAGNOSIS — E1129 Type 2 diabetes mellitus with other diabetic kidney complication: Secondary | ICD-10-CM | POA: Diagnosis not present

## 2017-01-25 DIAGNOSIS — Z992 Dependence on renal dialysis: Secondary | ICD-10-CM | POA: Diagnosis not present

## 2017-01-25 DIAGNOSIS — N186 End stage renal disease: Secondary | ICD-10-CM | POA: Diagnosis not present

## 2017-01-26 DIAGNOSIS — N2581 Secondary hyperparathyroidism of renal origin: Secondary | ICD-10-CM | POA: Diagnosis not present

## 2017-01-26 DIAGNOSIS — R197 Diarrhea, unspecified: Secondary | ICD-10-CM | POA: Diagnosis not present

## 2017-01-26 DIAGNOSIS — E1129 Type 2 diabetes mellitus with other diabetic kidney complication: Secondary | ICD-10-CM | POA: Diagnosis not present

## 2017-01-26 DIAGNOSIS — D689 Coagulation defect, unspecified: Secondary | ICD-10-CM | POA: Diagnosis not present

## 2017-01-26 DIAGNOSIS — N186 End stage renal disease: Secondary | ICD-10-CM | POA: Diagnosis not present

## 2017-01-26 DIAGNOSIS — D631 Anemia in chronic kidney disease: Secondary | ICD-10-CM | POA: Diagnosis not present

## 2017-01-28 DIAGNOSIS — D631 Anemia in chronic kidney disease: Secondary | ICD-10-CM | POA: Diagnosis not present

## 2017-01-28 DIAGNOSIS — N186 End stage renal disease: Secondary | ICD-10-CM | POA: Diagnosis not present

## 2017-01-28 DIAGNOSIS — D689 Coagulation defect, unspecified: Secondary | ICD-10-CM | POA: Diagnosis not present

## 2017-01-28 DIAGNOSIS — N2581 Secondary hyperparathyroidism of renal origin: Secondary | ICD-10-CM | POA: Diagnosis not present

## 2017-01-28 DIAGNOSIS — E1129 Type 2 diabetes mellitus with other diabetic kidney complication: Secondary | ICD-10-CM | POA: Diagnosis not present

## 2017-01-28 DIAGNOSIS — R197 Diarrhea, unspecified: Secondary | ICD-10-CM | POA: Diagnosis not present

## 2017-01-31 DIAGNOSIS — D689 Coagulation defect, unspecified: Secondary | ICD-10-CM | POA: Diagnosis not present

## 2017-01-31 DIAGNOSIS — N2581 Secondary hyperparathyroidism of renal origin: Secondary | ICD-10-CM | POA: Diagnosis not present

## 2017-01-31 DIAGNOSIS — E1129 Type 2 diabetes mellitus with other diabetic kidney complication: Secondary | ICD-10-CM | POA: Diagnosis not present

## 2017-01-31 DIAGNOSIS — R197 Diarrhea, unspecified: Secondary | ICD-10-CM | POA: Diagnosis not present

## 2017-01-31 DIAGNOSIS — D631 Anemia in chronic kidney disease: Secondary | ICD-10-CM | POA: Diagnosis not present

## 2017-01-31 DIAGNOSIS — N186 End stage renal disease: Secondary | ICD-10-CM | POA: Diagnosis not present

## 2017-02-02 DIAGNOSIS — N2581 Secondary hyperparathyroidism of renal origin: Secondary | ICD-10-CM | POA: Diagnosis not present

## 2017-02-02 DIAGNOSIS — N186 End stage renal disease: Secondary | ICD-10-CM | POA: Diagnosis not present

## 2017-02-02 DIAGNOSIS — D689 Coagulation defect, unspecified: Secondary | ICD-10-CM | POA: Diagnosis not present

## 2017-02-02 DIAGNOSIS — D631 Anemia in chronic kidney disease: Secondary | ICD-10-CM | POA: Diagnosis not present

## 2017-02-02 DIAGNOSIS — R197 Diarrhea, unspecified: Secondary | ICD-10-CM | POA: Diagnosis not present

## 2017-02-02 DIAGNOSIS — E1129 Type 2 diabetes mellitus with other diabetic kidney complication: Secondary | ICD-10-CM | POA: Diagnosis not present

## 2017-02-04 DIAGNOSIS — D689 Coagulation defect, unspecified: Secondary | ICD-10-CM | POA: Diagnosis not present

## 2017-02-04 DIAGNOSIS — R197 Diarrhea, unspecified: Secondary | ICD-10-CM | POA: Diagnosis not present

## 2017-02-04 DIAGNOSIS — N2581 Secondary hyperparathyroidism of renal origin: Secondary | ICD-10-CM | POA: Diagnosis not present

## 2017-02-04 DIAGNOSIS — D631 Anemia in chronic kidney disease: Secondary | ICD-10-CM | POA: Diagnosis not present

## 2017-02-04 DIAGNOSIS — N186 End stage renal disease: Secondary | ICD-10-CM | POA: Diagnosis not present

## 2017-02-04 DIAGNOSIS — E1129 Type 2 diabetes mellitus with other diabetic kidney complication: Secondary | ICD-10-CM | POA: Diagnosis not present

## 2017-02-07 DIAGNOSIS — R197 Diarrhea, unspecified: Secondary | ICD-10-CM | POA: Diagnosis not present

## 2017-02-07 DIAGNOSIS — D689 Coagulation defect, unspecified: Secondary | ICD-10-CM | POA: Diagnosis not present

## 2017-02-07 DIAGNOSIS — D631 Anemia in chronic kidney disease: Secondary | ICD-10-CM | POA: Diagnosis not present

## 2017-02-07 DIAGNOSIS — E1129 Type 2 diabetes mellitus with other diabetic kidney complication: Secondary | ICD-10-CM | POA: Diagnosis not present

## 2017-02-07 DIAGNOSIS — N186 End stage renal disease: Secondary | ICD-10-CM | POA: Diagnosis not present

## 2017-02-07 DIAGNOSIS — N2581 Secondary hyperparathyroidism of renal origin: Secondary | ICD-10-CM | POA: Diagnosis not present

## 2017-02-09 DIAGNOSIS — D689 Coagulation defect, unspecified: Secondary | ICD-10-CM | POA: Diagnosis not present

## 2017-02-09 DIAGNOSIS — R197 Diarrhea, unspecified: Secondary | ICD-10-CM | POA: Diagnosis not present

## 2017-02-09 DIAGNOSIS — N186 End stage renal disease: Secondary | ICD-10-CM | POA: Diagnosis not present

## 2017-02-09 DIAGNOSIS — E1129 Type 2 diabetes mellitus with other diabetic kidney complication: Secondary | ICD-10-CM | POA: Diagnosis not present

## 2017-02-09 DIAGNOSIS — E113311 Type 2 diabetes mellitus with moderate nonproliferative diabetic retinopathy with macular edema, right eye: Secondary | ICD-10-CM | POA: Diagnosis not present

## 2017-02-09 DIAGNOSIS — N2581 Secondary hyperparathyroidism of renal origin: Secondary | ICD-10-CM | POA: Diagnosis not present

## 2017-02-09 DIAGNOSIS — D631 Anemia in chronic kidney disease: Secondary | ICD-10-CM | POA: Diagnosis not present

## 2017-02-11 DIAGNOSIS — E1129 Type 2 diabetes mellitus with other diabetic kidney complication: Secondary | ICD-10-CM | POA: Diagnosis not present

## 2017-02-11 DIAGNOSIS — N2581 Secondary hyperparathyroidism of renal origin: Secondary | ICD-10-CM | POA: Diagnosis not present

## 2017-02-11 DIAGNOSIS — D689 Coagulation defect, unspecified: Secondary | ICD-10-CM | POA: Diagnosis not present

## 2017-02-11 DIAGNOSIS — D631 Anemia in chronic kidney disease: Secondary | ICD-10-CM | POA: Diagnosis not present

## 2017-02-11 DIAGNOSIS — N186 End stage renal disease: Secondary | ICD-10-CM | POA: Diagnosis not present

## 2017-02-11 DIAGNOSIS — R197 Diarrhea, unspecified: Secondary | ICD-10-CM | POA: Diagnosis not present

## 2017-02-14 DIAGNOSIS — E1129 Type 2 diabetes mellitus with other diabetic kidney complication: Secondary | ICD-10-CM | POA: Diagnosis not present

## 2017-02-14 DIAGNOSIS — D631 Anemia in chronic kidney disease: Secondary | ICD-10-CM | POA: Diagnosis not present

## 2017-02-14 DIAGNOSIS — N2581 Secondary hyperparathyroidism of renal origin: Secondary | ICD-10-CM | POA: Diagnosis not present

## 2017-02-14 DIAGNOSIS — N186 End stage renal disease: Secondary | ICD-10-CM | POA: Diagnosis not present

## 2017-02-14 DIAGNOSIS — D689 Coagulation defect, unspecified: Secondary | ICD-10-CM | POA: Diagnosis not present

## 2017-02-14 DIAGNOSIS — R197 Diarrhea, unspecified: Secondary | ICD-10-CM | POA: Diagnosis not present

## 2017-02-16 DIAGNOSIS — R197 Diarrhea, unspecified: Secondary | ICD-10-CM | POA: Diagnosis not present

## 2017-02-16 DIAGNOSIS — N2581 Secondary hyperparathyroidism of renal origin: Secondary | ICD-10-CM | POA: Diagnosis not present

## 2017-02-16 DIAGNOSIS — N186 End stage renal disease: Secondary | ICD-10-CM | POA: Diagnosis not present

## 2017-02-16 DIAGNOSIS — D689 Coagulation defect, unspecified: Secondary | ICD-10-CM | POA: Diagnosis not present

## 2017-02-16 DIAGNOSIS — D631 Anemia in chronic kidney disease: Secondary | ICD-10-CM | POA: Diagnosis not present

## 2017-02-16 DIAGNOSIS — E1129 Type 2 diabetes mellitus with other diabetic kidney complication: Secondary | ICD-10-CM | POA: Diagnosis not present

## 2017-02-18 DIAGNOSIS — N186 End stage renal disease: Secondary | ICD-10-CM | POA: Diagnosis not present

## 2017-02-18 DIAGNOSIS — D631 Anemia in chronic kidney disease: Secondary | ICD-10-CM | POA: Diagnosis not present

## 2017-02-18 DIAGNOSIS — R197 Diarrhea, unspecified: Secondary | ICD-10-CM | POA: Diagnosis not present

## 2017-02-18 DIAGNOSIS — D689 Coagulation defect, unspecified: Secondary | ICD-10-CM | POA: Diagnosis not present

## 2017-02-18 DIAGNOSIS — N2581 Secondary hyperparathyroidism of renal origin: Secondary | ICD-10-CM | POA: Diagnosis not present

## 2017-02-18 DIAGNOSIS — E1129 Type 2 diabetes mellitus with other diabetic kidney complication: Secondary | ICD-10-CM | POA: Diagnosis not present

## 2017-02-21 DIAGNOSIS — E1129 Type 2 diabetes mellitus with other diabetic kidney complication: Secondary | ICD-10-CM | POA: Diagnosis not present

## 2017-02-21 DIAGNOSIS — R197 Diarrhea, unspecified: Secondary | ICD-10-CM | POA: Diagnosis not present

## 2017-02-21 DIAGNOSIS — D631 Anemia in chronic kidney disease: Secondary | ICD-10-CM | POA: Diagnosis not present

## 2017-02-21 DIAGNOSIS — N186 End stage renal disease: Secondary | ICD-10-CM | POA: Diagnosis not present

## 2017-02-21 DIAGNOSIS — D689 Coagulation defect, unspecified: Secondary | ICD-10-CM | POA: Diagnosis not present

## 2017-02-21 DIAGNOSIS — N2581 Secondary hyperparathyroidism of renal origin: Secondary | ICD-10-CM | POA: Diagnosis not present

## 2017-02-23 DIAGNOSIS — R197 Diarrhea, unspecified: Secondary | ICD-10-CM | POA: Diagnosis not present

## 2017-02-23 DIAGNOSIS — E1129 Type 2 diabetes mellitus with other diabetic kidney complication: Secondary | ICD-10-CM | POA: Diagnosis not present

## 2017-02-23 DIAGNOSIS — D689 Coagulation defect, unspecified: Secondary | ICD-10-CM | POA: Diagnosis not present

## 2017-02-23 DIAGNOSIS — N186 End stage renal disease: Secondary | ICD-10-CM | POA: Diagnosis not present

## 2017-02-23 DIAGNOSIS — N2581 Secondary hyperparathyroidism of renal origin: Secondary | ICD-10-CM | POA: Diagnosis not present

## 2017-02-23 DIAGNOSIS — D631 Anemia in chronic kidney disease: Secondary | ICD-10-CM | POA: Diagnosis not present

## 2017-02-24 DIAGNOSIS — Z9889 Other specified postprocedural states: Secondary | ICD-10-CM | POA: Diagnosis not present

## 2017-02-24 DIAGNOSIS — E1129 Type 2 diabetes mellitus with other diabetic kidney complication: Secondary | ICD-10-CM | POA: Diagnosis not present

## 2017-02-24 DIAGNOSIS — Z452 Encounter for adjustment and management of vascular access device: Secondary | ICD-10-CM | POA: Diagnosis not present

## 2017-02-24 DIAGNOSIS — Z992 Dependence on renal dialysis: Secondary | ICD-10-CM | POA: Diagnosis not present

## 2017-02-24 DIAGNOSIS — N186 End stage renal disease: Secondary | ICD-10-CM | POA: Diagnosis not present

## 2017-02-25 DIAGNOSIS — L299 Pruritus, unspecified: Secondary | ICD-10-CM | POA: Diagnosis not present

## 2017-02-25 DIAGNOSIS — R52 Pain, unspecified: Secondary | ICD-10-CM | POA: Diagnosis not present

## 2017-02-25 DIAGNOSIS — N186 End stage renal disease: Secondary | ICD-10-CM | POA: Diagnosis not present

## 2017-02-25 DIAGNOSIS — E1129 Type 2 diabetes mellitus with other diabetic kidney complication: Secondary | ICD-10-CM | POA: Diagnosis not present

## 2017-02-25 DIAGNOSIS — Z23 Encounter for immunization: Secondary | ICD-10-CM | POA: Diagnosis not present

## 2017-02-25 DIAGNOSIS — N2581 Secondary hyperparathyroidism of renal origin: Secondary | ICD-10-CM | POA: Diagnosis not present

## 2017-02-25 DIAGNOSIS — D631 Anemia in chronic kidney disease: Secondary | ICD-10-CM | POA: Diagnosis not present

## 2017-02-28 DIAGNOSIS — E1129 Type 2 diabetes mellitus with other diabetic kidney complication: Secondary | ICD-10-CM | POA: Diagnosis not present

## 2017-02-28 DIAGNOSIS — N2581 Secondary hyperparathyroidism of renal origin: Secondary | ICD-10-CM | POA: Diagnosis not present

## 2017-02-28 DIAGNOSIS — L299 Pruritus, unspecified: Secondary | ICD-10-CM | POA: Diagnosis not present

## 2017-02-28 DIAGNOSIS — R52 Pain, unspecified: Secondary | ICD-10-CM | POA: Diagnosis not present

## 2017-02-28 DIAGNOSIS — N186 End stage renal disease: Secondary | ICD-10-CM | POA: Diagnosis not present

## 2017-02-28 DIAGNOSIS — Z23 Encounter for immunization: Secondary | ICD-10-CM | POA: Diagnosis not present

## 2017-02-28 DIAGNOSIS — D631 Anemia in chronic kidney disease: Secondary | ICD-10-CM | POA: Diagnosis not present

## 2017-03-02 DIAGNOSIS — E1129 Type 2 diabetes mellitus with other diabetic kidney complication: Secondary | ICD-10-CM | POA: Diagnosis not present

## 2017-03-02 DIAGNOSIS — N186 End stage renal disease: Secondary | ICD-10-CM | POA: Diagnosis not present

## 2017-03-02 DIAGNOSIS — D631 Anemia in chronic kidney disease: Secondary | ICD-10-CM | POA: Diagnosis not present

## 2017-03-02 DIAGNOSIS — R52 Pain, unspecified: Secondary | ICD-10-CM | POA: Diagnosis not present

## 2017-03-02 DIAGNOSIS — N2581 Secondary hyperparathyroidism of renal origin: Secondary | ICD-10-CM | POA: Diagnosis not present

## 2017-03-02 DIAGNOSIS — Z23 Encounter for immunization: Secondary | ICD-10-CM | POA: Diagnosis not present

## 2017-03-02 DIAGNOSIS — L299 Pruritus, unspecified: Secondary | ICD-10-CM | POA: Diagnosis not present

## 2017-03-04 DIAGNOSIS — L299 Pruritus, unspecified: Secondary | ICD-10-CM | POA: Diagnosis not present

## 2017-03-04 DIAGNOSIS — Z23 Encounter for immunization: Secondary | ICD-10-CM | POA: Diagnosis not present

## 2017-03-04 DIAGNOSIS — R52 Pain, unspecified: Secondary | ICD-10-CM | POA: Diagnosis not present

## 2017-03-04 DIAGNOSIS — N186 End stage renal disease: Secondary | ICD-10-CM | POA: Diagnosis not present

## 2017-03-04 DIAGNOSIS — N2581 Secondary hyperparathyroidism of renal origin: Secondary | ICD-10-CM | POA: Diagnosis not present

## 2017-03-04 DIAGNOSIS — D631 Anemia in chronic kidney disease: Secondary | ICD-10-CM | POA: Diagnosis not present

## 2017-03-04 DIAGNOSIS — E1129 Type 2 diabetes mellitus with other diabetic kidney complication: Secondary | ICD-10-CM | POA: Diagnosis not present

## 2017-03-07 DIAGNOSIS — E1129 Type 2 diabetes mellitus with other diabetic kidney complication: Secondary | ICD-10-CM | POA: Diagnosis not present

## 2017-03-07 DIAGNOSIS — N2581 Secondary hyperparathyroidism of renal origin: Secondary | ICD-10-CM | POA: Diagnosis not present

## 2017-03-07 DIAGNOSIS — L299 Pruritus, unspecified: Secondary | ICD-10-CM | POA: Diagnosis not present

## 2017-03-07 DIAGNOSIS — N186 End stage renal disease: Secondary | ICD-10-CM | POA: Diagnosis not present

## 2017-03-07 DIAGNOSIS — R52 Pain, unspecified: Secondary | ICD-10-CM | POA: Diagnosis not present

## 2017-03-07 DIAGNOSIS — D631 Anemia in chronic kidney disease: Secondary | ICD-10-CM | POA: Diagnosis not present

## 2017-03-07 DIAGNOSIS — Z23 Encounter for immunization: Secondary | ICD-10-CM | POA: Diagnosis not present

## 2017-03-09 DIAGNOSIS — E1129 Type 2 diabetes mellitus with other diabetic kidney complication: Secondary | ICD-10-CM | POA: Diagnosis not present

## 2017-03-09 DIAGNOSIS — N186 End stage renal disease: Secondary | ICD-10-CM | POA: Diagnosis not present

## 2017-03-09 DIAGNOSIS — Z23 Encounter for immunization: Secondary | ICD-10-CM | POA: Diagnosis not present

## 2017-03-09 DIAGNOSIS — L299 Pruritus, unspecified: Secondary | ICD-10-CM | POA: Diagnosis not present

## 2017-03-09 DIAGNOSIS — R52 Pain, unspecified: Secondary | ICD-10-CM | POA: Diagnosis not present

## 2017-03-09 DIAGNOSIS — D631 Anemia in chronic kidney disease: Secondary | ICD-10-CM | POA: Diagnosis not present

## 2017-03-09 DIAGNOSIS — N2581 Secondary hyperparathyroidism of renal origin: Secondary | ICD-10-CM | POA: Diagnosis not present

## 2017-03-11 DIAGNOSIS — N2581 Secondary hyperparathyroidism of renal origin: Secondary | ICD-10-CM | POA: Diagnosis not present

## 2017-03-11 DIAGNOSIS — D631 Anemia in chronic kidney disease: Secondary | ICD-10-CM | POA: Diagnosis not present

## 2017-03-11 DIAGNOSIS — Z23 Encounter for immunization: Secondary | ICD-10-CM | POA: Diagnosis not present

## 2017-03-11 DIAGNOSIS — E1129 Type 2 diabetes mellitus with other diabetic kidney complication: Secondary | ICD-10-CM | POA: Diagnosis not present

## 2017-03-11 DIAGNOSIS — L299 Pruritus, unspecified: Secondary | ICD-10-CM | POA: Diagnosis not present

## 2017-03-11 DIAGNOSIS — R52 Pain, unspecified: Secondary | ICD-10-CM | POA: Diagnosis not present

## 2017-03-11 DIAGNOSIS — N186 End stage renal disease: Secondary | ICD-10-CM | POA: Diagnosis not present

## 2017-03-13 DIAGNOSIS — E119 Type 2 diabetes mellitus without complications: Secondary | ICD-10-CM | POA: Diagnosis not present

## 2017-03-13 DIAGNOSIS — N289 Disorder of kidney and ureter, unspecified: Secondary | ICD-10-CM | POA: Diagnosis not present

## 2017-03-14 DIAGNOSIS — L299 Pruritus, unspecified: Secondary | ICD-10-CM | POA: Diagnosis not present

## 2017-03-14 DIAGNOSIS — N2581 Secondary hyperparathyroidism of renal origin: Secondary | ICD-10-CM | POA: Diagnosis not present

## 2017-03-14 DIAGNOSIS — R52 Pain, unspecified: Secondary | ICD-10-CM | POA: Diagnosis not present

## 2017-03-14 DIAGNOSIS — D631 Anemia in chronic kidney disease: Secondary | ICD-10-CM | POA: Diagnosis not present

## 2017-03-14 DIAGNOSIS — N186 End stage renal disease: Secondary | ICD-10-CM | POA: Diagnosis not present

## 2017-03-14 DIAGNOSIS — E1129 Type 2 diabetes mellitus with other diabetic kidney complication: Secondary | ICD-10-CM | POA: Diagnosis not present

## 2017-03-14 DIAGNOSIS — Z23 Encounter for immunization: Secondary | ICD-10-CM | POA: Diagnosis not present

## 2017-03-16 DIAGNOSIS — L299 Pruritus, unspecified: Secondary | ICD-10-CM | POA: Diagnosis not present

## 2017-03-16 DIAGNOSIS — D631 Anemia in chronic kidney disease: Secondary | ICD-10-CM | POA: Diagnosis not present

## 2017-03-16 DIAGNOSIS — E1129 Type 2 diabetes mellitus with other diabetic kidney complication: Secondary | ICD-10-CM | POA: Diagnosis not present

## 2017-03-16 DIAGNOSIS — Z23 Encounter for immunization: Secondary | ICD-10-CM | POA: Diagnosis not present

## 2017-03-16 DIAGNOSIS — R52 Pain, unspecified: Secondary | ICD-10-CM | POA: Diagnosis not present

## 2017-03-16 DIAGNOSIS — N2581 Secondary hyperparathyroidism of renal origin: Secondary | ICD-10-CM | POA: Diagnosis not present

## 2017-03-16 DIAGNOSIS — N186 End stage renal disease: Secondary | ICD-10-CM | POA: Diagnosis not present

## 2017-03-18 DIAGNOSIS — E1129 Type 2 diabetes mellitus with other diabetic kidney complication: Secondary | ICD-10-CM | POA: Diagnosis not present

## 2017-03-18 DIAGNOSIS — D631 Anemia in chronic kidney disease: Secondary | ICD-10-CM | POA: Diagnosis not present

## 2017-03-18 DIAGNOSIS — R52 Pain, unspecified: Secondary | ICD-10-CM | POA: Diagnosis not present

## 2017-03-18 DIAGNOSIS — N186 End stage renal disease: Secondary | ICD-10-CM | POA: Diagnosis not present

## 2017-03-18 DIAGNOSIS — L299 Pruritus, unspecified: Secondary | ICD-10-CM | POA: Diagnosis not present

## 2017-03-18 DIAGNOSIS — N2581 Secondary hyperparathyroidism of renal origin: Secondary | ICD-10-CM | POA: Diagnosis not present

## 2017-03-18 DIAGNOSIS — Z23 Encounter for immunization: Secondary | ICD-10-CM | POA: Diagnosis not present

## 2017-03-20 DIAGNOSIS — N186 End stage renal disease: Secondary | ICD-10-CM | POA: Diagnosis not present

## 2017-03-20 DIAGNOSIS — N2581 Secondary hyperparathyroidism of renal origin: Secondary | ICD-10-CM | POA: Diagnosis not present

## 2017-03-20 DIAGNOSIS — E1129 Type 2 diabetes mellitus with other diabetic kidney complication: Secondary | ICD-10-CM | POA: Diagnosis not present

## 2017-03-20 DIAGNOSIS — Z23 Encounter for immunization: Secondary | ICD-10-CM | POA: Diagnosis not present

## 2017-03-20 DIAGNOSIS — L299 Pruritus, unspecified: Secondary | ICD-10-CM | POA: Diagnosis not present

## 2017-03-20 DIAGNOSIS — D631 Anemia in chronic kidney disease: Secondary | ICD-10-CM | POA: Diagnosis not present

## 2017-03-20 DIAGNOSIS — R52 Pain, unspecified: Secondary | ICD-10-CM | POA: Diagnosis not present

## 2017-03-23 DIAGNOSIS — N186 End stage renal disease: Secondary | ICD-10-CM | POA: Diagnosis not present

## 2017-03-23 DIAGNOSIS — R52 Pain, unspecified: Secondary | ICD-10-CM | POA: Diagnosis not present

## 2017-03-23 DIAGNOSIS — E1129 Type 2 diabetes mellitus with other diabetic kidney complication: Secondary | ICD-10-CM | POA: Diagnosis not present

## 2017-03-23 DIAGNOSIS — L299 Pruritus, unspecified: Secondary | ICD-10-CM | POA: Diagnosis not present

## 2017-03-23 DIAGNOSIS — Z23 Encounter for immunization: Secondary | ICD-10-CM | POA: Diagnosis not present

## 2017-03-23 DIAGNOSIS — N2581 Secondary hyperparathyroidism of renal origin: Secondary | ICD-10-CM | POA: Diagnosis not present

## 2017-03-23 DIAGNOSIS — D631 Anemia in chronic kidney disease: Secondary | ICD-10-CM | POA: Diagnosis not present

## 2017-03-25 DIAGNOSIS — R52 Pain, unspecified: Secondary | ICD-10-CM | POA: Diagnosis not present

## 2017-03-25 DIAGNOSIS — N186 End stage renal disease: Secondary | ICD-10-CM | POA: Diagnosis not present

## 2017-03-25 DIAGNOSIS — L299 Pruritus, unspecified: Secondary | ICD-10-CM | POA: Diagnosis not present

## 2017-03-25 DIAGNOSIS — Z23 Encounter for immunization: Secondary | ICD-10-CM | POA: Diagnosis not present

## 2017-03-25 DIAGNOSIS — E1129 Type 2 diabetes mellitus with other diabetic kidney complication: Secondary | ICD-10-CM | POA: Diagnosis not present

## 2017-03-25 DIAGNOSIS — N2581 Secondary hyperparathyroidism of renal origin: Secondary | ICD-10-CM | POA: Diagnosis not present

## 2017-03-25 DIAGNOSIS — D631 Anemia in chronic kidney disease: Secondary | ICD-10-CM | POA: Diagnosis not present

## 2017-03-27 DIAGNOSIS — Z23 Encounter for immunization: Secondary | ICD-10-CM | POA: Diagnosis not present

## 2017-03-27 DIAGNOSIS — E1129 Type 2 diabetes mellitus with other diabetic kidney complication: Secondary | ICD-10-CM | POA: Diagnosis not present

## 2017-03-27 DIAGNOSIS — N186 End stage renal disease: Secondary | ICD-10-CM | POA: Diagnosis not present

## 2017-03-27 DIAGNOSIS — Z992 Dependence on renal dialysis: Secondary | ICD-10-CM | POA: Diagnosis not present

## 2017-03-27 DIAGNOSIS — L299 Pruritus, unspecified: Secondary | ICD-10-CM | POA: Diagnosis not present

## 2017-03-27 DIAGNOSIS — N2581 Secondary hyperparathyroidism of renal origin: Secondary | ICD-10-CM | POA: Diagnosis not present

## 2017-03-27 DIAGNOSIS — R52 Pain, unspecified: Secondary | ICD-10-CM | POA: Diagnosis not present

## 2017-03-27 DIAGNOSIS — D631 Anemia in chronic kidney disease: Secondary | ICD-10-CM | POA: Diagnosis not present

## 2017-03-30 DIAGNOSIS — N186 End stage renal disease: Secondary | ICD-10-CM | POA: Diagnosis not present

## 2017-03-30 DIAGNOSIS — N2581 Secondary hyperparathyroidism of renal origin: Secondary | ICD-10-CM | POA: Diagnosis not present

## 2017-03-31 DIAGNOSIS — Z720 Tobacco use: Secondary | ICD-10-CM | POA: Diagnosis not present

## 2017-03-31 DIAGNOSIS — E1129 Type 2 diabetes mellitus with other diabetic kidney complication: Secondary | ICD-10-CM | POA: Diagnosis not present

## 2017-03-31 DIAGNOSIS — I129 Hypertensive chronic kidney disease with stage 1 through stage 4 chronic kidney disease, or unspecified chronic kidney disease: Secondary | ICD-10-CM | POA: Diagnosis not present

## 2017-03-31 DIAGNOSIS — E785 Hyperlipidemia, unspecified: Secondary | ICD-10-CM | POA: Diagnosis not present

## 2017-03-31 DIAGNOSIS — G2581 Restless legs syndrome: Secondary | ICD-10-CM | POA: Diagnosis not present

## 2017-03-31 DIAGNOSIS — E039 Hypothyroidism, unspecified: Secondary | ICD-10-CM | POA: Diagnosis not present

## 2017-04-01 DIAGNOSIS — N186 End stage renal disease: Secondary | ICD-10-CM | POA: Diagnosis not present

## 2017-04-01 DIAGNOSIS — N2581 Secondary hyperparathyroidism of renal origin: Secondary | ICD-10-CM | POA: Diagnosis not present

## 2017-04-04 DIAGNOSIS — N2581 Secondary hyperparathyroidism of renal origin: Secondary | ICD-10-CM | POA: Diagnosis not present

## 2017-04-04 DIAGNOSIS — N186 End stage renal disease: Secondary | ICD-10-CM | POA: Diagnosis not present

## 2017-04-05 DIAGNOSIS — N186 End stage renal disease: Secondary | ICD-10-CM | POA: Diagnosis not present

## 2017-04-05 DIAGNOSIS — N2581 Secondary hyperparathyroidism of renal origin: Secondary | ICD-10-CM | POA: Diagnosis not present

## 2017-04-07 DIAGNOSIS — D127 Benign neoplasm of rectosigmoid junction: Secondary | ICD-10-CM | POA: Diagnosis not present

## 2017-04-07 DIAGNOSIS — K5730 Diverticulosis of large intestine without perforation or abscess without bleeding: Secondary | ICD-10-CM | POA: Diagnosis not present

## 2017-04-07 DIAGNOSIS — F329 Major depressive disorder, single episode, unspecified: Secondary | ICD-10-CM | POA: Diagnosis not present

## 2017-04-07 DIAGNOSIS — J449 Chronic obstructive pulmonary disease, unspecified: Secondary | ICD-10-CM | POA: Diagnosis not present

## 2017-04-07 DIAGNOSIS — D124 Benign neoplasm of descending colon: Secondary | ICD-10-CM | POA: Diagnosis not present

## 2017-04-07 DIAGNOSIS — E039 Hypothyroidism, unspecified: Secondary | ICD-10-CM | POA: Diagnosis not present

## 2017-04-07 DIAGNOSIS — D126 Benign neoplasm of colon, unspecified: Secondary | ICD-10-CM | POA: Diagnosis not present

## 2017-04-07 DIAGNOSIS — E119 Type 2 diabetes mellitus without complications: Secondary | ICD-10-CM | POA: Diagnosis not present

## 2017-04-07 DIAGNOSIS — D125 Benign neoplasm of sigmoid colon: Secondary | ICD-10-CM | POA: Diagnosis not present

## 2017-04-07 DIAGNOSIS — K644 Residual hemorrhoidal skin tags: Secondary | ICD-10-CM | POA: Diagnosis not present

## 2017-04-07 DIAGNOSIS — Z1211 Encounter for screening for malignant neoplasm of colon: Secondary | ICD-10-CM | POA: Diagnosis not present

## 2017-04-07 DIAGNOSIS — K621 Rectal polyp: Secondary | ICD-10-CM | POA: Diagnosis not present

## 2017-04-08 DIAGNOSIS — N2581 Secondary hyperparathyroidism of renal origin: Secondary | ICD-10-CM | POA: Diagnosis not present

## 2017-04-08 DIAGNOSIS — N186 End stage renal disease: Secondary | ICD-10-CM | POA: Diagnosis not present

## 2017-04-12 DIAGNOSIS — N2581 Secondary hyperparathyroidism of renal origin: Secondary | ICD-10-CM | POA: Diagnosis not present

## 2017-04-12 DIAGNOSIS — N186 End stage renal disease: Secondary | ICD-10-CM | POA: Diagnosis not present

## 2017-04-13 DIAGNOSIS — N2581 Secondary hyperparathyroidism of renal origin: Secondary | ICD-10-CM | POA: Diagnosis not present

## 2017-04-13 DIAGNOSIS — N186 End stage renal disease: Secondary | ICD-10-CM | POA: Diagnosis not present

## 2017-04-15 DIAGNOSIS — N186 End stage renal disease: Secondary | ICD-10-CM | POA: Diagnosis not present

## 2017-04-15 DIAGNOSIS — N2581 Secondary hyperparathyroidism of renal origin: Secondary | ICD-10-CM | POA: Diagnosis not present

## 2017-04-19 DIAGNOSIS — N186 End stage renal disease: Secondary | ICD-10-CM | POA: Diagnosis not present

## 2017-04-19 DIAGNOSIS — N2581 Secondary hyperparathyroidism of renal origin: Secondary | ICD-10-CM | POA: Diagnosis not present

## 2017-04-20 DIAGNOSIS — N186 End stage renal disease: Secondary | ICD-10-CM | POA: Diagnosis not present

## 2017-04-20 DIAGNOSIS — N2581 Secondary hyperparathyroidism of renal origin: Secondary | ICD-10-CM | POA: Diagnosis not present

## 2017-04-22 DIAGNOSIS — N186 End stage renal disease: Secondary | ICD-10-CM | POA: Diagnosis not present

## 2017-04-22 DIAGNOSIS — N2581 Secondary hyperparathyroidism of renal origin: Secondary | ICD-10-CM | POA: Diagnosis not present

## 2017-04-25 DIAGNOSIS — N186 End stage renal disease: Secondary | ICD-10-CM | POA: Diagnosis not present

## 2017-04-25 DIAGNOSIS — N2581 Secondary hyperparathyroidism of renal origin: Secondary | ICD-10-CM | POA: Diagnosis not present

## 2017-04-27 DIAGNOSIS — E1129 Type 2 diabetes mellitus with other diabetic kidney complication: Secondary | ICD-10-CM | POA: Diagnosis not present

## 2017-04-27 DIAGNOSIS — N186 End stage renal disease: Secondary | ICD-10-CM | POA: Diagnosis not present

## 2017-04-27 DIAGNOSIS — N2581 Secondary hyperparathyroidism of renal origin: Secondary | ICD-10-CM | POA: Diagnosis not present

## 2017-04-27 DIAGNOSIS — Z992 Dependence on renal dialysis: Secondary | ICD-10-CM | POA: Diagnosis not present

## 2017-04-28 DIAGNOSIS — E1129 Type 2 diabetes mellitus with other diabetic kidney complication: Secondary | ICD-10-CM | POA: Diagnosis not present

## 2017-04-28 DIAGNOSIS — N186 End stage renal disease: Secondary | ICD-10-CM | POA: Diagnosis not present

## 2017-04-28 DIAGNOSIS — Z992 Dependence on renal dialysis: Secondary | ICD-10-CM | POA: Diagnosis not present

## 2017-04-29 DIAGNOSIS — N186 End stage renal disease: Secondary | ICD-10-CM | POA: Diagnosis not present

## 2017-04-29 DIAGNOSIS — N2581 Secondary hyperparathyroidism of renal origin: Secondary | ICD-10-CM | POA: Diagnosis not present

## 2017-05-02 DIAGNOSIS — N186 End stage renal disease: Secondary | ICD-10-CM | POA: Diagnosis not present

## 2017-05-02 DIAGNOSIS — N2581 Secondary hyperparathyroidism of renal origin: Secondary | ICD-10-CM | POA: Diagnosis not present

## 2017-05-04 DIAGNOSIS — N186 End stage renal disease: Secondary | ICD-10-CM | POA: Diagnosis not present

## 2017-05-04 DIAGNOSIS — N2581 Secondary hyperparathyroidism of renal origin: Secondary | ICD-10-CM | POA: Diagnosis not present

## 2017-05-06 DIAGNOSIS — N186 End stage renal disease: Secondary | ICD-10-CM | POA: Diagnosis not present

## 2017-05-06 DIAGNOSIS — N2581 Secondary hyperparathyroidism of renal origin: Secondary | ICD-10-CM | POA: Diagnosis not present

## 2017-05-09 DIAGNOSIS — N2581 Secondary hyperparathyroidism of renal origin: Secondary | ICD-10-CM | POA: Diagnosis not present

## 2017-05-09 DIAGNOSIS — N186 End stage renal disease: Secondary | ICD-10-CM | POA: Diagnosis not present

## 2017-05-11 DIAGNOSIS — N2581 Secondary hyperparathyroidism of renal origin: Secondary | ICD-10-CM | POA: Diagnosis not present

## 2017-05-11 DIAGNOSIS — N186 End stage renal disease: Secondary | ICD-10-CM | POA: Diagnosis not present

## 2017-05-13 DIAGNOSIS — N2581 Secondary hyperparathyroidism of renal origin: Secondary | ICD-10-CM | POA: Diagnosis not present

## 2017-05-13 DIAGNOSIS — N186 End stage renal disease: Secondary | ICD-10-CM | POA: Diagnosis not present

## 2017-05-16 DIAGNOSIS — N2581 Secondary hyperparathyroidism of renal origin: Secondary | ICD-10-CM | POA: Diagnosis not present

## 2017-05-16 DIAGNOSIS — N186 End stage renal disease: Secondary | ICD-10-CM | POA: Diagnosis not present

## 2017-05-18 DIAGNOSIS — N2581 Secondary hyperparathyroidism of renal origin: Secondary | ICD-10-CM | POA: Diagnosis not present

## 2017-05-18 DIAGNOSIS — E113312 Type 2 diabetes mellitus with moderate nonproliferative diabetic retinopathy with macular edema, left eye: Secondary | ICD-10-CM | POA: Diagnosis not present

## 2017-05-18 DIAGNOSIS — N186 End stage renal disease: Secondary | ICD-10-CM | POA: Diagnosis not present

## 2017-05-20 DIAGNOSIS — N2581 Secondary hyperparathyroidism of renal origin: Secondary | ICD-10-CM | POA: Diagnosis not present

## 2017-05-20 DIAGNOSIS — N186 End stage renal disease: Secondary | ICD-10-CM | POA: Diagnosis not present

## 2017-05-23 DIAGNOSIS — N186 End stage renal disease: Secondary | ICD-10-CM | POA: Diagnosis not present

## 2017-05-23 DIAGNOSIS — N2581 Secondary hyperparathyroidism of renal origin: Secondary | ICD-10-CM | POA: Diagnosis not present

## 2017-05-25 DIAGNOSIS — N186 End stage renal disease: Secondary | ICD-10-CM | POA: Diagnosis not present

## 2017-05-25 DIAGNOSIS — N2581 Secondary hyperparathyroidism of renal origin: Secondary | ICD-10-CM | POA: Diagnosis not present

## 2017-05-26 DIAGNOSIS — E1129 Type 2 diabetes mellitus with other diabetic kidney complication: Secondary | ICD-10-CM | POA: Diagnosis not present

## 2017-05-26 DIAGNOSIS — Z992 Dependence on renal dialysis: Secondary | ICD-10-CM | POA: Diagnosis not present

## 2017-05-26 DIAGNOSIS — N186 End stage renal disease: Secondary | ICD-10-CM | POA: Diagnosis not present

## 2017-05-27 DIAGNOSIS — N186 End stage renal disease: Secondary | ICD-10-CM | POA: Diagnosis not present

## 2017-05-27 DIAGNOSIS — N2581 Secondary hyperparathyroidism of renal origin: Secondary | ICD-10-CM | POA: Diagnosis not present

## 2017-05-30 DIAGNOSIS — N186 End stage renal disease: Secondary | ICD-10-CM | POA: Diagnosis not present

## 2017-05-30 DIAGNOSIS — N2581 Secondary hyperparathyroidism of renal origin: Secondary | ICD-10-CM | POA: Diagnosis not present

## 2017-06-01 DIAGNOSIS — N186 End stage renal disease: Secondary | ICD-10-CM | POA: Diagnosis not present

## 2017-06-01 DIAGNOSIS — N2581 Secondary hyperparathyroidism of renal origin: Secondary | ICD-10-CM | POA: Diagnosis not present

## 2017-06-03 DIAGNOSIS — N2581 Secondary hyperparathyroidism of renal origin: Secondary | ICD-10-CM | POA: Diagnosis not present

## 2017-06-03 DIAGNOSIS — N186 End stage renal disease: Secondary | ICD-10-CM | POA: Diagnosis not present

## 2017-06-05 DIAGNOSIS — L300 Nummular dermatitis: Secondary | ICD-10-CM | POA: Diagnosis not present

## 2017-06-05 DIAGNOSIS — L299 Pruritus, unspecified: Secondary | ICD-10-CM | POA: Diagnosis not present

## 2017-06-06 DIAGNOSIS — N186 End stage renal disease: Secondary | ICD-10-CM | POA: Diagnosis not present

## 2017-06-06 DIAGNOSIS — N2581 Secondary hyperparathyroidism of renal origin: Secondary | ICD-10-CM | POA: Diagnosis not present

## 2017-06-08 DIAGNOSIS — N2581 Secondary hyperparathyroidism of renal origin: Secondary | ICD-10-CM | POA: Diagnosis not present

## 2017-06-08 DIAGNOSIS — N186 End stage renal disease: Secondary | ICD-10-CM | POA: Diagnosis not present

## 2017-06-10 DIAGNOSIS — N186 End stage renal disease: Secondary | ICD-10-CM | POA: Diagnosis not present

## 2017-06-10 DIAGNOSIS — N2581 Secondary hyperparathyroidism of renal origin: Secondary | ICD-10-CM | POA: Diagnosis not present

## 2017-06-13 DIAGNOSIS — N2581 Secondary hyperparathyroidism of renal origin: Secondary | ICD-10-CM | POA: Diagnosis not present

## 2017-06-13 DIAGNOSIS — N186 End stage renal disease: Secondary | ICD-10-CM | POA: Diagnosis not present

## 2017-06-15 DIAGNOSIS — N186 End stage renal disease: Secondary | ICD-10-CM | POA: Diagnosis not present

## 2017-06-15 DIAGNOSIS — N2581 Secondary hyperparathyroidism of renal origin: Secondary | ICD-10-CM | POA: Diagnosis not present

## 2017-06-17 DIAGNOSIS — N2581 Secondary hyperparathyroidism of renal origin: Secondary | ICD-10-CM | POA: Diagnosis not present

## 2017-06-17 DIAGNOSIS — N186 End stage renal disease: Secondary | ICD-10-CM | POA: Diagnosis not present

## 2017-06-20 DIAGNOSIS — N2581 Secondary hyperparathyroidism of renal origin: Secondary | ICD-10-CM | POA: Diagnosis not present

## 2017-06-20 DIAGNOSIS — N186 End stage renal disease: Secondary | ICD-10-CM | POA: Diagnosis not present

## 2017-06-22 DIAGNOSIS — N2581 Secondary hyperparathyroidism of renal origin: Secondary | ICD-10-CM | POA: Diagnosis not present

## 2017-06-22 DIAGNOSIS — N186 End stage renal disease: Secondary | ICD-10-CM | POA: Diagnosis not present

## 2017-06-24 DIAGNOSIS — N186 End stage renal disease: Secondary | ICD-10-CM | POA: Diagnosis not present

## 2017-06-24 DIAGNOSIS — N2581 Secondary hyperparathyroidism of renal origin: Secondary | ICD-10-CM | POA: Diagnosis not present

## 2017-06-26 DIAGNOSIS — Z992 Dependence on renal dialysis: Secondary | ICD-10-CM | POA: Diagnosis not present

## 2017-06-26 DIAGNOSIS — E1129 Type 2 diabetes mellitus with other diabetic kidney complication: Secondary | ICD-10-CM | POA: Diagnosis not present

## 2017-06-26 DIAGNOSIS — N186 End stage renal disease: Secondary | ICD-10-CM | POA: Diagnosis not present

## 2017-06-27 DIAGNOSIS — N186 End stage renal disease: Secondary | ICD-10-CM | POA: Diagnosis not present

## 2017-06-27 DIAGNOSIS — N2581 Secondary hyperparathyroidism of renal origin: Secondary | ICD-10-CM | POA: Diagnosis not present

## 2017-06-29 DIAGNOSIS — E113392 Type 2 diabetes mellitus with moderate nonproliferative diabetic retinopathy without macular edema, left eye: Secondary | ICD-10-CM | POA: Diagnosis not present

## 2017-06-29 DIAGNOSIS — N2581 Secondary hyperparathyroidism of renal origin: Secondary | ICD-10-CM | POA: Diagnosis not present

## 2017-06-29 DIAGNOSIS — N186 End stage renal disease: Secondary | ICD-10-CM | POA: Diagnosis not present

## 2017-06-30 DIAGNOSIS — Z139 Encounter for screening, unspecified: Secondary | ICD-10-CM | POA: Diagnosis not present

## 2017-06-30 DIAGNOSIS — E785 Hyperlipidemia, unspecified: Secondary | ICD-10-CM | POA: Diagnosis not present

## 2017-06-30 DIAGNOSIS — I129 Hypertensive chronic kidney disease with stage 1 through stage 4 chronic kidney disease, or unspecified chronic kidney disease: Secondary | ICD-10-CM | POA: Diagnosis not present

## 2017-06-30 DIAGNOSIS — Z6827 Body mass index (BMI) 27.0-27.9, adult: Secondary | ICD-10-CM | POA: Diagnosis not present

## 2017-06-30 DIAGNOSIS — Z1389 Encounter for screening for other disorder: Secondary | ICD-10-CM | POA: Diagnosis not present

## 2017-06-30 DIAGNOSIS — Z1331 Encounter for screening for depression: Secondary | ICD-10-CM | POA: Diagnosis not present

## 2017-06-30 DIAGNOSIS — E039 Hypothyroidism, unspecified: Secondary | ICD-10-CM | POA: Diagnosis not present

## 2017-06-30 DIAGNOSIS — E1129 Type 2 diabetes mellitus with other diabetic kidney complication: Secondary | ICD-10-CM | POA: Diagnosis not present

## 2017-06-30 DIAGNOSIS — Z9181 History of falling: Secondary | ICD-10-CM | POA: Diagnosis not present

## 2017-06-30 DIAGNOSIS — G2581 Restless legs syndrome: Secondary | ICD-10-CM | POA: Diagnosis not present

## 2017-06-30 DIAGNOSIS — Z125 Encounter for screening for malignant neoplasm of prostate: Secondary | ICD-10-CM | POA: Diagnosis not present

## 2017-07-01 DIAGNOSIS — N2581 Secondary hyperparathyroidism of renal origin: Secondary | ICD-10-CM | POA: Diagnosis not present

## 2017-07-01 DIAGNOSIS — N186 End stage renal disease: Secondary | ICD-10-CM | POA: Diagnosis not present

## 2017-07-04 DIAGNOSIS — N2581 Secondary hyperparathyroidism of renal origin: Secondary | ICD-10-CM | POA: Diagnosis not present

## 2017-07-04 DIAGNOSIS — N186 End stage renal disease: Secondary | ICD-10-CM | POA: Diagnosis not present

## 2017-07-06 DIAGNOSIS — N186 End stage renal disease: Secondary | ICD-10-CM | POA: Diagnosis not present

## 2017-07-06 DIAGNOSIS — N2581 Secondary hyperparathyroidism of renal origin: Secondary | ICD-10-CM | POA: Diagnosis not present

## 2017-07-08 DIAGNOSIS — N186 End stage renal disease: Secondary | ICD-10-CM | POA: Diagnosis not present

## 2017-07-08 DIAGNOSIS — N2581 Secondary hyperparathyroidism of renal origin: Secondary | ICD-10-CM | POA: Diagnosis not present

## 2017-07-11 DIAGNOSIS — N2581 Secondary hyperparathyroidism of renal origin: Secondary | ICD-10-CM | POA: Diagnosis not present

## 2017-07-11 DIAGNOSIS — N186 End stage renal disease: Secondary | ICD-10-CM | POA: Diagnosis not present

## 2017-07-13 DIAGNOSIS — N186 End stage renal disease: Secondary | ICD-10-CM | POA: Diagnosis not present

## 2017-07-13 DIAGNOSIS — N2581 Secondary hyperparathyroidism of renal origin: Secondary | ICD-10-CM | POA: Diagnosis not present

## 2017-07-15 DIAGNOSIS — N186 End stage renal disease: Secondary | ICD-10-CM | POA: Diagnosis not present

## 2017-07-15 DIAGNOSIS — N2581 Secondary hyperparathyroidism of renal origin: Secondary | ICD-10-CM | POA: Diagnosis not present

## 2017-07-18 DIAGNOSIS — N186 End stage renal disease: Secondary | ICD-10-CM | POA: Diagnosis not present

## 2017-07-18 DIAGNOSIS — N2581 Secondary hyperparathyroidism of renal origin: Secondary | ICD-10-CM | POA: Diagnosis not present

## 2017-07-20 DIAGNOSIS — N2581 Secondary hyperparathyroidism of renal origin: Secondary | ICD-10-CM | POA: Diagnosis not present

## 2017-07-20 DIAGNOSIS — N186 End stage renal disease: Secondary | ICD-10-CM | POA: Diagnosis not present

## 2017-07-22 DIAGNOSIS — N186 End stage renal disease: Secondary | ICD-10-CM | POA: Diagnosis not present

## 2017-07-22 DIAGNOSIS — N2581 Secondary hyperparathyroidism of renal origin: Secondary | ICD-10-CM | POA: Diagnosis not present

## 2017-07-25 DIAGNOSIS — N186 End stage renal disease: Secondary | ICD-10-CM | POA: Diagnosis not present

## 2017-07-25 DIAGNOSIS — N2581 Secondary hyperparathyroidism of renal origin: Secondary | ICD-10-CM | POA: Diagnosis not present

## 2017-07-27 DIAGNOSIS — N186 End stage renal disease: Secondary | ICD-10-CM | POA: Diagnosis not present

## 2017-07-27 DIAGNOSIS — N2581 Secondary hyperparathyroidism of renal origin: Secondary | ICD-10-CM | POA: Diagnosis not present

## 2017-07-29 DIAGNOSIS — N2581 Secondary hyperparathyroidism of renal origin: Secondary | ICD-10-CM | POA: Diagnosis not present

## 2017-07-29 DIAGNOSIS — N186 End stage renal disease: Secondary | ICD-10-CM | POA: Diagnosis not present

## 2017-08-01 DIAGNOSIS — N186 End stage renal disease: Secondary | ICD-10-CM | POA: Diagnosis not present

## 2017-08-01 DIAGNOSIS — N2581 Secondary hyperparathyroidism of renal origin: Secondary | ICD-10-CM | POA: Diagnosis not present

## 2017-08-03 DIAGNOSIS — N2581 Secondary hyperparathyroidism of renal origin: Secondary | ICD-10-CM | POA: Diagnosis not present

## 2017-08-03 DIAGNOSIS — N186 End stage renal disease: Secondary | ICD-10-CM | POA: Diagnosis not present

## 2017-08-05 DIAGNOSIS — N2581 Secondary hyperparathyroidism of renal origin: Secondary | ICD-10-CM | POA: Diagnosis not present

## 2017-08-05 DIAGNOSIS — N186 End stage renal disease: Secondary | ICD-10-CM | POA: Diagnosis not present

## 2017-08-08 DIAGNOSIS — N2581 Secondary hyperparathyroidism of renal origin: Secondary | ICD-10-CM | POA: Diagnosis not present

## 2017-08-08 DIAGNOSIS — N186 End stage renal disease: Secondary | ICD-10-CM | POA: Diagnosis not present

## 2017-08-10 DIAGNOSIS — N186 End stage renal disease: Secondary | ICD-10-CM | POA: Diagnosis not present

## 2017-08-10 DIAGNOSIS — N2581 Secondary hyperparathyroidism of renal origin: Secondary | ICD-10-CM | POA: Diagnosis not present

## 2017-08-12 DIAGNOSIS — N2581 Secondary hyperparathyroidism of renal origin: Secondary | ICD-10-CM | POA: Diagnosis not present

## 2017-08-12 DIAGNOSIS — N186 End stage renal disease: Secondary | ICD-10-CM | POA: Diagnosis not present

## 2017-08-15 DIAGNOSIS — N186 End stage renal disease: Secondary | ICD-10-CM | POA: Diagnosis not present

## 2017-08-15 DIAGNOSIS — N2581 Secondary hyperparathyroidism of renal origin: Secondary | ICD-10-CM | POA: Diagnosis not present

## 2017-08-17 DIAGNOSIS — N2581 Secondary hyperparathyroidism of renal origin: Secondary | ICD-10-CM | POA: Diagnosis not present

## 2017-08-17 DIAGNOSIS — N186 End stage renal disease: Secondary | ICD-10-CM | POA: Diagnosis not present

## 2017-08-19 DIAGNOSIS — N2581 Secondary hyperparathyroidism of renal origin: Secondary | ICD-10-CM | POA: Diagnosis not present

## 2017-08-19 DIAGNOSIS — N186 End stage renal disease: Secondary | ICD-10-CM | POA: Diagnosis not present

## 2017-08-22 DIAGNOSIS — N2581 Secondary hyperparathyroidism of renal origin: Secondary | ICD-10-CM | POA: Diagnosis not present

## 2017-08-22 DIAGNOSIS — N186 End stage renal disease: Secondary | ICD-10-CM | POA: Diagnosis not present

## 2017-08-24 DIAGNOSIS — N186 End stage renal disease: Secondary | ICD-10-CM | POA: Diagnosis not present

## 2017-08-24 DIAGNOSIS — N2581 Secondary hyperparathyroidism of renal origin: Secondary | ICD-10-CM | POA: Diagnosis not present

## 2017-08-25 DIAGNOSIS — E1129 Type 2 diabetes mellitus with other diabetic kidney complication: Secondary | ICD-10-CM | POA: Diagnosis not present

## 2017-08-25 DIAGNOSIS — Z992 Dependence on renal dialysis: Secondary | ICD-10-CM | POA: Diagnosis not present

## 2017-08-25 DIAGNOSIS — N186 End stage renal disease: Secondary | ICD-10-CM | POA: Diagnosis not present

## 2017-08-26 DIAGNOSIS — N186 End stage renal disease: Secondary | ICD-10-CM | POA: Diagnosis not present

## 2017-08-26 DIAGNOSIS — N2581 Secondary hyperparathyroidism of renal origin: Secondary | ICD-10-CM | POA: Diagnosis not present

## 2017-08-29 DIAGNOSIS — N186 End stage renal disease: Secondary | ICD-10-CM | POA: Diagnosis not present

## 2017-08-29 DIAGNOSIS — N2581 Secondary hyperparathyroidism of renal origin: Secondary | ICD-10-CM | POA: Diagnosis not present

## 2017-08-31 DIAGNOSIS — N2581 Secondary hyperparathyroidism of renal origin: Secondary | ICD-10-CM | POA: Diagnosis not present

## 2017-08-31 DIAGNOSIS — N186 End stage renal disease: Secondary | ICD-10-CM | POA: Diagnosis not present

## 2017-08-31 DIAGNOSIS — E113313 Type 2 diabetes mellitus with moderate nonproliferative diabetic retinopathy with macular edema, bilateral: Secondary | ICD-10-CM | POA: Diagnosis not present

## 2017-09-02 DIAGNOSIS — N2581 Secondary hyperparathyroidism of renal origin: Secondary | ICD-10-CM | POA: Diagnosis not present

## 2017-09-02 DIAGNOSIS — N186 End stage renal disease: Secondary | ICD-10-CM | POA: Diagnosis not present

## 2017-09-05 DIAGNOSIS — N2581 Secondary hyperparathyroidism of renal origin: Secondary | ICD-10-CM | POA: Diagnosis not present

## 2017-09-05 DIAGNOSIS — N186 End stage renal disease: Secondary | ICD-10-CM | POA: Diagnosis not present

## 2017-09-07 DIAGNOSIS — N186 End stage renal disease: Secondary | ICD-10-CM | POA: Diagnosis not present

## 2017-09-07 DIAGNOSIS — N2581 Secondary hyperparathyroidism of renal origin: Secondary | ICD-10-CM | POA: Diagnosis not present

## 2017-09-08 DIAGNOSIS — Z1339 Encounter for screening examination for other mental health and behavioral disorders: Secondary | ICD-10-CM | POA: Diagnosis not present

## 2017-09-08 DIAGNOSIS — N410 Acute prostatitis: Secondary | ICD-10-CM | POA: Diagnosis not present

## 2017-09-08 DIAGNOSIS — N39498 Other specified urinary incontinence: Secondary | ICD-10-CM | POA: Diagnosis not present

## 2017-09-09 DIAGNOSIS — N2581 Secondary hyperparathyroidism of renal origin: Secondary | ICD-10-CM | POA: Diagnosis not present

## 2017-09-09 DIAGNOSIS — N186 End stage renal disease: Secondary | ICD-10-CM | POA: Diagnosis not present

## 2017-09-12 DIAGNOSIS — N186 End stage renal disease: Secondary | ICD-10-CM | POA: Diagnosis not present

## 2017-09-12 DIAGNOSIS — N2581 Secondary hyperparathyroidism of renal origin: Secondary | ICD-10-CM | POA: Diagnosis not present

## 2017-09-13 DIAGNOSIS — N2581 Secondary hyperparathyroidism of renal origin: Secondary | ICD-10-CM | POA: Diagnosis not present

## 2017-09-13 DIAGNOSIS — N186 End stage renal disease: Secondary | ICD-10-CM | POA: Diagnosis not present

## 2017-09-16 DIAGNOSIS — N2581 Secondary hyperparathyroidism of renal origin: Secondary | ICD-10-CM | POA: Diagnosis not present

## 2017-09-16 DIAGNOSIS — N186 End stage renal disease: Secondary | ICD-10-CM | POA: Diagnosis not present

## 2017-09-19 DIAGNOSIS — N2581 Secondary hyperparathyroidism of renal origin: Secondary | ICD-10-CM | POA: Diagnosis not present

## 2017-09-19 DIAGNOSIS — N186 End stage renal disease: Secondary | ICD-10-CM | POA: Diagnosis not present

## 2017-09-21 DIAGNOSIS — N186 End stage renal disease: Secondary | ICD-10-CM | POA: Diagnosis not present

## 2017-09-21 DIAGNOSIS — N2581 Secondary hyperparathyroidism of renal origin: Secondary | ICD-10-CM | POA: Diagnosis not present

## 2017-09-23 DIAGNOSIS — N186 End stage renal disease: Secondary | ICD-10-CM | POA: Diagnosis not present

## 2017-09-23 DIAGNOSIS — N2581 Secondary hyperparathyroidism of renal origin: Secondary | ICD-10-CM | POA: Diagnosis not present

## 2017-09-24 DIAGNOSIS — E1129 Type 2 diabetes mellitus with other diabetic kidney complication: Secondary | ICD-10-CM | POA: Diagnosis not present

## 2017-09-24 DIAGNOSIS — N186 End stage renal disease: Secondary | ICD-10-CM | POA: Diagnosis not present

## 2017-09-24 DIAGNOSIS — Z992 Dependence on renal dialysis: Secondary | ICD-10-CM | POA: Diagnosis not present

## 2017-09-26 DIAGNOSIS — N186 End stage renal disease: Secondary | ICD-10-CM | POA: Diagnosis not present

## 2017-09-26 DIAGNOSIS — N2581 Secondary hyperparathyroidism of renal origin: Secondary | ICD-10-CM | POA: Diagnosis not present

## 2017-09-28 DIAGNOSIS — N186 End stage renal disease: Secondary | ICD-10-CM | POA: Diagnosis not present

## 2017-09-28 DIAGNOSIS — N2581 Secondary hyperparathyroidism of renal origin: Secondary | ICD-10-CM | POA: Diagnosis not present

## 2017-09-30 DIAGNOSIS — N2581 Secondary hyperparathyroidism of renal origin: Secondary | ICD-10-CM | POA: Diagnosis not present

## 2017-09-30 DIAGNOSIS — N186 End stage renal disease: Secondary | ICD-10-CM | POA: Diagnosis not present

## 2017-10-03 DIAGNOSIS — N186 End stage renal disease: Secondary | ICD-10-CM | POA: Diagnosis not present

## 2017-10-03 DIAGNOSIS — N2581 Secondary hyperparathyroidism of renal origin: Secondary | ICD-10-CM | POA: Diagnosis not present

## 2017-10-05 DIAGNOSIS — N2581 Secondary hyperparathyroidism of renal origin: Secondary | ICD-10-CM | POA: Diagnosis not present

## 2017-10-05 DIAGNOSIS — N186 End stage renal disease: Secondary | ICD-10-CM | POA: Diagnosis not present

## 2017-10-07 DIAGNOSIS — N2581 Secondary hyperparathyroidism of renal origin: Secondary | ICD-10-CM | POA: Diagnosis not present

## 2017-10-07 DIAGNOSIS — N186 End stage renal disease: Secondary | ICD-10-CM | POA: Diagnosis not present

## 2017-10-09 DIAGNOSIS — Z136 Encounter for screening for cardiovascular disorders: Secondary | ICD-10-CM | POA: Diagnosis not present

## 2017-10-09 DIAGNOSIS — Z1331 Encounter for screening for depression: Secondary | ICD-10-CM | POA: Diagnosis not present

## 2017-10-09 DIAGNOSIS — Z9181 History of falling: Secondary | ICD-10-CM | POA: Diagnosis not present

## 2017-10-09 DIAGNOSIS — E1129 Type 2 diabetes mellitus with other diabetic kidney complication: Secondary | ICD-10-CM | POA: Diagnosis not present

## 2017-10-09 DIAGNOSIS — Z0000 Encounter for general adult medical examination without abnormal findings: Secondary | ICD-10-CM | POA: Diagnosis not present

## 2017-10-09 DIAGNOSIS — Z1339 Encounter for screening examination for other mental health and behavioral disorders: Secondary | ICD-10-CM | POA: Diagnosis not present

## 2017-10-09 DIAGNOSIS — E039 Hypothyroidism, unspecified: Secondary | ICD-10-CM | POA: Diagnosis not present

## 2017-10-09 DIAGNOSIS — E785 Hyperlipidemia, unspecified: Secondary | ICD-10-CM | POA: Diagnosis not present

## 2017-10-09 DIAGNOSIS — I129 Hypertensive chronic kidney disease with stage 1 through stage 4 chronic kidney disease, or unspecified chronic kidney disease: Secondary | ICD-10-CM | POA: Diagnosis not present

## 2017-10-10 DIAGNOSIS — N186 End stage renal disease: Secondary | ICD-10-CM | POA: Diagnosis not present

## 2017-10-10 DIAGNOSIS — N2581 Secondary hyperparathyroidism of renal origin: Secondary | ICD-10-CM | POA: Diagnosis not present

## 2017-10-12 DIAGNOSIS — N186 End stage renal disease: Secondary | ICD-10-CM | POA: Diagnosis not present

## 2017-10-12 DIAGNOSIS — N2581 Secondary hyperparathyroidism of renal origin: Secondary | ICD-10-CM | POA: Diagnosis not present

## 2017-10-14 DIAGNOSIS — N186 End stage renal disease: Secondary | ICD-10-CM | POA: Diagnosis not present

## 2017-10-14 DIAGNOSIS — N2581 Secondary hyperparathyroidism of renal origin: Secondary | ICD-10-CM | POA: Diagnosis not present

## 2017-10-17 DIAGNOSIS — N186 End stage renal disease: Secondary | ICD-10-CM | POA: Diagnosis not present

## 2017-10-17 DIAGNOSIS — N2581 Secondary hyperparathyroidism of renal origin: Secondary | ICD-10-CM | POA: Diagnosis not present

## 2017-10-19 DIAGNOSIS — N2581 Secondary hyperparathyroidism of renal origin: Secondary | ICD-10-CM | POA: Diagnosis not present

## 2017-10-19 DIAGNOSIS — N186 End stage renal disease: Secondary | ICD-10-CM | POA: Diagnosis not present

## 2017-10-21 DIAGNOSIS — N2581 Secondary hyperparathyroidism of renal origin: Secondary | ICD-10-CM | POA: Diagnosis not present

## 2017-10-21 DIAGNOSIS — N186 End stage renal disease: Secondary | ICD-10-CM | POA: Diagnosis not present

## 2017-10-24 DIAGNOSIS — N186 End stage renal disease: Secondary | ICD-10-CM | POA: Diagnosis not present

## 2017-10-24 DIAGNOSIS — N2581 Secondary hyperparathyroidism of renal origin: Secondary | ICD-10-CM | POA: Diagnosis not present

## 2017-10-25 DIAGNOSIS — Z992 Dependence on renal dialysis: Secondary | ICD-10-CM | POA: Diagnosis not present

## 2017-10-25 DIAGNOSIS — N186 End stage renal disease: Secondary | ICD-10-CM | POA: Diagnosis not present

## 2017-10-25 DIAGNOSIS — E1129 Type 2 diabetes mellitus with other diabetic kidney complication: Secondary | ICD-10-CM | POA: Diagnosis not present

## 2017-10-26 DIAGNOSIS — N2581 Secondary hyperparathyroidism of renal origin: Secondary | ICD-10-CM | POA: Diagnosis not present

## 2017-10-26 DIAGNOSIS — N186 End stage renal disease: Secondary | ICD-10-CM | POA: Diagnosis not present

## 2017-10-28 DIAGNOSIS — N186 End stage renal disease: Secondary | ICD-10-CM | POA: Diagnosis not present

## 2017-10-28 DIAGNOSIS — N2581 Secondary hyperparathyroidism of renal origin: Secondary | ICD-10-CM | POA: Diagnosis not present

## 2017-10-31 DIAGNOSIS — N2581 Secondary hyperparathyroidism of renal origin: Secondary | ICD-10-CM | POA: Diagnosis not present

## 2017-10-31 DIAGNOSIS — N186 End stage renal disease: Secondary | ICD-10-CM | POA: Diagnosis not present

## 2017-11-02 DIAGNOSIS — N186 End stage renal disease: Secondary | ICD-10-CM | POA: Diagnosis not present

## 2017-11-02 DIAGNOSIS — N2581 Secondary hyperparathyroidism of renal origin: Secondary | ICD-10-CM | POA: Diagnosis not present

## 2017-11-04 DIAGNOSIS — N2581 Secondary hyperparathyroidism of renal origin: Secondary | ICD-10-CM | POA: Diagnosis not present

## 2017-11-04 DIAGNOSIS — N186 End stage renal disease: Secondary | ICD-10-CM | POA: Diagnosis not present

## 2017-11-07 DIAGNOSIS — N186 End stage renal disease: Secondary | ICD-10-CM | POA: Diagnosis not present

## 2017-11-07 DIAGNOSIS — N2581 Secondary hyperparathyroidism of renal origin: Secondary | ICD-10-CM | POA: Diagnosis not present

## 2017-11-09 DIAGNOSIS — N186 End stage renal disease: Secondary | ICD-10-CM | POA: Diagnosis not present

## 2017-11-09 DIAGNOSIS — N2581 Secondary hyperparathyroidism of renal origin: Secondary | ICD-10-CM | POA: Diagnosis not present

## 2017-11-11 DIAGNOSIS — N186 End stage renal disease: Secondary | ICD-10-CM | POA: Diagnosis not present

## 2017-11-11 DIAGNOSIS — N2581 Secondary hyperparathyroidism of renal origin: Secondary | ICD-10-CM | POA: Diagnosis not present

## 2017-11-14 DIAGNOSIS — N2581 Secondary hyperparathyroidism of renal origin: Secondary | ICD-10-CM | POA: Diagnosis not present

## 2017-11-14 DIAGNOSIS — N186 End stage renal disease: Secondary | ICD-10-CM | POA: Diagnosis not present

## 2017-11-16 DIAGNOSIS — N186 End stage renal disease: Secondary | ICD-10-CM | POA: Diagnosis not present

## 2017-11-16 DIAGNOSIS — N2581 Secondary hyperparathyroidism of renal origin: Secondary | ICD-10-CM | POA: Diagnosis not present

## 2017-11-18 DIAGNOSIS — N2581 Secondary hyperparathyroidism of renal origin: Secondary | ICD-10-CM | POA: Diagnosis not present

## 2017-11-18 DIAGNOSIS — N186 End stage renal disease: Secondary | ICD-10-CM | POA: Diagnosis not present

## 2017-11-21 ENCOUNTER — Encounter (HOSPITAL_COMMUNITY): Payer: Self-pay | Admitting: General Practice

## 2017-11-21 ENCOUNTER — Other Ambulatory Visit: Payer: Self-pay

## 2017-11-21 ENCOUNTER — Inpatient Hospital Stay (HOSPITAL_COMMUNITY)
Admission: AD | Admit: 2017-11-21 | Discharge: 2017-11-24 | DRG: 091 | Disposition: A | Discharge status: Home Health | Payer: Medicare HMO | Source: Other Acute Inpatient Hospital | Attending: Family Medicine | Admitting: Family Medicine

## 2017-11-21 DIAGNOSIS — I69951 Hemiplegia and hemiparesis following unspecified cerebrovascular disease affecting right dominant side: Secondary | ICD-10-CM | POA: Diagnosis not present

## 2017-11-21 DIAGNOSIS — I639 Cerebral infarction, unspecified: Secondary | ICD-10-CM | POA: Diagnosis not present

## 2017-11-21 DIAGNOSIS — R471 Dysarthria and anarthria: Secondary | ICD-10-CM | POA: Diagnosis not present

## 2017-11-21 DIAGNOSIS — N2581 Secondary hyperparathyroidism of renal origin: Secondary | ICD-10-CM | POA: Diagnosis present

## 2017-11-21 DIAGNOSIS — Z794 Long term (current) use of insulin: Secondary | ICD-10-CM

## 2017-11-21 DIAGNOSIS — I10 Essential (primary) hypertension: Secondary | ICD-10-CM | POA: Diagnosis not present

## 2017-11-21 DIAGNOSIS — G2581 Restless legs syndrome: Secondary | ICD-10-CM | POA: Diagnosis present

## 2017-11-21 DIAGNOSIS — D649 Anemia, unspecified: Secondary | ICD-10-CM | POA: Diagnosis present

## 2017-11-21 DIAGNOSIS — E1165 Type 2 diabetes mellitus with hyperglycemia: Secondary | ICD-10-CM | POA: Diagnosis present

## 2017-11-21 DIAGNOSIS — R4182 Altered mental status, unspecified: Secondary | ICD-10-CM | POA: Diagnosis not present

## 2017-11-21 DIAGNOSIS — Z79899 Other long term (current) drug therapy: Secondary | ICD-10-CM | POA: Diagnosis not present

## 2017-11-21 DIAGNOSIS — I132 Hypertensive heart and chronic kidney disease with heart failure and with stage 5 chronic kidney disease, or end stage renal disease: Secondary | ICD-10-CM | POA: Diagnosis not present

## 2017-11-21 DIAGNOSIS — R278 Other lack of coordination: Secondary | ICD-10-CM | POA: Diagnosis not present

## 2017-11-21 DIAGNOSIS — Z8673 Personal history of transient ischemic attack (TIA), and cerebral infarction without residual deficits: Secondary | ICD-10-CM

## 2017-11-21 DIAGNOSIS — D631 Anemia in chronic kidney disease: Secondary | ICD-10-CM | POA: Diagnosis not present

## 2017-11-21 DIAGNOSIS — Z992 Dependence on renal dialysis: Secondary | ICD-10-CM

## 2017-11-21 DIAGNOSIS — E1122 Type 2 diabetes mellitus with diabetic chronic kidney disease: Secondary | ICD-10-CM | POA: Diagnosis present

## 2017-11-21 DIAGNOSIS — I6350 Cerebral infarction due to unspecified occlusion or stenosis of unspecified cerebral artery: Secondary | ICD-10-CM | POA: Diagnosis not present

## 2017-11-21 DIAGNOSIS — G9340 Encephalopathy, unspecified: Secondary | ICD-10-CM | POA: Diagnosis present

## 2017-11-21 DIAGNOSIS — N186 End stage renal disease: Secondary | ICD-10-CM | POA: Diagnosis present

## 2017-11-21 DIAGNOSIS — I160 Hypertensive urgency: Secondary | ICD-10-CM | POA: Diagnosis present

## 2017-11-21 DIAGNOSIS — Z7951 Long term (current) use of inhaled steroids: Secondary | ICD-10-CM | POA: Diagnosis not present

## 2017-11-21 DIAGNOSIS — E039 Hypothyroidism, unspecified: Secondary | ICD-10-CM | POA: Diagnosis present

## 2017-11-21 DIAGNOSIS — J449 Chronic obstructive pulmonary disease, unspecified: Secondary | ICD-10-CM | POA: Diagnosis not present

## 2017-11-21 DIAGNOSIS — G92 Toxic encephalopathy: Principal | ICD-10-CM | POA: Diagnosis present

## 2017-11-21 DIAGNOSIS — I700 Atherosclerosis of aorta: Secondary | ICD-10-CM | POA: Diagnosis not present

## 2017-11-21 DIAGNOSIS — I5030 Unspecified diastolic (congestive) heart failure: Secondary | ICD-10-CM | POA: Diagnosis not present

## 2017-11-21 DIAGNOSIS — R2689 Other abnormalities of gait and mobility: Secondary | ICD-10-CM | POA: Diagnosis not present

## 2017-11-21 DIAGNOSIS — R55 Syncope and collapse: Secondary | ICD-10-CM | POA: Diagnosis present

## 2017-11-21 DIAGNOSIS — N189 Chronic kidney disease, unspecified: Secondary | ICD-10-CM | POA: Diagnosis not present

## 2017-11-21 DIAGNOSIS — E1142 Type 2 diabetes mellitus with diabetic polyneuropathy: Secondary | ICD-10-CM | POA: Diagnosis not present

## 2017-11-21 DIAGNOSIS — W19XXXA Unspecified fall, initial encounter: Secondary | ICD-10-CM | POA: Diagnosis not present

## 2017-11-21 DIAGNOSIS — R531 Weakness: Secondary | ICD-10-CM | POA: Diagnosis not present

## 2017-11-21 DIAGNOSIS — R4781 Slurred speech: Secondary | ICD-10-CM | POA: Diagnosis not present

## 2017-11-21 DIAGNOSIS — I5032 Chronic diastolic (congestive) heart failure: Secondary | ICD-10-CM | POA: Diagnosis present

## 2017-11-21 DIAGNOSIS — I120 Hypertensive chronic kidney disease with stage 5 chronic kidney disease or end stage renal disease: Secondary | ICD-10-CM | POA: Diagnosis not present

## 2017-11-21 DIAGNOSIS — I509 Heart failure, unspecified: Secondary | ICD-10-CM | POA: Diagnosis not present

## 2017-11-21 DIAGNOSIS — K219 Gastro-esophageal reflux disease without esophagitis: Secondary | ICD-10-CM | POA: Diagnosis present

## 2017-11-21 DIAGNOSIS — R569 Unspecified convulsions: Secondary | ICD-10-CM | POA: Diagnosis not present

## 2017-11-21 DIAGNOSIS — R404 Transient alteration of awareness: Secondary | ICD-10-CM | POA: Diagnosis not present

## 2017-11-21 DIAGNOSIS — R4020 Unspecified coma: Secondary | ICD-10-CM | POA: Diagnosis not present

## 2017-11-21 DIAGNOSIS — N179 Acute kidney failure, unspecified: Secondary | ICD-10-CM | POA: Diagnosis not present

## 2017-11-21 DIAGNOSIS — E119 Type 2 diabetes mellitus without complications: Secondary | ICD-10-CM

## 2017-11-21 HISTORY — DX: Type 2 diabetes mellitus with diabetic polyneuropathy: E11.42

## 2017-11-21 HISTORY — DX: Type 2 diabetes mellitus without complications: E11.9

## 2017-11-21 HISTORY — DX: End stage renal disease: N18.6

## 2017-11-21 HISTORY — DX: End stage renal disease: Z99.2

## 2017-11-21 HISTORY — DX: Unspecified osteoarthritis, unspecified site: M19.90

## 2017-11-21 LAB — COMPREHENSIVE METABOLIC PANEL
ALK PHOS: 79 U/L (ref 38–126)
ALT: 16 U/L (ref 0–44)
AST: 17 U/L (ref 15–41)
Albumin: 3.5 g/dL (ref 3.5–5.0)
Anion gap: 16 — ABNORMAL HIGH (ref 5–15)
BILIRUBIN TOTAL: 0.6 mg/dL (ref 0.3–1.2)
BUN: 48 mg/dL — ABNORMAL HIGH (ref 8–23)
CALCIUM: 8.8 mg/dL — AB (ref 8.9–10.3)
CO2: 25 mmol/L (ref 22–32)
CREATININE: 7.99 mg/dL — AB (ref 0.61–1.24)
Chloride: 100 mmol/L (ref 98–111)
GFR, EST AFRICAN AMERICAN: 7 mL/min — AB (ref >60)
GFR, EST NON AFRICAN AMERICAN: 6 mL/min — AB (ref >60)
Glucose, Bld: 87 mg/dL (ref 70–99)
Potassium: 3.5 mmol/L (ref 3.5–5.1)
SODIUM: 141 mmol/L (ref 135–145)
Total Protein: 7.1 g/dL (ref 6.5–8.1)

## 2017-11-21 LAB — CBC
HEMATOCRIT: 35.4 % — AB (ref 39.0–52.0)
HEMOGLOBIN: 12 g/dL — AB (ref 13.0–17.0)
MCH: 31 pg (ref 26.0–34.0)
MCHC: 33.9 g/dL (ref 30.0–36.0)
MCV: 91.5 fL (ref 78.0–100.0)
Platelets: 144 10*3/uL — ABNORMAL LOW (ref 150–400)
RBC: 3.87 MIL/uL — ABNORMAL LOW (ref 4.22–5.81)
RDW: 13.7 % (ref 11.5–15.5)
WBC: 6.7 10*3/uL (ref 4.0–10.5)

## 2017-11-21 LAB — GLUCOSE, CAPILLARY
GLUCOSE-CAPILLARY: 143 mg/dL — AB (ref 70–99)
GLUCOSE-CAPILLARY: 75 mg/dL (ref 70–99)

## 2017-11-21 LAB — HEMOGLOBIN A1C
Hgb A1c MFr Bld: 9.1 % — ABNORMAL HIGH (ref 4.8–5.6)
Mean Plasma Glucose: 214.47 mg/dL

## 2017-11-21 LAB — AMMONIA: Ammonia: 21 umol/L (ref 9–35)

## 2017-11-21 LAB — VITAMIN B12: Vitamin B-12: 223 pg/mL (ref 180–914)

## 2017-11-21 LAB — TSH: TSH: 2.533 u[IU]/mL (ref 0.350–4.500)

## 2017-11-21 LAB — T4, FREE: Free T4: 0.93 ng/dL (ref 0.82–1.77)

## 2017-11-21 LAB — CK: CK TOTAL: 83 U/L (ref 49–397)

## 2017-11-21 MED ORDER — INSULIN ASPART 100 UNIT/ML ~~LOC~~ SOLN
0.0000 [IU] | SUBCUTANEOUS | Status: DC
  Administered 2017-11-22: 1 [IU] via SUBCUTANEOUS
  Administered 2017-11-22: 7 [IU] via SUBCUTANEOUS
  Administered 2017-11-22 – 2017-11-23 (×2): 2 [IU] via SUBCUTANEOUS
  Administered 2017-11-23: 1 [IU] via SUBCUTANEOUS
  Administered 2017-11-23 (×2): 9 [IU] via SUBCUTANEOUS
  Administered 2017-11-23: 2 [IU] via SUBCUTANEOUS
  Administered 2017-11-24: 3 [IU] via SUBCUTANEOUS
  Administered 2017-11-24: 7 [IU] via SUBCUTANEOUS
  Administered 2017-11-24: 3 [IU] via SUBCUTANEOUS
  Administered 2017-11-24: 2 [IU] via SUBCUTANEOUS

## 2017-11-21 MED ORDER — ACETAMINOPHEN 160 MG/5ML PO SOLN
650.0000 mg | ORAL | Status: DC | PRN
  Administered 2017-11-23: 650 mg
  Filled 2017-11-21: qty 20.3

## 2017-11-21 MED ORDER — ASPIRIN 325 MG PO TABS: 325.0000 mg | ORAL_TABLET | Freq: Every day | ORAL | Status: DC

## 2017-11-21 MED ORDER — ACETAMINOPHEN 325 MG PO TABS: 650.0000 mg | ORAL_TABLET | ORAL | Status: DC | PRN

## 2017-11-21 MED ORDER — SODIUM CHLORIDE 0.9 % IV SOLN: INTRAVENOUS | Status: DC

## 2017-11-21 MED ORDER — HEPARIN SODIUM (PORCINE) 5000 UNIT/ML IJ SOLN
5000.0000 [IU] | Freq: Three times a day (TID) | INTRAMUSCULAR | Status: DC
  Administered 2017-11-21 – 2017-11-24 (×7): 5000 [IU] via SUBCUTANEOUS
  Filled 2017-11-21 (×7): qty 1

## 2017-11-21 MED ORDER — ASPIRIN 300 MG RE SUPP
300.0000 mg | Freq: Every day | RECTAL | Status: DC
  Administered 2017-11-21 – 2017-11-22 (×2): 300 mg via RECTAL
  Filled 2017-11-21: qty 1

## 2017-11-21 MED ORDER — STROKE: EARLY STAGES OF RECOVERY BOOK
Freq: Once | Status: AC
  Administered 2017-11-21: 19:00:00

## 2017-11-21 MED ORDER — ACETAMINOPHEN 650 MG RE SUPP: 650.0000 mg | RECTAL | Status: DC | PRN

## 2017-11-21 NOTE — Consult Note (Signed)
Neurology Consultation  Reason for Consult: Altered mental status Referring Physician: Dr. Maryland Pink  CC: Altered mental status, slurred speech  History is obtained from: wife, chart  HPI: Alexander Whitaker is a 67 y.o. male with a past medical history of CHF, ESRD on Tuesday Thursday Saturday dialysis, history of old stroke with no residual deficits, diabetes, hypertension, hypothyroidism, neuropathy, restless leg syndrome, was in his usual state of health at 11 PM last night.  His wife left home this morning and called at 8 AM to check on him because he had to go to his dialysis and he sounded fine on the phone but when she came back up to the house she found him unresponsive and unable to talk.  She was startled by this acute change in him.  He was taken to [facility] for evaluation as an acute code stroke.  No focal deficits were noted apparently on the telemedicine neurology consult and he was deemed to be outside the TPA window, for which reason TPA was not given. A CT head and CT angiogram of the head and neck was done that was unremarkable. Patient was transferred to [facility] for further management as he continued to be altered, unaware of what he was and drowsy. Since his arrival at [facility], he has been more awake.  According to his wife his speech has been returning to normal and he has no dysarthria at this time.  He denies any headaches.  He denies any nausea vomiting.  He denies any chest pain shortness of breath.  He denies any visual symptoms.  Denies any focal tingling numbness or weakness. He is never had similar symptoms in the past. The wife denies any preceding illnesses or sicknesses prior to this presentation to the hospital. [place] as needed.  Does not take it every day.   LKW: 11pm, 11/20/17 tpa given?: no, OSW, possibly not a stroke Premorbid modified Rankin scale (mRS): 2  ROS: Review of systems performed is negative except as noted in  HPI.  Past Medical History:  Diagnosis Date  . Anemia   . CHF (congestive heart failure) ([place])   . CKD stage 3 due to type 2 diabetes mellitus ([place])    stage 5 Dialysis T/Th/Sa  . CVA (cerebral vascular accident) ([place]) 2016  . Diabetes mellitus ([place])   . Dyspnea   . GERD (gastroesophageal reflux disease)    has in the past  . Headache    since going on dialysis  . Hypertension   . Hypothyroidism   . Neuropathy   . PONV (postoperative nausea and vomiting)   . Restless legs     Family History  Problem Relation Age of Onset  . Leukemia Mother 47  . CVA Father 51    Social History:   reports that he has been smoking cigarettes. He has a 14.25 pack-year smoking history. He has never used smokeless tobacco. He reports that he does not drink alcohol or use drugs.  Medications  Current Facility-Administered Medications:  .  0.9 %  sodium chloride infusion, , Intravenous, Continuous, Bonnielee Haff, MD .  acetaminophen (TYLENOL) tablet 650 mg, 650 mg, Oral, Q4H PRN **OR** acetaminophen (TYLENOL) solution 650 mg, 650 mg, Per Tube, Q4H PRN **OR** acetaminophen (TYLENOL) suppository 650 mg, 650 mg, Rectal, Q4H PRN, Bonnielee Haff, MD .  aspirin suppository 300 mg, 300 mg, Rectal, Daily, 300 mg at 11/21/17 1845 **OR** aspirin tablet 325 mg, 325 mg, Oral, Daily, Bonnielee Haff, MD .  heparin injection 5,000 Units, 5,000 Units, Subcutaneous, Q8H, Bonnielee Haff, MD .  insulin aspart (novoLOG) injection 0-9 Units, 0-9 Units, Subcutaneous, Q4H, Bonnielee Haff, MD   Exam: Current vital signs: BP (!) 187/75 (BP Location: Left Arm)   Pulse 72   Temp 97.7 F (36.5 C) (Axillary)   Resp 18   SpO2 100%  Vital signs in last 24 hours: Temp:  [97.7 F (36.5 C)] 97.7 F (36.5 C) (08/27 1740) Pulse Rate:  [72] 72 (08/27 1740) Resp:  [18] 18 (08/27 1740) BP: (187)/(75) 187/75 (08/27 1740) SpO2:  [100 %] 100 % (08/27 1740) General exam: Patient sleeping, in no acute distress.  Awakens  to voice.  Follows all commands. HEENT: Normocephalic, atraumatic, dry oral mucous membrane Lungs: Clear to auscultation Prevascular: S1-S2 heard regular rate rhythm Abdomen: Soft nondistended nontender Extremity: Warm well perfused Neurological exam Next line patient was sleeping, easily arousable.  Awakens to voice.  Follows all commands. His naming, comprehension and repetition are intact. His attention and concentration is slightly poor. His speech is mildly dysarthric with the wife says that is his baseline. Cranial nerves: Pupils are equal round reactive to light, extraocular movements are intact, visual fields are full, there is no facial asymmetry, facial sensation is intact bilaterally, palate elevates midline, shoulder shrug intact, tongue midline. Motor exam: He is antigravity in all 4 extremities with no vertical drift.  Of note, asterixis noted in outstretched arms bilaterally. Sensory exam: Intact to light touch with no extinction Coordination: Intact finger-nose-finger Gait testing was deferred at this point for patient safety and comfort. NIH stroke scale 1a Level of Conscious.: 0 1b LOC Questions: 0 1c LOC Commands: 0 2 Best Gaze: 0 3 Visual: 0 4 Facial Palsy: 0 5a Motor Arm - left: 0 5b Motor Arm - Right: 0 6a Motor Leg - Left: 0 6b Motor Leg - Right: 0 7 Limb Ataxia: 0 8 Sensory: 0 9 Best Language: 0 10 Dysarthria: 0 11 Extinct. and Inatten.: 0 TOTAL: 0  Labs I have reviewed labs in epic and the results pertinent to this consultation are: No labs done today at our facility. From the outside facility, hemoglobin 11.2, WBC 7.1, platelet 136, INR 1.1, sodium 138, potassium 4.5, chloride 100, bicarb 26, BUN 50, creatinine 7.8, glucose 222, calcium 8.8, total bilirubin 0.5, AST 15 ALT 19, alk phos 94, protein 7.1, abdomen 3.9.   Imaging I have reviewed the images obtained:  CT-scan of the brain-from the outside facility-no acute changes. The angiogram from the  outside facility-no LVO.  Assessment:  67 year old man with past history of CHF, ESRD on dialysis, history of stroke with no significant residual deficits, diabetes, hypertension, hypothyroidism, neuropathy, restless leg syndrome found to be in altered mental status and unable to talk this morning. No focal neurological deficits and last known normal outside the TPA window, hence did not receive TPA at the outside hospital. At this time, patient's exam is nonfocal with the exception of poor attention concentration. Low suspicion for seizure induced by benzo withdrawal as he does not take Klonopin every day. Most likely toxic metabolic encephalopathy given asterixis on exam.  The possibilities include hypertensive urgency. It would be prudent to rule out stroke given multiple risk factors.  Impression Likely toxic metabolic encephalopathy Evaluate for stroke Evaluate for hypertensive urgency.  Recommendations: Check CBC BMP again Check ammonia levels Check TSH and B12 levels MRI of the brain without contrast Hospitalist has ordered EEG, agree with obtaining routine EEG to rule out any  underlying electrographic abnormalities. No indication for antiepileptics at this time. Continue with home antiplatelet Continue with frequent neurochecks PT, OT, speech therapy N.p.o. until cleared bedside swallow evaluation and or when okay with the primary team. Given history of CHF, prudent to check 2D echo. Can check hemoglobin A1c and fasting lipid panel for risk factor stratification.  Neurology will follow with you.  -- Amie Portland, MD [facility] Pager: [phone] If 7pm to 7am, please call on call as listed on AMION.

## 2017-11-21 NOTE — H&P (Signed)
[facility] History and Physical  Alexander Whitaker:[id number] DOB: 1950/05/15 DOA: 11/21/2017   PCP: [name], MD  Specialists: Unknown  Chief Complaint: Altered mental status, slurred speech  HPI: Alexander Whitaker is a 67 y.o. male with a past medical history of end-stage renal disease on hemodialysis on Tuesday Thursday, Saturday, history of diabetes mellitus type 2, hypothyroidism, anemia of chronic disease, chronic diastolic CHF, essential hypertension, previous history of stroke, who lives with his wife.  Patient was brought into the emergency department at [facility] by EMS.  He was last known well last night at around 8:00p.  This morning the patient's wife left the home and at that time the patient was lying on his bed.  When she came back home around 10:20 AM she noticed that the patient was on the floor of the kitchen.  When she woke him up he was noted to have slurred speech.  EMS was called and the patient was brought into the hospital.  It is unclear if the patient had any focal weakness.  Apparently he has weakness in his right leg from his previous stroke.  Patient was evaluated by telemetry neurology.  He was outside the window for TPA.  A CT angiogram of the head and neck was performed which was unremarkable.  Patient was transferred over to [facility] for further management.  Here patient is noted to be confused.  He knew his name but could not tell where he was.  Could not tell the year or month.  He was able to follow a few commands.  History is extremely limited.  No family members present at this time.  Most of the information obtained from ER visit notes at [facility] and by the transporting crew.  Home Medications: This medication list is an old list.  Has not been reconciled yet. Prior to Admission medications   Medication Sig Start Date End Date Taking? Authorizing Provider  albuterol (PROVENTIL HFA;VENTOLIN HFA) 108 (90 Base) MCG/ACT  inhaler Inhale 2 puffs into the lungs every 6 (six) hours as needed for wheezing or shortness of breath.    [provider]  amLODipine (NORVASC) 10 MG tablet Take 10 mg by mouth daily.  09/16/16   [provider]  calcium acetate (PHOSLO) 667 MG capsule Take 1,334 mg by mouth 3 (three) times daily with meals.    [provider]  camphor-menthol Timoteo Ace) lotion Apply 1 application topically as needed for itching.    [provider]  diphenhydrAMINE (BENADRYL) 25 mg capsule Take 25 mg by mouth daily as needed for itching.    [provider]  hydrOXYzine (ATARAX/VISTARIL) 25 MG tablet Take 25 mg by mouth 2 (two) times daily as needed for itching.     [provider]  Insulin Detemir (LEVEMIR) 100 UNIT/ML Pen Inject 10 Units into the skin daily at 12 noon. 10/07/16   Alvia Grove, PA-C  levothyroxine (SYNTHROID, LEVOTHROID) 50 MCG tablet Take 50 mcg by mouth daily before breakfast.  09/19/16   [provider]  loratadine (CLARITIN) 10 MG tablet Take 10 mg by mouth daily as needed for rhinitis.     [provider]  Melatonin 10 MG CAPS Take by mouth.    [provider]  pramipexole (MIRAPEX) 0.5 MG tablet Take 0.5 mg by mouth at bedtime as needed (sleep).    [provider]  promethazine (PHENERGAN) 25 MG tablet Take 25 mg by mouth every 6 (six) hours  as needed for nausea or vomiting.    [provider]    Allergies:  Allergies  Allergen Reactions  . No Known Allergies     Past Medical History: Past Medical History:  Diagnosis Date  . Anemia   . CHF (congestive heart failure) ([place])   . CKD stage 3 due to type 2 diabetes mellitus ([place])    stage 5 Dialysis T/Th/Sa  . CVA (cerebral vascular accident) ([place]) 2016  . Diabetes mellitus ([place])   . Dyspnea   . GERD (gastroesophageal reflux disease)    has in the past  . Headache    since going on dialysis  . Hypertension   . Hypothyroidism   .  Neuropathy   . PONV (postoperative nausea and vomiting)   . Restless legs     Past Surgical History:  Procedure Laterality Date  . AV FISTULA PLACEMENT Right 10/07/2016   Procedure: RIGHT BRACHIOCEPHALIC ARTERIOVENOUS (AV) FISTULA CREATION;  Surgeon: Angelia Mould, MD;  Location: [place];  Service: Vascular;  Laterality: Right;  . BACK SURGERY    . CIRCUMCISION  2015  . HERNIA REPAIR  2016    Social History: Lives in [place] area with his spouse.  No other history is available at this time due to his encephalopathy.   Family History:  Family History  Problem Relation Age of Onset  . Leukemia Mother 41  . CVA Father 60     Review of Systems -unable to do due to his altered mental status  Physical Examination  Vitals:   11/21/17 1740  BP: (!) 187/75  Pulse: 72  Resp: 18  Temp: 97.7 F (36.5 C)  TempSrc: Axillary  SpO2: 100%    BP (!) 187/75 (BP Location: Left Arm)   Pulse 72   Temp 97.7 F (36.5 C) (Axillary)   Resp 18   SpO2 100%   General appearance: Awake.  Distracted.  Follows a few commands.  Does not appear to be in any distress. Head: Normocephalic, without obvious abnormality, atraumatic Eyes: No obvious abnormalities.  Pupils equal.  Able to move his eyes but does not fully follow the finger all the way around. Throat: lips, mucosa, and tongue normal; teeth and gums normal Neck: no adenopathy, no carotid bruit, no JVD, supple, symmetrical, trachea midline and thyroid not enlarged, symmetric, no tenderness/mass/nodules Resp: clear to auscultation bilaterally Cardio: regular rate and rhythm, S1, S2 normal, no murmur, click, rub or gallop GI: soft, non-tender; bowel sounds normal; no masses,  no organomegaly Extremities: AV fistula in the right upper arm with good bruit. Pulses: 2+ and symmetric Skin: Skin color, texture, turgor normal. No rashes or lesions Lymph nodes: Cervical, supraclavicular, and axillary nodes normal. Neurologic: He is awake.   Distracted.  No obvious facial asymmetry.  Tongue is midline.  Equal shoulder shrug.  Unable to assess for pronator drift but appears to have equal strength in both upper extremities.  Subtle weakness noted in the right lower extremity.    Labs on Admission:  Labs from outside facility reviewed.  Hemoglobin 11.2.  WBC 7.1.  Platelet count 136.  INR 1.1.  Sodium 138.  Potassium 4.5.  Chloride 100.  Bicarb 26.  BUN 50.  Creatinine 7.8.  Glucose 222.  Calcium 8.8.  Total bilirubin 0.5.  AST ALT 15 and 19 respectively.  Alkaline phosphatase 94.  Total protein 7.1.  Albumin 3.9.   Radiological Exams on Admission: Chest x-ray done at the outside facility did not show any active disease  process  CT of the head without contrast did not show any acute findings.  Cerebral volume loss was noted compared to CT scan from 2016.  CT angiogram of the head and neck showed no emergent large vessel occlusion or hemodynamically significant stenosis of the arteries of the head and neck.    My interpretation of Electrocardiogram: EKG has been ordered and is pending   Problem List  Active Problems:   Diabetes mellitus, type 2 ([place])   Hypothyroidism   Acute encephalopathy   Dysarthria   ESRD (end stage renal disease) ([place])   Essential hypertension   Normocytic anemia   Assessment: This is a 67 year old Caucasian male with past medical history as stated earlier who presents after he was found on the kitchen floor this morning by his wife.  He was thought to have slurred speech.  Concern is for stroke.  Patient apparently has has had some waxing and waning mental status as well and so differential includes seizure activity.  CT angiogram of the head and neck performed at the outside facility was unremarkable.  He was outside window for TPA.  He was seen by telemetry neurology in the other facility.  Plan: #1 Acute encephalopathy with dysarthria: Concern is for seizure versus stroke.  Other differential  include drug overdose versus drug withdrawal.  Apparently he takes Klonopin at home but bottle was not empty.  Currently patient is hemodynamically stable.  He is noted to be confused but is protecting his airway.  Stroke work-up will be initiated in the form of MRI brain.  Neurology will be consulted.  EEG will be ordered.  Seizure precautions.  Aspiration precautions.  PT OT and swallow evaluation.  He will be kept n.p.o. for now.  Neurochecks.  Urine drug screen.  Chest x-ray done at the outside facility was nonacute.  EKG will be ordered.  HbA1c.  Lipid panel.  He will be monitored on telemetry.  #2  End-stage renal disease on hemodialysis: He is on a Tuesday Thursday Saturday schedule.  Based on reports he did go for dialysis on Saturday but did not go for dialysis today which is his usual dialysis day.  Nephrology has been notified, Dr. Justin Mend.  No need for urgent dialysis tonight.  His potassium was noted to be normal.  No pulmonary edema on chest x-ray.  #3  History of diabetes mellitus type 2 with renal complications: Check YHC6C.  Sliding scale coverage.  He is known to be on insulin at home but unknown what kind.  We will wait for his medication list to be reconciled.  #4  Essential hypertension: Monitor blood pressures closely.  Permissive hypertension will be allowed for now.  #5  Normocytic anemia: Likely anemia of chronic kidney disease.  Monitor hemoglobin closely.  #6  Hypothyroidism: Check TSH free T4.  Resume his medications once medication list has been reconciled.  #7 Chronic diastolic CHF: Volume by being managed by hemodialysis.  Follow-up on echocardiogram.   DVT Prophylaxis: Subcutaneous heparin Code Status: Full code Family Communication: No family at bedside Consults called: Neurology.  Nephrology.  Severity of Illness: The appropriate patient status for this patient is INPATIENT. Inpatient status is judged to be reasonable and necessary in order to provide the required  intensity of service to ensure the patient's safety. The patient's presenting symptoms, physical exam findings, and initial radiographic and laboratory data in the context of their chronic comorbidities is felt to place them at high risk for further clinical deterioration. Furthermore, it  is not anticipated that the patient will be medically stable for discharge from the hospital within 2 midnights of admission. The following factors support the patient status of inpatient.   " The patient's presenting symptoms include dysarthria, acute confusion. " The worrisome physical exam findings include acute encephalopathy. " The initial radiographic and laboratory data are worrisome because of concern for acute stroke. " The chronic co-morbidities include diabetes mellitus, end-stage renal disease.   * I certify that at the point of admission it is my clinical judgment that the patient will require inpatient hospital care spanning beyond 2 midnights from the point of admission due to high intensity of service, high risk for further deterioration and high frequency of surveillance required.*  Further management decisions will depend on results of further testing and patient's response to treatment.   Bonnielee Haff  [facility] Pager [phone]  If 7PM-7AM, please contact night-coverage [email or web address] Password [facility]  11/21/2017, 6:07 PM

## 2017-11-22 ENCOUNTER — Inpatient Hospital Stay (HOSPITAL_COMMUNITY): Payer: Medicare HMO

## 2017-11-22 DIAGNOSIS — G9340 Encephalopathy, unspecified: Secondary | ICD-10-CM

## 2017-11-22 DIAGNOSIS — I5030 Unspecified diastolic (congestive) heart failure: Secondary | ICD-10-CM

## 2017-11-22 LAB — GLUCOSE, CAPILLARY
GLUCOSE-CAPILLARY: 228 mg/dL — AB (ref 70–99)
Glucose-Capillary: 109 mg/dL — ABNORMAL HIGH (ref 70–99)
Glucose-Capillary: 134 mg/dL — ABNORMAL HIGH (ref 70–99)
Glucose-Capillary: 156 mg/dL — ABNORMAL HIGH (ref 70–99)
Glucose-Capillary: 330 mg/dL — ABNORMAL HIGH (ref 70–99)
Glucose-Capillary: 362 mg/dL — ABNORMAL HIGH (ref 70–99)

## 2017-11-22 LAB — ECHOCARDIOGRAM COMPLETE
HEIGHTINCHES: 65 in
Weight: 2864.22 oz

## 2017-11-22 LAB — CBC
HCT: 34.4 % — ABNORMAL LOW (ref 39.0–52.0)
HEMOGLOBIN: 11.4 g/dL — AB (ref 13.0–17.0)
MCH: 30.6 pg (ref 26.0–34.0)
MCHC: 33.1 g/dL (ref 30.0–36.0)
MCV: 92.5 fL (ref 78.0–100.0)
Platelets: 148 10*3/uL — ABNORMAL LOW (ref 150–400)
RBC: 3.72 MIL/uL — AB (ref 4.22–5.81)
RDW: 14.1 % (ref 11.5–15.5)
WBC: 6.9 10*3/uL (ref 4.0–10.5)

## 2017-11-22 LAB — BASIC METABOLIC PANEL
ANION GAP: 15 (ref 5–15)
BUN: 48 mg/dL — ABNORMAL HIGH (ref 8–23)
CHLORIDE: 102 mmol/L (ref 98–111)
CO2: 25 mmol/L (ref 22–32)
Calcium: 8.7 mg/dL — ABNORMAL LOW (ref 8.9–10.3)
Creatinine, Ser: 8.22 mg/dL — ABNORMAL HIGH (ref 0.61–1.24)
GFR calc non Af Amer: 6 mL/min — ABNORMAL LOW (ref >60)
GFR, EST AFRICAN AMERICAN: 7 mL/min — AB (ref >60)
Glucose, Bld: 52 mg/dL — ABNORMAL LOW (ref 70–99)
POTASSIUM: 3.7 mmol/L (ref 3.5–5.1)
SODIUM: 142 mmol/L (ref 135–145)

## 2017-11-22 LAB — LIPID PANEL
CHOL/HDL RATIO: 6.8 ratio
CHOLESTEROL: 183 mg/dL (ref 0–200)
HDL: 27 mg/dL — AB (ref >40)
LDL Cholesterol: 99 mg/dL (ref 0–99)
TRIGLYCERIDES: 285 mg/dL — AB (ref <150)
VLDL: 57 mg/dL — AB (ref 0–40)

## 2017-11-22 LAB — HIV ANTIBODY (ROUTINE TESTING W REFLEX): HIV Screen 4th Generation wRfx: NONREACTIVE

## 2017-11-22 MED ORDER — LEVOTHYROXINE SODIUM 50 MCG PO TABS
50.0000 ug | ORAL_TABLET | Freq: Every day | ORAL | Status: DC
  Administered 2017-11-23 – 2017-11-24 (×2): 50 ug via ORAL
  Filled 2017-11-22 (×2): qty 1

## 2017-11-22 MED ORDER — ALUM & MAG HYDROXIDE-SIMETH 200-200-20 MG/5ML PO SUSP
15.0000 mL | Freq: Four times a day (QID) | ORAL | Status: DC | PRN
  Administered 2017-11-23 – 2017-11-24 (×2): 15 mL via ORAL
  Filled 2017-11-22 (×2): qty 30

## 2017-11-22 MED ORDER — HYDRALAZINE HCL 20 MG/ML IJ SOLN
5.0000 mg | INTRAMUSCULAR | Status: DC | PRN
  Administered 2017-11-23 – 2017-11-24 (×3): 5 mg via INTRAVENOUS
  Filled 2017-11-22 (×4): qty 1

## 2017-11-22 MED ORDER — AMLODIPINE BESYLATE 5 MG PO TABS
5.0000 mg | ORAL_TABLET | Freq: Every day | ORAL | Status: DC
  Administered 2017-11-23: 5 mg via ORAL
  Filled 2017-11-22: qty 1

## 2017-11-22 MED ORDER — CALCIUM ACETATE (PHOS BINDER) 667 MG PO CAPS
1334.0000 mg | ORAL_CAPSULE | Freq: Three times a day (TID) | ORAL | Status: DC
  Administered 2017-11-23 – 2017-11-24 (×4): 1334 mg via ORAL
  Filled 2017-11-22 (×4): qty 2

## 2017-11-22 NOTE — Progress Notes (Signed)
PROGRESS NOTE    Alexander Whitaker  YNW:[id number] DOB: [birth year] DOA: 11/21/2017 PCP: [name], MD    Brief Narrative:  67 year old male who presented with altered mental status and slurred speech.  Does have significant past medical history for end-stage renal disease on hemodialysis, type 2 diabetes mellitus, hypothyroidism, anemia chronic kidney disease, diastolic heart failure and hypertension. On the day of addmission patient was found down, with slurred speech. Apparently patient woke up at his normal state of health, had a shower with warm water, immediately after he walked into the kitchen, felt dizzy and lost his consciousness, unclear if he tripped with the carpet or head trauma.  On the initial physical examination blood pressure 187/75, heart rate 72, respiratory rate 18, temperature 97.7, oxygenation 100%.  His lungs were clear to auscultation bilaterally, heart S1-S2 present and rhythmic, abdomen soft nontender, no lower extremity edema.  Positive weakness on the right lower extremity.  Patient was admitted to the hospital with working diagnosis of acute neurologic focal deficit, dysarthria, rule out acute CVA.  Assessment & Plan:   Active Problems:   Diabetes mellitus, type 2 ([facility])   Hypothyroidism   Acute encephalopathy   Dysarthria   ESRD (end stage renal disease) ([facility])   Essential hypertension   Normocytic anemia   1. Syncope. Likely neuro-cardiogenic vasovagal episode, will continue neuro checks per unit protocol, follow on telemetry. On admission ordered echocardiography, speech and physical therapy evaluation. Further work up with brain MRI negative for acute CVA. Note that patient at home on benadryl, pramipexole, hydroxyzine.   2. ESRD on HD. Patient last HD on 08/24, plan for HD today. Continue to follow with nephrology recommendations, patient is not uremic on clinical examination.   3. HTN. Continue blood pressure monitoring.   4. Hypothyroid. Continue  levothyroxine.   5. T2DM. Will continue glucose cover and monitoring with insulin sliding scale.   DVT prophylaxis: heparin   Code Status:  full Family Communication: I spoke with patient's wife at the bedside and all questions were addressed.  Disposition Plan/ discharge barriers: Pending clinical improvement possible dc in am.    Consultants:   Nephrology   Neurology   Procedures:     Antimicrobials:       Subjective: Patient feeling better but not back to his baseline, has not got out of the bed yet, no nausea or vomiting, no dyspnea or chest pain.   Objective: Vitals:   11/22/17 0340 11/22/17 0400 11/22/17 0540 11/22/17 0736  BP: (!) 160/68  (!) 154/62 (!) 182/67  Pulse: 70  65 70  Resp: $Remo'16  17 16  'SeXaG$ Temp: (!) 97.5 F (36.4 C)  97.8 F (36.6 C) 99.3 F (37.4 C)  TempSrc: Oral  Oral   SpO2: 95%  95% 96%  Weight:  81.2 kg    Height:  $Remove'5\' 5"'oSaifan$  (1.651 m)      Intake/Output Summary (Last 24 hours) at 11/22/2017 1102 Last data filed at 11/22/2017 6578 Gross per 24 hour  Intake 360 ml  Output 200 ml  Net 160 ml   Filed Weights   11/22/17 0400  Weight: 81.2 kg    Examination:   General: Not in pain or dyspnea, deconditioned Neurology: Awake and alert, non focal  E ENT: mild pallor, no icterus, oral mucosa moist Cardiovascular: No JVD. S1-S2 present, rhythmic, no gallops, rubs, or murmurs. No lower extremity edema. Pulmonary: vesicular breath sounds bilaterally, adequate air movement, no wheezing, rhonchi or rales. Gastrointestinal. Abdomen with no organomegaly,  non tender, no rebound or guarding Skin. No rashes Musculoskeletal: no joint deformities     Data Reviewed: I have personally reviewed following labs and imaging studies  CBC: Recent Labs  Lab 11/21/17 1905 11/22/17 0322  WBC 6.7 6.9  HGB 12.0* 11.4*  HCT 35.4* 34.4*  MCV 91.5 92.5  PLT 144* 826*   Basic Metabolic Panel: Recent Labs  Lab 11/21/17 1905 11/22/17 0322  NA 141 142  K  3.5 3.7  CL 100 102  CO2 25 25  GLUCOSE 87 52*  BUN 48* 48*  CREATININE 7.99* 8.22*  CALCIUM 8.8* 8.7*   GFR: Estimated Creatinine Clearance: 8.3 mL/min (A) (by C-G formula based on SCr of 8.22 mg/dL (H)). Liver Function Tests: Recent Labs  Lab 11/21/17 1905  AST 17  ALT 16  ALKPHOS 79  BILITOT 0.6  PROT 7.1  ALBUMIN 3.5   No results for input(s): LIPASE, AMYLASE in the last 168 hours. Recent Labs  Lab 11/21/17 2008  AMMONIA 21   Coagulation Profile: No results for input(s): INR, PROTIME in the last 168 hours. Cardiac Enzymes: Recent Labs  Lab 11/21/17 1905  CKTOTAL 83   BNP (last 3 results) No results for input(s): PROBNP in the last 8760 hours. HbA1C: Recent Labs    11/21/17 1905  HGBA1C 9.1*   CBG: Recent Labs  Lab 11/21/17 2015 11/21/17 2355 11/22/17 0402 11/22/17 0758  GLUCAP 75 143* 156* 109*   Lipid Profile: Recent Labs    11/22/17 0322  CHOL 183  HDL 27*  LDLCALC 99  TRIG 285*  CHOLHDL 6.8   Thyroid Function Tests: Recent Labs    11/21/17 1905 11/21/17 2008  TSH  --  2.533  FREET4 0.93  --    Anemia Panel: Recent Labs    11/21/17 2008  VITAMINB12 223      Radiology Studies: I have reviewed all of the imaging during this hospital visit personally     Scheduled Meds: . aspirin  300 mg Rectal Daily   Or  . aspirin  325 mg Oral Daily  . heparin  5,000 Units Subcutaneous Q8H  . insulin aspart  0-9 Units Subcutaneous Q4H   Continuous Infusions: . sodium chloride       LOS: 1 day        Mauricio Gerome Apley, MD [facility] Pager [phone]

## 2017-11-22 NOTE — Progress Notes (Signed)
SLP Cancellation Note  Patient Details Name: Alexander Whitaker MRN: [id number] DOB: October 22, 1950   Cancelled treatment:       Reason Eval/Treat Not Completed: Patient at procedure or test/unavailable on 2 different attempts. Will continue efforts to complete SLE.  Celia B. Quentin [facility], CCC-SLP Speech Language Pathologist [phone]  Shonna Chock 11/22/2017, 2:40 PM

## 2017-11-22 NOTE — Progress Notes (Signed)
  Echocardiogram 2D Echocardiogram has been performed.  Jennette Dubin 11/22/2017, 10:01 AM

## 2017-11-22 NOTE — Evaluation (Signed)
Occupational Therapy Evaluation Patient Details Name: Alexander Whitaker MRN: [id number] DOB: [birth year] Today's Date: 11/22/2017    History of Present Illness Junius Faucett is a 67yo M with significant PMH of HTN, neuropathy, hypothyoidism, DM, CKD stage 3 on HD, h/o CVA and now preseenting with altered mental status and weakness.    Clinical Impression   Pt admitted with above dx and now presenting with cognitive deficits and generalized weakness that limit his independence with bed mobility, ADL transfers, and ADL. Pt required min A for bed mobility and ADL transfers, as well as heavy vc for sequencing transfers. Pt would benefit from continued OT services to maximize return to PLOF. OT will follow acutely to determine progress and recommend HHOT vs. CIR.    Follow Up Recommendations  Home health OT;Supervision/Assistance - 24 hour    Equipment Recommendations  None recommended by OT    Recommendations for Other Services PT consult;Speech consult     Precautions / Restrictions Precautions Precautions: Fall Restrictions Weight Bearing Restrictions: No      Mobility Bed Mobility Overal bed mobility: Needs Assistance Bed Mobility: Supine to Sit;Sit to Supine     Supine to sit: Min assist Sit to supine: Min assist   General bed mobility comments: HOB elevated and rails used  Transfers Overall transfer level: Needs assistance Equipment used: None Transfers: Sit to/from [facility] Sit to Stand: Min assist Stand pivot transfers: Min assist       General transfer comment: heavy vc for sequencing side stepping at EOB, mod A    Balance Overall balance assessment: Needs assistance Sitting-balance support: Feet supported;No upper extremity supported Sitting balance-Leahy Scale: Fair     Standing balance support: No upper extremity supported;During functional activity Standing balance-Leahy Scale: Poor                             ADL  either performed or assessed with clinical judgement   ADL Overall ADL's : Needs assistance/impaired Eating/Feeding: Modified independent   Grooming: Sitting;Wash/dry face;Modified independent                               Functional mobility during ADLs: Minimal assistance;Cueing for safety General ADL Comments: min A static standing balance provided during standing level toileting     Vision Baseline Vision/History: No visual deficits Patient Visual Report: No change from baseline Vision Assessment?: No apparent visual deficits     Perception Perception Perception Tested?: No   Praxis      Pertinent Vitals/Pain Pain Assessment: No/denies pain     Hand Dominance Right   Extremity/Trunk Assessment Upper Extremity Assessment Upper Extremity Assessment: Overall WFL for tasks assessed   Lower Extremity Assessment Lower Extremity Assessment: Defer to PT evaluation   Cervical / Trunk Assessment Cervical / Trunk Assessment: Normal   Communication Communication Communication: No difficulties   Cognition Arousal/Alertness: Awake/alert Behavior During Therapy: WFL for tasks assessed/performed Overall Cognitive Status: Impaired/Different from baseline Area of Impairment: Memory;Orientation;Awareness;Safety/judgement;Problem solving                 Orientation Level: Time;Place   Memory: Decreased short-term memory   Safety/Judgement: Decreased awareness of safety;Decreased awareness of deficits Awareness: Emergent Problem Solving: Decreased initiation;Slow processing     General Comments       Exercises     Shoulder Instructions      Home Living Family/patient expects to  be discharged to:: Private residence Living Arrangements: Spouse/significant other Available Help at Discharge: Family Type of Home: House Home Access: Stairs to enter Technical brewer of Steps: 6 Entrance Stairs-Rails: Right;Left Home Layout: One level      Bathroom Shower/Tub: Walk-in shower;Tub/shower unit   [employer]: Standard     Home Equipment: Shower seat - built in;Grab bars - tub/shower          Prior Functioning/Environment Level of Independence: Independent        Comments: drives, reports no hobbies        OT Problem List: Decreased strength;Decreased activity tolerance;Decreased range of motion;Impaired balance (sitting and/or standing);Cardiopulmonary status limiting activity;Decreased coordination;Decreased safety awareness;Decreased cognition;Decreased knowledge of use of DME or AE      OT Treatment/Interventions: Self-care/ADL training;Therapeutic exercise;Neuromuscular education;DME and/or AE instruction;Therapeutic activities;Cognitive remediation/compensation;Patient/family education;Balance training;Energy conservation    OT Goals(Current goals can be found in the care plan section) Acute Rehab OT Goals Patient Stated Goal: "go home tomorrow" OT Goal Formulation: With patient/family Time For Goal Achievement: 12/01/17 Potential to Achieve Goals: Good  OT Frequency: Min 2X/week   Barriers to D/C:            Co-evaluation              AM-PAC PT "6 Clicks" Daily Activity     Outcome Measure Help from another person eating meals?: None Help from another person taking care of personal grooming?: None Help from another person toileting, which includes using toliet, bedpan, or urinal?: A Little Help from another person bathing (including washing, rinsing, drying)?: A Little Help from another person to put on and taking off regular upper body clothing?: None Help from another person to put on and taking off regular lower body clothing?: A Little 6 Click Score: 21   End of Session Nurse Communication: Mobility status  Activity Tolerance: Patient tolerated treatment well Patient left: in bed;with call bell/phone within reach;with bed alarm set;with family/visitor present  OT Visit Diagnosis:  Unsteadiness on feet (R26.81);Muscle weakness (generalized) (M62.81)                Time: 1478-2956 OT Time Calculation (min): 20 min Charges:  OT General Charges $OT Visit: 1 Visit OT Evaluation $OT Eval Low Complexity: 1 Low  Curtis Sites OTR/L 11/22/2017, 9:12 AM

## 2017-11-22 NOTE — Consult Note (Signed)
[facility] Renal Consultation Note  Indication for Consultation:  Management of ESRD/hemodialysis; anemia, hypertension/volume and secondary hyperparathyroidism  HPI: Alexander Whitaker is a 67 y.o. male with ESRD 2/2 DM /HTN  Chronic HD [facility] TTS , ho CVA DM type 2, restless legs,missed last HD yest.because wife found on kitchen floor  with slurred speech /R leg weakness , confusion . He last attended HD Saturday  Leaving below edw by 0.5 kg and hypertensive.  Seen walking hall with PT this am and pleasantly confused to date,wife in room thinks better but not at baseline yet. We are consulted for HD /ERSD needs and plan for hd today. He denies sob, cp, only cos slightly sore in Right flank where fell down yesterday. Denies cough       Recent Vital Signs   BP (!) 182/67 (BP Location: Left Arm)   Pulse 70   Temp 99.3 F (37.4 C)   Resp 16   Ht _0  (1.651 m)   Wt 81.2 kg   SpO2 96%   BMI 29.79 kg/m    Past Medical History:  Diagnosis Date  . Anemia   . Arthritis    "probably in his joints" (11/21/2017)  . CHF (congestive heart failure) ([place]) ~ 2017  . CKD stage 3 due to type 2 diabetes mellitus ([place])    stage 5 Dialysis T/Th/Sa  . CVA (cerebral vascular accident) [facility]) 2007   wife denies residual on 11/21/2017  . Diabetic peripheral neuropathy ([place])   . Dyspnea   . ESRD (end stage renal disease) on dialysis ([place])    "[place]; TTS" (11/21/2017)  . GERD (gastroesophageal reflux disease)    has in the past  . Headache    since going on dialysis  . Hypertension   . Hypothyroidism   . PONV (postoperative nausea and vomiting)   . Restless legs   . Type II diabetes mellitus (HCC)           Significant Events    DC Barriers         Past Medical History:  Diagnosis Date  . Anemia   . Arthritis    "probably in his joints" (11/21/2017)  . CHF (congestive heart failure) ([place]) ~ 2017  . CKD stage 3 due to type 2 diabetes mellitus ([place])    stage 5  Dialysis T/Th/Sa  . CVA (cerebral vascular accident) [facility]) 2007   wife denies residual on 11/21/2017  . Diabetic peripheral neuropathy ([place])   . Dyspnea   . ESRD (end stage renal disease) on dialysis ([place])    "; TTS" (11/21/2017)  . GERD (gastroesophageal reflux disease)    has in the past  . Headache    since going on dialysis  . Hypertension   . Hypothyroidism   . PONV (postoperative nausea and vomiting)   . Restless legs   . Type II diabetes mellitus ([place])     Past Surgical History:  Procedure Laterality Date  . AV FISTULA PLACEMENT Right 10/07/2016   Procedure: RIGHT BRACHIOCEPHALIC ARTERIOVENOUS (AV) FISTULA CREATION;  Surgeon: Angelia Mould, MD;  Location: [place];  Service: Vascular;  Laterality: Right;  . BACK SURGERY    . CATARACT EXTRACTION W/ INTRAOCULAR LENS IMPLANT Left   . CIRCUMCISION  2015  . HERNIA REPAIR  2016   "stomach"  . POSTERIOR LUMBAR FUSION  1990s?   "has bolts and rods in there"      Family History  Problem Relation Age of Onset  .  Leukemia Mother 82  . CVA Father 30      reports that he quit smoking about 7 months ago. His smoking use included cigarettes. He has a 57.00 pack-year smoking history. He has never used smokeless tobacco. He reports that he drank alcohol. He reports that he does not use drugs.   Allergies  Allergen Reactions  . No Known Allergies     Prior to Admission medications   Medication Sig Start Date End Date Taking? Authorizing Provider  albuterol (PROVENTIL HFA;VENTOLIN HFA) 108 (90 Base) MCG/ACT inhaler Inhale 2 puffs into the lungs every 6 (six) hours as needed for wheezing or shortness of breath.   Yes [provider]  amLODipine (NORVASC) 5 MG tablet Take 5 mg by mouth daily.  09/16/16  Yes [provider]  calcium acetate (PHOSLO) 667 MG capsule Take 1,334 mg by mouth 3 (three) times daily with meals.   Yes [provider]  camphor-menthol Timoteo Ace) lotion Apply 1 application  topically as needed for itching.   Yes [provider]  clonazePAM (KLONOPIN) 2 MG tablet Take 2 mg by mouth 3 (three) times daily as needed. Restless legs and nerves 11/16/17  Yes [provider]  diphenhydrAMINE (BENADRYL) 25 mg capsule Take 25 mg by mouth daily as needed for itching.   Yes [provider]  gabapentin (NEURONTIN) 100 MG capsule Take 100 mg by mouth 3 (three) times daily. 10/23/17  Yes [provider]  hydrALAZINE (APRESOLINE) 25 MG tablet Take 25 mg by mouth 3 (three) times daily.   Yes [provider]  hydrOXYzine (ATARAX/VISTARIL) 25 MG tablet Take 25 mg by mouth 2 (two) times daily as needed for itching.    Yes [provider]  Insulin Detemir (LEVEMIR) 100 UNIT/ML Pen Inject 10 Units into the skin daily at 12 noon. 10/07/16  Yes Alvia Grove, PA-C  levothyroxine (SYNTHROID, LEVOTHROID) 50 MCG tablet Take 50 mcg by mouth daily before breakfast.  09/19/16  Yes [provider]  lidocaine-prilocaine (EMLA) cream Apply 1 application topically See admin instructions. Apply small amount to access site 1 to 2 hours before dialysis. 11/13/17  Yes [provider]  loratadine (CLARITIN) 10 MG tablet Take 10 mg by mouth daily as needed for rhinitis.    Yes [provider]  Melatonin 10 MG CAPS Take 10 mg by mouth at bedtime as needed (sleep).    Yes [provider]  pramipexole (MIRAPEX) 0.5 MG tablet Take 0.5 mg by mouth at bedtime as needed (sleep).   Yes [provider]  promethazine (PHENERGAN) 25 MG tablet Take 25 mg by mouth every 6 (six) hours as needed for nausea or vomiting.   Yes [provider]     Scheduled: . aspirin  300 mg Rectal Daily   Or  . aspirin  325 mg Oral Daily  . heparin  5,000 Units Subcutaneous Q8H  . insulin aspart  0-9 Units Subcutaneous Q4H   Anti-infectives (From admission, onward)   None      Results for orders placed or performed during the  hospital encounter of 11/21/17 (from the past 48 hour(s))  HIV antibody (Routine Testing)     Status: None   Collection Time: 11/21/17  7:05 PM  Result Value Ref Range   HIV Screen 4th Generation wRfx Non Reactive Non Reactive    Comment: (NOTE) Performed At: [facility] [address] [place], [place] [id number] Rush Farmer MD HU:[id number]   Hemoglobin A1c  Status: Abnormal   Collection Time: 11/21/17  7:05 PM  Result Value Ref Range   Hgb A1c MFr Bld 9.1 (H) 4.8 - 5.6 %    Comment: (NOTE) Pre diabetes:          5.7%-6.4% Diabetes:              >6.4% Glycemic control for   <7.0% adults with diabetes    Mean Plasma Glucose 214.47 mg/dL    Comment: Performed at [facility] [address], [place], [place] [address]  CBC     Status: Abnormal   Collection Time: 11/21/17  7:05 PM  Result Value Ref Range   WBC 6.7 4.0 - 10.5 K/uL   RBC 3.87 (L) 4.22 - 5.81 MIL/uL   Hemoglobin 12.0 (L) 13.0 - 17.0 g/dL   HCT 35.4 (L) 39.0 - 52.0 %   MCV 91.5 78.0 - 100.0 fL   MCH 31.0 26.0 - 34.0 pg   MCHC 33.9 30.0 - 36.0 g/dL   RDW 13.7 11.5 - 15.5 %   Platelets 144 (L) 150 - 400 K/uL    Comment: Performed at [facility], [place] [address], [place], [place] [address]  Comprehensive metabolic panel     Status: Abnormal   Collection Time: 11/21/17  7:05 PM  Result Value Ref Range   Sodium 141 135 - 145 mmol/L   Potassium 3.5 3.5 - 5.1 mmol/L   Chloride 100 98 - 111 mmol/L   CO2 25 22 - 32 mmol/L   Glucose, Bld 87 70 - 99 mg/dL   BUN 48 (H) 8 - 23 mg/dL   Creatinine, Ser 7.99 (H) 0.61 - 1.24 mg/dL   Calcium 8.8 (L) 8.9 - 10.3 mg/dL   Total Protein 7.1 6.5 - 8.1 g/dL   Albumin 3.5 3.5 - 5.0 g/dL   AST 17 15 - 41 U/L   ALT 16 0 - 44 U/L   Alkaline Phosphatase 79 38 - 126 U/L   Total Bilirubin 0.6 0.3 - 1.2 mg/dL   GFR calc non Af Amer 6 (L) >60 mL/min   GFR calc Af Amer 7 (L) >60 mL/min    Comment: (NOTE) The eGFR has been calculated using the CKD EPI  equation. This calculation has not been validated in all clinical situations. eGFR's persistently <60 mL/min signify possible Chronic Kidney Disease.    Anion gap 16 (H) 5 - 15    Comment: Performed at [facility], [place] [address] [place], [place] [address]  T4, free     Status: None   Collection Time: 11/21/17  7:05 PM  Result Value Ref Range   Free T4 0.93 0.82 - 1.77 ng/dL    Comment: (NOTE) Biotin ingestion may interfere with free T4 tests. If the results are inconsistent with the TSH level, previous test results, or the clinical presentation, then consider biotin interference. If needed, order repeat testing after stopping biotin. Performed at [facility], [place] [address], [place], [place] [address]   CK     Status: None   Collection Time: 11/21/17  7:05 PM  Result Value Ref Range   Total CK 83 49 - 397 U/L    Comment: Performed at [facility], [place] [address], [place], [place] [address]  Ammonia     Status: None   Collection Time: 11/21/17  8:08 PM  Result Value Ref Range   Ammonia 21 9 - 35 umol/L    Comment: Performed  at [facility], [place] [address] [place], [place] [address]  TSH     Status: None   Collection Time: 11/21/17  8:08 PM  Result Value Ref Range   TSH 2.533 0.350 - 4.500 uIU/mL    Comment: Performed by a 3rd Generation assay with a functional sensitivity of <=0.01 uIU/mL. Performed at [facility], [place] [address], [place], [place] [address]   Vitamin B12     Status: None   Collection Time: 11/21/17  8:08 PM  Result Value Ref Range   Vitamin B-12 223 180 - 914 pg/mL    Comment: (NOTE) This assay is not validated for testing neonatal or myeloproliferative syndrome specimens for Vitamin B12 levels. Performed at [facility], [place] [address] [place], [place] [address]   Glucose, capillary     Status: None   Collection Time: 11/21/17  8:15 PM  Result Value Ref Range   Glucose-Capillary 75 70 - 99 mg/dL   Comment 1  Notify RN    Comment 2 Document in Chart   Glucose, capillary     Status: Abnormal   Collection Time: 11/21/17 11:55 PM  Result Value Ref Range   Glucose-Capillary 143 (H) 70 - 99 mg/dL   Comment 1 Notify RN    Comment 2 Document in Chart   CBC     Status: Abnormal   Collection Time: 11/22/17  3:22 AM  Result Value Ref Range   WBC 6.9 4.0 - 10.5 K/uL   RBC 3.72 (L) 4.22 - 5.81 MIL/uL   Hemoglobin 11.4 (L) 13.0 - 17.0 g/dL   HCT 34.4 (L) 39.0 - 52.0 %   MCV 92.5 78.0 - 100.0 fL   MCH 30.6 26.0 - 34.0 pg   MCHC 33.1 30.0 - 36.0 g/dL   RDW 14.1 11.5 - 15.5 %   Platelets 148 (L) 150 - 400 K/uL    Comment: Performed at [facility], [place] [address] [place], [place] [address]  Basic metabolic panel     Status: Abnormal   Collection Time: 11/22/17  3:22 AM  Result Value Ref Range   Sodium 142 135 - 145 mmol/L   Potassium 3.7 3.5 - 5.1 mmol/L   Chloride 102 98 - 111 mmol/L   CO2 25 22 - 32 mmol/L   Glucose, Bld 52 (L) 70 - 99 mg/dL   BUN 48 (H) 8 - 23 mg/dL   Creatinine, Ser 8.22 (H) 0.61 - 1.24 mg/dL   Calcium 8.7 (L) 8.9 - 10.3 mg/dL   GFR calc non Af Amer 6 (L) >60 mL/min   GFR calc Af Amer 7 (L) >60 mL/min    Comment: (NOTE) The eGFR has been calculated using the CKD EPI equation. This calculation has not been validated in all clinical situations. eGFR's persistently <60 mL/min signify possible Chronic Kidney Disease.    Anion gap 15 5 - 15    Comment: Performed at [facility] [address] [place], [place] [address]  Lipid panel     Status: Abnormal   Collection Time: 11/22/17  3:22 AM  Result Value Ref Range   Cholesterol 183 0 - 200 mg/dL   Triglycerides 285 (H) <150 mg/dL   HDL 27 (L) >40 mg/dL   Total CHOL/HDL Ratio 6.8 RATIO   VLDL 57 (H) 0 - 40 mg/dL   LDL Cholesterol 99 0 - 99 mg/dL    Comment:        Total Cholesterol/HDL:CHD Risk  Coronary Heart Disease Risk Table                     Men   Women  1/2 Average Risk   3.4   3.3  Average Risk        5.0   4.4  2 X Average Risk   9.6   7.1  3 X Average Risk  23.4   11.0        Use the calculated Patient Ratio above and the CHD Risk Table to determine the patient's CHD Risk.        ATP III CLASSIFICATION (LDL):  <100     mg/dL   Optimal  100-129  mg/dL   Near or Above                    Optimal  130-159  mg/dL   Borderline  160-189  mg/dL   High  >190     mg/dL   Very High Performed at [facility] [address] [place], [place] [address]   Glucose, capillary     Status: Abnormal   Collection Time: 11/22/17  4:02 AM  Result Value Ref Range   Glucose-Capillary 156 (H) 70 - 99 mg/dL   Comment 1 Notify RN    Comment 2 Document in Chart   Glucose, capillary     Status: Abnormal   Collection Time: 11/22/17  7:58 AM  Result Value Ref Range   Glucose-Capillary 109 (H) 70 - 99 mg/dL   Comment 1 Notify RN    Comment 2 Document in Chart     ROS: see hpi for positives   Physical Exam: Vitals:   11/22/17 0540 11/22/17 0736  BP: (!) 154/62 (!) 182/67  Pulse: 65 70  Resp: 17 16  Temp: 97.8 F (36.6 C) 99.3 F (37.4 C)  SpO2: 95% 96%   General: Alert , elderly WM NAD, Pleasantly confused, HEENT: , eomi, non icteric , mm dry  Neck: no jvd, supple, no carotid bruits Heart: RRR, no mur, rb or gallop Lungs: CTA bilat nonlabored breathing  Abdomen: BS pos , soft , NT, ND  Extremities: no pedal edema  Skin: no overt rash , lower legs excoriations  Neuro: Oriented to mch, person ,not date, walking with PT in [place], follows directions  appropriately   Dialysis Access: Pos bruit RUA AVF   Dialysis Orders: [facility]: [facility] . 4hr UF Proile4  EDW 80kg ( left 79.5 kg last hd  And htn pre.post /    2k, 2.25 CA   Heparin none . Access RUA AVF     Mircera 4mg q 2 wks ( last given 11/07/17)  Last op hgb 11.0 (8/  /19)    Assessment/Plan 1. ESRD -  HD today  And tomorrow to keep on TTS schedule  2. Acute Encephalopathy with  Dysarthria / Neuro eval  Toxic  metabolic Encephalopathy /Bernette MayersUrgency  /Elizabeth Sauerfor stroke - Neuro team seeing  3. Hypertension/volume  -  HD today , by wt 81.2  Only 1.2 kg > edw , doses not appear vol overloaded , no cxr  (did leave below edw last  op hd) , attempt  2 l uf on HD avoiding big drop on hd / bp meds  4. Anemia  - hgb 11.4 no esa  Monitor trend hgb  5. Metabolic bone disease -  No vit d , binder with meals when po cleared  6.  HO CVA  7. HO DM type 2 - Carb mod renal diet/ meds per admit / hgb A1c =9.1 on admit   Ernest Haber, PA-C [place] [phone] 11/22/2017, 10:35 AM

## 2017-11-22 NOTE — Progress Notes (Signed)
Patient and wife expressed concern over there inability to afford his insulin they said that they have [employer] and they would not allow them to use coupons when purchasing medication.  I saw were Rubie Maid made some recommendations, perhaps she could check into any options that they may be able qualify for.

## 2017-11-22 NOTE — Progress Notes (Signed)
Patient off floor for dialysis.

## 2017-11-22 NOTE — Progress Notes (Addendum)
Patient has had several SBP reading of over 180 today CT and MRI negative for CVA but possible TIA, LKW was over 2 days ago so permissive hypertension? Alerted attending to this situation, will continue to monitor.  Attending wrote for hydralazine 5 mg.

## 2017-11-22 NOTE — Progress Notes (Signed)
Rehab Admissions Coordinator Note:  Patient was screened by Cleatrice Burke for appropriateness for an Inpatient Acute Rehab Consult per PT recommendation.   At this time, we are recommending Inpatient Rehab consult if pt would like to be considered for admit. Please advise/  Cleatrice Burke 11/22/2017, 1:26 PM  I can be reached at [phone].

## 2017-11-22 NOTE — Procedures (Signed)
ELECTROENCEPHALOGRAM REPORT   Patient: Alexander Whitaker       Room #: 2I29N EEG No. ID: [id number] Age: 67 y.o.        Sex: male Referring Physician: Arrien Report Date:  11/22/2017        Interpreting Physician: Alexis Goodell  History: [name] is an 67 y.o. male with history of ESRD presenting with altered mental status  Medications:  ASA, Insulin  Conditions of Recording:  This is a 21 channel routine scalp EEG performed with bipolar and monopolar montages arranged in accordance to the international 10/20 system of electrode placement. One channel was dedicated to EKG recording.  The patient is in the awake and drowsy states.  Description:  The waking background activity consists of a low voltage, symmetrical, fairly well organized, 8 Hz alpha activity, seen from the parieto-occipital and posterior temporal regions.  Low voltage fast activity, poorly organized, is seen anteriorly and is at times superimposed on more posterior regions.  A mixture of theta and alpha rhythms are seen from the central and temporal regions. The patient drowses with slowing to irregular, low voltage theta and beta activity.   Stage II sleep is not obtained. No epileptiform activity is noted.   Hyperventilation and intermittent photic stimulation were not performed.  IMPRESSION: Normal electroencephalogram, awake and drowsy. There are no focal lateralizing or epileptiform features.   Alexis Goodell, MD Neurology [phone] 11/22/2017, 12:46 PM

## 2017-11-22 NOTE — Progress Notes (Signed)
SLP Cancellation Note  Patient Details Name: Alexander Whitaker MRN: [id number] DOB: 09-26-1950   Cancelled treatment:       Reason Eval/Treat Not Completed: Patient at procedure or test/unavailable. Pt currently off unit. Will continue efforts.  Jaslyne Beeck B. Quentin [facility], CCC-SLP Speech Language Pathologist [phone]  Shonna Chock 11/22/2017, 9:58 AM

## 2017-11-22 NOTE — Progress Notes (Signed)
EEG complete - results pending 

## 2017-11-22 NOTE — Progress Notes (Addendum)
Subjective: No complaints at this time.  Wife states that she feels that he is gotten a little better.  Talked to the renal PA who states that he missed his dialysis yesterday and will be getting dialysis today.  Exam: Vitals:   11/22/17 0540 11/22/17 0736  BP: (!) 154/62 (!) 182/67  Pulse: 65 70  Resp: 17 16  Temp: 97.8 F (36.6 C) 99.3 F (37.4 C)  SpO2: 95% 96%    Physical Exam   HEENT-  Normocephalic, no lesions, without obvious abnormality.  Normal external eye and conjunctiva.   Extremities- Warm, dry and intact Musculoskeletal-no joint tenderness, deformity or swelling Skin-warm and dry, no hyperpigmentation,    Neuro:  Mental Status: Patient is alert able to follow simple commands but difficult commands was difficult for him.  He was able to name my thumb and my small finger.  He was able to name my watch.  He was able to recognize his wife was next to him.  I did not notice any dysarthria or aphasia. Cranial Nerves: II:  Visual fields grossly normal,  III,IV, VI: ptosis not present, extra-ocular motions intact bilaterally pupils equal, round, reactive to light and accommodation V,VII: smile symmetric, facial light touch sensation normal bilaterally VIII: hearing normal bilaterally IX,X: uvula rises midline XI: bilateral shoulder shrug XII: midline tongue extension Motor: Right : Upper extremity   5/5    Left:     Upper extremity   5/5  Lower extremity   5/5     Lower extremity   5/5 With hands held out straight for quite some time I did notice that he had mild asterixis Neck is supple Sensory: Pinprick and light touch intact throughout, bilaterally--decreased sensation to light touch and temperature from feet to midcalf. Deep Tendon Reflexes: 2+ and symmetric throughout with the exception of Achilles Plantars: Right: downgoing   Left: downgoing Cerebellar: normal finger-to-nose Gait: Not tested    Medications:  Scheduled: . aspirin  300 mg Rectal Daily    Or  . aspirin  325 mg Oral Daily  . heparin  5,000 Units Subcutaneous Q8H  . insulin aspart  0-9 Units Subcutaneous Q4H   Continuous: . sodium chloride     YJE:[id number] **OR** acetaminophen (TYLENOL) oral liquid 160 mg/5 mL **OR** acetaminophen  Pertinent Labs/Diagnostics: Ammonia 21 TSH 2.533 B12 223 at this point would like it to be greater than 600 thus he will likely need B12 supplements EEG 2D echo LDL 99 A1c 9.1 Creatinine 7.99 as of yesterday BUN 48 as of yesterday  Mr Brain Wo Contrast   Result Date: 11/22/2017 IMPRESSION: 1. No acute intracranial abnormality identified. 2. Small chronic lacunar infarcts within left putamen and posterior limb of internal capsule. 3. Mild progression of volume loss from 2015. Electronically Signed   By: Kristine Garbe M.D.   On: 11/22/2017 03:16     Etta Quill PA-C [facility] [phone]   Assessment: 67 year old male with type 2 diabetes, restless leg syndrome, hypothyroidism, hypertension, headache, end-stage renal disease requiring hemodialysis, diabetic peripheral neuropathy, CVA in the past.  Patient presented to the hospital with altered mental status after missing hemodialysis yesterday. Patient was fine around 8am, however wife found him on the floor, confused. Today patient is more alert and close to baseline. MRI brain obtained:  negative for acute stroke.   Acute confusion - Syncope vs Seizure Vs Metabolic encephalopathy vs HTN emergency    Recommendations:  - EEG pending, will start seizure medications if epileptiform activity is seen. -  Holter monitor to check for arrythmias, check Echocardiogram  - Hemodialysis which is planned for today - PT/OT eval - BP control, gradual reduction in BP to normotension      11/22/2017, 10:36 AM

## 2017-11-22 NOTE — Progress Notes (Signed)
Inpatient Diabetes Program Recommendations  AACE/ADA: New Consensus Statement on Inpatient Glycemic Control (2015)  Target Ranges:  Prepandial:   less than 140 mg/dL      Peak postprandial:   less than 180 mg/dL (1-2 hours)      Critically ill patients:  140 - 180 mg/dL   Lab Results  Component Value Date   GLUCAP 228 (H) 11/22/2017   HGBA1C 9.1 (H) 11/21/2017    Review of Glycemic ControlResults for THEOTIS, GERDEMAN (MRN [id number]) as of 11/22/2017 12:06  Ref. Range 11/21/2017 20:15 11/21/2017 23:55 11/22/2017 04:02 11/22/2017 07:58 11/22/2017 11:40  Glucose-Capillary Latest Ref Range: 70 - 99 mg/dL 75 143 (H) 156 (H) 109 (H) 228 (H)    Diabetes history: DM 2 Outpatient Diabetes medications: Levemir 10 units q 12 noon Current orders for Inpatient glycemic control:  Novolog sensitive q 4 hours   Inpatient Diabetes Program Recommendations:    Note that patient is on PO diet.  Consider changing Novolog correction to tid with meals and HS scale since patient is eating.  Also may consider restarting Levemir 5 units daily (this is 1/2 of home dose).    Thanks,  Adah Perl, RN, BC-ADM Inpatient Diabetes Coordinator Pager [phone] (8a-5p)

## 2017-11-22 NOTE — Evaluation (Signed)
Physical Therapy Evaluation Patient Details Name: Alexander Whitaker MRN: [id number] DOB: [birth year] Today's Date: 11/22/2017   History of Present Illness  Alexander Whitaker is a 67yo M with significant PMH of HTN, neuropathy, hypothyoidism, DM, CKD stage 3 on HD, h/o CVA and now preseenting with altered mental status and weakness. MRI showing small chronic lacunar infarcts within left putamen and posterior limb of internal capsule.   Clinical Impression  At baseline, patient is independent and a Hydrographic surveyor. Currently, presenting with decreased functional mobility secondary to cognitive impairments, decreased balance, and diminished activity tolerance. Oriented to self, but not place and time consistently and has decreased short term memory. Displays poor awareness of deficits. Ambulating 100 feet with no assistive device and minimal assistance provided. Presents as a high fall risk based on decreased safety awareness, gait speed and balance deficits. Recommending CIR to maximize functional independence but will continue to reassess.    Follow Up Recommendations CIR;Supervision/Assistance - 24 hour    Equipment Recommendations  Other (comment)(TBD)    Recommendations for Other Services Rehab consult     Precautions / Restrictions Precautions Precautions: Fall Restrictions Weight Bearing Restrictions: No      Mobility  Bed Mobility Overal bed mobility: Needs Assistance Bed Mobility: Supine to Sit;Sit to Supine     Supine to sit: Min assist Sit to supine: Min assist   General bed mobility comments: HOB elevated and rails used. seeking handheld assist  Transfers Overall transfer level: Needs assistance Equipment used: None Transfers: Sit to/from Stand Sit to Stand: Min assist         General transfer comment: increased knee flexion and posterior lean on initial stand  Ambulation/Gait Ambulation/Gait assistance: Min assist Gait Distance (Feet): 100 Feet Assistive  device: 1 person hand held assist Gait Pattern/deviations: Step-through pattern;Drifts right/left;Narrow base of support;Scissoring;Decreased stride length Gait velocity: decr   General Gait Details: patient requiring min assist for balance. demonstrates occasional scissoring, drifting right/left especially with distractions or dual tasking  Stairs            Wheelchair Mobility    Modified Rankin (Stroke Patients Only) Modified Rankin (Stroke Patients Only) Pre-Morbid Rankin Score: Slight disability Modified Rankin: Moderately severe disability     Balance Overall balance assessment: Needs assistance Sitting-balance support: Feet supported;No upper extremity supported Sitting balance-Leahy Scale: Fair     Standing balance support: No upper extremity supported;During functional activity Standing balance-Leahy Scale: Fair                               Pertinent Vitals/Pain Pain Assessment: No/denies pain    Home Living Family/patient expects to be discharged to:: Private residence Living Arrangements: Spouse/significant other Available Help at Discharge: Family Type of Home: House Home Access: Stairs to enter Entrance Stairs-Rails: Psychiatric nurse of Steps: 6 Home Layout: One level Home Equipment: Shower seat - built in;Grab bars - tub/shower      Prior Function Level of Independence: Independent         Comments: drives, reports no hobbies     Hand Dominance   Dominant Hand: Right    Extremity/Trunk Assessment   Upper Extremity Assessment Upper Extremity Assessment: Overall WFL for tasks assessed    Lower Extremity Assessment Lower Extremity Assessment: RLE deficits/detail;LLE deficits/detail RLE Deficits / Details: 5/5 LLE Deficits / Details: 5/5    Cervical / Trunk Assessment Cervical / Trunk Assessment: Normal  Communication   Communication: No difficulties  Cognition Arousal/Alertness: Awake/alert Behavior  During Therapy: WFL for tasks assessed/performed Overall Cognitive Status: Impaired/Different from baseline Area of Impairment: Memory;Orientation;Awareness;Safety/judgement;Problem solving                 Orientation Level: Time;Place   Memory: Decreased short-term memory   Safety/Judgement: Decreased awareness of safety;Decreased awareness of deficits Awareness: Emergent Problem Solving: Decreased initiation;Slow processing        General Comments      Exercises     Assessment/Plan    PT Assessment Patient needs continued PT services  PT Problem List Decreased activity tolerance;Decreased balance;Decreased mobility;Decreased coordination;Decreased safety awareness;Decreased cognition       PT Treatment Interventions Gait training;Stair training;Functional mobility training;Therapeutic activities;Therapeutic exercise;Balance training;Patient/family education    PT Goals (Current goals can be found in the Care Plan section)  Acute Rehab PT Goals Patient Stated Goal: "go home tomorrow" PT Goal Formulation: With patient/family Time For Goal Achievement: 12/06/17 Potential to Achieve Goals: Good    Frequency Min 3X/week   Barriers to discharge        Co-evaluation               AM-PAC PT "6 Clicks" Daily Activity  Outcome Measure Difficulty turning over in bed (including adjusting bedclothes, sheets and blankets)?: None Difficulty moving from lying on back to sitting on the side of the bed? : Unable Difficulty sitting down on and standing up from a chair with arms (e.g., wheelchair, bedside commode, etc,.)?: Unable Help needed moving to and from a bed to chair (including a wheelchair)?: A Little Help needed walking in hospital room?: A Little Help needed climbing 3-5 steps with a railing? : A Little 6 Click Score: 15    End of Session Equipment Utilized During Treatment: Gait belt Activity Tolerance: Patient tolerated treatment well Patient left: in  bed;with call bell/phone within reach;with bed alarm set;with family/visitor present Nurse Communication: Mobility status PT Visit Diagnosis: Unsteadiness on feet (R26.81);Difficulty in walking, not elsewhere classified (R26.2)    Time: 2919-1660 PT Time Calculation (min) (ACUTE ONLY): 21 min   Charges:   PT Evaluation $PT Eval Moderate Complexity: 1 Mod          Ellamae Sia, PT, DPT Acute Rehabilitation Services  Pager: [phone]   Willy Eddy 11/22/2017, 1:06 PM

## 2017-11-23 DIAGNOSIS — I10 Essential (primary) hypertension: Secondary | ICD-10-CM

## 2017-11-23 DIAGNOSIS — R2689 Other abnormalities of gait and mobility: Secondary | ICD-10-CM

## 2017-11-23 DIAGNOSIS — N186 End stage renal disease: Secondary | ICD-10-CM

## 2017-11-23 DIAGNOSIS — R471 Dysarthria and anarthria: Secondary | ICD-10-CM

## 2017-11-23 LAB — GLUCOSE, CAPILLARY
GLUCOSE-CAPILLARY: 366 mg/dL — AB (ref 70–99)
Glucose-Capillary: 145 mg/dL — ABNORMAL HIGH (ref 70–99)
Glucose-Capillary: 189 mg/dL — ABNORMAL HIGH (ref 70–99)
Glucose-Capillary: 199 mg/dL — ABNORMAL HIGH (ref 70–99)

## 2017-11-23 LAB — BASIC METABOLIC PANEL
Anion gap: 11 (ref 5–15)
BUN: 24 mg/dL — AB (ref 8–23)
CO2: 31 mmol/L (ref 22–32)
CREATININE: 5.19 mg/dL — AB (ref 0.61–1.24)
Calcium: 8.5 mg/dL — ABNORMAL LOW (ref 8.9–10.3)
Chloride: 98 mmol/L (ref 98–111)
GFR calc Af Amer: 12 mL/min — ABNORMAL LOW (ref >60)
GFR, EST NON AFRICAN AMERICAN: 10 mL/min — AB (ref >60)
GLUCOSE: 159 mg/dL — AB (ref 70–99)
Potassium: 4 mmol/L (ref 3.5–5.1)
Sodium: 140 mmol/L (ref 135–145)

## 2017-11-23 LAB — CBC
HEMATOCRIT: 34.3 % — AB (ref 39.0–52.0)
HEMOGLOBIN: 11.7 g/dL — AB (ref 13.0–17.0)
MCH: 30.7 pg (ref 26.0–34.0)
MCHC: 34.1 g/dL (ref 30.0–36.0)
MCV: 90 fL (ref 78.0–100.0)
Platelets: 166 10*3/uL (ref 150–400)
RBC: 3.81 MIL/uL — ABNORMAL LOW (ref 4.22–5.81)
RDW: 13.5 % (ref 11.5–15.5)
WBC: 5.7 10*3/uL (ref 4.0–10.5)

## 2017-11-23 MED ORDER — ONDANSETRON HCL 4 MG PO TABS
4.0000 mg | ORAL_TABLET | ORAL | Status: DC | PRN
  Administered 2017-11-24: 4 mg via ORAL
  Filled 2017-11-23: qty 1

## 2017-11-23 MED ORDER — HEPARIN SODIUM (PORCINE) 1000 UNIT/ML DIALYSIS
1000.0000 [IU] | INTRAMUSCULAR | Status: DC | PRN
  Filled 2017-11-23: qty 1

## 2017-11-23 MED ORDER — AMLODIPINE BESYLATE 10 MG PO TABS
10.0000 mg | ORAL_TABLET | Freq: Every day | ORAL | Status: DC
  Administered 2017-11-24: 10 mg via ORAL
  Filled 2017-11-23: qty 1

## 2017-11-23 MED ORDER — HYDRALAZINE HCL 25 MG PO TABS
25.0000 mg | ORAL_TABLET | Freq: Three times a day (TID) | ORAL | Status: DC
  Administered 2017-11-23 – 2017-11-24 (×3): 25 mg via ORAL
  Filled 2017-11-23 (×3): qty 1

## 2017-11-23 MED ORDER — SODIUM CHLORIDE 0.9 % IV SOLN: 100.0000 mL | INTRAVENOUS | Status: DC | PRN

## 2017-11-23 MED ORDER — ALTEPLASE 2 MG IJ SOLR
2.0000 mg | Freq: Once | INTRAMUSCULAR | Status: DC | PRN
  Filled 2017-11-23: qty 2

## 2017-11-23 MED ORDER — LIDOCAINE-PRILOCAINE 2.5-2.5 % EX CREA
1.0000 "application " | TOPICAL_CREAM | CUTANEOUS | Status: DC | PRN
  Filled 2017-11-23: qty 5

## 2017-11-23 MED ORDER — AMLODIPINE BESYLATE 5 MG PO TABS: 5.0000 mg | ORAL_TABLET | Freq: Once | ORAL | Status: DC

## 2017-11-23 MED ORDER — LIDOCAINE HCL (PF) 1 % IJ SOLN: 5.0000 mL | INTRAMUSCULAR | Status: DC | PRN

## 2017-11-23 MED ORDER — CHLORHEXIDINE GLUCONATE CLOTH 2 % EX PADS
6.0000 | MEDICATED_PAD | Freq: Every day | CUTANEOUS | Status: DC
  Administered 2017-11-23 – 2017-11-24 (×2): 6 via TOPICAL

## 2017-11-23 MED ORDER — PENTAFLUOROPROP-TETRAFLUOROETH EX AERO: 1.0000 "application " | INHALATION_SPRAY | CUTANEOUS | Status: DC | PRN

## 2017-11-23 NOTE — Progress Notes (Signed)
Interim note   Normal electroencephalogram: There are no focal lateralizing or epileptiform features. Low suspicion this was seizure, also with normal EEG will not start AED. If patient has another event of passing out/ sudden onset confusion, will need to follow up Neurology as outpatient.

## 2017-11-23 NOTE — Progress Notes (Signed)
Inpatient Diabetes Program Recommendations  AACE/ADA: New Consensus Statement on Inpatient Glycemic Control (2015)  Target Ranges:  Prepandial:   less than 140 mg/dL      Peak postprandial:   less than 180 mg/dL (1-2 hours)      Critically ill patients:  140 - 180 mg/dL   Lab Results  Component Value Date   GLUCAP 366 (H) 11/23/2017   HGBA1C 9.1 (H) 11/21/2017    Review of Glycemic ControlResults for Alexander Whitaker, Alexander Whitaker (MRN [id number]) as of 11/23/2017 13:32  Ref. Range 11/22/2017 21:16 11/22/2017 23:47 11/23/2017 04:10 11/23/2017 08:13 11/23/2017 11:23  Glucose-Capillary Latest Ref Range: 70 - 99 mg/dL 330 (H) 362 (H) 145 (H) 189 (H) 366 (H)    Diabetes history: Type 2 DM  Outpatient Diabetes medications: Levemir 10 units at 12 noon Current orders for Inpatient glycemic control:  Novolog sensitive q 4 hours  Inpatient Diabetes Program Recommendations:    Please consider adding Levemir 8 units daily. Also consider changing Novolog correction to tid with meals and HS.  Please also add Novolog 2 units tid with meals (hold if patient eats less than 50%).   Thanks,  Adah Perl, RN, BC-ADM Inpatient Diabetes Coordinator Pager [phone]

## 2017-11-23 NOTE — Progress Notes (Signed)
Physical Therapy Treatment Patient Details Name: Alexander Whitaker MRN: [id number] DOB: 04/26/1950 Today's Date: 11/23/2017    History of Present Illness Alexander Whitaker is a 67yo M with significant PMH of HTN, neuropathy, hypothyoidism, DM, CKD stage 3 on HD, h/o CVA and now preseenting with altered mental status and weakness. MRI showing small chronic lacunar infarcts within left putamen and posterior limb of internal capsule.     PT Comments    Patient seen for mobility progression. Pt continues to require min A for gait due to impaired balance and presents with cognitive deficits increasing risk for falls. Continue to recommend CIR for further skilled PT services to maximize independence and safety with mobility.    Follow Up Recommendations  CIR;Supervision/Assistance - 24 hour     Equipment Recommendations  Other (comment)(TBD)    Recommendations for Other Services Rehab consult     Precautions / Restrictions Precautions Precautions: Fall Restrictions Weight Bearing Restrictions: No    Mobility  Bed Mobility Overal bed mobility: Needs Assistance Bed Mobility: Sit to Supine     Supine to sit: Min assist Sit to supine: Min guard   General bed mobility comments: for safety  Transfers Overall transfer level: Needs assistance Equipment used: Rolling walker (2 wheeled) Transfers: Sit to/from Stand Sit to Stand: Min guard Stand pivot transfers: Min assist       General transfer comment: for safety  Ambulation/Gait Ambulation/Gait assistance: Min assist Gait Distance (Feet): 200 Feet   Gait Pattern/deviations: Step-through pattern;Decreased stride length;Drifts right/left Gait velocity: decreased   General Gait Details: assistance for balance especially with horizontal head turns, turning, and changes in gait speed   Stairs             Wheelchair Mobility    Modified Rankin (Stroke Patients Only)       Balance Overall balance assessment: Needs  assistance Sitting-balance support: Feet supported;No upper extremity supported Sitting balance-Leahy Scale: Fair     Standing balance support: No upper extremity supported;During functional activity Standing balance-Leahy Scale: Fair                              Cognition Arousal/Alertness: Awake/alert Behavior During Therapy: WFL for tasks assessed/performed Overall Cognitive Status: Impaired/Different from baseline Area of Impairment: Memory;Orientation;Awareness;Safety/judgement;Problem solving                 Orientation Level: Time;Place   Memory: Decreased short-term memory   Safety/Judgement: Decreased awareness of safety;Decreased awareness of deficits Awareness: Emergent Problem Solving: Decreased initiation;Slow processing        Exercises      General Comments General comments (skin integrity, edema, etc.): wife present end of session      Pertinent Vitals/Pain Pain Assessment: No/denies pain    Home Living       Type of Home: House              Prior Function            PT Goals (current goals can now be found in the care plan section) Acute Rehab PT Goals Patient Stated Goal: "to get back to normal" Progress towards PT goals: Progressing toward goals    Frequency    Min 3X/week      PT Plan Current plan remains appropriate    Co-evaluation              AM-PAC PT "6 Clicks" Daily Activity  Outcome Measure  Difficulty turning  over in bed (including adjusting bedclothes, sheets and blankets)?: None Difficulty moving from lying on back to sitting on the side of the bed? : A Lot Difficulty sitting down on and standing up from a chair with arms (e.g., wheelchair, bedside commode, etc,.)?: Unable Help needed moving to and from a bed to chair (including a wheelchair)?: A Little Help needed walking in hospital room?: A Little Help needed climbing 3-5 steps with a railing? : A Little 6 Click Score: 16    End  of Session Equipment Utilized During Treatment: Gait belt Activity Tolerance: Patient tolerated treatment well Patient left: in bed;with call bell/phone within reach;with bed alarm set;with family/visitor present Nurse Communication: Mobility status PT Visit Diagnosis: Unsteadiness on feet (R26.81);Difficulty in walking, not elsewhere classified (R26.2)     Time: 3491-7915 PT Time Calculation (min) (ACUTE ONLY): 18 min  Charges:  $Gait Training: 8-22 mins                     Earney Navy, PTA Pager: [phone]     Darliss Cheney 11/23/2017, 11:25 AM

## 2017-11-23 NOTE — Progress Notes (Signed)
Subjective:   Working with therapy currently; wife at bedside.  Feeling ok.  No issues with HD yesterday.   Objective Vital signs in last 24 hours: Vitals:   11/22/17 2019 11/22/17 2349 11/23/17 0413 11/23/17 0817  BP: (!) 181/71 (!) 159/56 (!) 177/71 (!) (P) 198/77  Pulse: 88 82 76 (P) 84  Resp:  17 18 (P) 18  Temp: 99.2 F (37.3 C) 98.9 F (37.2 C) 98.1 F (36.7 C) (P) 98.2 F (36.8 C)  TempSrc: Oral Oral Oral (P) Oral  SpO2: 95% 95% 95% (P) 98%  Weight:      Height:       Weight change: -1.1 kg  Intake/Output Summary (Last 24 hours) at 11/23/2017 2542 Last data filed at 11/22/2017 1744 Gross per 24 hour  Intake 240 ml  Output 2000 ml  Net -1760 ml    Dialysis Orders: Center:  on TTS . 4hr UF Proile4  EDW 80kg ( left 79.5 kg last hd  And htn pre.post /    2k, 2.25 CA   Heparin none . Access RUA AVF     Mircera 50mcg q 2 wks ( last given 11/07/17)  Last op hgb 11.0 (8/  /19)    Assessment/Plan 1. ESRD -  HD yesterday to make up missed Tuesday, again today to get back on TTS schedule.  2. Acute Encephalopathy with  Dysarthria / Neuro eval  Toxic metabolic Encephalopathy Bernette Mayers Urgency  /eval for stroke - w/u negative, this is being considered syncope per notes.   3. Hypertension/volume  -  HD today ,EDW 80kg but post weight yesterday 78.3kg.  Doesn't appear volume up but BP is up.  Will attempt 1.5L today.  Will need new EDW at discharge.  4. Anemia  - hgb 11.4 no esa  Monitor trend hgb  5. Metabolic bone disease -  No vit d , binder with meals when po cleared  6. HO CVA  7. HO DM type 2 - Carb mod renal diet/ meds per admit / hgb A1c =9.1 on admit  Justin Mend    Labs: Basic Metabolic Panel: Recent Labs  Lab 11/21/17 1905 11/22/17 0322 11/23/17 0311  NA 141 142 140  K 3.5 3.7 4.0  CL 100 102 98  CO2 $Re'25 25 31  'OwT$ GLUCOSE 87 52* 159*  BUN 48* 48* 24*  CREATININE 7.99* 8.22* 5.19*  CALCIUM 8.8* 8.7* 8.5*   Liver Function Tests: Recent Labs  Lab  11/21/17 1905  AST 17  ALT 16  ALKPHOS 79  BILITOT 0.6  PROT 7.1  ALBUMIN 3.5   No results for input(s): LIPASE, AMYLASE in the last 168 hours. Recent Labs  Lab 11/21/17 2008  AMMONIA 21   CBC: Recent Labs  Lab 11/21/17 1905 11/22/17 0322  WBC 6.7 6.9  HGB 12.0* 11.4*  HCT 35.4* 34.4*  MCV 91.5 92.5  PLT 144* 148*   Cardiac Enzymes: Recent Labs  Lab 11/21/17 1905  CKTOTAL 83   CBG: Recent Labs  Lab 11/22/17 1754 11/22/17 2116 11/22/17 2347 11/23/17 0410 11/23/17 0813  GLUCAP 134* 330* 362* 145* 189*    Iron Studies: No results for input(s): IRON, TIBC, TRANSFERRIN, FERRITIN in the last 72 hours. Studies/Results: Mr Brain Wo Contrast  Result Date: 11/22/2017 CLINICAL DATA:  67 y/o M; found unresponsive with continued altered mental status. EXAM: MRI HEAD WITHOUT CONTRAST TECHNIQUE: Multiplanar, multiecho pulse sequences of the brain and surrounding structures were obtained without intravenous contrast. COMPARISON:  11/21/2017 CT head and  CTA head. 11/06/2013 MRI of the head. FINDINGS: Brain: No acute infarction, hemorrhage, hydrocephalus, extra-axial collection or mass lesion. Small chronic lacunar infarcts within the left putamen and left posterior limb of internal capsule. Mild progression of volume loss of the brain. Vascular: Normal flow voids. Skull and upper cervical spine: Normal marrow signal. Sinuses/Orbits: Negative. Other: Right intra-ocular lens replacement. IMPRESSION: 1. No acute intracranial abnormality identified. 2. Small chronic lacunar infarcts within left putamen and posterior limb of internal capsule. 3. Mild progression of volume loss from 2015. Electronically Signed   By: Kristine Garbe M.D.   On: 11/22/2017 03:16   Medications: Infusions: . sodium chloride      Scheduled Medications: . amLODipine  5 mg Oral Daily  . calcium acetate  1,334 mg Oral TID WC  . heparin  5,000 Units Subcutaneous Q8H  . insulin aspart  0-9 Units  Subcutaneous Q4H  . levothyroxine  50 mcg Oral QAC breakfast    have reviewed scheduled and prn medications.  Physical Exam: General:  Eating breakfast, talking with therapist Heart: RRR Lungs: clear Abdomen: soft Extremities: no edema Dialysis Access: RUE AVF T/B+    11/23/2017,8:37 AM  LOS: 2 days

## 2017-11-23 NOTE — Progress Notes (Addendum)
PROGRESS NOTE    Alexander Whitaker  BJS:[id number] DOB: [birth year] DOA: 11/21/2017 PCP: [name], MD    Brief Narrative:  67 year old male who presented with altered mental status and slurred speech.  Does have significant past medical history for end-stage renal disease on hemodialysis, type 2 diabetes mellitus, hypothyroidism, anemia chronic kidney disease, diastolic heart failure and hypertension. On the day of addmission patient was found down, with slurred speech. Apparently patient woke up at his normal state of health, had a shower with warm water, immediately after he walked into the kitchen, felt dizzy and lost his consciousness, unclear if he tripped with the carpet or head trauma.  On the initial physical examination blood pressure 187/75, heart rate 72, respiratory rate 18, temperature 97.7, oxygenation 100%.  His lungs were clear to auscultation bilaterally, heart S1-S2 present and rhythmic, abdomen soft nontender, no lower extremity edema.  Positive weakness on the right lower extremity.  Sodium 141, potassium 3.5, chloride 100, bicarb 25, glucose 87, BUN 48, creatinine 7.9, white count 6.7, hemoglobin 12.0, hematocrit 35.4, platelets 144.  Brain MRI with no acute intracranial abnormality, small chronic lacunar infarcts within the left putamen and posterior limb of internal capsule.  Mild progression of volume loss from 2015.   Patient was admitted to the hospital with working diagnosis of acute neurologic focal deficit, dysarthria, rule out acute CVA.   Assessment & Plan:   Active Problems:   Diabetes mellitus, type 2 ([place])   Hypothyroidism   Acute encephalopathy   Dysarthria   ESRD (end stage renal disease) ([facility])   Essential hypertension   Normocytic anemia   1. Syncope. Likely neuro-cardiogenic vasovagal episode. CVA has been ruled out, will continue neuro checks per unit orotocol, CVA and seizures have been ruled out. Patient continue to be very debilitated and  weak, physical therapy recommendations for inpatient rehab consult. Will continue to avoid polypharmacy. Patient at home on benadryl, pramipexole and hydroxyzine.   2. ESRD on HD. Tolerating well HD, clinically euvolemic. Continue Phoslo.    3. HTN. Uncontrolled hypertension with systolic 607 mmHg, will increase amlodipine to 10 mg daily and will resume hydralazine 25 mg po tid.     4. Hypothyroid. On levothyroxine.   5. T2DM. Uncontrolled glycemia, glucose cover and monitoring with insulin sliding scale, capillary glucose 330, 362, 145, 189, 366. But fasting glucose 159 today and yesterday was 52. Will continue monitoring for now to prevent hypoglycemia, patient with high risk due to ESRD.   DVT prophylaxis: heparin   Code Status:  full Family Communication: I spoke with patient's wife at the bedside and all questions were addressed.  Disposition Plan/ discharge barriers: Pending clinical improvement possible dc in am.    Consultants:   Nephrology   Neurology   Procedures:     Subjective: Patient continue to be very weak and deconditioned, has difficulty ambulating, no further syncope episodes. No nausea or vomiting, no dyspnea or chest pain.   Objective: Vitals:   11/22/17 2349 11/23/17 0413 11/23/17 0817 11/23/17 1000  BP: (!) 159/56 (!) 177/71 (!) 198/77 (!) 184/81  Pulse: 82 76 84 87  Resp: $Remo'17 18 18   'iBAjo$ Temp: 98.9 F (37.2 C) 98.1 F (36.7 C) 98.2 F (36.8 C)   TempSrc: Oral Oral Oral   SpO2: 95% 95% 98% 100%  Weight:      Height:        Intake/Output Summary (Last 24 hours) at 11/23/2017 1159 Last data filed at 11/23/2017 0956 Gross per  24 hour  Intake -  Output 2400 ml  Net -2400 ml   Filed Weights   11/22/17 0400 11/22/17 1326 11/22/17 1744  Weight: 81.2 kg 80.1 kg 78.3 kg    Examination:   General: deconditioned  Neurology: Awake and alert, non focal  E ENT: no pallor, no icterus, oral mucosa moist Cardiovascular: No JVD. S1-S2 present,  rhythmic, no gallops, rubs, or murmurs. No lower extremity edema. Pulmonary: positive breath sounds bilaterally, adequate air movement, no wheezing, rhonchi or rales. Gastrointestinal. Abdomen with no organomegaly, non tender, no rebound or guarding Skin. No rashes Musculoskeletal: no joint deformities     Data Reviewed: I have personally reviewed following labs and imaging studies  CBC: Recent Labs  Lab 11/21/17 1905 11/22/17 0322  WBC 6.7 6.9  HGB 12.0* 11.4*  HCT 35.4* 34.4*  MCV 91.5 92.5  PLT 144* 081*   Basic Metabolic Panel: Recent Labs  Lab 11/21/17 1905 11/22/17 0322 11/23/17 0311  NA 141 142 140  K 3.5 3.7 4.0  CL 100 102 98  CO2 $Re'25 25 31  'gFJ$ GLUCOSE 87 52* 159*  BUN 48* 48* 24*  CREATININE 7.99* 8.22* 5.19*  CALCIUM 8.8* 8.7* 8.5*   GFR: Estimated Creatinine Clearance: 13 mL/min (A) (by C-G formula based on SCr of 5.19 mg/dL (H)). Liver Function Tests: Recent Labs  Lab 11/21/17 1905  AST 17  ALT 16  ALKPHOS 79  BILITOT 0.6  PROT 7.1  ALBUMIN 3.5   No results for input(s): LIPASE, AMYLASE in the last 168 hours. Recent Labs  Lab 11/21/17 2008  AMMONIA 21   Coagulation Profile: No results for input(s): INR, PROTIME in the last 168 hours. Cardiac Enzymes: Recent Labs  Lab 11/21/17 1905  CKTOTAL 83   BNP (last 3 results) No results for input(s): PROBNP in the last 8760 hours. HbA1C: Recent Labs    11/21/17 1905  HGBA1C 9.1*   CBG: Recent Labs  Lab 11/22/17 2116 11/22/17 2347 11/23/17 0410 11/23/17 0813 11/23/17 1123  GLUCAP 330* 362* 145* 189* 366*   Lipid Profile: Recent Labs    11/22/17 0322  CHOL 183  HDL 27*  LDLCALC 99  TRIG 285*  CHOLHDL 6.8   Thyroid Function Tests: Recent Labs    11/21/17 1905 11/21/17 2008  TSH  --  2.533  FREET4 0.93  --    Anemia Panel: Recent Labs    11/21/17 2008  VITAMINB12 223      Radiology Studies: I have reviewed all of the imaging during this hospital visit  personally     Scheduled Meds: . amLODipine  5 mg Oral Daily  . calcium acetate  1,334 mg Oral TID WC  . Chlorhexidine Gluconate Cloth  6 each Topical Q0600  . heparin  5,000 Units Subcutaneous Q8H  . insulin aspart  0-9 Units Subcutaneous Q4H  . levothyroxine  50 mcg Oral QAC breakfast   Continuous Infusions: . sodium chloride       LOS: 2 days        Taksh Hjort Gerome Apley, MD [facility] Pager [phone]

## 2017-11-23 NOTE — Progress Notes (Signed)
Patient is becoming more alert and now having issue with restless leg syndrome he has been treated for this and prescribed pramipexole (Mirapex) .5 mg not sure of the frequency. If he could have this added to his PRN medications he would be able to sleep better.

## 2017-11-23 NOTE — Evaluation (Addendum)
Speech Language Pathology Evaluation Patient Details Name: Alexander Whitaker MRN: [id number] DOB: [birth year] Today's Date: 11/23/2017 Time: 0850-0920 SLP Time Calculation (min) (ACUTE ONLY): 30 min  Problem List:  Patient Active Problem List   Diagnosis Date Noted  . Acute encephalopathy 11/21/2017  . Dysarthria 11/21/2017  . ESRD (end stage renal disease) ([place]) 11/21/2017  . Essential hypertension 11/21/2017  . Normocytic anemia 11/21/2017  . Acute renal failure superimposed on chronic kidney disease ([place]) 03/10/2016  . Acute diastolic congestive heart failure ([place]) 03/10/2016  . Diabetes mellitus, type 2 ([place]) 03/10/2016  . Malignant hypertension 03/10/2016  . Hypothyroidism 03/10/2016  . Tobacco use disorder 03/10/2016  . Acute renal failure (ARF) ([place]) 03/10/2016   Past Medical History:  Past Medical History:  Diagnosis Date  . Anemia   . Arthritis    "probably in his joints" (11/21/2017)  . CHF (congestive heart failure) ([place]) ~ 2017  . CKD stage 3 due to type 2 diabetes mellitus ([place])    stage 5 Dialysis T/Th/Sa  . CVA (cerebral vascular accident) [facility]) 2007   wife denies residual on 11/21/2017  . Diabetic peripheral neuropathy ([place])   . Dyspnea   . ESRD (end stage renal disease) on dialysis ([place])    "[place]; TTS" (11/21/2017)  . GERD (gastroesophageal reflux disease)    has in the past  . Headache    since going on dialysis  . Hypertension   . Hypothyroidism   . PONV (postoperative nausea and vomiting)   . Restless legs   . Type II diabetes mellitus ([place])    Past Surgical History:  Past Surgical History:  Procedure Laterality Date  . AV FISTULA PLACEMENT Right 10/07/2016   Procedure: RIGHT BRACHIOCEPHALIC ARTERIOVENOUS (AV) FISTULA CREATION;  Surgeon: Angelia Mould, MD;  Location: [facility];  Service: Vascular;  Laterality: Right;  . BACK SURGERY    . CATARACT EXTRACTION W/ INTRAOCULAR LENS IMPLANT Left   . CIRCUMCISION  2015  . HERNIA REPAIR  2016    "stomach"  . POSTERIOR LUMBAR FUSION  1990s?   "has bolts and rods in there"   HPI:  67 year old male admitted 11/21/17 with AMS, dysarthria. PMH: ESRD (HD TThS), DM, hypothyroidism, chronic anemia, dCHF, essential HTN, CVA with residual RLE weakness, GERD, CKD3. MRI = no acute abnormality, small chronic lacunar infarcts in left putamen and posterior limb of internal capsule.   Assessment / Plan / Recommendation Clinical Impression  Pt presents with significant cognitive lingustic dysfunction - with scoring on MOCA 7.2 showing severe impairments 3/25 (did not administer written portion).  Pt is able to communicate/verbalize his basic needs (I will need to use the bathroom and then they will take me to dialysis).  Pt demonstrated functionally severe sustained attention difficulties requiring frequent repetition of directions/commands.  He was unable to state current year - even with choice of 2.  Stated current president is Tawni Pummel but then realized he was incorrect and required phonemic cues to state current president.  SLP is concerned pt's symptoms may be consistent with progressive cognitive decline.   Speech is without dysarthria.   Spouse reports pt with recent loss of pet pug and is concerned this has prompted medical issues.    In addition, pt did not sleep well last night per spouse.  Reviewed findings of [place] with patient/spouse and advised recommendations for follow up to improve sustained attention and thus function in ADLs.  Pt and wife agreeable to plan.    Informed wife of recommendation  to have pt use his call bell for needs.      SLP Assessment  SLP Recommendation/Assessment: Patient needs continued Speech Lanaguage Pathology Services SLP Visit Diagnosis: Cognitive communication deficit (R41.841)    Follow Up Recommendations  Inpatient Rehab(it pt transfers to CIR, rec follow up SLP to improve attention)    Frequency and Duration min 1 x/week  1 week      SLP  Evaluation Cognition  Overall Cognitive Status: Impaired/Different from baseline Arousal/Alertness: Awake/alert Orientation Level: Oriented to person;Disoriented to place;Disoriented to time;Disoriented to situation Attention: Focused;Sustained Focused Attention: Appears intact Sustained Attention: Impaired Sustained Attention Impairment: Verbal basic Memory: Impaired Memory Impairment: Storage deficit;Retrieval deficit;Decreased recall of new information Awareness: Impaired Awareness Impairment: Intellectual impairment Problem Solving: Impaired Problem Solving Impairment: Verbal complex Safety/Judgment: Impaired Comments: poor awareness       Comprehension  Auditory Comprehension Overall Auditory Comprehension: Impaired Yes/No Questions: Not tested Commands: Impaired One Step Basic Commands: 50-74% accurate Conversation: Simple Interfering Components: Attention;Working memory;Processing speed;Visual impairments EffectiveTechniques: Extra processing time;Increased volume;Visual/Gestural cues;Slowed speech Visual Recognition/Discrimination Discrimination: Not tested Reading Comprehension Reading Status: Within funtional limits(pt read single word "hospital" with minimal delay)    Expression Expression Primary Mode of Expression: Verbal Verbal Expression Overall Verbal Expression: Appears within functional limits for tasks assessed Level of Generative/Spontaneous Verbalization: Conversation Repetition: Impaired Level of Impairment: Sentence level Naming: Impairment(2/3 pictures named correctly) Pragmatics: No impairment Effective Techniques: Phonemic cues Non-Verbal Means of Communication: Not applicable Other Verbal Expression Comments: delay in expressions with pauses due to word finding deficits Written Expression Dominant Hand: Right   Oral / Motor  Oral Motor/Sensory Function Overall Oral Motor/Sensory Function: Within functional limits Motor Speech Overall Motor  Speech: Appears within functional limits for tasks assessed Respiration: Within functional limits Resonance: Within functional limits Articulation: Within functional limitis Intelligibility: Intelligible Motor Planning: Witnin functional limits   GO                    Macario Golds 11/23/2017, 9:52 AM  Luanna Salk, [facility] SLP [phone]

## 2017-11-23 NOTE — Progress Notes (Signed)
Occupational Therapy Treatment Patient Details Name: Alexander Whitaker MRN: [id number] DOB: [birth year] Today's Date: 11/23/2017    History of present illness Alexander Whitaker is a 67yo M with significant PMH of HTN, neuropathy, hypothyoidism, DM, CKD stage 3 on HD, h/o CVA and now preseenting with altered mental status and weakness. MRI showing small chronic lacunar infarcts within left putamen and posterior limb of internal capsule.    OT comments  Care coordination completed with SLP and PT, recommend PT complete short CIR stay. Pt with significant underlying cognitive deficits that impact his self and safety awareness, putting him at an increased fall risk. With these deficits and pt's motivation in therapy pt is a good candidate for CIR. Pt's wife works full time and he would be at home alone for long periods during the day. Pt still requiring min A during functional mobility and ADL transfers. Upon discussion, pt and wife were agreeable to short CIR stay given insurance authorization.     Follow Up Recommendations  CIR;Supervision/Assistance - 24 hour    Equipment Recommendations  None recommended by OT    Recommendations for Other Services Rehab consult;PT consult;Speech consult    Precautions / Restrictions Precautions Precautions: Fall Restrictions Weight Bearing Restrictions: No       Mobility Bed Mobility Overal bed mobility: Needs Assistance Bed Mobility: Supine to Sit;Sit to Supine     Supine to sit: Min assist Sit to supine: Min assist   General bed mobility comments: HOB elevated and rails used. seeking handheld assist  Transfers Overall transfer level: Needs assistance Equipment used: Rolling walker (2 wheeled) Transfers: Sit to/from Stand Sit to Stand: Min assist Stand pivot transfers: Min assist       General transfer comment: vc for upright posture    Balance Overall balance assessment: Needs assistance Sitting-balance support: Feet supported;No upper  extremity supported Sitting balance-Leahy Scale: Fair     Standing balance support: No upper extremity supported;During functional activity Standing balance-Leahy Scale: Fair                             ADL either performed or assessed with clinical judgement   ADL Overall ADL's : Needs assistance/impaired Eating/Feeding: Modified independent                       Toilet Transfer: Minimal Insurance claims handler Details (indicate cue type and reason): vc for UE placement/technique Toileting- Clothing Manipulation and Hygiene: Min guard;Sit to/from stand       Functional mobility during ADLs: Minimal assistance;Cueing for safety;Rolling walker General ADL Comments: min A for safety     Vision       Perception     Praxis      Cognition Arousal/Alertness: Awake/alert Behavior During Therapy: WFL for tasks assessed/performed Overall Cognitive Status: Impaired/Different from baseline Area of Impairment: Memory;Orientation;Awareness;Safety/judgement;Problem solving                 Orientation Level: Time;Place   Memory: Decreased short-term memory   Safety/Judgement: Decreased awareness of safety;Decreased awareness of deficits Awareness: Emergent Problem Solving: Decreased initiation;Slow processing          Exercises     Shoulder Instructions       General Comments      Pertinent Vitals/ Pain       Pain Assessment: No/denies pain  Home Living       Type of Home: House  Lives With: Spouse    Prior Functioning/Environment              Frequency  Min 2X/week        Progress Toward Goals  OT Goals(current goals can now be found in the care plan section)  Progress towards OT goals: Progressing toward goals  Acute Rehab OT Goals Patient Stated Goal: "to get back to normal" OT Goal Formulation: With patient/family Time For Goal Achievement: 12/01/17 Potential to  Achieve Goals: Good  Plan Discharge plan needs to be updated    Co-evaluation                 AM-PAC PT "6 Clicks" Daily Activity     Outcome Measure   Help from another person eating meals?: None Help from another person taking care of personal grooming?: None Help from another person toileting, which includes using toliet, bedpan, or urinal?: A Little Help from another person bathing (including washing, rinsing, drying)?: A Little Help from another person to put on and taking off regular upper body clothing?: None Help from another person to put on and taking off regular lower body clothing?: A Little 6 Click Score: 21    End of Session Equipment Utilized During Treatment: Rolling walker;Gait belt  OT Visit Diagnosis: Unsteadiness on feet (R26.81);Muscle weakness (generalized) (M62.81)   Activity Tolerance Patient tolerated treatment well   Patient Left in chair;with call bell/phone within reach;with chair alarm set;with family/visitor present   Nurse Communication Mobility status        Time: 3435-6861 OT Time Calculation (min): 36 min  Charges: OT General Charges $OT Visit: 1 Visit OT Treatments $Self Care/Home Management : 23-37 mins  Curtis Sites OTR/L 11/23/2017, 11:04 AM

## 2017-11-23 NOTE — Progress Notes (Addendum)
Inpatient Rehabilitation Admissions Coordinator  Patient not in need of an inpt rehab admission. Please refer to Dr. Letta Pate' consult. Recommend Home with [facility] and family support or SNF if that is not available. We will sign off at this time.  Danne Baxter, RN, MSN Rehab Admissions Coordinator [phone] 11/23/2017 8:34 PM  I notified RN CM and SW of plan this am.

## 2017-11-23 NOTE — Consult Note (Signed)
Physical Medicine and Rehabilitation Consult   Reason for Consult: Functional deficits Referring Physician: Dr. Cathlean Sauer   HPI: Alexander Whitaker is a 67 y.o. male with history of end-stage renal disease due to DM/hypertension, CVA, RLS; who was admitted on 11/21/2017 after being found on the floor with slurred speech, confusion and right-sided weakness.  CTA head negative but TPA not given as deemed outside TPA window--last seen normal the night before and patient had missed dialysis that day.  MRI brain done revealing no acute infarct and small chronic lacunar infarcts in left putamen and left posterior limb internal capsule.  2D echo showed EF of 60 to 65% with no wall abnormality.  Grade 1 diastolic dysfunction and mild calcified aortic valve.  EEG was negative for focal lateralizing or epileptiform features.  Patient with improvement in mentation past admission with question of seizures versus syncope versus metabolic encephalopathy with hypertensive emergency as cause of changes.  Speech therapy evaluation revealed significant cognitive linguistic this function and poor safety noted on PT/OT eval. IR was recommended follow-up therapy.  Wife reports patient with limited education and feels cognitive deficits baseline. She also reported recent loss of their pet and depression could be impacting current situation.  Patient required reassurance that his wife was waiting for him at his hospital room.  Patient currently in dialysis  Review of Systems  Unable to perform ROS: Mental acuity  Psychiatric/Behavioral: Positive for memory loss.      Past Medical History:  Diagnosis Date  . Anemia   . Arthritis    "probably in his joints" (11/21/2017)  . CHF (congestive heart failure) ([place]) ~ 2017  . CKD stage 3 due to type 2 diabetes mellitus ([place])    stage 5 Dialysis T/Th/Sa  . CVA (cerebral vascular accident) [facility]) 2007   wife denies residual on 11/21/2017  . Diabetic peripheral neuropathy  ([place])   . Dyspnea   . ESRD (end stage renal disease) on dialysis ([place])    "[place]; TTS" (11/21/2017)  . GERD (gastroesophageal reflux disease)    has in the past  . Headache    since going on dialysis  . Hypertension   . Hypothyroidism   . PONV (postoperative nausea and vomiting)   . Restless legs   . Type II diabetes mellitus ([place])     Past Surgical History:  Procedure Laterality Date  . AV FISTULA PLACEMENT Right 10/07/2016   Procedure: RIGHT BRACHIOCEPHALIC ARTERIOVENOUS (AV) FISTULA CREATION;  Surgeon: Angelia Mould, MD;  Location: [facility];  Service: Vascular;  Laterality: Right;  . BACK SURGERY    . CATARACT EXTRACTION W/ INTRAOCULAR LENS IMPLANT Left   . CIRCUMCISION  2015  . HERNIA REPAIR  2016   "stomach"  . POSTERIOR LUMBAR FUSION  1990s?   "has bolts and rods in there"    Family History  Problem Relation Age of Onset  . Leukemia Mother 69  . CVA Father 67    Social History: Married.  Retired Geophysicist/field seismologist reports that he quit smoking about 7 months ago. His smoking use included cigarettes. He has a 57.00 pack-year smoking history. He has never used smokeless tobacco. He reports that he drank alcohol. He reports that he does not use drugs.    Allergies  Allergen Reactions  . No Known Allergies     Medications Prior to Admission  Medication Sig Dispense Refill  . albuterol (PROVENTIL HFA;VENTOLIN HFA) 108 (90 Base) MCG/ACT inhaler Inhale 2 puffs into the lungs every  6 (six) hours as needed for wheezing or shortness of breath.    [name] amLODipine (NORVASC) 5 MG tablet Take 5 mg by mouth daily.     . calcium acetate (PHOSLO) 667 MG capsule Take 1,334 mg by mouth 3 (three) times daily with meals.    . camphor-menthol (SARNA) lotion Apply 1 application topically as needed for itching.    . clonazePAM (KLONOPIN) 2 MG tablet Take 2 mg by mouth 3 (three) times daily as needed. Restless legs and nerves  2  . diphenhydrAMINE (BENADRYL) 25 mg capsule Take 25 mg by mouth  daily as needed for itching.    . gabapentin (NEURONTIN) 100 MG capsule Take 100 mg by mouth 3 (three) times daily.    . hydrALAZINE (APRESOLINE) 25 MG tablet Take 25 mg by mouth 3 (three) times daily.    . hydrOXYzine (ATARAX/VISTARIL) 25 MG tablet Take 25 mg by mouth 2 (two) times daily as needed for itching.     . Insulin Detemir (LEVEMIR) 100 UNIT/ML Pen Inject 10 Units into the skin daily at 12 noon.    [name] levothyroxine (SYNTHROID, LEVOTHROID) 50 MCG tablet Take 50 mcg by mouth daily before breakfast.     . lidocaine-prilocaine (EMLA) cream Apply 1 application topically See admin instructions. Apply small amount to access site 1 to 2 hours before dialysis.  12  . loratadine (CLARITIN) 10 MG tablet Take 10 mg by mouth daily as needed for rhinitis.     . Melatonin 10 MG CAPS Take 10 mg by mouth at bedtime as needed (sleep).     . pramipexole (MIRAPEX) 0.5 MG tablet Take 0.5 mg by mouth at bedtime as needed (sleep).    . promethazine (PHENERGAN) 25 MG tablet Take 25 mg by mouth every 6 (six) hours as needed for nausea or vomiting.      Home: Home Living Family/patient expects to be discharged to:: Private residence Living Arrangements: Spouse/significant other Available Help at Discharge: Family Type of Home: House Home Access: Stairs to enter Secretary/administrator of Steps: 6 Entrance Stairs-Rails: Right, Left Home Layout: One level Bathroom Shower/Tub: Psychologist, counselling, Engineer, manufacturing systems: Standard Home Equipment: Information systems manager - built in, [employer] - tub/shower  Lives With: Spouse  Functional History: Prior Function Level of Independence: Independent Comments: drives, reports no hobbies Functional Status:  Mobility: Bed Mobility Overal bed mobility: Needs Assistance Bed Mobility: Sit to Supine Supine to sit: Min assist Sit to supine: Min guard General bed mobility comments: for safety Transfers Overall transfer level: Needs assistance Equipment used: Rolling  walker (2 wheeled) Transfers: Sit to/from Stand Sit to Stand: Min guard Stand pivot transfers: Min assist General transfer comment: for safety Ambulation/Gait Ambulation/Gait assistance: Min assist Gait Distance (Feet): 200 Feet Assistive device: 1 person hand held assist Gait Pattern/deviations: Step-through pattern, Decreased stride length, Drifts right/left General Gait Details: assistance for balance especially with horizontal head turns, turning, and changes in gait speed Gait velocity: decreased    ADL: ADL Overall ADL's : Needs assistance/impaired Eating/Feeding: Modified independent Grooming: Sitting, Wash/dry face, Modified independent Toilet Transfer: Minimal assistance, Medical sales representative Details (indicate cue type and reason): vc for UE placement/technique Toileting- Clothing Manipulation and Hygiene: Min guard, Sit to/from stand Functional mobility during ADLs: Minimal assistance, Cueing for safety, Rolling walker General ADL Comments: min A for safety  Cognition: Cognition Overall Cognitive Status: Impaired/Different from baseline Arousal/Alertness: Awake/alert Orientation Level: Oriented to person, Oriented to place, Oriented to situation, Disoriented to time Attention: Focused, Sustained  Focused Attention: Appears intact Sustained Attention: Impaired Sustained Attention Impairment: Verbal basic Memory: Impaired Memory Impairment: Storage deficit, Retrieval deficit, Decreased recall of new information Awareness: Impaired Awareness Impairment: Intellectual impairment Problem Solving: Impaired Problem Solving Impairment: Verbal complex Safety/Judgment: Impaired Comments: poor awareness Cognition Arousal/Alertness: Awake/alert Behavior During Therapy: WFL for tasks assessed/performed Overall Cognitive Status: Impaired/Different from baseline Area of Impairment: Memory, Orientation, Awareness, Safety/judgement, Problem solving Orientation Level: Time,  Place Memory: Decreased short-term memory Safety/Judgement: Decreased awareness of safety, Decreased awareness of deficits Awareness: Emergent Problem Solving: Decreased initiation, Slow processing   Blood pressure (!) 184/81, pulse 87, temperature 98.2 F (36.8 C), temperature source Oral, resp. rate 18, height $RemoveBe'5\' 5"'MnDoPWcyu$  (1.651 m), weight 78.3 kg, SpO2 100 %. Physical Exam  Nursing note and vitals reviewed. Constitutional: He appears well-developed and well-nourished.  Lying in bed with emesis basin.   Neurological: He is alert.  Distracted and perseverative due to nausea. Cognitive deficits with difficulty stating age, month or day. He had difficulty following two step commands.   Lungs clear Heart regular rate and rhythm Abdomen positive bowel sounds soft nontender palpation Extremities no clubbing cyanosis or edema, dialysis catheter in the right arm. Motor strength 5/5 bilateral deltoid bicep tricep grip, 4+ bilateral hip flexor knee extensor ankle dorsiflexor. Sensation reported as equal and intact in bilateral upper and lower limbs. No evidence of facial droop No evidence of dysarthria  Results for orders placed or performed during the hospital encounter of 11/21/17 (from the past 24 hour(s))  Glucose, capillary     Status: Abnormal   Collection Time: 11/22/17 11:40 AM  Result Value Ref Range   Glucose-Capillary 228 (H) 70 - 99 mg/dL  Glucose, capillary     Status: Abnormal   Collection Time: 11/22/17  5:54 PM  Result Value Ref Range   Glucose-Capillary 134 (H) 70 - 99 mg/dL  Glucose, capillary     Status: Abnormal   Collection Time: 11/22/17  9:16 PM  Result Value Ref Range   Glucose-Capillary 330 (H) 70 - 99 mg/dL   Comment 1 Notify RN    Comment 2 Document in Chart   Glucose, capillary     Status: Abnormal   Collection Time: 11/22/17 11:47 PM  Result Value Ref Range   Glucose-Capillary 362 (H) 70 - 99 mg/dL   Comment 1 Notify RN    Comment 2 Document in Chart    Basic metabolic panel     Status: Abnormal   Collection Time: 11/23/17  3:11 AM  Result Value Ref Range   Sodium 140 135 - 145 mmol/L   Potassium 4.0 3.5 - 5.1 mmol/L   Chloride 98 98 - 111 mmol/L   CO2 31 22 - 32 mmol/L   Glucose, Bld 159 (H) 70 - 99 mg/dL   BUN 24 (H) 8 - 23 mg/dL   Creatinine, Ser 5.19 (H) 0.61 - 1.24 mg/dL   Calcium 8.5 (L) 8.9 - 10.3 mg/dL   GFR calc non Af Amer 10 (L) >60 mL/min   GFR calc Af Amer 12 (L) >60 mL/min   Anion gap 11 5 - 15  Glucose, capillary     Status: Abnormal   Collection Time: 11/23/17  4:10 AM  Result Value Ref Range   Glucose-Capillary 145 (H) 70 - 99 mg/dL   Comment 1 Notify RN    Comment 2 Document in Chart   Glucose, capillary     Status: Abnormal   Collection Time: 11/23/17  8:13 AM  Result Value Ref  Range   Glucose-Capillary 189 (H) 70 - 99 mg/dL  Glucose, capillary     Status: Abnormal   Collection Time: 11/23/17 11:23 AM  Result Value Ref Range   Glucose-Capillary 366 (H) 70 - 99 mg/dL   Mr Brain Wo Contrast  Result Date: 11/22/2017 CLINICAL DATA:  67 y/o M; found unresponsive with continued altered mental status. EXAM: MRI HEAD WITHOUT CONTRAST TECHNIQUE: Multiplanar, multiecho pulse sequences of the brain and surrounding structures were obtained without intravenous contrast. COMPARISON:  11/21/2017 CT head and CTA head. 11/06/2013 MRI of the head. FINDINGS: Brain: No acute infarction, hemorrhage, hydrocephalus, extra-axial collection or mass lesion. Small chronic lacunar infarcts within the left putamen and left posterior limb of internal capsule. Mild progression of volume loss of the brain. Vascular: Normal flow voids. Skull and upper cervical spine: Normal marrow signal. Sinuses/Orbits: Negative. Other: Right intra-ocular lens replacement. IMPRESSION: 1. No acute intracranial abnormality identified. 2. Small chronic lacunar infarcts within left putamen and posterior limb of internal capsule. 3. Mild progression of volume loss  from 2015. Electronically Signed   By: Kristine Garbe M.D.   On: 11/22/2017 03:16    Assessment/Plan: Diagnosis: Altered mental status with work-up negative for seizures or stroke.  According to wife patient back at cognitive baseline. 1. Does the need for close, 24 hr/day medical supervision in concert with the patient's rehab needs make it unreasonable for this patient to be served in a less intensive setting? No 2. Co-Morbidities requiring supervision/potential complications: End-stage renal disease diabetes hypertension 3. Due to bladder management, bowel management, safety, disease management and medication administration, does the patient require 24 hr/day rehab nursing? Potentially 4. Does the patient require coordinated care of a physician, rehab nurse, PT, OT to address physical and functional deficits in the context of the above medical diagnosis(es)? No Addressing deficits in the following areas: balance, endurance, locomotion, strength, transferring and toileting 5. Can the patient actively participate in an intensive therapy program of at least 3 hrs of therapy per day at least 5 days per week? N/A 6. The potential for patient to make measurable gains while on inpatient rehab is Not applicable 7. Anticipated functional outcomes upon discharge from inpatient rehab are n/a  with PT, n/a with OT, n/a with SLP. 8. Estimated rehab length of stay to reach the above functional goals is: N/A 9. Anticipated D/C setting: Home 10. Anticipated post D/C treatments: [place] therapy 11. Overall Rehab/Functional Prognosis: good  RECOMMENDATIONS: This patient's condition is appropriate for continued rehabilitative care in the following setting: [facility] Therapy Patient has agreed to participate in recommended program. Yes patient insists on going home as soon as possible Note that insurance prior authorization may be required for reimbursement for recommended care.  Comment: Do not think patient  would complete an inpatient rehabilitation program due to cooperation and in fact should improve with his mobility sufficiently to allow home discharge with home health "I have personally performed a face to face diagnostic evaluation of this patient.  Additionally, I have reviewed and concur with the physician assistant's documentation above." Charlett Blake M.D. [facility] FAAPM&R (Sports Med, Neuromuscular Med) Diplomate Am Board of [place], PA-C 11/23/2017

## 2017-11-24 DIAGNOSIS — Z794 Long term (current) use of insulin: Secondary | ICD-10-CM

## 2017-11-24 DIAGNOSIS — E1122 Type 2 diabetes mellitus with diabetic chronic kidney disease: Secondary | ICD-10-CM

## 2017-11-24 LAB — BASIC METABOLIC PANEL
ANION GAP: 10 (ref 5–15)
BUN: 13 mg/dL (ref 8–23)
CALCIUM: 9.1 mg/dL (ref 8.9–10.3)
CO2: 30 mmol/L (ref 22–32)
Chloride: 98 mmol/L (ref 98–111)
Creatinine, Ser: 3.8 mg/dL — ABNORMAL HIGH (ref 0.61–1.24)
GFR calc Af Amer: 17 mL/min — ABNORMAL LOW (ref >60)
GFR, EST NON AFRICAN AMERICAN: 15 mL/min — AB (ref >60)
Glucose, Bld: 197 mg/dL — ABNORMAL HIGH (ref 70–99)
POTASSIUM: 4.1 mmol/L (ref 3.5–5.1)
SODIUM: 138 mmol/L (ref 135–145)

## 2017-11-24 LAB — GLUCOSE, CAPILLARY
GLUCOSE-CAPILLARY: 207 mg/dL — AB (ref 70–99)
GLUCOSE-CAPILLARY: 210 mg/dL — AB (ref 70–99)
GLUCOSE-CAPILLARY: 334 mg/dL — AB (ref 70–99)
Glucose-Capillary: 160 mg/dL — ABNORMAL HIGH (ref 70–99)

## 2017-11-24 LAB — HEPATITIS B SURFACE ANTIGEN: Hepatitis B Surface Ag: NEGATIVE

## 2017-11-24 MED ORDER — AMLODIPINE BESYLATE 10 MG PO TABS: 10.0000 mg | ORAL_TABLET | Freq: Every day | ORAL | 0 refills | Status: DC

## 2017-11-24 MED ORDER — CHLORHEXIDINE GLUCONATE CLOTH 2 % EX PADS: 6.0000 | MEDICATED_PAD | Freq: Every day | CUTANEOUS | Status: DC

## 2017-11-24 NOTE — Discharge Summary (Signed)
Physician Discharge Summary  Alexander Whitaker  VOZ:[id number]  DOB: February 07, 1951  DOA: 11/21/2017 PCP: [name], MD  Admit date: 11/21/2017 Discharge date: 11/24/2017  Admitted From: Home  Disposition: Home   Recommendations for Outpatient Follow-up:  1. Follow up with PCP in 1-2 weeks 2. Please obtain BMP/CBC in one week to monitor renal function and hemoglobin 3. Please obtain vitamin D levels. 4. Follow-up with nephrology continue hemodialysis as scheduled 5. Follow-up with neurology in 3 to 4 weeks  Home Health: PT/OT/SLP  Discharge Condition: Stable  CODE STATUS: Full Code  Diet recommendation: Heart Healthy   Brief/Interim Summary: For full details see H&P/Progress note, but in brief, Alexander Whitaker is a 67 year old male who presented with altered mental status and slurred speech. Does have significant past medical history for end-stage renal disease on hemodialysis, type 2 diabetes mellitus, hypothyroidism, anemia chronic kidney disease, diastolic heart failure and hypertension. On the day of addmission patient was founddown, withslurredspeech.Apparently patient woke up at his normal state of health, had a shower with warm water, immediately after he walked into the kitchen, felt dizzy and lost his consciousness, unclear if he tripped with the carpet or head trauma. On the initial physical examination blood pressure 187/75, heart rate 72, respiratory rate18, temperature 97.7, oxygenation 100%.His lungs wereclear to auscultation bilaterally, heart S1-S2 present and rhythmic, abdomen soft nontender, no lower extremity edema. Positive weakness on the right lower extremity.  Sodium 141, potassium 3.5, chloride 100, bicarb 25, glucose 87, BUN 48, creatinine 7.9, white count 6.7, hemoglobin 12.0, hematocrit 35.4, platelets 144.  Brain MRI with no acute intracranial abnormality, small chronic lacunar infarcts within the left putamen and posterior limb of internal capsule.   Mild progression of volume loss from 2015.   Patient was admitted to the hospital with working diagnosis of acute neurologic focal deficit, dysarthria, rule out acute CVA.  Subjective: Patient seen and examined, wife at bedside.  Continues to be weak and deconditioned, per wife mentally back to baseline, however confused on timing.  No acute events overnight.  Patient remains afebrile.  Discharge Diagnoses/Hospital Course:  Active Problems:   Diabetes mellitus, type 2 ([place])   Hypothyroidism   Acute encephalopathy   Dysarthria   ESRD (end stage renal disease) ([facility])   Essential hypertension   Normocytic anemia  1. Syncope. Likely neuro-cardiogenic vasovagal episode/acute metabolic encephalopathy. CVA and seizures have been ruled out.  Neurology was consulted, EEG was negative for seizure disorder.  Partner felt to be neurocardiogenic. Patient continue to be very debilitated and weak, physical therapy recommendations for CIR, however patient did not qualify.  Therefore SNF was recommended however patient family would like to take patient home with home health PT and 24-hour supervision.  Avoid polypharmacy Benadryl, gabapentin and opinion has been discontinued. If patient develop mental status changes or syncopal episode need to follow-up with neurology as an outpatient.  Consider check vitamin D levels.  Mental status back to baseline.  2. ESRD on HD. Tolerating well HD, clinically euvolemic. Continue Phoslo.    3. HTN.  BP above goal during hospital stay, likely from volume.  Amlodipine increase it to 10 mg daily.  Continue hydralazine 25 mg 3 times a day.   Follow-up with primary care doctor to monitor BP closely.  4. Hypothyroid. On levothyroxine.   5. T2DM.  Uncontrolled with hyperglycemia, resume home dose Lantus.  Follow-up with PCP for further insulin adjustment as needed.  All other chronic medical condition were stable during the hospitalization.  Patient was seen by  physical therapy, recommending SNF, patient family declined and wishes to take patient home with home health PT. On the day of the discharge the patient's vitals were stable, and no other acute medical condition were reported by patient.  Discharge Instructions  You were cared for by a hospitalist during your hospital stay. If you have any questions about your discharge medications or the care you received while you were in the hospital after you are discharged, you can call the unit and asked to speak with the hospitalist on call if the hospitalist that took care of you is not available. Once you are discharged, your primary care physician will handle any further medical issues. Please note that NO REFILLS for any discharge medications will be authorized once you are discharged, as it is imperative that you return to your primary care physician (or establish a relationship with a primary care physician if you do not have one) for your aftercare needs so that they can reassess your need for medications and monitor your lab values.  Discharge Instructions    Call MD for:  difficulty breathing, headache or visual disturbances   Complete by:  As directed    Call MD for:  extreme fatigue   Complete by:  As directed    Call MD for:  hives   Complete by:  As directed    Call MD for:  persistant dizziness or light-headedness   Complete by:  As directed    Call MD for:  persistant nausea and vomiting   Complete by:  As directed    Call MD for:  redness, tenderness, or signs of infection (pain, swelling, redness, odor or green/yellow discharge around incision site)   Complete by:  As directed    Call MD for:  severe uncontrolled pain   Complete by:  As directed    Call MD for:  temperature >100.4   Complete by:  As directed    Diet - low sodium heart healthy   Complete by:  As directed    Increase activity slowly   Complete by:  As directed      Allergies as of 11/24/2017      Reactions   No  Known Allergies       Medication List    STOP taking these medications   clonazePAM 2 MG tablet Commonly known as:  KLONOPIN   diphenhydrAMINE 25 mg capsule Commonly known as:  BENADRYL   gabapentin 100 MG capsule Commonly known as:  NEURONTIN   promethazine 25 MG tablet Commonly known as:  PHENERGAN     TAKE these medications   albuterol 108 (90 Base) MCG/ACT inhaler Commonly known as:  PROVENTIL HFA;VENTOLIN HFA Inhale 2 puffs into the lungs every 6 (six) hours as needed for wheezing or shortness of breath.   amLODipine 10 MG tablet Commonly known as:  NORVASC Take 1 tablet (10 mg total) by mouth daily. Start taking on:  11/25/2017 What changed:    medication strength  how much to take   calcium acetate 667 MG capsule Commonly known as:  PHOSLO Take 1,334 mg by mouth 3 (three) times daily with meals.   camphor-menthol lotion Commonly known as:  SARNA Apply 1 application topically as needed for itching.   hydrALAZINE 25 MG tablet Commonly known as:  APRESOLINE Take 25 mg by mouth 3 (three) times daily.   hydrOXYzine 25 MG tablet Commonly known as:  ATARAX/VISTARIL Take 25 mg by mouth 2 (two) times  daily as needed for itching.   Insulin Detemir 100 UNIT/ML Pen Commonly known as:  LEVEMIR Inject 10 Units into the skin daily at 12 noon.   levothyroxine 50 MCG tablet Commonly known as:  SYNTHROID, LEVOTHROID Take 50 mcg by mouth daily before breakfast.   lidocaine-prilocaine cream Commonly known as:  EMLA Apply 1 application topically See admin instructions. Apply small amount to access site 1 to 2 hours before dialysis.   loratadine 10 MG tablet Commonly known as:  CLARITIN Take 10 mg by mouth daily as needed for rhinitis.   Melatonin 10 MG Caps Take 10 mg by mouth at bedtime as needed (sleep).   pramipexole 0.5 MG tablet Commonly known as:  MIRAPEX Take 0.5 mg by mouth at bedtime as needed (sleep).            Durable Medical Equipment   (From admission, onward)         Start     Ordered   11/24/17 1101  For home use only DME 4 wheeled rolling walker with seat  Once    Question:  Patient needs a walker to treat with the following condition  Answer:  Weakness   11/24/17 1101         Follow-up Information    [name], MD. Schedule an appointment as soon as possible for a visit in 1 week(s).   Specialty:  Internal Medicine Why:  Hospital follow up  Contact information: [place] [address] [phone]          Allergies  Allergen Reactions  . No Known Allergies     Consultations: Neurology  Nephrology   Procedures/Studies: Mr Brain 17 Contrast  Result Date: 11/22/2017 CLINICAL DATA:  67 y/o M; found unresponsive with continued altered mental status. EXAM: MRI HEAD WITHOUT CONTRAST TECHNIQUE: Multiplanar, multiecho pulse sequences of the brain and surrounding structures were obtained without intravenous contrast. COMPARISON:  11/21/2017 CT head and CTA head. 11/06/2013 MRI of the head. FINDINGS: Brain: No acute infarction, hemorrhage, hydrocephalus, extra-axial collection or mass lesion. Small chronic lacunar infarcts within the left putamen and left posterior limb of internal capsule. Mild progression of volume loss of the brain. Vascular: Normal flow voids. Skull and upper cervical spine: Normal marrow signal. Sinuses/Orbits: Negative. Other: Right intra-ocular lens replacement. IMPRESSION: 1. No acute intracranial abnormality identified. 2. Small chronic lacunar infarcts within left putamen and posterior limb of internal capsule. 3. Mild progression of volume loss from 2015. Electronically Signed   By: Kristine Garbe M.D.   On: 11/22/2017 03:16   ECHO 8/28 ------------------------------------------------------------------- Study Conclusions  - Left ventricle: The cavity size was normal. Systolic function was   normal. The estimated ejection fraction  was in the range of 60%   to 65%. Wall motion was normal; there were no regional wall   motion abnormalities. There was an increased relative   contribution of atrial contraction to ventricular filling.   Doppler parameters are consistent with abnormal left ventricular   relaxation (grade 1 diastolic dysfunction). Doppler parameters   are consistent with high ventricular filling pressure. - Aortic valve: Trileaflet; normal thickness, mildly calcified   leaflets. - Tricuspid valve: There was trivial regurgitation. - Pulmonic valve: There was trivial regurgitation. - Pulmonary arteries: PA peak pressure: 35 mm Hg (S).   Discharge Exam: Vitals:   11/24/17 0359 11/24/17 0802  BP: (!) 170/67 (!) 149/67  Pulse: 92 (!) 101  Resp: 18 17  Temp: 98.5 F (36.9  C) 98.7 F (37.1 C)  SpO2: 95% 99%   Vitals:   11/23/17 2013 11/23/17 2354 11/24/17 0359 11/24/17 0802  BP: (!) 184/80 (!) 152/64 (!) 170/67 (!) 149/67  Pulse: (!) 103 (!) 102 92 (!) 101  Resp: $Remo'18 18 18 17  'pucMM$ Temp: 98.3 F (36.8 C) 98.3 F (36.8 C) 98.5 F (36.9 C) 98.7 F (37.1 C)  TempSrc: Oral Oral Oral Oral  SpO2: 97% 95% 95% 99%  Weight:      Height:        General: Deconditioned Cardiovascular: RRR, S1/S2 +, no rubs, no gallops Respiratory: CTA bilaterally, no wheezing, no rhonchi Abdominal: Soft, NT, ND, bowel sounds + Extremities: no edema, no cyanosis   The results of significant diagnostics from this hospitalization (including imaging, microbiology, ancillary and laboratory) are listed below for reference.     Microbiology: No results found for this or any previous visit (from the past 240 hour(s)).   Labs: BNP (last 3 results) No results for input(s): BNP in the last 8760 hours. Basic Metabolic Panel: Recent Labs  Lab 11/21/17 1905 11/22/17 0322 11/23/17 0311 11/24/17 0412  NA 141 142 140 138  K 3.5 3.7 4.0 4.1  CL 100 102 98 98  CO2 $Re'25 25 31 30  'aXE$ GLUCOSE 87 52* 159* 197*  BUN 48* 48* 24* 13   CREATININE 7.99* 8.22* 5.19* 3.80*  CALCIUM 8.8* 8.7* 8.5* 9.1   Liver Function Tests: Recent Labs  Lab 11/21/17 1905  AST 17  ALT 16  ALKPHOS 79  BILITOT 0.6  PROT 7.1  ALBUMIN 3.5   No results for input(s): LIPASE, AMYLASE in the last 168 hours. Recent Labs  Lab 11/21/17 2008  AMMONIA 21   CBC: Recent Labs  Lab 11/21/17 1905 11/22/17 0322 11/23/17 1310  WBC 6.7 6.9 5.7  HGB 12.0* 11.4* 11.7*  HCT 35.4* 34.4* 34.3*  MCV 91.5 92.5 90.0  PLT 144* 148* 166   Cardiac Enzymes: Recent Labs  Lab 11/21/17 1905  CKTOTAL 83   BNP: Invalid input(s): POCBNP CBG: Recent Labs  Lab 11/23/17 2146 11/24/17 0015 11/24/17 0359 11/24/17 0801 11/24/17 1124  GLUCAP 199* 210* 160* 207* 334*   D-Dimer No results for input(s): DDIMER in the last 72 hours. Hgb A1c Recent Labs    11/21/17 1905  HGBA1C 9.1*   Lipid Profile Recent Labs    11/22/17 0322  CHOL 183  HDL 27*  LDLCALC 99  TRIG 285*  CHOLHDL 6.8   Thyroid function studies Recent Labs    11/21/17 2008  TSH 2.533   Anemia work up Recent Labs    11/21/17 2008  VITAMINB12 223   Urinalysis    Component Value Date/Time   COLORURINE STRAW (A) 03/10/2016 1905   APPEARANCEUR CLEAR 03/10/2016 1905   LABSPEC 1.011 03/10/2016 1905   PHURINE 6.0 03/10/2016 1905   GLUCOSEU >=500 (A) 03/10/2016 1905   HGBUR SMALL (A) 03/10/2016 1905   BILIRUBINUR NEGATIVE 03/10/2016 1905   KETONESUR NEGATIVE 03/10/2016 1905   PROTEINUR >=300 (A) 03/10/2016 1905   NITRITE NEGATIVE 03/10/2016 1905   LEUKOCYTESUR NEGATIVE 03/10/2016 1905   Sepsis Labs Invalid input(s): PROCALCITONIN,  WBC,  LACTICIDVEN Microbiology No results found for this or any previous visit (from the past 240 hour(s)).   Time coordinating discharge: 35 minutes  SIGNED:  Chipper Oman, MD  [facility] 11/24/2017, 2:20 PM  Pager please text page via  [email or web address]  Note - This record has been created using [employer]. Chart  creation errors  have been sought, but may not always have been located. Such creation errors do not reflect on the standard of medical care.

## 2017-11-24 NOTE — Progress Notes (Signed)
Occupational Therapy Treatment Patient Details Name: Alexander Whitaker MRN: [id number] DOB: Jul 29, 1950 Today's Date: 11/24/2017    History of present illness Alexander Whitaker is a 67yo M with significant PMH of HTN, neuropathy, hypothyoidism, DM, CKD stage 3 on HD, h/o CVA and now presenting with altered mental status and weakness. MRI showing small chronic lacunar infarcts within left putamen and posterior limb of internal capsule.    OT comments  Pt progressing toward OT goals this session with improved functional mobility and ADL transfers. Pt still presenting with cognitive deficits that impact his safety awareness and processing, requiring 24 hr (S) at home. Recommend HHOT f/u at d/c.    Follow Up Recommendations  Supervision/Assistance - 24 hour;Home health OT    Equipment Recommendations  None recommended by OT    Recommendations for Other Services PT consult;Speech consult    Precautions / Restrictions Precautions Precautions: Fall Restrictions Weight Bearing Restrictions: No       Mobility Bed Mobility Overal bed mobility: Needs Assistance Bed Mobility: Supine to Sit     Supine to sit: Min assist Sit to supine: Min guard   General bed mobility comments: assist to elevate trunk into sitting; pt reaching for wife to assist  Transfers Overall transfer level: Needs assistance Equipment used: Rolling walker (2 wheeled) Transfers: Sit to/from Stand Sit to Stand: Supervision Stand pivot transfers: Supervision       General transfer comment: for safety    Balance Overall balance assessment: Needs assistance Sitting-balance support: Feet supported;No upper extremity supported Sitting balance-Leahy Scale: Good     Standing balance support: No upper extremity supported;During functional activity Standing balance-Leahy Scale: Fair                             ADL either performed or assessed with clinical judgement   ADL Overall ADL's : Needs  assistance/impaired Eating/Feeding: Modified independent                       Toilet Transfer: Min guard(standing)   Toileting- Clothing Manipulation and Hygiene: Min guard         General ADL Comments: heavy vc for awareness of condom cath when needing to urinate, pt elected to stand at toilet anyways     Vision   Vision Assessment?: No apparent visual deficits          Cognition Arousal/Alertness: Awake/alert Behavior During Therapy: WFL for tasks assessed/performed Overall Cognitive Status: Impaired/Different from baseline Area of Impairment: Safety/judgement;Awareness;Memory                 Orientation Level: Time;Situation Current Attention Level: Sustained Memory: Decreased short-term memory Following Commands: Follows one step commands with increased time;Follows one step commands consistently Safety/Judgement: Decreased awareness of safety;Decreased awareness of deficits Awareness: Emergent Problem Solving: Decreased initiation;Slow processing;Requires verbal cues General Comments: pt reports feeling "weird" and "not feeling good" and holding his head              General Comments wife present during session    Pertinent Vitals/ Pain       Pain Assessment: No/denies pain         Frequency  Min 2X/week        Progress Toward Goals  OT Goals(current goals can now be found in the care plan section)  Progress towards OT goals: Progressing toward goals  Acute Rehab OT Goals Patient Stated Goal: "to get back to normal"  OT Goal Formulation: With patient/family Time For Goal Achievement: 12/01/17 Potential to Achieve Goals: Good  Plan Discharge plan needs to be updated(CIR denied, pt going home now)       AM-PAC PT "6 Clicks" Daily Activity     Outcome Measure   Help from another person eating meals?: None Help from another person taking care of personal grooming?: None Help from another person toileting, which includes using  toliet, bedpan, or urinal?: A Little Help from another person bathing (including washing, rinsing, drying)?: A Little Help from another person to put on and taking off regular upper body clothing?: None Help from another person to put on and taking off regular lower body clothing?: A Little 6 Click Score: 21    End of Session Equipment Utilized During Treatment: Rolling walker;Gait belt  OT Visit Diagnosis: Unsteadiness on feet (R26.81);Muscle weakness (generalized) (M62.81)   Activity Tolerance Patient tolerated treatment well   Patient Left in chair;with call bell/phone within reach;with chair alarm set;with family/visitor present   Nurse Communication Mobility status        Time: 2707-8675 OT Time Calculation (min): 17 min  Charges: OT General Charges $OT Visit: 1 Visit OT Treatments $Self Care/Home Management : 8-22 mins  Curtis Sites OTR/L 11/24/2017, 12:28 PM

## 2017-11-24 NOTE — Plan of Care (Signed)
  Problem: Education: Goal: Knowledge of General Education information will improve Description Including pain rating scale, medication(s)/side effects and non-pharmacologic comfort measures Outcome: Progressing   Problem: Education: Goal: Knowledge of General Education information will improve Description Including pain rating scale, medication(s)/side effects and non-pharmacologic comfort measures Outcome: Progressing   Problem: Health Behavior/Discharge Planning: Goal: Ability to manage health-related needs will improve Outcome: Progressing   Problem: Clinical Measurements: Goal: Ability to maintain clinical measurements within normal limits will improve Outcome: Progressing Goal: Will remain free from infection Outcome: Progressing Goal: Diagnostic test results will improve Outcome: Progressing Goal: Respiratory complications will improve Outcome: Progressing Goal: Cardiovascular complication will be avoided Outcome: Progressing   Problem: Activity: Goal: Risk for activity intolerance will decrease Outcome: Progressing   Problem: Nutrition: Goal: Adequate nutrition will be maintained Outcome: Progressing   Problem: Coping: Goal: Level of anxiety will decrease Outcome: Progressing   Problem: Elimination: Goal: Will not experience complications related to bowel motility Outcome: Progressing Goal: Will not experience complications related to urinary retention Outcome: Progressing   Problem: Pain Managment: Goal: General experience of comfort will improve Outcome: Progressing   Problem: Safety: Goal: Ability to remain free from injury will improve Outcome: Progressing   Problem: Skin Integrity: Goal: Risk for impaired skin integrity will decrease Outcome: Progressing   Problem: Education: Goal: Knowledge of disease or condition will improve Outcome: Progressing Goal: Knowledge of secondary prevention will improve Outcome: Progressing Goal: Knowledge of  patient specific risk factors addressed and post discharge goals established will improve Outcome: Progressing Goal: Individualized Educational Video(s) Outcome: Progressing   Problem: Coping: Goal: Will verbalize positive feelings about self Outcome: Progressing Goal: Will identify appropriate support needs Outcome: Progressing   Problem: Health Behavior/Discharge Planning: Goal: Ability to manage health-related needs will improve Outcome: Progressing   Problem: Self-Care: Goal: Ability to participate in self-care as condition permits will improve Outcome: Progressing Goal: Verbalization of feelings and concerns over difficulty with self-care will improve Outcome: Progressing Goal: Ability to communicate needs accurately will improve Outcome: Progressing   Problem: Nutrition: Goal: Risk of aspiration will decrease Outcome: Progressing Goal: Dietary intake will improve Outcome: Progressing   Problem: Ischemic Stroke/TIA Tissue Perfusion: Goal: Complications of ischemic stroke/TIA will be minimized Outcome: Progressing

## 2017-11-24 NOTE — Progress Notes (Signed)
Physical Therapy Treatment Patient Details Name: Alexander [facility] MRN: [id number] DOB: November 26, 1950 Today's Date: 11/24/2017    History of Present Illness Steward Sames is a 67yo M with significant PMH of HTN, neuropathy, hypothyoidism, DM, CKD stage 3 on HD, h/o CVA and now presenting with altered mental status and weakness. MRI showing small chronic lacunar infarcts within left putamen and posterior limb of internal capsule.     PT Comments    Patient seen for mobility progression. Patient continues to present with impaired cognition and balance increasing risk for falls. Pt will continue to benefit from further skilled PT services to maximize independence and safety with mobility. Pt would benefit from post acute rehab however in the event family takes pt home he will need 24 hour assistance/supervision and HHPT.    Follow Up Recommendations  CIR;Supervision/Assistance - 24 hour     Equipment Recommendations  Other (comment)(rollator)    Recommendations for Other Services Rehab consult     Precautions / Restrictions Precautions Precautions: Fall Restrictions Weight Bearing Restrictions: No    Mobility  Bed Mobility Overal bed mobility: Needs Assistance Bed Mobility: Supine to Sit     Supine to sit: Min assist     General bed mobility comments: assist to elevate trunk into sitting; pt reaching for wife to assist  Transfers Overall transfer level: Needs assistance   Transfers: Sit to/from Stand Sit to Stand: Min guard         General transfer comment: for safety  Ambulation/Gait Ambulation/Gait assistance: Min assist Gait Distance (Feet): 200 Feet Assistive device: (assist at trunk with gait belt) Gait Pattern/deviations: Step-through pattern;Decreased stride length;Drifts right/left Gait velocity: decreased   General Gait Details: assist for balance; cues for cadence   Stairs             Wheelchair Mobility    Modified Rankin (Stroke Patients  Only)       Balance Overall balance assessment: Needs assistance Sitting-balance support: Feet supported;No upper extremity supported Sitting balance-Leahy Scale: Good     Standing balance support: No upper extremity supported;During functional activity Standing balance-Leahy Scale: Fair                              Cognition Arousal/Alertness: Awake/alert Behavior During Therapy: WFL for tasks assessed/performed Overall Cognitive Status: Impaired/Different from baseline Area of Impairment: Memory;Orientation;Awareness;Safety/judgement;Problem solving;Attention;Following commands                 Orientation Level: Time;Place;Disoriented to;Situation(pt responds "I might be able to tell you that" ) Current Attention Level: Sustained Memory: Decreased short-term memory Following Commands: Follows one step commands with increased time;Follows one step commands consistently Safety/Judgement: Decreased awareness of safety;Decreased awareness of deficits Awareness: Emergent Problem Solving: Decreased initiation;Slow processing;Requires verbal cues General Comments: pt reports feeling "weird" and "not feeling good" and holding his head      Exercises      General Comments General comments (skin integrity, edema, etc.): wife present during session      Pertinent Vitals/Pain Pain Assessment: No/denies pain    Home Living                      Prior Function            PT Goals (current goals can now be found in the care plan section) Progress towards PT goals: Progressing toward goals    Frequency    Min 3X/week  PT Plan Current plan remains appropriate    Co-evaluation              AM-PAC PT "6 Clicks" Daily Activity  Outcome Measure  Difficulty turning over in bed (including adjusting bedclothes, sheets and blankets)?: None Difficulty moving from lying on back to sitting on the side of the bed? : A Lot Difficulty sitting  down on and standing up from a chair with arms (e.g., wheelchair, bedside commode, etc,.)?: Unable Help needed moving to and from a bed to chair (including a wheelchair)?: A Little Help needed walking in hospital room?: A Little Help needed climbing 3-5 steps with a railing? : A Little 6 Click Score: 16    End of Session Equipment Utilized During Treatment: Gait belt Activity Tolerance: Patient tolerated treatment well Patient left: with call bell/phone within reach;with family/visitor present;in chair;with chair alarm set Nurse Communication: Mobility status PT Visit Diagnosis: Unsteadiness on feet (R26.81);Difficulty in walking, not elsewhere classified (R26.2)     Time: 9:19 - 9:50 58minutes    Charges:   Gait training 8-22 mins therapeutic activity 8-64mins                     Earney Navy, PTA Pager: [phone]     Darliss Cheney 11/24/2017, 12:16 PM

## 2017-11-24 NOTE — Care Management Note (Addendum)
Case Management Note  Patient Details  Name: Alexander [facility] MRN: [id number] Date of Birth: [birth year]  Subjective/Objective:  Pt in with acute encephalopathy. He is from home with his spouse. Per spouse their family all live on same property and will assist in providing the patient 24 hour supervision.  They deny issues with obtaining medications or with transportation.                 Action/Plan: Pt discharging home with orders for [facility] services. CM provided choice and they selected [facility]. Antony Madura with [facility] notified and accepted the referral.  Pt with orders for a rollator. CM notified Butch Penny with [facility] and it was delivered to the room. Wife to provide transportation home.   Expected Discharge Date:  11/24/17               Expected Discharge Plan:  [place]  In-House Referral:     Discharge planning Services  CM Consult  Post Acute Care Choice:  Durable Medical Equipment, Home Health Choice offered to:  Patient, Spouse  DME Arranged:  Walker rolling with seat DME Agency:  [place]:  RN, PT, OT, Nurse's Aide, Social Work [employer] Agency:  [facility]  Status of Service:  Completed, signed off  If discussed at [employer], dates discussed:    Additional Comments:  Pollie Friar, RN 11/24/2017, 2:41 PM

## 2017-11-24 NOTE — Care Management Important Message (Signed)
Important Message  Patient Details  Name: Alexander [facility]aker MRN: [id number] Date of Birth: [birth year]   Medicare Important Message Given:  Yes    Orbie Pyo 11/24/2017, 4:13 PM

## 2017-11-24 NOTE — Progress Notes (Signed)
Subjective:   Working with therapy currently; wife at bedside.  Feeling ok.  No issues with HD yesterday.  CIR evaluated - looks like rec is to go home with [facility]  Objective Vital signs in last 24 hours: Vitals:   11/23/17 2013 11/23/17 2354 11/24/17 0359 11/24/17 0802  BP: (!) 184/80 (!) 152/64 (!) 170/67 (!) 149/67  Pulse: (!) 103 (!) 102 92 (!) 101  Resp: $Remo'18 18 18 17  'KbpXk$ Temp: 98.3 F (36.8 C) 98.3 F (36.8 C) 98.5 F (36.9 C) 98.7 F (37.1 C)  TempSrc: Oral Oral Oral Oral  SpO2: 97% 95% 95% 99%  Weight:      Height:       Weight change: -1 kg  Intake/Output Summary (Last 24 hours) at 11/24/2017 0941 Last data filed at 11/23/2017 1758 Gross per 24 hour  Intake -  Output 1900 ml  Net -1900 ml    Dialysis Orders: Center: [facility] on TTS . 4hr UF Proile4  EDW 80kg ( left 79.5 kg last hd  And htn pre.post /    2k, 2.25 CA   Heparin none . Access RUA AVF     Mircera 29mcg q 2 wks ( last given 11/07/17)  Last op hgb 11.0 (8/  /19)    Assessment/Plan 1. ESRD -  Plan HD tomorrow if still hospitalized.  Post HD wt 77.3kg yesterday, EDW 80 - will need new EDW on discharge.  2. Acute Encephalopathy with  Dysarthria / Neuro eval  Toxic metabolic Encephalopathy Bernette Mayers Urgency  /eval for stroke - w/u negative, this is being considered syncope per notes.   3. Hypertension/volume  -  BP this am 149/67.  Below EDW. Try 1.5L UF tomorrow.  UOP 413mL yesterday.    Will need new EDW at discharge.  4. Anemia  - hgb 11.7 no esa  Monitor trend hgb  5. Metabolic bone disease -  No vit d , binder with meals  6. HO CVA  7. HO DM type 2 - Carb mod renal diet/ meds per admit / hgb A1c =9.1 on admit  Justin Mend    Labs: Basic Metabolic Panel: Recent Labs  Lab 11/22/17 0322 11/23/17 0311 11/24/17 0412  NA 142 140 138  K 3.7 4.0 4.1  CL 102 98 98  CO2 $Re'25 31 30  'nuH$ GLUCOSE 52* 159* 197*  BUN 48* 24* 13  CREATININE 8.22* 5.19* 3.80*  CALCIUM 8.7* 8.5* 9.1   Liver Function Tests: Recent Labs   Lab 11/21/17 1905  AST 17  ALT 16  ALKPHOS 79  BILITOT 0.6  PROT 7.1  ALBUMIN 3.5   No results for input(s): LIPASE, AMYLASE in the last 168 hours. Recent Labs  Lab 11/21/17 2008  AMMONIA 21   CBC: Recent Labs  Lab 11/21/17 1905 11/22/17 0322 11/23/17 1310  WBC 6.7 6.9 5.7  HGB 12.0* 11.4* 11.7*  HCT 35.4* 34.4* 34.3*  MCV 91.5 92.5 90.0  PLT 144* 148* 166   Cardiac Enzymes: Recent Labs  Lab 11/21/17 1905  CKTOTAL 83   CBG: Recent Labs  Lab 11/23/17 1123 11/23/17 2146 11/24/17 0015 11/24/17 0359 11/24/17 0801  GLUCAP 366* 199* 210* 160* 207*    Iron Studies: No results for input(s): IRON, TIBC, TRANSFERRIN, FERRITIN in the last 72 hours. Studies/Results: No results found. Medications: Infusions: . sodium chloride    . sodium chloride    . sodium chloride      Scheduled Medications: . amLODipine  10 mg Oral Daily  .  amLODipine  5 mg Oral Once  . calcium acetate  1,334 mg Oral TID WC  . Chlorhexidine Gluconate Cloth  6 each Topical Q0600  . Chlorhexidine Gluconate Cloth  6 each Topical Q0600  . heparin  5,000 Units Subcutaneous Q8H  . hydrALAZINE  25 mg Oral Q8H  . insulin aspart  0-9 Units Subcutaneous Q4H  . levothyroxine  50 mcg Oral QAC breakfast    have reviewed scheduled and prn medications.  Physical Exam: General:  Sitting edge of bed, talking with therapist Heart: RRR Lungs: clear Abdomen: soft Extremities: no edema Dialysis Access: RUE AVF T/B+    11/24/2017,9:41 AM  LOS: 3 days

## 2017-11-24 NOTE — Progress Notes (Signed)
Results for CALLOWAY, ANDRUS (MRN [id number]) as of 11/24/2017 14:54  Ref. Range 11/23/2017 21:46 11/24/2017 00:15 11/24/2017 03:59 11/24/2017 08:01 11/24/2017 11:24  Glucose-Capillary Latest Ref Range: 70 - 99 mg/dL 199 (H) 210 (H) 160 (H) 207 (H) 334 (H)  Noted that blood sugars are trending up and are greater than 200 mg/dl.  Recommend adding Levemir 10 units daily if blood sugars continue to be elevated. May need to increase Novolog correction scale to 0-15 units TID & HS.   Harvel Ricks RN BSN CDE Diabetes Coordinator Pager: [phone]  8am-5pm

## 2017-11-24 NOTE — Plan of Care (Signed)
Problem: Education: Goal: Knowledge of General Education information will improve Description Including pain rating scale, medication(s)/side effects and non-pharmacologic comfort measures 11/24/2017 1524 by Evalee Jefferson, RN Outcome: Adequate for Discharge 11/24/2017 1232 by Evalee Jefferson, RN Outcome: Progressing   Problem: Education: Goal: Knowledge of General Education information will improve Description Including pain rating scale, medication(s)/side effects and non-pharmacologic comfort measures 11/24/2017 1524 by Evalee Jefferson, RN Outcome: Adequate for Discharge 11/24/2017 1232 by Evalee Jefferson, RN Outcome: Progressing   Problem: Health Behavior/Discharge Planning: Goal: Ability to manage health-related needs will improve 11/24/2017 1524 by Evalee Jefferson, RN Outcome: Adequate for Discharge 11/24/2017 1232 by Evalee Jefferson, RN Outcome: Progressing   Problem: Clinical Measurements: Goal: Ability to maintain clinical measurements within normal limits will improve 11/24/2017 1524 by Evalee Jefferson, RN Outcome: Adequate for Discharge 11/24/2017 1232 by Evalee Jefferson, RN Outcome: Progressing Goal: Will remain free from infection 11/24/2017 1524 by Evalee Jefferson, RN Outcome: Adequate for Discharge 11/24/2017 1232 by Evalee Jefferson, RN Outcome: Progressing Goal: Diagnostic test results will improve 11/24/2017 1524 by Evalee Jefferson, RN Outcome: Adequate for Discharge 11/24/2017 1232 by Evalee Jefferson, RN Outcome: Progressing Goal: Respiratory complications will improve 11/24/2017 1524 by Evalee Jefferson, RN Outcome: Adequate for Discharge 11/24/2017 1232 by Evalee Jefferson, RN Outcome: Progressing Goal: Cardiovascular complication will be avoided 11/24/2017 1524 by Evalee Jefferson, RN Outcome: Adequate for Discharge 11/24/2017 1232 by Evalee Jefferson,  RN Outcome: Progressing   Problem: Activity: Goal: Risk for activity intolerance will decrease 11/24/2017 1524 by Evalee Jefferson, RN Outcome: Adequate for Discharge 11/24/2017 1232 by Evalee Jefferson, RN Outcome: Progressing   Problem: Nutrition: Goal: Adequate nutrition will be maintained 11/24/2017 1524 by Evalee Jefferson, RN Outcome: Adequate for Discharge 11/24/2017 1232 by Evalee Jefferson, RN Outcome: Progressing   Problem: Coping: Goal: Level of anxiety will decrease 11/24/2017 1524 by Evalee Jefferson, RN Outcome: Adequate for Discharge 11/24/2017 1232 by Evalee Jefferson, RN Outcome: Progressing   Problem: Elimination: Goal: Will not experience complications related to bowel motility 11/24/2017 1524 by Evalee Jefferson, RN Outcome: Adequate for Discharge 11/24/2017 1232 by Evalee Jefferson, RN Outcome: Progressing Goal: Will not experience complications related to urinary retention 11/24/2017 1524 by Evalee Jefferson, RN Outcome: Adequate for Discharge 11/24/2017 1232 by Evalee Jefferson, RN Outcome: Progressing   Problem: Pain Managment: Goal: General experience of comfort will improve 11/24/2017 1524 by Evalee Jefferson, RN Outcome: Adequate for Discharge 11/24/2017 1232 by Evalee Jefferson, RN Outcome: Progressing   Problem: Safety: Goal: Ability to remain free from injury will improve 11/24/2017 1524 by Evalee Jefferson, RN Outcome: Adequate for Discharge 11/24/2017 1232 by Evalee Jefferson, RN Outcome: Progressing   Problem: Skin Integrity: Goal: Risk for impaired skin integrity will decrease 11/24/2017 1524 by Evalee Jefferson, RN Outcome: Adequate for Discharge 11/24/2017 1232 by Evalee Jefferson, RN Outcome: Progressing   Problem: Education: Goal: Knowledge of disease or condition will improve 11/24/2017 1524 by Evalee Jefferson, RN Outcome: Adequate  for Discharge 11/24/2017 1232 by Evalee Jefferson, RN Outcome: Progressing Goal: Knowledge of secondary prevention will improve 11/24/2017 1524 by Evalee Jefferson, RN Outcome: Adequate for Discharge 11/24/2017 1232 by Evalee Jefferson, RN Outcome: Progressing Goal: Knowledge of patient specific risk factors addressed and post discharge goals established will improve 11/24/2017 1524 by Evalee Jefferson, RN Outcome: Adequate for Discharge 11/24/2017 1232 by Algis Greenhouse  P, RN Outcome: Progressing Goal: Individualized Educational Video(s) 11/24/2017 1524 by Evalee Jefferson, RN Outcome: Adequate for Discharge 11/24/2017 1232 by Evalee Jefferson, RN Outcome: Progressing   Problem: Coping: Goal: Will verbalize positive feelings about self 11/24/2017 1524 by Evalee Jefferson, RN Outcome: Adequate for Discharge 11/24/2017 1232 by Evalee Jefferson, RN Outcome: Progressing Goal: Will identify appropriate support needs 11/24/2017 1524 by Evalee Jefferson, RN Outcome: Adequate for Discharge 11/24/2017 1232 by Evalee Jefferson, RN Outcome: Progressing   Problem: Health Behavior/Discharge Planning: Goal: Ability to manage health-related needs will improve 11/24/2017 1524 by Evalee Jefferson, RN Outcome: Adequate for Discharge 11/24/2017 1232 by Evalee Jefferson, RN Outcome: Progressing   Problem: Self-Care: Goal: Ability to participate in self-care as condition permits will improve 11/24/2017 1524 by Evalee Jefferson, RN Outcome: Adequate for Discharge 11/24/2017 1232 by Evalee Jefferson, RN Outcome: Progressing Goal: Verbalization of feelings and concerns over difficulty with self-care will improve 11/24/2017 1524 by Evalee Jefferson, RN Outcome: Adequate for Discharge 11/24/2017 1232 by Evalee Jefferson, RN Outcome: Progressing Goal: Ability to communicate needs accurately will  improve 11/24/2017 1524 by Keshan Reha, Vertell Limber, RN Outcome: Adequate for Discharge 11/24/2017 1232 by Evalee Jefferson, RN Outcome: Progressing   Problem: Nutrition: Goal: Risk of aspiration will decrease 11/24/2017 1524 by Evalee Jefferson, RN Outcome: Adequate for Discharge 11/24/2017 1232 by Evalee Jefferson, RN Outcome: Progressing Goal: Dietary intake will improve 11/24/2017 1524 by Evalee Jefferson, RN Outcome: Adequate for Discharge 11/24/2017 1232 by Evalee Jefferson, RN Outcome: Progressing   Problem: Ischemic Stroke/TIA Tissue Perfusion: Goal: Complications of ischemic stroke/TIA will be minimized 11/24/2017 1524 by Evalee Jefferson, RN Outcome: Adequate for Discharge 11/24/2017 1232 by Evalee Jefferson, RN Outcome: Progressing

## 2017-11-24 NOTE — Progress Notes (Signed)
  Speech Language Pathology Treatment: Cognitive-Linquistic  Patient Details Name: DUNCAN ALEJANDRO MRN: [id number] DOB: Jan 22, 1951 Today's Date: 11/24/2017 Time: 1100-1130 SLP Time Calculation (min) (ACUTE ONLY): 30 min  Assessment / Plan / Recommendation Clinical Impression  Pt seen at bedside for cognitive-linguistic treatment. Pt's wife was present, and indicated she had no questions from evaluation completed 11/23/17. Pt seemed to be perseverating on HD from yesterday, and had difficulty being redirected to participate with SLP. SLP reiterated results from evaluation, and encouraged 24 hour supervision after DC, given significant cognitive impairments. Continued ST intervention is recommended after DC, regardless of venue.   HPI HPI: 67 year old male admitted 11/21/17 with AMS, dysarthria. PMH: ESRD (HD TThS), DM, hypothyroidism, chronic anemia, dCHF, essential HTN, CVA with residual RLE weakness, GERD, CKD3. MRI = no acute abnormality, small chronic lacunar infarcts in left putamen and posterior limb of internal capsule.      SLP Plan  Continue with current plan of care       Recommendations   24 hour supervision, continued skilled ST intervention after acute DC.            Follow up Recommendations: 24 hour supervision/assistance(continued ST intervention is recommended after acute DC) SLP Visit Diagnosis: Cognitive communication deficit (H15.056) Plan: Continue with current plan of care       [place]. Quentin Ore [facility], CCC-SLP Speech Language Pathologist [phone]  Shonna Chock 11/24/2017, 11:39 AM

## 2017-11-25 DIAGNOSIS — N2581 Secondary hyperparathyroidism of renal origin: Secondary | ICD-10-CM | POA: Diagnosis not present

## 2017-11-25 DIAGNOSIS — N186 End stage renal disease: Secondary | ICD-10-CM | POA: Diagnosis not present

## 2017-11-25 DIAGNOSIS — E1129 Type 2 diabetes mellitus with other diabetic kidney complication: Secondary | ICD-10-CM | POA: Diagnosis not present

## 2017-11-25 DIAGNOSIS — Z992 Dependence on renal dialysis: Secondary | ICD-10-CM | POA: Diagnosis not present

## 2017-11-27 DIAGNOSIS — I132 Hypertensive heart and chronic kidney disease with heart failure and with stage 5 chronic kidney disease, or end stage renal disease: Secondary | ICD-10-CM | POA: Diagnosis not present

## 2017-11-27 DIAGNOSIS — Z992 Dependence on renal dialysis: Secondary | ICD-10-CM | POA: Diagnosis not present

## 2017-11-27 DIAGNOSIS — Z794 Long term (current) use of insulin: Secondary | ICD-10-CM | POA: Diagnosis not present

## 2017-11-27 DIAGNOSIS — E1122 Type 2 diabetes mellitus with diabetic chronic kidney disease: Secondary | ICD-10-CM | POA: Diagnosis not present

## 2017-11-27 DIAGNOSIS — R41841 Cognitive communication deficit: Secondary | ICD-10-CM | POA: Diagnosis not present

## 2017-11-27 DIAGNOSIS — I5032 Chronic diastolic (congestive) heart failure: Secondary | ICD-10-CM | POA: Diagnosis not present

## 2017-11-27 DIAGNOSIS — N186 End stage renal disease: Secondary | ICD-10-CM | POA: Diagnosis not present

## 2017-11-27 DIAGNOSIS — R55 Syncope and collapse: Secondary | ICD-10-CM | POA: Diagnosis not present

## 2017-11-27 DIAGNOSIS — M6281 Muscle weakness (generalized): Secondary | ICD-10-CM | POA: Diagnosis not present

## 2017-11-28 DIAGNOSIS — N186 End stage renal disease: Secondary | ICD-10-CM | POA: Diagnosis not present

## 2017-11-28 DIAGNOSIS — N2581 Secondary hyperparathyroidism of renal origin: Secondary | ICD-10-CM | POA: Diagnosis not present

## 2017-11-30 DIAGNOSIS — N2581 Secondary hyperparathyroidism of renal origin: Secondary | ICD-10-CM | POA: Diagnosis not present

## 2017-11-30 DIAGNOSIS — N186 End stage renal disease: Secondary | ICD-10-CM | POA: Diagnosis not present

## 2017-12-02 DIAGNOSIS — N186 End stage renal disease: Secondary | ICD-10-CM | POA: Diagnosis not present

## 2017-12-02 DIAGNOSIS — N2581 Secondary hyperparathyroidism of renal origin: Secondary | ICD-10-CM | POA: Diagnosis not present

## 2017-12-05 DIAGNOSIS — Z992 Dependence on renal dialysis: Secondary | ICD-10-CM | POA: Diagnosis not present

## 2017-12-05 DIAGNOSIS — N186 End stage renal disease: Secondary | ICD-10-CM | POA: Diagnosis not present

## 2017-12-05 DIAGNOSIS — N2581 Secondary hyperparathyroidism of renal origin: Secondary | ICD-10-CM | POA: Diagnosis not present

## 2017-12-05 DIAGNOSIS — Z794 Long term (current) use of insulin: Secondary | ICD-10-CM | POA: Diagnosis not present

## 2017-12-05 DIAGNOSIS — E1122 Type 2 diabetes mellitus with diabetic chronic kidney disease: Secondary | ICD-10-CM | POA: Diagnosis not present

## 2017-12-05 DIAGNOSIS — R41841 Cognitive communication deficit: Secondary | ICD-10-CM | POA: Diagnosis not present

## 2017-12-05 DIAGNOSIS — I5032 Chronic diastolic (congestive) heart failure: Secondary | ICD-10-CM | POA: Diagnosis not present

## 2017-12-05 DIAGNOSIS — M6281 Muscle weakness (generalized): Secondary | ICD-10-CM | POA: Diagnosis not present

## 2017-12-05 DIAGNOSIS — I132 Hypertensive heart and chronic kidney disease with heart failure and with stage 5 chronic kidney disease, or end stage renal disease: Secondary | ICD-10-CM | POA: Diagnosis not present

## 2017-12-05 DIAGNOSIS — R55 Syncope and collapse: Secondary | ICD-10-CM | POA: Diagnosis not present

## 2017-12-06 DIAGNOSIS — R55 Syncope and collapse: Secondary | ICD-10-CM | POA: Diagnosis not present

## 2017-12-06 DIAGNOSIS — I129 Hypertensive chronic kidney disease with stage 1 through stage 4 chronic kidney disease, or unspecified chronic kidney disease: Secondary | ICD-10-CM | POA: Diagnosis not present

## 2017-12-06 DIAGNOSIS — Z6829 Body mass index (BMI) 29.0-29.9, adult: Secondary | ICD-10-CM | POA: Diagnosis not present

## 2017-12-06 DIAGNOSIS — R1013 Epigastric pain: Secondary | ICD-10-CM | POA: Diagnosis not present

## 2017-12-07 DIAGNOSIS — N186 End stage renal disease: Secondary | ICD-10-CM | POA: Diagnosis not present

## 2017-12-07 DIAGNOSIS — N2581 Secondary hyperparathyroidism of renal origin: Secondary | ICD-10-CM | POA: Diagnosis not present

## 2017-12-08 DIAGNOSIS — N186 End stage renal disease: Secondary | ICD-10-CM | POA: Diagnosis not present

## 2017-12-08 DIAGNOSIS — Z794 Long term (current) use of insulin: Secondary | ICD-10-CM | POA: Diagnosis not present

## 2017-12-08 DIAGNOSIS — E1122 Type 2 diabetes mellitus with diabetic chronic kidney disease: Secondary | ICD-10-CM | POA: Diagnosis not present

## 2017-12-08 DIAGNOSIS — Z992 Dependence on renal dialysis: Secondary | ICD-10-CM | POA: Diagnosis not present

## 2017-12-08 DIAGNOSIS — R41841 Cognitive communication deficit: Secondary | ICD-10-CM | POA: Diagnosis not present

## 2017-12-08 DIAGNOSIS — R55 Syncope and collapse: Secondary | ICD-10-CM | POA: Diagnosis not present

## 2017-12-08 DIAGNOSIS — I132 Hypertensive heart and chronic kidney disease with heart failure and with stage 5 chronic kidney disease, or end stage renal disease: Secondary | ICD-10-CM | POA: Diagnosis not present

## 2017-12-08 DIAGNOSIS — M6281 Muscle weakness (generalized): Secondary | ICD-10-CM | POA: Diagnosis not present

## 2017-12-08 DIAGNOSIS — I5032 Chronic diastolic (congestive) heart failure: Secondary | ICD-10-CM | POA: Diagnosis not present

## 2017-12-09 DIAGNOSIS — N186 End stage renal disease: Secondary | ICD-10-CM | POA: Diagnosis not present

## 2017-12-09 DIAGNOSIS — N2581 Secondary hyperparathyroidism of renal origin: Secondary | ICD-10-CM | POA: Diagnosis not present

## 2017-12-12 DIAGNOSIS — N186 End stage renal disease: Secondary | ICD-10-CM | POA: Diagnosis not present

## 2017-12-12 DIAGNOSIS — N2581 Secondary hyperparathyroidism of renal origin: Secondary | ICD-10-CM | POA: Diagnosis not present

## 2017-12-13 DIAGNOSIS — I132 Hypertensive heart and chronic kidney disease with heart failure and with stage 5 chronic kidney disease, or end stage renal disease: Secondary | ICD-10-CM | POA: Diagnosis not present

## 2017-12-13 DIAGNOSIS — M6281 Muscle weakness (generalized): Secondary | ICD-10-CM | POA: Diagnosis not present

## 2017-12-13 DIAGNOSIS — Z794 Long term (current) use of insulin: Secondary | ICD-10-CM | POA: Diagnosis not present

## 2017-12-13 DIAGNOSIS — N186 End stage renal disease: Secondary | ICD-10-CM | POA: Diagnosis not present

## 2017-12-13 DIAGNOSIS — R41841 Cognitive communication deficit: Secondary | ICD-10-CM | POA: Diagnosis not present

## 2017-12-13 DIAGNOSIS — I5032 Chronic diastolic (congestive) heart failure: Secondary | ICD-10-CM | POA: Diagnosis not present

## 2017-12-13 DIAGNOSIS — R55 Syncope and collapse: Secondary | ICD-10-CM | POA: Diagnosis not present

## 2017-12-13 DIAGNOSIS — Z992 Dependence on renal dialysis: Secondary | ICD-10-CM | POA: Diagnosis not present

## 2017-12-13 DIAGNOSIS — E1122 Type 2 diabetes mellitus with diabetic chronic kidney disease: Secondary | ICD-10-CM | POA: Diagnosis not present

## 2017-12-14 DIAGNOSIS — N2581 Secondary hyperparathyroidism of renal origin: Secondary | ICD-10-CM | POA: Diagnosis not present

## 2017-12-14 DIAGNOSIS — N186 End stage renal disease: Secondary | ICD-10-CM | POA: Diagnosis not present

## 2017-12-15 DIAGNOSIS — R55 Syncope and collapse: Secondary | ICD-10-CM | POA: Diagnosis not present

## 2017-12-15 DIAGNOSIS — Z992 Dependence on renal dialysis: Secondary | ICD-10-CM | POA: Diagnosis not present

## 2017-12-15 DIAGNOSIS — N186 End stage renal disease: Secondary | ICD-10-CM | POA: Diagnosis not present

## 2017-12-15 DIAGNOSIS — I132 Hypertensive heart and chronic kidney disease with heart failure and with stage 5 chronic kidney disease, or end stage renal disease: Secondary | ICD-10-CM | POA: Diagnosis not present

## 2017-12-15 DIAGNOSIS — Z794 Long term (current) use of insulin: Secondary | ICD-10-CM | POA: Diagnosis not present

## 2017-12-15 DIAGNOSIS — R41841 Cognitive communication deficit: Secondary | ICD-10-CM | POA: Diagnosis not present

## 2017-12-15 DIAGNOSIS — M6281 Muscle weakness (generalized): Secondary | ICD-10-CM | POA: Diagnosis not present

## 2017-12-15 DIAGNOSIS — E1122 Type 2 diabetes mellitus with diabetic chronic kidney disease: Secondary | ICD-10-CM | POA: Diagnosis not present

## 2017-12-15 DIAGNOSIS — I5032 Chronic diastolic (congestive) heart failure: Secondary | ICD-10-CM | POA: Diagnosis not present

## 2017-12-16 DIAGNOSIS — N186 End stage renal disease: Secondary | ICD-10-CM | POA: Diagnosis not present

## 2017-12-16 DIAGNOSIS — N2581 Secondary hyperparathyroidism of renal origin: Secondary | ICD-10-CM | POA: Diagnosis not present

## 2017-12-19 DIAGNOSIS — N186 End stage renal disease: Secondary | ICD-10-CM | POA: Diagnosis not present

## 2017-12-19 DIAGNOSIS — N2581 Secondary hyperparathyroidism of renal origin: Secondary | ICD-10-CM | POA: Diagnosis not present

## 2017-12-20 DIAGNOSIS — I120 Hypertensive chronic kidney disease with stage 5 chronic kidney disease or end stage renal disease: Secondary | ICD-10-CM | POA: Diagnosis not present

## 2017-12-20 DIAGNOSIS — J449 Chronic obstructive pulmonary disease, unspecified: Secondary | ICD-10-CM | POA: Diagnosis not present

## 2017-12-20 DIAGNOSIS — Z8673 Personal history of transient ischemic attack (TIA), and cerebral infarction without residual deficits: Secondary | ICD-10-CM | POA: Diagnosis not present

## 2017-12-20 DIAGNOSIS — E1122 Type 2 diabetes mellitus with diabetic chronic kidney disease: Secondary | ICD-10-CM | POA: Diagnosis not present

## 2017-12-20 DIAGNOSIS — Z794 Long term (current) use of insulin: Secondary | ICD-10-CM | POA: Diagnosis not present

## 2017-12-20 DIAGNOSIS — Z992 Dependence on renal dialysis: Secondary | ICD-10-CM | POA: Diagnosis not present

## 2017-12-20 DIAGNOSIS — N186 End stage renal disease: Secondary | ICD-10-CM | POA: Diagnosis not present

## 2017-12-20 DIAGNOSIS — I739 Peripheral vascular disease, unspecified: Secondary | ICD-10-CM | POA: Diagnosis not present

## 2017-12-20 DIAGNOSIS — I770 Arteriovenous fistula, acquired: Secondary | ICD-10-CM | POA: Diagnosis not present

## 2017-12-20 DIAGNOSIS — Z87891 Personal history of nicotine dependence: Secondary | ICD-10-CM | POA: Diagnosis not present

## 2017-12-20 DIAGNOSIS — Z125 Encounter for screening for malignant neoplasm of prostate: Secondary | ICD-10-CM | POA: Diagnosis not present

## 2017-12-20 DIAGNOSIS — Z01818 Encounter for other preprocedural examination: Secondary | ICD-10-CM | POA: Diagnosis not present

## 2017-12-21 DIAGNOSIS — N2581 Secondary hyperparathyroidism of renal origin: Secondary | ICD-10-CM | POA: Diagnosis not present

## 2017-12-21 DIAGNOSIS — N186 End stage renal disease: Secondary | ICD-10-CM | POA: Diagnosis not present

## 2017-12-23 DIAGNOSIS — N186 End stage renal disease: Secondary | ICD-10-CM | POA: Diagnosis not present

## 2017-12-23 DIAGNOSIS — N2581 Secondary hyperparathyroidism of renal origin: Secondary | ICD-10-CM | POA: Diagnosis not present

## 2017-12-25 DIAGNOSIS — Z992 Dependence on renal dialysis: Secondary | ICD-10-CM | POA: Diagnosis not present

## 2017-12-25 DIAGNOSIS — E1129 Type 2 diabetes mellitus with other diabetic kidney complication: Secondary | ICD-10-CM | POA: Diagnosis not present

## 2017-12-25 DIAGNOSIS — N186 End stage renal disease: Secondary | ICD-10-CM | POA: Diagnosis not present

## 2017-12-26 DIAGNOSIS — N2581 Secondary hyperparathyroidism of renal origin: Secondary | ICD-10-CM | POA: Diagnosis not present

## 2017-12-26 DIAGNOSIS — N186 End stage renal disease: Secondary | ICD-10-CM | POA: Diagnosis not present

## 2017-12-26 DIAGNOSIS — E8770 Fluid overload, unspecified: Secondary | ICD-10-CM | POA: Diagnosis not present

## 2017-12-28 DIAGNOSIS — E8770 Fluid overload, unspecified: Secondary | ICD-10-CM | POA: Diagnosis not present

## 2017-12-28 DIAGNOSIS — N2581 Secondary hyperparathyroidism of renal origin: Secondary | ICD-10-CM | POA: Diagnosis not present

## 2017-12-28 DIAGNOSIS — N186 End stage renal disease: Secondary | ICD-10-CM | POA: Diagnosis not present

## 2017-12-30 DIAGNOSIS — N186 End stage renal disease: Secondary | ICD-10-CM | POA: Diagnosis not present

## 2017-12-30 DIAGNOSIS — E8770 Fluid overload, unspecified: Secondary | ICD-10-CM | POA: Diagnosis not present

## 2017-12-30 DIAGNOSIS — N2581 Secondary hyperparathyroidism of renal origin: Secondary | ICD-10-CM | POA: Diagnosis not present

## 2018-01-02 DIAGNOSIS — N2581 Secondary hyperparathyroidism of renal origin: Secondary | ICD-10-CM | POA: Diagnosis not present

## 2018-01-02 DIAGNOSIS — E8770 Fluid overload, unspecified: Secondary | ICD-10-CM | POA: Diagnosis not present

## 2018-01-02 DIAGNOSIS — N186 End stage renal disease: Secondary | ICD-10-CM | POA: Diagnosis not present

## 2018-01-04 DIAGNOSIS — E8770 Fluid overload, unspecified: Secondary | ICD-10-CM | POA: Diagnosis not present

## 2018-01-04 DIAGNOSIS — N186 End stage renal disease: Secondary | ICD-10-CM | POA: Diagnosis not present

## 2018-01-04 DIAGNOSIS — N2581 Secondary hyperparathyroidism of renal origin: Secondary | ICD-10-CM | POA: Diagnosis not present

## 2018-01-06 DIAGNOSIS — E8770 Fluid overload, unspecified: Secondary | ICD-10-CM | POA: Diagnosis not present

## 2018-01-06 DIAGNOSIS — N2581 Secondary hyperparathyroidism of renal origin: Secondary | ICD-10-CM | POA: Diagnosis not present

## 2018-01-06 DIAGNOSIS — N186 End stage renal disease: Secondary | ICD-10-CM | POA: Diagnosis not present

## 2018-01-10 DIAGNOSIS — N186 End stage renal disease: Secondary | ICD-10-CM | POA: Diagnosis not present

## 2018-01-10 DIAGNOSIS — E8770 Fluid overload, unspecified: Secondary | ICD-10-CM | POA: Diagnosis not present

## 2018-01-10 DIAGNOSIS — N2581 Secondary hyperparathyroidism of renal origin: Secondary | ICD-10-CM | POA: Diagnosis not present

## 2018-01-11 DIAGNOSIS — N186 End stage renal disease: Secondary | ICD-10-CM | POA: Diagnosis not present

## 2018-01-11 DIAGNOSIS — E8770 Fluid overload, unspecified: Secondary | ICD-10-CM | POA: Diagnosis not present

## 2018-01-11 DIAGNOSIS — N2581 Secondary hyperparathyroidism of renal origin: Secondary | ICD-10-CM | POA: Diagnosis not present

## 2018-01-13 DIAGNOSIS — N2581 Secondary hyperparathyroidism of renal origin: Secondary | ICD-10-CM | POA: Diagnosis not present

## 2018-01-13 DIAGNOSIS — E8770 Fluid overload, unspecified: Secondary | ICD-10-CM | POA: Diagnosis not present

## 2018-01-13 DIAGNOSIS — N186 End stage renal disease: Secondary | ICD-10-CM | POA: Diagnosis not present

## 2018-01-16 DIAGNOSIS — E8770 Fluid overload, unspecified: Secondary | ICD-10-CM | POA: Diagnosis not present

## 2018-01-16 DIAGNOSIS — N2581 Secondary hyperparathyroidism of renal origin: Secondary | ICD-10-CM | POA: Diagnosis not present

## 2018-01-16 DIAGNOSIS — N186 End stage renal disease: Secondary | ICD-10-CM | POA: Diagnosis not present

## 2018-01-17 DIAGNOSIS — E113293 Type 2 diabetes mellitus with mild nonproliferative diabetic retinopathy without macular edema, bilateral: Secondary | ICD-10-CM | POA: Diagnosis not present

## 2018-01-17 DIAGNOSIS — H25812 Combined forms of age-related cataract, left eye: Secondary | ICD-10-CM | POA: Diagnosis not present

## 2018-01-17 DIAGNOSIS — H40001 Preglaucoma, unspecified, right eye: Secondary | ICD-10-CM | POA: Diagnosis not present

## 2018-01-18 ENCOUNTER — Emergency Department (HOSPITAL_COMMUNITY): Payer: Medicare HMO

## 2018-01-18 ENCOUNTER — Other Ambulatory Visit: Payer: Self-pay

## 2018-01-18 ENCOUNTER — Encounter (HOSPITAL_COMMUNITY): Payer: Self-pay

## 2018-01-18 ENCOUNTER — Emergency Department (HOSPITAL_COMMUNITY)
Admission: EM | Admit: 2018-01-18 | Discharge: 2018-01-18 | Disposition: A | Discharge status: Home / Self Care | Payer: Medicare HMO | Attending: Emergency Medicine | Admitting: Emergency Medicine

## 2018-01-18 DIAGNOSIS — N2581 Secondary hyperparathyroidism of renal origin: Secondary | ICD-10-CM | POA: Diagnosis not present

## 2018-01-18 DIAGNOSIS — I5032 Chronic diastolic (congestive) heart failure: Secondary | ICD-10-CM | POA: Insufficient documentation

## 2018-01-18 DIAGNOSIS — N186 End stage renal disease: Secondary | ICD-10-CM | POA: Insufficient documentation

## 2018-01-18 DIAGNOSIS — E1122 Type 2 diabetes mellitus with diabetic chronic kidney disease: Secondary | ICD-10-CM | POA: Insufficient documentation

## 2018-01-18 DIAGNOSIS — R0789 Other chest pain: Secondary | ICD-10-CM | POA: Diagnosis not present

## 2018-01-18 DIAGNOSIS — Z87891 Personal history of nicotine dependence: Secondary | ICD-10-CM | POA: Insufficient documentation

## 2018-01-18 DIAGNOSIS — E8770 Fluid overload, unspecified: Secondary | ICD-10-CM | POA: Diagnosis not present

## 2018-01-18 DIAGNOSIS — E039 Hypothyroidism, unspecified: Secondary | ICD-10-CM | POA: Diagnosis not present

## 2018-01-18 DIAGNOSIS — R0902 Hypoxemia: Secondary | ICD-10-CM | POA: Diagnosis not present

## 2018-01-18 DIAGNOSIS — I10 Essential (primary) hypertension: Secondary | ICD-10-CM | POA: Diagnosis not present

## 2018-01-18 DIAGNOSIS — Z8673 Personal history of transient ischemic attack (TIA), and cerebral infarction without residual deficits: Secondary | ICD-10-CM | POA: Diagnosis not present

## 2018-01-18 DIAGNOSIS — Z992 Dependence on renal dialysis: Secondary | ICD-10-CM | POA: Diagnosis not present

## 2018-01-18 DIAGNOSIS — I132 Hypertensive heart and chronic kidney disease with heart failure and with stage 5 chronic kidney disease, or end stage renal disease: Secondary | ICD-10-CM | POA: Insufficient documentation

## 2018-01-18 DIAGNOSIS — Z794 Long term (current) use of insulin: Secondary | ICD-10-CM | POA: Insufficient documentation

## 2018-01-18 DIAGNOSIS — I120 Hypertensive chronic kidney disease with stage 5 chronic kidney disease or end stage renal disease: Secondary | ICD-10-CM | POA: Diagnosis not present

## 2018-01-18 DIAGNOSIS — R079 Chest pain, unspecified: Secondary | ICD-10-CM | POA: Diagnosis not present

## 2018-01-18 DIAGNOSIS — Z79899 Other long term (current) drug therapy: Secondary | ICD-10-CM | POA: Insufficient documentation

## 2018-01-18 DIAGNOSIS — E1165 Type 2 diabetes mellitus with hyperglycemia: Secondary | ICD-10-CM | POA: Diagnosis not present

## 2018-01-18 LAB — I-STAT TROPONIN, ED: Troponin i, poc: 0.01 ng/mL (ref 0.00–0.08)

## 2018-01-18 LAB — CBC
HEMATOCRIT: 29.9 % — AB (ref 39.0–52.0)
HEMOGLOBIN: 10.2 g/dL — AB (ref 13.0–17.0)
MCH: 31.2 pg (ref 26.0–34.0)
MCHC: 34.1 g/dL (ref 30.0–36.0)
MCV: 91.4 fL (ref 80.0–100.0)
Platelets: 188 10*3/uL (ref 150–400)
RBC: 3.27 MIL/uL — ABNORMAL LOW (ref 4.22–5.81)
RDW: 13.1 % (ref 11.5–15.5)
WBC: 6.5 10*3/uL (ref 4.0–10.5)
nRBC: 0 % (ref 0.0–0.2)

## 2018-01-18 LAB — COMPREHENSIVE METABOLIC PANEL
ALK PHOS: 102 U/L (ref 38–126)
ALT: 18 U/L (ref 0–44)
ANION GAP: 13 (ref 5–15)
AST: 22 U/L (ref 15–41)
Albumin: 3.7 g/dL (ref 3.5–5.0)
BILIRUBIN TOTAL: 0.4 mg/dL (ref 0.3–1.2)
BUN: 24 mg/dL — AB (ref 8–23)
CALCIUM: 8.5 mg/dL — AB (ref 8.9–10.3)
CO2: 29 mmol/L (ref 22–32)
Chloride: 93 mmol/L — ABNORMAL LOW (ref 98–111)
Creatinine, Ser: 4.12 mg/dL — ABNORMAL HIGH (ref 0.61–1.24)
GFR calc Af Amer: 16 mL/min — ABNORMAL LOW (ref >60)
GFR, EST NON AFRICAN AMERICAN: 13 mL/min — AB (ref >60)
GLUCOSE: 426 mg/dL — AB (ref 70–99)
POTASSIUM: 3.5 mmol/L (ref 3.5–5.1)
Sodium: 135 mmol/L (ref 135–145)
TOTAL PROTEIN: 7.3 g/dL (ref 6.5–8.1)

## 2018-01-18 LAB — CBG MONITORING, ED: GLUCOSE-CAPILLARY: 445 mg/dL — AB (ref 70–99)

## 2018-01-18 LAB — PROTIME-INR
INR: 1.01
Prothrombin Time: 13.2 seconds (ref 11.4–15.2)

## 2018-01-18 LAB — PHOSPHORUS: PHOSPHORUS: 3.1 mg/dL (ref 2.5–4.6)

## 2018-01-18 LAB — MAGNESIUM: Magnesium: 1.9 mg/dL (ref 1.7–2.4)

## 2018-01-18 MED ORDER — ASPIRIN 81 MG PO CHEW
324.0000 mg | CHEWABLE_TABLET | Freq: Once | ORAL | Status: AC
  Administered 2018-01-18: 324 mg via ORAL
  Filled 2018-01-18: qty 4

## 2018-01-18 NOTE — ED Triage Notes (Addendum)
Per [facility], pt arriving from dialysis where he had 5/10 chest pain 1 hour into dialysis. Pt did not finish treatment. Chest pain resolved prior to EMS arrival. Pt only complaint is chronic bilateral leg pain. Per EMS, EKG unremarkable. No meds given. Pt arrives with right arm dialysis access in place, IV team consulted for de-access.

## 2018-01-18 NOTE — ED Notes (Signed)
ED Provider at bedside. 

## 2018-01-18 NOTE — ED Provider Notes (Signed)
[facility] EMERGENCY DEPARTMENT Provider Note   CSN: [id number] Arrival date & time: 01/18/18  1458     History   Chief Complaint Chief Complaint  Patient presents with  . Chest Pain    HPI Alexander Whitaker is a 67 y.o. male.  Pt presents to the ED today with CP.  The pt said he developed chest pressure about 1 hour into dialysis.  The pt did not finish tx.  He said he took 2 tylenol which relieved his pain.  The pt said cp is gone.  He now has bilateral lower leg pain.  The pt denies sob.  No meds given by EMS.  Pt was evaluated last month the be a candidate for a kidney transplant, but was determined to be a borderline to poor candidate.     Past Medical History:  Diagnosis Date  . Anemia   . Arthritis    "probably in his joints" (11/21/2017)  . CHF (congestive heart failure) ([place]) ~ 2017  . CKD stage 3 due to type 2 diabetes mellitus ([place])    stage 5 Dialysis T/Th/Sa  . CVA (cerebral vascular accident) [facility]) 2007   wife denies residual on 11/21/2017  . Diabetic peripheral neuropathy ([place])   . Dyspnea   . ESRD (end stage renal disease) on dialysis ([place])    "[place]; TTS" (11/21/2017)  . GERD (gastroesophageal reflux disease)    has in the past  . Headache    since going on dialysis  . Hypertension   . Hypothyroidism   . PONV (postoperative nausea and vomiting)   . Restless legs   . Type II diabetes mellitus [facility])     Patient Active Problem List   Diagnosis Date Noted  . Acute encephalopathy 11/21/2017  . Dysarthria 11/21/2017  . ESRD (end stage renal disease) ([place]) 11/21/2017  . Essential hypertension 11/21/2017  . Normocytic anemia 11/21/2017  . Acute renal failure superimposed on chronic kidney disease ([place]) 03/10/2016  . Acute diastolic congestive heart failure ([place]) 03/10/2016  . Diabetes mellitus, type 2 ([place]) 03/10/2016  . Malignant hypertension 03/10/2016  . Hypothyroidism 03/10/2016  . Tobacco use disorder 03/10/2016  . Acute  renal failure (ARF) ([place]) 03/10/2016    Past Surgical History:  Procedure Laterality Date  . AV FISTULA PLACEMENT Right 10/07/2016   Procedure: RIGHT BRACHIOCEPHALIC ARTERIOVENOUS (AV) FISTULA CREATION;  Surgeon: Angelia Mould, MD;  Location: [facility];  Service: Vascular;  Laterality: Right;  . BACK SURGERY    . CATARACT EXTRACTION W/ INTRAOCULAR LENS IMPLANT Left   . CIRCUMCISION  2015  . HERNIA REPAIR  2016   "stomach"  . POSTERIOR LUMBAR FUSION  1990s?   "has bolts and rods in there"        Home Medications    Prior to Admission medications   Medication Sig Start Date End Date Taking? Authorizing Provider  albuterol (PROVENTIL HFA;VENTOLIN HFA) 108 (90 Base) MCG/ACT inhaler Inhale 2 puffs into the lungs every 6 (six) hours as needed for wheezing or shortness of breath.   Yes [provider]  amLODipine (NORVASC) 10 MG tablet Take 1 tablet (10 mg total) by mouth daily. 11/25/17  Yes Patrecia Pour, Christean Grief, MD  calcium acetate (PHOSLO) 667 MG capsule Take 1,334 mg by mouth 3 (three) times daily with meals.   Yes [provider]  camphor-menthol Timoteo Ace) lotion Apply 1 application topically as needed for itching.   Yes [provider]  clonazePAM (KLONOPIN) 2 MG tablet Take 2  mg by mouth 3 (three) times daily as needed for anxiety.   Yes [provider]  famotidine (PEPCID) 40 MG tablet Take 40 mg by mouth daily.   Yes [provider]  gabapentin (NEURONTIN) 300 MG capsule Take 300 mg by mouth 2 (two) times daily.   Yes [provider]  hydrALAZINE (APRESOLINE) 25 MG tablet Take 25 mg by mouth 3 (three) times daily.   Yes [provider]  hydrOXYzine (ATARAX/VISTARIL) 25 MG tablet Take 25 mg by mouth 2 (two) times daily as needed for itching.    Yes [provider]  insulin degludec (TRESIBA FLEXTOUCH) 100 UNIT/ML SOPN FlexTouch Pen Inject 50 Units into the skin daily.   Yes [provider]  Insulin  Detemir (LEVEMIR) 100 UNIT/ML Pen Inject 10 Units into the skin daily at 12 noon. 10/07/16  Yes Alvia Grove, PA-C  levothyroxine (SYNTHROID, LEVOTHROID) 50 MCG tablet Take 50 mcg by mouth daily before breakfast.  09/19/16  Yes [provider]  lidocaine-prilocaine (EMLA) cream Apply 1 application topically See admin instructions. Apply small amount to access site 1 to 2 hours before dialysis. 11/13/17  Yes [provider]  loratadine (CLARITIN) 10 MG tablet Take 10 mg by mouth daily as needed for rhinitis.    Yes [provider]  Melatonin 10 MG CAPS Take 10 mg by mouth at bedtime as needed (sleep).    Yes [provider]  pantoprazole (PROTONIX) 40 MG tablet Take 40 mg by mouth daily.   Yes [provider]  pramipexole (MIRAPEX) 0.5 MG tablet Take 0.5 mg by mouth at bedtime as needed (sleep).   Yes [provider]    Family History Family History  Problem Relation Age of Onset  . Leukemia Mother 58  . CVA Father 22    Social History Social History   Tobacco Use  . Smoking status: Former Smoker    Packs/day: 1.00    Years: 57.00    Pack years: 57.00    Types: Cigarettes    Last attempt to quit: 03/28/2017    Years since quitting: 0.8  . Smokeless tobacco: Never Used  Substance Use Topics  . Alcohol use: Not Currently  . Drug use: Never     Allergies   No known allergies   Review of Systems Review of Systems  Cardiovascular: Positive for chest pain.  All other systems reviewed and are negative.    Physical Exam Updated Vital Signs BP (!) 177/68   Pulse 74   Temp 98.1 F (36.7 C) (Oral)   Resp (!) 22   SpO2 96%   Physical Exam  Constitutional: He is oriented to person, place, and time. He appears well-developed and well-nourished.  HENT:  Head: Normocephalic and atraumatic.  Eyes: Pupils are equal, round, and reactive to light. EOM are normal.  Neck: Normal range of motion. Neck supple.  Cardiovascular:  Normal rate, regular rhythm, intact distal pulses and normal pulses.  Pulmonary/Chest: Effort normal and breath sounds normal.  Abdominal: Soft. Bowel sounds are normal.  Musculoskeletal: Normal range of motion.       Right lower leg: Normal.       Left lower leg: Normal.  RUE AVF good thrill.  Still accessed.  Neurological: He is alert and oriented to person, place, and time.  Skin: Skin is warm and dry. Capillary refill takes less than 2 seconds.  Psychiatric: He has a normal mood and affect. His behavior is normal.  Nursing note and  vitals reviewed.    ED Treatments / Results  Labs (all labs ordered are listed, but only abnormal results are displayed) Labs Reviewed  CBC - Abnormal; Notable for the following components:      Result Value   RBC 3.27 (*)    Hemoglobin 10.2 (*)    HCT 29.9 (*)    All other components within normal limits  COMPREHENSIVE METABOLIC PANEL - Abnormal; Notable for the following components:   Chloride 93 (*)    Glucose, Bld 426 (*)    BUN 24 (*)    Creatinine, Ser 4.12 (*)    Calcium 8.5 (*)    GFR calc non Af Amer 13 (*)    GFR calc Af Amer 16 (*)    All other components within normal limits  CBG MONITORING, ED - Abnormal; Notable for the following components:   Glucose-Capillary 445 (*)    All other components within normal limits  MAGNESIUM  PROTIME-INR  PHOSPHORUS  I-STAT TROPONIN, ED    EKG EKG Interpretation  Date/Time:  Thursday January 18 2018 15:06:06 EDT Ventricular Rate:  78 PR Interval:    QRS Duration: 78 QT Interval:  434 QTC Calculation: 495 R Axis:   -44 Text Interpretation:  Sinus rhythm Atrial premature complex Left anterior fascicular block Anterior infarct, old Nonspecific T abnormalities, lateral leads No significant change since last tracing Confirmed by Isla Pence [phone]) on 01/18/2018 3:12:30 PM   Radiology Dg Chest 2 View  Result Date: 01/18/2018 CLINICAL DATA:  Chest pain EXAM: CHEST - 2 VIEW  COMPARISON:  11/21/2017 FINDINGS: The heart size and mediastinal contours are within normal limits. Both lungs are clear. The visualized skeletal structures are unremarkable. IMPRESSION: No active cardiopulmonary disease. Electronically Signed   By: Donavan Foil M.D.   On: 01/18/2018 16:11    Procedures Procedures (including critical care time)  Medications Ordered in ED Medications  aspirin chewable tablet 324 mg (324 mg Oral Given 01/18/18 1515)     Initial Impression / Assessment and Plan / ED Course  I have reviewed the triage vital signs and the nursing notes.  Pertinent labs & imaging results that were available during my care of the patient were reviewed by me and considered in my medical decision making (see chart for details).    Pt has not had any CP while here.  However, his heart score is 5.  I told him that he needed to stay for observation.  Unfortunately, he refuses.  He said he will come back if his cp returns.  Pt will call the dialysis center tomorrow to go to dialysis tomorrow.  His wife is in the room when I told him that we can't rule out a MI.  He still wants to go home.  He knows to return if worse.  Final Clinical Impressions(s) / ED Diagnoses   Final diagnoses:  Chest pain, unspecified type  ESRD on hemodialysis [facility])    ED Discharge Orders    None       Isla Pence, MD 01/18/18 1625

## 2018-01-18 NOTE — Progress Notes (Signed)
Patient present with right side graft accessed with two daylsis needles.  Both needles de accessed without difficulty.  Pressure held 15 - 20 minutes until hemostasis obtain.  Gauze dressing applied.  ED RN to monitor.

## 2018-01-18 NOTE — ED Notes (Signed)
Patient verbalizes understanding of discharge instructions. Opportunity for questioning and answers were provided. Armband removed by staff, pt discharged from ED. Pt understands risks of leaving prior to all lab work/tests resulting. Pt ambulatory to lobby with family.

## 2018-01-19 DIAGNOSIS — N186 End stage renal disease: Secondary | ICD-10-CM | POA: Diagnosis not present

## 2018-01-19 DIAGNOSIS — N2581 Secondary hyperparathyroidism of renal origin: Secondary | ICD-10-CM | POA: Diagnosis not present

## 2018-01-19 DIAGNOSIS — E8770 Fluid overload, unspecified: Secondary | ICD-10-CM | POA: Diagnosis not present

## 2018-01-20 DIAGNOSIS — E8770 Fluid overload, unspecified: Secondary | ICD-10-CM | POA: Diagnosis not present

## 2018-01-20 DIAGNOSIS — N186 End stage renal disease: Secondary | ICD-10-CM | POA: Diagnosis not present

## 2018-01-20 DIAGNOSIS — N2581 Secondary hyperparathyroidism of renal origin: Secondary | ICD-10-CM | POA: Diagnosis not present

## 2018-01-22 DIAGNOSIS — Z6830 Body mass index (BMI) 30.0-30.9, adult: Secondary | ICD-10-CM | POA: Diagnosis not present

## 2018-01-22 DIAGNOSIS — G2581 Restless legs syndrome: Secondary | ICD-10-CM | POA: Diagnosis not present

## 2018-01-22 DIAGNOSIS — E1165 Type 2 diabetes mellitus with hyperglycemia: Secondary | ICD-10-CM | POA: Diagnosis not present

## 2018-01-22 DIAGNOSIS — Z794 Long term (current) use of insulin: Secondary | ICD-10-CM | POA: Diagnosis not present

## 2018-01-22 DIAGNOSIS — J449 Chronic obstructive pulmonary disease, unspecified: Secondary | ICD-10-CM | POA: Diagnosis not present

## 2018-01-22 DIAGNOSIS — E039 Hypothyroidism, unspecified: Secondary | ICD-10-CM | POA: Diagnosis not present

## 2018-01-22 DIAGNOSIS — E785 Hyperlipidemia, unspecified: Secondary | ICD-10-CM | POA: Diagnosis not present

## 2018-01-22 DIAGNOSIS — I129 Hypertensive chronic kidney disease with stage 1 through stage 4 chronic kidney disease, or unspecified chronic kidney disease: Secondary | ICD-10-CM | POA: Diagnosis not present

## 2018-01-22 DIAGNOSIS — E1129 Type 2 diabetes mellitus with other diabetic kidney complication: Secondary | ICD-10-CM | POA: Diagnosis not present

## 2018-01-23 DIAGNOSIS — E8770 Fluid overload, unspecified: Secondary | ICD-10-CM | POA: Diagnosis not present

## 2018-01-23 DIAGNOSIS — N186 End stage renal disease: Secondary | ICD-10-CM | POA: Diagnosis not present

## 2018-01-23 DIAGNOSIS — N2581 Secondary hyperparathyroidism of renal origin: Secondary | ICD-10-CM | POA: Diagnosis not present

## 2018-01-25 DIAGNOSIS — N2581 Secondary hyperparathyroidism of renal origin: Secondary | ICD-10-CM | POA: Diagnosis not present

## 2018-01-25 DIAGNOSIS — N186 End stage renal disease: Secondary | ICD-10-CM | POA: Diagnosis not present

## 2018-01-25 DIAGNOSIS — E8770 Fluid overload, unspecified: Secondary | ICD-10-CM | POA: Diagnosis not present

## 2018-01-25 DIAGNOSIS — E1129 Type 2 diabetes mellitus with other diabetic kidney complication: Secondary | ICD-10-CM | POA: Diagnosis not present

## 2018-01-25 DIAGNOSIS — Z992 Dependence on renal dialysis: Secondary | ICD-10-CM | POA: Diagnosis not present

## 2018-01-27 DIAGNOSIS — N2581 Secondary hyperparathyroidism of renal origin: Secondary | ICD-10-CM | POA: Diagnosis not present

## 2018-01-27 DIAGNOSIS — N186 End stage renal disease: Secondary | ICD-10-CM | POA: Diagnosis not present

## 2018-01-30 DIAGNOSIS — N2581 Secondary hyperparathyroidism of renal origin: Secondary | ICD-10-CM | POA: Diagnosis not present

## 2018-01-30 DIAGNOSIS — N186 End stage renal disease: Secondary | ICD-10-CM | POA: Diagnosis not present

## 2018-02-01 DIAGNOSIS — N186 End stage renal disease: Secondary | ICD-10-CM | POA: Diagnosis not present

## 2018-02-01 DIAGNOSIS — N2581 Secondary hyperparathyroidism of renal origin: Secondary | ICD-10-CM | POA: Diagnosis not present

## 2018-02-03 DIAGNOSIS — N186 End stage renal disease: Secondary | ICD-10-CM | POA: Diagnosis not present

## 2018-02-03 DIAGNOSIS — N2581 Secondary hyperparathyroidism of renal origin: Secondary | ICD-10-CM | POA: Diagnosis not present

## 2018-02-06 ENCOUNTER — Inpatient Hospital Stay (HOSPITAL_COMMUNITY)
Admission: EM | Admit: 2018-02-06 | Discharge: 2018-02-07 | DRG: 304 | Disposition: A | Discharge status: Home / Self Care | Payer: Medicare HMO | Source: Other Acute Inpatient Hospital | Attending: Pulmonary Disease | Admitting: Pulmonary Disease

## 2018-02-06 ENCOUNTER — Inpatient Hospital Stay (HOSPITAL_COMMUNITY): Payer: Medicare HMO

## 2018-02-06 DIAGNOSIS — Z823 Family history of stroke: Secondary | ICD-10-CM

## 2018-02-06 DIAGNOSIS — D631 Anemia in chronic kidney disease: Secondary | ICD-10-CM | POA: Diagnosis not present

## 2018-02-06 DIAGNOSIS — Z992 Dependence on renal dialysis: Secondary | ICD-10-CM | POA: Diagnosis not present

## 2018-02-06 DIAGNOSIS — I272 Pulmonary hypertension, unspecified: Secondary | ICD-10-CM | POA: Diagnosis present

## 2018-02-06 DIAGNOSIS — D539 Nutritional anemia, unspecified: Secondary | ICD-10-CM | POA: Diagnosis present

## 2018-02-06 DIAGNOSIS — E039 Hypothyroidism, unspecified: Secondary | ICD-10-CM | POA: Diagnosis present

## 2018-02-06 DIAGNOSIS — Z79899 Other long term (current) drug therapy: Secondary | ICD-10-CM

## 2018-02-06 DIAGNOSIS — Z9114 Patient's other noncompliance with medication regimen: Secondary | ICD-10-CM

## 2018-02-06 DIAGNOSIS — Z79890 Hormone replacement therapy (postmenopausal): Secondary | ICD-10-CM

## 2018-02-06 DIAGNOSIS — J449 Chronic obstructive pulmonary disease, unspecified: Secondary | ICD-10-CM | POA: Diagnosis not present

## 2018-02-06 DIAGNOSIS — N2581 Secondary hyperparathyroidism of renal origin: Secondary | ICD-10-CM | POA: Diagnosis not present

## 2018-02-06 DIAGNOSIS — I5031 Acute diastolic (congestive) heart failure: Secondary | ICD-10-CM | POA: Diagnosis present

## 2018-02-06 DIAGNOSIS — Z806 Family history of leukemia: Secondary | ICD-10-CM

## 2018-02-06 DIAGNOSIS — J189 Pneumonia, unspecified organism: Secondary | ICD-10-CM | POA: Diagnosis not present

## 2018-02-06 DIAGNOSIS — R0689 Other abnormalities of breathing: Secondary | ICD-10-CM | POA: Diagnosis not present

## 2018-02-06 DIAGNOSIS — D649 Anemia, unspecified: Secondary | ICD-10-CM | POA: Diagnosis present

## 2018-02-06 DIAGNOSIS — E1165 Type 2 diabetes mellitus with hyperglycemia: Secondary | ICD-10-CM | POA: Diagnosis not present

## 2018-02-06 DIAGNOSIS — I10 Essential (primary) hypertension: Secondary | ICD-10-CM | POA: Diagnosis present

## 2018-02-06 DIAGNOSIS — R000 Tachycardia, unspecified: Secondary | ICD-10-CM | POA: Diagnosis not present

## 2018-02-06 DIAGNOSIS — E11319 Type 2 diabetes mellitus with unspecified diabetic retinopathy without macular edema: Secondary | ICD-10-CM | POA: Diagnosis present

## 2018-02-06 DIAGNOSIS — I5032 Chronic diastolic (congestive) heart failure: Secondary | ICD-10-CM | POA: Diagnosis present

## 2018-02-06 DIAGNOSIS — Z9842 Cataract extraction status, left eye: Secondary | ICD-10-CM | POA: Diagnosis not present

## 2018-02-06 DIAGNOSIS — G2581 Restless legs syndrome: Secondary | ICD-10-CM | POA: Diagnosis present

## 2018-02-06 DIAGNOSIS — I509 Heart failure, unspecified: Secondary | ICD-10-CM | POA: Diagnosis not present

## 2018-02-06 DIAGNOSIS — I161 Hypertensive emergency: Secondary | ICD-10-CM | POA: Diagnosis not present

## 2018-02-06 DIAGNOSIS — A419 Sepsis, unspecified organism: Secondary | ICD-10-CM | POA: Diagnosis not present

## 2018-02-06 DIAGNOSIS — N186 End stage renal disease: Secondary | ICD-10-CM | POA: Diagnosis present

## 2018-02-06 DIAGNOSIS — E1142 Type 2 diabetes mellitus with diabetic polyneuropathy: Secondary | ICD-10-CM | POA: Diagnosis present

## 2018-02-06 DIAGNOSIS — Z87891 Personal history of nicotine dependence: Secondary | ICD-10-CM | POA: Diagnosis not present

## 2018-02-06 DIAGNOSIS — Z8673 Personal history of transient ischemic attack (TIA), and cerebral infarction without residual deficits: Secondary | ICD-10-CM | POA: Diagnosis not present

## 2018-02-06 DIAGNOSIS — E119 Type 2 diabetes mellitus without complications: Secondary | ICD-10-CM

## 2018-02-06 DIAGNOSIS — I120 Hypertensive chronic kidney disease with stage 5 chronic kidney disease or end stage renal disease: Secondary | ICD-10-CM | POA: Diagnosis not present

## 2018-02-06 DIAGNOSIS — R062 Wheezing: Secondary | ICD-10-CM | POA: Diagnosis not present

## 2018-02-06 DIAGNOSIS — Z794 Long term (current) use of insulin: Secondary | ICD-10-CM

## 2018-02-06 DIAGNOSIS — M1990 Unspecified osteoarthritis, unspecified site: Secondary | ICD-10-CM | POA: Diagnosis present

## 2018-02-06 DIAGNOSIS — J9691 Respiratory failure, unspecified with hypoxia: Secondary | ICD-10-CM | POA: Diagnosis present

## 2018-02-06 DIAGNOSIS — K219 Gastro-esophageal reflux disease without esophagitis: Secondary | ICD-10-CM | POA: Diagnosis present

## 2018-02-06 DIAGNOSIS — Z981 Arthrodesis status: Secondary | ICD-10-CM

## 2018-02-06 DIAGNOSIS — E1122 Type 2 diabetes mellitus with diabetic chronic kidney disease: Secondary | ICD-10-CM | POA: Diagnosis present

## 2018-02-06 DIAGNOSIS — R0902 Hypoxemia: Secondary | ICD-10-CM | POA: Diagnosis not present

## 2018-02-06 DIAGNOSIS — Z87442 Personal history of urinary calculi: Secondary | ICD-10-CM | POA: Diagnosis not present

## 2018-02-06 DIAGNOSIS — Z961 Presence of intraocular lens: Secondary | ICD-10-CM | POA: Diagnosis present

## 2018-02-06 DIAGNOSIS — J80 Acute respiratory distress syndrome: Secondary | ICD-10-CM | POA: Diagnosis not present

## 2018-02-06 DIAGNOSIS — J9601 Acute respiratory failure with hypoxia: Secondary | ICD-10-CM | POA: Diagnosis not present

## 2018-02-06 DIAGNOSIS — E8889 Other specified metabolic disorders: Secondary | ICD-10-CM | POA: Diagnosis present

## 2018-02-06 DIAGNOSIS — I132 Hypertensive heart and chronic kidney disease with heart failure and with stage 5 chronic kidney disease, or end stage renal disease: Secondary | ICD-10-CM | POA: Diagnosis not present

## 2018-02-06 DIAGNOSIS — J810 Acute pulmonary edema: Secondary | ICD-10-CM | POA: Diagnosis not present

## 2018-02-06 DIAGNOSIS — I4891 Unspecified atrial fibrillation: Secondary | ICD-10-CM | POA: Diagnosis not present

## 2018-02-06 DIAGNOSIS — R0602 Shortness of breath: Secondary | ICD-10-CM | POA: Diagnosis not present

## 2018-02-06 LAB — POCT I-STAT 3, ART BLOOD GAS (G3+)
ACID-BASE DEFICIT: 6 mmol/L — AB (ref 0.0–2.0)
BICARBONATE: 19.4 mmol/L — AB (ref 20.0–28.0)
O2 Saturation: 99 %
PCO2 ART: 34.6 mmHg (ref 32.0–48.0)
PO2 ART: 124 mmHg — AB (ref 83.0–108.0)
TCO2: 20 mmol/L — ABNORMAL LOW (ref 22–32)
pH, Arterial: 7.356 (ref 7.350–7.450)

## 2018-02-06 LAB — MRSA PCR SCREENING: MRSA by PCR: NEGATIVE

## 2018-02-06 LAB — GLUCOSE, CAPILLARY
GLUCOSE-CAPILLARY: 507 mg/dL — AB (ref 70–99)
GLUCOSE-CAPILLARY: 389 mg/dL — AB (ref 70–99)
GLUCOSE-CAPILLARY: 293 mg/dL — AB (ref 70–99)
GLUCOSE-CAPILLARY: 272 mg/dL — AB (ref 70–99)
GLUCOSE-CAPILLARY: 300 mg/dL — AB (ref 70–99)
GLUCOSE-CAPILLARY: 211 mg/dL — AB (ref 70–99)
GLUCOSE-CAPILLARY: 139 mg/dL — AB (ref 70–99)
GLUCOSE-CAPILLARY: 124 mg/dL — AB (ref 70–99)
Glucose-Capillary: 558 mg/dL (ref 70–99)
Glucose-Capillary: 298 mg/dL — ABNORMAL HIGH (ref 70–99)
Glucose-Capillary: 310 mg/dL — ABNORMAL HIGH (ref 70–99)
Glucose-Capillary: 280 mg/dL — ABNORMAL HIGH (ref 70–99)
Glucose-Capillary: 244 mg/dL — ABNORMAL HIGH (ref 70–99)

## 2018-02-06 LAB — CBC WITH DIFFERENTIAL/PLATELET
ABS IMMATURE GRANULOCYTES: 0.06 10*3/uL (ref 0.00–0.07)
Basophils Absolute: 0 10*3/uL (ref 0.0–0.1)
Basophils Relative: 0 %
Eosinophils Absolute: 0 10*3/uL (ref 0.0–0.5)
Eosinophils Relative: 0 %
HCT: 27.5 % — ABNORMAL LOW (ref 39.0–52.0)
Hemoglobin: 8.9 g/dL — ABNORMAL LOW (ref 13.0–17.0)
Immature Granulocytes: 1 %
Lymphocytes Relative: 3 %
Lymphs Abs: 0.2 10*3/uL — ABNORMAL LOW (ref 0.7–4.0)
MCH: 31 pg (ref 26.0–34.0)
MCHC: 32.4 g/dL (ref 30.0–36.0)
MCV: 95.8 fL (ref 80.0–100.0)
MONO ABS: 0.1 10*3/uL (ref 0.1–1.0)
MONOS PCT: 1 %
NEUTROS ABS: 7.2 10*3/uL (ref 1.7–7.7)
Neutrophils Relative %: 95 %
PLATELETS: 178 10*3/uL (ref 150–400)
RBC: 2.87 MIL/uL — ABNORMAL LOW (ref 4.22–5.81)
RDW: 13.4 % (ref 11.5–15.5)
WBC: 7.7 10*3/uL (ref 4.0–10.5)
nRBC: 0 % (ref 0.0–0.2)

## 2018-02-06 LAB — COMPREHENSIVE METABOLIC PANEL
ALT: 23 U/L (ref 0–44)
ANION GAP: 17 — AB (ref 5–15)
AST: 33 U/L (ref 15–41)
Albumin: 3.2 g/dL — ABNORMAL LOW (ref 3.5–5.0)
Alkaline Phosphatase: 79 U/L (ref 38–126)
BUN: 38 mg/dL — ABNORMAL HIGH (ref 8–23)
CALCIUM: 8.1 mg/dL — AB (ref 8.9–10.3)
CHLORIDE: 101 mmol/L (ref 98–111)
CO2: 18 mmol/L — ABNORMAL LOW (ref 22–32)
Creatinine, Ser: 7.46 mg/dL — ABNORMAL HIGH (ref 0.61–1.24)
GFR calc non Af Amer: 7 mL/min — ABNORMAL LOW (ref >60)
GFR, EST AFRICAN AMERICAN: 8 mL/min — AB (ref >60)
Glucose, Bld: 606 mg/dL (ref 70–99)
Potassium: 4.5 mmol/L (ref 3.5–5.1)
Sodium: 136 mmol/L (ref 135–145)
Total Bilirubin: 0.6 mg/dL (ref 0.3–1.2)
Total Protein: 6.4 g/dL — ABNORMAL LOW (ref 6.5–8.1)

## 2018-02-06 LAB — PROTIME-INR
INR: 1.19
Prothrombin Time: 15 seconds (ref 11.4–15.2)

## 2018-02-06 LAB — MAGNESIUM: Magnesium: 1.6 mg/dL — ABNORMAL LOW (ref 1.7–2.4)

## 2018-02-06 LAB — TROPONIN I
TROPONIN I: 0.09 ng/mL — AB (ref <0.03)
TROPONIN I: 0.11 ng/mL — AB (ref <0.03)
Troponin I: 0.08 ng/mL (ref <0.03)

## 2018-02-06 LAB — BRAIN NATRIURETIC PEPTIDE: B Natriuretic Peptide: 527.2 pg/mL — ABNORMAL HIGH (ref 0.0–100.0)

## 2018-02-06 LAB — PHOSPHORUS: PHOSPHORUS: 3.9 mg/dL (ref 2.5–4.6)

## 2018-02-06 MED ORDER — AMLODIPINE BESYLATE 10 MG PO TABS
10.0000 mg | ORAL_TABLET | Freq: Every day | ORAL | Status: DC
  Administered 2018-02-06 – 2018-02-07 (×2): 10 mg via ORAL
  Filled 2018-02-06 (×2): qty 1

## 2018-02-06 MED ORDER — CALCIUM ACETATE (PHOS BINDER) 667 MG PO CAPS
1334.0000 mg | ORAL_CAPSULE | Freq: Three times a day (TID) | ORAL | Status: DC
  Administered 2018-02-07 (×2): 1334 mg via ORAL
  Filled 2018-02-06 (×2): qty 2

## 2018-02-06 MED ORDER — ONDANSETRON HCL 4 MG/2ML IJ SOLN
4.0000 mg | Freq: Four times a day (QID) | INTRAMUSCULAR | Status: DC | PRN
  Filled 2018-02-06: qty 2

## 2018-02-06 MED ORDER — ALBUTEROL SULFATE (2.5 MG/3ML) 0.083% IN NEBU: 3.0000 mL | INHALATION_SOLUTION | Freq: Four times a day (QID) | RESPIRATORY_TRACT | Status: DC | PRN

## 2018-02-06 MED ORDER — LIDOCAINE-PRILOCAINE 2.5-2.5 % EX CREA
1.0000 "application " | TOPICAL_CREAM | Freq: Every day | CUTANEOUS | Status: DC | PRN
  Filled 2018-02-06: qty 5

## 2018-02-06 MED ORDER — INSULIN REGULAR(HUMAN) IN NACL 100-0.9 UT/100ML-% IV SOLN
INTRAVENOUS | Status: DC
  Administered 2018-02-06: 3.2 [IU]/h via INTRAVENOUS
  Administered 2018-02-06: 4.8 [IU]/h via INTRAVENOUS
  Administered 2018-02-06: 5 [IU]/h via INTRAVENOUS
  Filled 2018-02-06 (×3): qty 100

## 2018-02-06 MED ORDER — PRAMIPEXOLE DIHYDROCHLORIDE 0.25 MG PO TABS
0.5000 mg | ORAL_TABLET | Freq: Every evening | ORAL | Status: DC | PRN
  Filled 2018-02-06: qty 2

## 2018-02-06 MED ORDER — LEVOTHYROXINE SODIUM 50 MCG PO TABS
50.0000 ug | ORAL_TABLET | Freq: Every day | ORAL | Status: DC
  Administered 2018-02-07: 50 ug via ORAL
  Filled 2018-02-06: qty 1

## 2018-02-06 MED ORDER — ASPIRIN 300 MG RE SUPP: 300.0000 mg | RECTAL | Status: AC

## 2018-02-06 MED ORDER — HYDRALAZINE HCL 25 MG PO TABS
25.0000 mg | ORAL_TABLET | Freq: Three times a day (TID) | ORAL | Status: DC
  Administered 2018-02-07 (×2): 25 mg via ORAL
  Filled 2018-02-06 (×2): qty 1

## 2018-02-06 MED ORDER — SODIUM CHLORIDE 0.9 % IV SOLN
INTRAVENOUS | Status: DC | PRN
  Administered 2018-02-06: 500 mL via INTRAVENOUS

## 2018-02-06 MED ORDER — GABAPENTIN 300 MG PO CAPS
300.0000 mg | ORAL_CAPSULE | Freq: Two times a day (BID) | ORAL | Status: DC
  Administered 2018-02-06 – 2018-02-07 (×3): 300 mg via ORAL
  Filled 2018-02-06 (×3): qty 1

## 2018-02-06 MED ORDER — LABETALOL HCL 5 MG/ML IV SOLN: 10.0000 mg | INTRAVENOUS | Status: DC | PRN

## 2018-02-06 MED ORDER — HYDROXYZINE HCL 25 MG PO TABS: 25.0000 mg | ORAL_TABLET | Freq: Two times a day (BID) | ORAL | Status: DC | PRN

## 2018-02-06 MED ORDER — ACETAMINOPHEN 325 MG PO TABS: 650.0000 mg | ORAL_TABLET | ORAL | Status: DC | PRN

## 2018-02-06 MED ORDER — NITROGLYCERIN IN D5W 200-5 MCG/ML-% IV SOLN
0.0000 ug/min | INTRAVENOUS | Status: DC
  Administered 2018-02-06: 50 ug/min via INTRAVENOUS

## 2018-02-06 MED ORDER — SODIUM CHLORIDE 0.9 % IV SOLN: 62.5000 mg | INTRAVENOUS | Status: DC

## 2018-02-06 MED ORDER — HEPARIN SODIUM (PORCINE) 5000 UNIT/ML IJ SOLN
5000.0000 [IU] | Freq: Three times a day (TID) | INTRAMUSCULAR | Status: DC
  Administered 2018-02-06 – 2018-02-07 (×4): 5000 [IU] via SUBCUTANEOUS
  Filled 2018-02-06 (×4): qty 1

## 2018-02-06 MED ORDER — MELATONIN 3 MG PO TABS
9.0000 mg | ORAL_TABLET | Freq: Every evening | ORAL | Status: DC | PRN
  Filled 2018-02-06: qty 3

## 2018-02-06 MED ORDER — ASPIRIN 81 MG PO CHEW
324.0000 mg | CHEWABLE_TABLET | ORAL | Status: AC
  Administered 2018-02-06: 324 mg via ORAL
  Filled 2018-02-06: qty 4

## 2018-02-06 MED ORDER — CLONAZEPAM 1 MG PO TABS
2.0000 mg | ORAL_TABLET | Freq: Three times a day (TID) | ORAL | Status: DC | PRN
  Administered 2018-02-06: 2 mg via ORAL
  Filled 2018-02-06: qty 2

## 2018-02-06 MED ORDER — CAMPHOR-MENTHOL 0.5-0.5 % EX LOTN
1.0000 "application " | TOPICAL_LOTION | CUTANEOUS | Status: DC | PRN
  Administered 2018-02-06: 1 via TOPICAL
  Filled 2018-02-06 (×2): qty 222

## 2018-02-06 MED ORDER — FUROSEMIDE 10 MG/ML IJ SOLN
120.0000 mg | Freq: Once | INTRAVENOUS | Status: DC
  Filled 2018-02-06: qty 12

## 2018-02-06 MED ORDER — FAMOTIDINE 20 MG PO TABS
40.0000 mg | ORAL_TABLET | Freq: Every day | ORAL | Status: DC
  Administered 2018-02-06 – 2018-02-07 (×2): 40 mg via ORAL
  Filled 2018-02-06 (×2): qty 2

## 2018-02-06 MED ORDER — PANTOPRAZOLE SODIUM 40 MG PO TBEC
40.0000 mg | DELAYED_RELEASE_TABLET | Freq: Every day | ORAL | Status: DC
  Administered 2018-02-06 – 2018-02-07 (×2): 40 mg via ORAL
  Filled 2018-02-06 (×2): qty 1

## 2018-02-06 NOTE — Progress Notes (Signed)
RT NOTE: RT transported patient from room 3M08 to HD on bipap with no complications. Vitals are stable. RT will continue to monitor.

## 2018-02-06 NOTE — Procedures (Signed)
   I was present at this dialysis session, have reviewed the session itself and made  appropriate changes Kelly Splinter MD [place] pager [phone]   02/06/2018, 1:01 PM

## 2018-02-06 NOTE — Consult Note (Addendum)
[facility] Renal Consultation Note  Indication for Consultation:  Management of ESRD/hemodialysis; anemia, hypertension/volume and secondary hyperparathyroidism  HPI: Alexander Whitaker is a 67 y.o. male with ESRD 2/2 DM/ HTN on hd since 05/2016 ,  ho CVA, hypothyroidism, Restless leg syndrome,  tobacco abuse("stopped May 2019")prior  [facility] 08/27-08/30/19 Acute Encelphalopathy, HTN urgency, syncope.  Patient and wife give history of progressive SOB  Since last pm.went to [facility] with HTN urgency  sbp 220,CPAP placed ,nebulizer  tx and transferred to [facility] . CXR at [facility] =no active dz . Glucose 606 k 4.5  He has not missed OP TXs ([facility])Recent findings of htn at op unit and Lisinopril added by Dr Justin Mend 10/24 /19 , noted he was sent to [facility] ER that day with CP after bp dropped 190 -80  And released that day EDW was increased and still has not achieved his edw .  No reported fevers, chills ,N/V/D. Wife denies missing Insulin doses maybe getting off diet sometimes .      Past Medical History:  Diagnosis Date  . Anemia   . Arthritis    "probably in his joints" (11/21/2017)  . CHF (congestive heart failure) ([place]) ~ 2017  . CKD stage 3 due to type 2 diabetes mellitus ([place])    stage 5 Dialysis T/Th/Sa  . CVA (cerebral vascular accident) [facility]) 2007   wife denies residual on 11/21/2017  . Diabetic peripheral neuropathy ([place])   . Dyspnea   . ESRD (end stage renal disease) on dialysis ([place])    "[place]; TTS" (11/21/2017)  . GERD (gastroesophageal reflux disease)    has in the past  . Headache    since going on dialysis  . Hypertension   . Hypothyroidism   . PONV (postoperative nausea and vomiting)   . Restless legs   . Type II diabetes mellitus ([place])     Past Surgical History:  Procedure Laterality Date  . AV FISTULA PLACEMENT Right 10/07/2016   Procedure: RIGHT BRACHIOCEPHALIC ARTERIOVENOUS (AV) FISTULA CREATION;  Surgeon: Angelia Mould, MD;  Location:  [facility];  Service: Vascular;  Laterality: Right;  . BACK SURGERY    . CATARACT EXTRACTION W/ INTRAOCULAR LENS IMPLANT Left   . CIRCUMCISION  2015  . HERNIA REPAIR  2016   "stomach"  . POSTERIOR LUMBAR FUSION  1990s?   "has bolts and rods in there"      Family History  Problem Relation Age of Onset  . Leukemia Mother 21  . CVA Father 40      reports that he quit smoking about 10 months ago. His smoking use included cigarettes. He has a 57.00 pack-year smoking history. He has never used smokeless tobacco. He reports that he drank alcohol. He reports that he does not use drugs.   Allergies  Allergen Reactions  . No Known Allergies     Prior to Admission medications   Medication Sig Start Date End Date Taking? Authorizing Provider  albuterol (PROVENTIL HFA;VENTOLIN HFA) 108 (90 Base) MCG/ACT inhaler Inhale 2 puffs into the lungs every 6 (six) hours as needed for wheezing or shortness of breath.    [provider]  amLODipine (NORVASC) 10 MG tablet Take 1 tablet (10 mg total) by mouth daily. 11/25/17   Doreatha Lew, MD  calcium acetate (PHOSLO) 667 MG capsule Take 1,334 mg by mouth 3 (three) times daily with meals.    [provider]  camphor-menthol Timoteo Ace) lotion Apply 1 application  topically as needed for itching.    [provider]  clonazePAM (KLONOPIN) 2 MG tablet Take 2 mg by mouth 3 (three) times daily as needed for anxiety.    [provider]  famotidine (PEPCID) 40 MG tablet Take 40 mg by mouth daily.    [provider]  gabapentin (NEURONTIN) 300 MG capsule Take 300 mg by mouth 2 (two) times daily.    [provider]  hydrALAZINE (APRESOLINE) 25 MG tablet Take 25 mg by mouth 3 (three) times daily.    [provider]  hydrOXYzine (ATARAX/VISTARIL) 25 MG tablet Take 25 mg by mouth 2 (two) times daily as needed for itching.     [provider]  insulin degludec (TRESIBA FLEXTOUCH) 100 UNIT/ML SOPN  FlexTouch Pen Inject 50 Units into the skin daily.    [provider]  Insulin Detemir (LEVEMIR) 100 UNIT/ML Pen Inject 10 Units into the skin daily at 12 noon. 10/07/16   Alvia Grove, PA-C  levothyroxine (SYNTHROID, LEVOTHROID) 50 MCG tablet Take 50 mcg by mouth daily before breakfast.  09/19/16   [provider]  lidocaine-prilocaine (EMLA) cream Apply 1 application topically See admin instructions. Apply small amount to access site 1 to 2 hours before dialysis. 11/13/17   [provider]  loratadine (CLARITIN) 10 MG tablet Take 10 mg by mouth daily as needed for rhinitis.     [provider]  Melatonin 10 MG CAPS Take 10 mg by mouth at bedtime as needed (sleep).     [provider]  pantoprazole (PROTONIX) 40 MG tablet Take 40 mg by mouth daily.    [provider]  pramipexole (MIRAPEX) 0.5 MG tablet Take 0.5 mg by mouth at bedtime as needed (sleep).    [provider]     Anti-infectives (From admission, onward)   None      Results for orders placed or performed during the hospital encounter of 02/06/18 (from the past 48 hour(s))  Glucose, capillary     Status: Abnormal   Collection Time: 02/06/18  7:08 AM  Result Value Ref Range   Glucose-Capillary 507 (HH) 70 - 99 mg/dL   Comment 1 Notify RN   I-STAT 3, arterial blood gas (G3+)     Status: Abnormal   Collection Time: 02/06/18  8:47 AM  Result Value Ref Range   pH, Arterial 7.356 7.350 - 7.450   pCO2 arterial 34.6 32.0 - 48.0 mmHg   pO2, Arterial 124.0 (H) 83.0 - 108.0 mmHg   Bicarbonate 19.4 (L) 20.0 - 28.0 mmol/L   TCO2 20 (L) 22 - 32 mmol/L   O2 Saturation 99.0 %   Acid-base deficit 6.0 (H) 0.0 - 2.0 mmol/L   Patient temperature HIDE    Collection site RADIAL, ALLEN'S TEST ACCEPTABLE    Drawn by Operator    Sample type ARTERIAL   Glucose, capillary     Status: Abnormal   Collection Time: 02/06/18  9:51 AM  Result Value Ref Range   Glucose-Capillary 558  (HH) 70 - 99 mg/dL   Comment 1 Notify RN   Comprehensive metabolic panel     Status: Abnormal   Collection Time: 02/06/18  9:55 AM  Result Value Ref Range   Sodium 136 135 - 145 mmol/L   Potassium 4.5 3.5 - 5.1 mmol/L   Chloride 101 98 - 111 mmol/L   CO2 18 (L) 22 - 32 mmol/L   Glucose, Bld 606 (HH) 70 - 99 mg/dL    Comment:  CRITICAL RESULT CALLED TO, READ BACK BY AND VERIFIED WITH: TURNER,C RN @ 1120 02/06/18 LEONARD,A    BUN 38 (H) 8 - 23 mg/dL   Creatinine, Ser 7.46 (H) 0.61 - 1.24 mg/dL   Calcium 8.1 (L) 8.9 - 10.3 mg/dL   Total Protein 6.4 (L) 6.5 - 8.1 g/dL   Albumin 3.2 (L) 3.5 - 5.0 g/dL   AST 33 15 - 41 U/L   ALT 23 0 - 44 U/L   Alkaline Phosphatase 79 38 - 126 U/L   Total Bilirubin 0.6 0.3 - 1.2 mg/dL   GFR calc non Af Amer 7 (L) >60 mL/min   GFR calc Af Amer 8 (L) >60 mL/min    Comment: (NOTE) The eGFR has been calculated using the CKD EPI equation. This calculation has not been validated in all clinical situations. eGFR's persistently <60 mL/min signify possible Chronic Kidney Disease.    Anion gap 17 (H) 5 - 15    Comment: Performed at [facility], [place] [address] [place], [place] [address]  Magnesium     Status: Abnormal   Collection Time: 02/06/18  9:55 AM  Result Value Ref Range   Magnesium 1.6 (L) 1.7 - 2.4 mg/dL    Comment: Performed at [facility] [address] [place], [place] [address]  Phosphorus     Status: None   Collection Time: 02/06/18  9:55 AM  Result Value Ref Range   Phosphorus 3.9 2.5 - 4.6 mg/dL    Comment: Performed at [facility] [address] [place], [place] [address]  Troponin I - Now Then Q6H     Status: Abnormal   Collection Time: 02/06/18  9:55 AM  Result Value Ref Range   Troponin I 0.09 (HH) <0.03 ng/mL    Comment: CRITICAL RESULT CALLED TO, READ BACK BY AND VERIFIED WITH: TURNER,C RN @ 1120 02/06/18 LEONARD,A Performed at [facility], [place] [address], [place], [place] [address]   Brain  natriuretic peptide     Status: Abnormal   Collection Time: 02/06/18  9:55 AM  Result Value Ref Range   B Natriuretic Peptide 527.2 (H) 0.0 - 100.0 pg/mL    Comment: Performed at [facility] [address] [place], [place] [address]  CBC WITH DIFFERENTIAL     Status: Abnormal   Collection Time: 02/06/18  9:55 AM  Result Value Ref Range   WBC 7.7 4.0 - 10.5 K/uL   RBC 2.87 (L) 4.22 - 5.81 MIL/uL   Hemoglobin 8.9 (L) 13.0 - 17.0 g/dL   HCT 27.5 (L) 39.0 - 52.0 %   MCV 95.8 80.0 - 100.0 fL   MCH 31.0 26.0 - 34.0 pg   MCHC 32.4 30.0 - 36.0 g/dL   RDW 13.4 11.5 - 15.5 %   Platelets 178 150 - 400 K/uL   nRBC 0.0 0.0 - 0.2 %   Neutrophils Relative % 95 %   Neutro Abs 7.2 1.7 - 7.7 K/uL   Lymphocytes Relative 3 %   Lymphs Abs 0.2 (L) 0.7 - 4.0 K/uL   Monocytes Relative 1 %   Monocytes Absolute 0.1 0.1 - 1.0 K/uL   Eosinophils Relative 0 %   Eosinophils Absolute 0.0 0.0 - 0.5 K/uL   Basophils Relative 0 %   Basophils Absolute 0.0 0.0 - 0.1 K/uL   Immature Granulocytes 1 %   Abs Immature Granulocytes 0.06 0.00 - 0.07 K/uL    Comment: Performed at [facility]  [facility], [place] [address], [place], [place] [address]  Protime-INR     Status: None   Collection Time: 02/06/18  9:55 AM  Result Value Ref Range   Prothrombin Time 15.0 11.4 - 15.2 seconds   INR 1.19     Comment: Performed at [facility] [address] [place], [place] [address]    ROS: see hpi   Physical Exam: Vitals:   02/06/18 1048 02/06/18 1049  BP:    Pulse: 98 (!) 101  Resp: 19 19  Temp:    SpO2: 97% 98%     General: alert WM on bipap, NAD, WD, WN  HEENT: [place], noicteric ,MMM  Neck: supple, jvd+ Heart: tachy reg, no m,galop or rub  Lungs: bibasilar rales , on bipap  Abdomen BS pos , soft , NT, ND, no masses Extremities: No pedal edema  Skin: Mild excoriations Left lower leg  Neuro: alert moves extrem to commands  Dialysis Access: RUA AVF  Pos bruit    OPDialysis Orders: Center: [facility]  on TTS  . EDW 80kg HD Bath 2 k, 2.25Ca  Time 4hrs  Heparin NONE.  Access RUA AVF     Mircera 75 mcg q 2 wks hd (last given 01/30/18)    Venofer  50 mg q weekly Hd    Assessment/Plan 1. Hypoxic Respiratory Failure on BiPAP with HTN  Urgency- HD  Today attempt 3l uf  Noted CXR at [facility]  Not showing overt edema ,no active dz / CCM following   2. ESRD -  TTS HD  , hd today on schedule  3. Hypertension/volume  - BP  elevated as at OP Kidney center recently with Lisinopril  added to other med's = amlodipine 10 mg hs , Hydralazine 25 mg tid  4. Anemia  - hgb 8.9  Next esa 11/19 ,weely fe on hd  5. Metabolic bone disease -  Calcium acetate binder  6. Hyperglycemia/ DM type 2 uncontrolled = per admit  7. COPD- per admit   Ernest Haber, PA-C [place] [phone] 02/06/2018, 11:24 AM   Pt seen, examined and agree w A/P as above.  Kelly Splinter MD [employer] pager [phone]   02/06/2018, 1:00 PM

## 2018-02-06 NOTE — Progress Notes (Signed)
CRITICAL VALUE ALERT  Critical Value: Troponin   Date & Time Notied: 02/06/18 23:05  Provider Notified: Warren Lacy RN  Orders Received/Actions taken: RN will notify MD.

## 2018-02-06 NOTE — H&P (Signed)
NAME:  Alexander Whitaker, MRN:  [id number], DOB:  31-Dec-1950, LOS: 0 ADMISSION DATE:  02/06/2018, CONSULTATION DATE:  02/06/2018 REFERRING MD:  Lula Olszewski, CHIEF COMPLAINT:  Dyspnea    Brief History   67 year old man on HD who was transferred from [facility] with hypertensive emergency and dyspnea. History of present illness   Presented with dyspnea to [facility] with blood pressure systolic up to 767 via EMS. Was placed on CPAP and given nebulizer treatment without effect.  [facility] notes indicate that patient sometimes doesn't take anti-HTN medication on non-HD days for fear of hypotension.  Patient's last HD was 11/09, denies non-compliance, NSAID use or dietary non-compliance. Past Medical History  ESRD on HD since 07/2016 due to T2DM - undergoing transplant evaluation at [facility] turned down due inadequate psychosocial readiness. CVA with no residual deficit 2018 COPD Quit smoking x 1 year 56 pack years HFPEF with pulmonary HTN with RVSP 2mmHg. T2DM since 1993 now on insulin with peripheral neuropathy, retinopathy. HTN Nephrolithiasis Restless leg syndrome. No significant surgeries apart from RAVF creation for HD. Remote hernia repair and back surgery.  Significant Hospital Events   Arrived at [facility] in transfer 02/06/2018  Consults:  PCCM 11/12 Nephrology 11/12  Procedures:  N/A  Significant Diagnostic Tests:  Echo 11/22/17, Normal LVSF with G1DD, high filling pressures. RVSP 35 CXR 11/12  Micro Data:  N/A  Antimicrobials:  N/A  Interim history/subjective:  Patient reports breathing has improved since admission. Denies constitutional symptoms, cough or fever, denies chest pain.  Objective    Vital signs: hypertensive with SBP 160 on NTG iv. 104 in SR  Examination: General: older man, poorly kempt.  HENT: normal sclerae, trachea midline.  Lungs: mild accessory muscle use. Vesicular breath sounds with bibasilar crackles. On BiPAP Cardiovascular: JVP at 4cm with HJR. Apex not  displaced. S4+, S2 paradoxically split, 1/6 HSM at apex. Extremities warm but capillary refill delayed. Pedal pulses decreased. Abdomen: protuberant but soft.  Extremities: trace edema. Excoriations on both shins. Neuro: Awake and communicative with no focal deficits. Denies sensory changes in feet. GU: No Foley in place  Resolved Hospital Problem list   N/A  Assessment & Plan:  Critically ill due to acute hypoxic respiratory failure requiring non-invasive ventilation. Remains at high risk for decompensation requiring intubation. Critically ill due to Hypertensive emergency requiring titration of NTG infusion to maintain SBP<180.   Acute pulmonary edema likely cause of respiratory failure. COPD - doesn't presently look exacerbated. Hyperglycemia with poorly controlled diabetes. Hypertension with increasing episodes of hypotension and syncope following HD. Likely represents autonomic neuropathy and increasing arteriosclerosis.  Plan Continue BIPAP on current settings and trial off following dialysis. Continue NGT gtts for now, will likely need to come off during HD as fluid is removed. Continue baseline COPD therapy. Initiate IV insulin to achieve control and transition back to home insulin tomorrow. Diabetes educator. Resume low dose home hypertensive regimen Nephrology to consider switch to PD which may be better tolerated.  Best practice:  Diet: NPO Pain/Anxiety/Delirium protocol (if indicated): none required  VAP protocol (if indicated): not indicated DVT prophylaxis: [place] heparin GI prophylaxis:  Not indicated. Glucose control: IV insulin Mobility: Bedrest while on BiPAP Code Status: Full. Family Communication: wife updated at bedside Disposition: ICU  Labs (reviewed RH results)  CBC: no leukocytosis, mild macrocytic anemia 11.0 No results for input(s): WBC, NEUTROABS, HGB, HCT, MCV, PLT in the last 168 hours.  Basic Metabolic Panel: normal electrolytes, Creatinine 6.80,  lactate  3.4, No results for input(s): NA, K, CL, CO2, GLUCOSE, BUN, CREATININE, CALCIUM, MG, PHOS in the last 168 hours. GFR: CrCl cannot be calculated (Unknown ideal weight.). No results for input(s): PROCALCITON, WBC, LATICACIDVEN in the last 168 hours.  Liver Function Tests: Normal LFT's  No results for input(s): AST, ALT, ALKPHOS, BILITOT, PROT, ALBUMIN in the last 168 hours. No results for input(s): LIPASE, AMYLASE in the last 168 hours. No results for input(s): AMMONIA in the last 168 hours.  ABG No results found for: PHART, PCO2ART, PO2ART, HCO3, TCO2, ACIDBASEDEF, O2SAT   Coagulation Profile: INR 1.1 No results for input(s): INR, PROTIME in the last 168 hours.  Cardiac Enzymes: troponin 0.03 No results for input(s): CKTOTAL, CKMB, CKMBINDEX, TROPONINI in the last 168 hours.  HbA1C: Hgb A1c MFr Bld  Date/Time Value Ref Range Status  11/21/2017 07:05 PM 9.1 (H) 4.8 - 5.6 % Final    Comment:    (NOTE) Pre diabetes:          5.7%-6.4% Diabetes:              >6.4% Glycemic control for   <7.0% adults with diabetes   03/11/2016 04:12 AM 6.7 (H) 4.8 - 5.6 % Final    Comment:    (NOTE)         Pre-diabetes: 5.7 - 6.4         Diabetes: >6.4         Glycemic control for adults with diabetes: <7.0     CBG: uncontrolled hyperglycemia. Recent Labs  Lab 02/06/18 0708  GLUCAP 507*    Review of Systems:   Review of Systems  Constitutional: Negative.  Negative for fever, malaise/fatigue and weight loss.  HENT: Negative.   Eyes:       Bilateral cataracts. Prior injection for retinopathy  Respiratory: Positive for shortness of breath. Negative for cough and sputum production.   Cardiovascular: Negative for chest pain and palpitations.  Gastrointestinal: Positive for constipation.  Genitourinary: Negative.   Musculoskeletal: Negative.   Skin: Positive for itching.  Neurological: Negative.   Endo/Heme/Allergies: Negative.   Psychiatric/Behavioral: Negative.    Past  Medical History  He,  has a past medical history of Anemia, Arthritis, CHF (congestive heart failure) ([place]) (~ 2017), CKD stage 3 due to type 2 diabetes mellitus ([place]), CVA (cerebral vascular accident) ([place]) (2007), Diabetic peripheral neuropathy ([place]), Dyspnea, ESRD (end stage renal disease) on dialysis ([place]), GERD (gastroesophageal reflux disease), Headache, Hypertension, Hypothyroidism, PONV (postoperative nausea and vomiting), Restless legs, and Type II diabetes mellitus ([place]).   Surgical History    Past Surgical History:  Procedure Laterality Date  . AV FISTULA PLACEMENT Right 10/07/2016   Procedure: RIGHT BRACHIOCEPHALIC ARTERIOVENOUS (AV) FISTULA CREATION;  Surgeon: Angelia Mould, MD;  Location: [place];  Service: Vascular;  Laterality: Right;  . BACK SURGERY    . CATARACT EXTRACTION W/ INTRAOCULAR LENS IMPLANT Left   . CIRCUMCISION  2015  . HERNIA REPAIR  2016   "stomach"  . POSTERIOR LUMBAR FUSION  1990s?   "has bolts and rods in there"     Social History   reports that he quit smoking about 10 months ago. His smoking use included cigarettes. He has a 57.00 pack-year smoking history. He has never used smokeless tobacco. He reports that he drank alcohol. He reports that he does not use drugs.   Family History   His family history includes CVA (age of onset: 54) in his father; Leukemia (age of onset: 29) in  his mother.   Allergies Allergies  Allergen Reactions  . No Known Allergies      Home Medications  Prior to Admission medications   Medication Sig Start Date End Date Taking? Authorizing Provider  albuterol (PROVENTIL HFA;VENTOLIN HFA) 108 (90 Base) MCG/ACT inhaler Inhale 2 puffs into the lungs every 6 (six) hours as needed for wheezing or shortness of breath.    [provider]  amLODipine (NORVASC) 10 MG tablet Take 1 tablet (10 mg total) by mouth daily. 11/25/17   Doreatha Lew, MD  calcium acetate (PHOSLO) 667 MG capsule Take 1,334 mg by mouth 3  (three) times daily with meals.    [provider]  camphor-menthol Timoteo Ace) lotion Apply 1 application topically as needed for itching.    [provider]  clonazePAM (KLONOPIN) 2 MG tablet Take 2 mg by mouth 3 (three) times daily as needed for anxiety.    [provider]  famotidine (PEPCID) 40 MG tablet Take 40 mg by mouth daily.    [provider]  gabapentin (NEURONTIN) 300 MG capsule Take 300 mg by mouth 2 (two) times daily.    [provider]  hydrALAZINE (APRESOLINE) 25 MG tablet Take 25 mg by mouth 3 (three) times daily.    [provider]  hydrOXYzine (ATARAX/VISTARIL) 25 MG tablet Take 25 mg by mouth 2 (two) times daily as needed for itching.     [provider]  insulin degludec (TRESIBA FLEXTOUCH) 100 UNIT/ML SOPN FlexTouch Pen Inject 50 Units into the skin daily.    [provider]  Insulin Detemir (LEVEMIR) 100 UNIT/ML Pen Inject 10 Units into the skin daily at 12 noon. 10/07/16   Alvia Grove, PA-C  levothyroxine (SYNTHROID, LEVOTHROID) 50 MCG tablet Take 50 mcg by mouth daily before breakfast.  09/19/16   [provider]  lidocaine-prilocaine (EMLA) cream Apply 1 application topically See admin instructions. Apply small amount to access site 1 to 2 hours before dialysis. 11/13/17   [provider]  loratadine (CLARITIN) 10 MG tablet Take 10 mg by mouth daily as needed for rhinitis.     [provider]  Melatonin 10 MG CAPS Take 10 mg by mouth at bedtime as needed (sleep).     [provider]  pantoprazole (PROTONIX) 40 MG tablet Take 40 mg by mouth daily.    [provider]  pramipexole (MIRAPEX) 0.5 MG tablet Take 0.5 mg by mouth at bedtime as needed (sleep).    [provider]     CRITICAL CARE Performed by: Kipp Brood   Total critical care time: 60 minutes  Critical care time was exclusive of separately billable procedures and treating other  patients.  Critical care was necessary to treat or prevent imminent or life-threatening deterioration.  Critical care was time spent personally by me on the following activities: development of treatment plan with patient and/or surrogate as well as nursing, discussions with consultants, evaluation of patient's response to treatment, examination of patient, obtaining history from patient or surrogate, ordering and performing treatments and interventions, ordering and review of laboratory studies, ordering and review of radiographic studies, pulse oximetry and re-evaluation of patient's condition.     Kipp Brood, MD [facility] ICU Physician [place]  Pager: [phone] Mobile: [phone] After hours: [phone].

## 2018-02-07 DIAGNOSIS — Z794 Long term (current) use of insulin: Secondary | ICD-10-CM

## 2018-02-07 DIAGNOSIS — E1122 Type 2 diabetes mellitus with diabetic chronic kidney disease: Secondary | ICD-10-CM

## 2018-02-07 DIAGNOSIS — I5031 Acute diastolic (congestive) heart failure: Secondary | ICD-10-CM

## 2018-02-07 LAB — CBC
HCT: 28 % — ABNORMAL LOW (ref 39.0–52.0)
HEMOGLOBIN: 9 g/dL — AB (ref 13.0–17.0)
MCH: 30.2 pg (ref 26.0–34.0)
MCHC: 32.1 g/dL (ref 30.0–36.0)
MCV: 94 fL (ref 80.0–100.0)
PLATELETS: 197 10*3/uL (ref 150–400)
RBC: 2.98 MIL/uL — AB (ref 4.22–5.81)
RDW: 13.6 % (ref 11.5–15.5)
WBC: 9.7 10*3/uL (ref 4.0–10.5)
nRBC: 0 % (ref 0.0–0.2)

## 2018-02-07 LAB — BASIC METABOLIC PANEL
ANION GAP: 11 (ref 5–15)
BUN: 24 mg/dL — ABNORMAL HIGH (ref 8–23)
CALCIUM: 8.2 mg/dL — AB (ref 8.9–10.3)
CO2: 26 mmol/L (ref 22–32)
Chloride: 100 mmol/L (ref 98–111)
Creatinine, Ser: 5.06 mg/dL — ABNORMAL HIGH (ref 0.61–1.24)
GFR calc Af Amer: 12 mL/min — ABNORMAL LOW (ref >60)
GFR, EST NON AFRICAN AMERICAN: 11 mL/min — AB (ref >60)
GLUCOSE: 136 mg/dL — AB (ref 70–99)
Potassium: 3.5 mmol/L (ref 3.5–5.1)
Sodium: 137 mmol/L (ref 135–145)

## 2018-02-07 LAB — GLUCOSE, CAPILLARY
GLUCOSE-CAPILLARY: 110 mg/dL — AB (ref 70–99)
GLUCOSE-CAPILLARY: 132 mg/dL — AB (ref 70–99)
GLUCOSE-CAPILLARY: 273 mg/dL — AB (ref 70–99)
Glucose-Capillary: 114 mg/dL — ABNORMAL HIGH (ref 70–99)
Glucose-Capillary: 141 mg/dL — ABNORMAL HIGH (ref 70–99)
Glucose-Capillary: 179 mg/dL — ABNORMAL HIGH (ref 70–99)
Glucose-Capillary: 181 mg/dL — ABNORMAL HIGH (ref 70–99)
Glucose-Capillary: 230 mg/dL — ABNORMAL HIGH (ref 70–99)

## 2018-02-07 LAB — MAGNESIUM: MAGNESIUM: 1.8 mg/dL (ref 1.7–2.4)

## 2018-02-07 MED ORDER — INSULIN DETEMIR 100 UNIT/ML ~~LOC~~ SOLN
5.0000 [IU] | Freq: Two times a day (BID) | SUBCUTANEOUS | Status: DC
  Administered 2018-02-07 (×2): 5 [IU] via SUBCUTANEOUS
  Filled 2018-02-07 (×3): qty 0.05

## 2018-02-07 MED ORDER — INSULIN ASPART 100 UNIT/ML ~~LOC~~ SOLN
1.0000 [IU] | SUBCUTANEOUS | Status: DC
  Administered 2018-02-07: 2 [IU] via SUBCUTANEOUS
  Administered 2018-02-07: 3 [IU] via SUBCUTANEOUS

## 2018-02-07 MED ORDER — FAMOTIDINE 20 MG PO TABS: 20.0000 mg | ORAL_TABLET | Freq: Every day | ORAL | Status: DC

## 2018-02-07 NOTE — Progress Notes (Signed)
   NAME:  Alexander Whitaker, MRN:  [id number], DOB:  22-Sep-1950, LOS: 1 ADMISSION DATE:  02/06/2018, CONSULTATION DATE:  02/06/2018 REFERRING MD:  Lula Olszewski, CHIEF COMPLAINT:  Dyspnea    Brief History   67 year old man on HD who was transferred from [facility] with hypertensive emergency and dyspnea.  Past Medical History  ESRD on HD since 07/2016 due to T2DM - undergoing transplant evaluation at [facility] turned down due inadequate psychosocial readiness. CVA with no residual deficit 2018 COPD Quit smoking x 1 year 56 pack years HFPEF with pulmonary HTN with RVSP 67mmHg. T2DM since 1993 now on insulin with peripheral neuropathy, retinopathy. HTN Nephrolithiasis Restless leg syndrome. No significant surgeries apart from RAVF creation for HD. Remote hernia repair and back surgery.  Significant Hospital Events   Arrived at [facility] in transfer 02/06/2018, nephrology consulted, underwent dialysis in the intensive care.  Minimal troponin leak as high as 0.11 11/13: Blood pressure better controlled.  Denies chest pain or dizziness breathing easier off oxygen Consults:  PCCM 11/12 Nephrology 11/12  Procedures:  N/A  Significant Diagnostic Tests:  Echo 11/22/17, Normal LVSF with G1DD, high filling pressures. RVSP 35 CXR 11/12  Micro Data:  N/A  Antimicrobials:  N/A  Interim history/subjective:  Patient reports breathing has improved since admission. Denies constitutional symptoms, cough or fever, denies chest pain.  Objective    Blood Pressure 129/65   Pulse 72   Temperature 98.5 F (36.9 C) (Oral)   Respiration 20   Weight 81.4 kg   Oxygen Saturation 96%   Body Mass Index 29.86 kg/m  I/O last 3 completed shifts: In: 235.8 [I.V.:235.8] Out: 2991 [Urine:150; Other:2841]  Examination: General: Is chronically ill-appearing 67 year old male he appears much older than stated age she is currently in no acute distress HEENT normocephalic atraumatic no jugular venous distention  mucous membranes Pulmonary: Clear to auscultation without accessory use  Cardiac: Regular rate and rhythm Abdomen: Soft nontender no organomegaly GU: An uric Neuro: Awake oriented no focal deficits Extremities: Warm and dry brisk capillary refill good bruit and thrill on upper extremity AVG   Resolved problem list  Hypertensive emergency  Acute hypoxic respiratory failure   Assessment & Plan:   Difficult to control hypertension.  intermittent syncope and hypotension following dialysis felt likely to represent autonomic neuropathy and increased arterial sclerosis -Had minimal troponin bump in setting of respiratory failure and pulmonary edema -Presented with systolic blood pressure over 220, ranging from 104-162 postdialysis Plan Continue telemetry monitoring  Check orthostatics Mobilize Add diet Continue Norvasc & Hydralazine Will d/w Renal. I think he can probably go home. Would like them to weigh in on antihypertensive regimen as they have been managing.   End-stage renal disease -TTS schedule Plan Per nephrology  COPD -No evidence of exacerbation Plan Continue pulse oximetry PRN albuterol   Anemia of chronic disease -No evidence of bleeding Plan Complete blood count  Diabetes with hyperglycemia -Glycemic control acceptable Plan Continue sliding scale insulin  Hypothyroidism Plan. Continue Synthroid  Best practice:  Diet: NPO Pain/Anxiety/Delirium protocol (if indicated): none required  VAP protocol (if indicated): not indicated DVT prophylaxis: [place] heparin GI prophylaxis:  Not indicated. Glucose control: IV insulin Mobility: Bedrest while on BiPAP Code Status: Full. Family Communication: wife updated at bedside Disposition: anticipate dc this afternoon pending Nephrology f/u.   Erick Colace ACNP-BC [place] Pager # [phone] OR # [phone] if no answer

## 2018-02-07 NOTE — Progress Notes (Signed)
[facility] Progress Note  Subjective: nasal O2, looks comfortable, wants to go home  Vitals:   02/07/18 0801 02/07/18 0804 02/07/18 0900 02/07/18 1000  BP:  (!) 162/70 124/62 129/65  Pulse:   76 72  Resp:   (!) 21 20  Temp: 98.5 F (36.9 C)     TempSrc: Oral     SpO2:   95% 96%  Weight:        Inpatient medications: . amLODipine  10 mg Oral Daily  . calcium acetate  1,334 mg Oral TID WC  . famotidine  40 mg Oral Daily  . gabapentin  300 mg Oral BID  . heparin  5,000 Units Subcutaneous Q8H  . hydrALAZINE  25 mg Oral Q8H  . insulin aspart  1-3 Units Subcutaneous Q4H  . insulin detemir  5 Units Subcutaneous Q12H  . levothyroxine  50 mcg Oral QAC breakfast  . pantoprazole  40 mg Oral Daily   . sodium chloride 10 mL/hr at 02/07/18 0600  . [START ON 02/08/2018] ferric gluconate (FERRLECIT/NULECIT) IV    . furosemide    . nitroGLYCERIN Stopped (02/06/18 1030)   sodium chloride, acetaminophen, albuterol, camphor-menthol, clonazePAM, hydrOXYzine, labetalol, lidocaine-prilocaine, Melatonin, ondansetron (ZOFRAN) IV, pramipexole  Iron/TIBC/Ferritin/ %Sat    Component Value Date/Time   IRON 46 03/11/2016 0412   TIBC 207 (L) 03/11/2016 0412   FERRITIN 222 03/11/2016 0412   IRONPCTSAT 22 03/11/2016 0412    Exam: General: alert WM on bipap, NAD, WD, WN  HEENT: [place], noicteric ,MMM  Neck: supple, jvd+ Heart: tachy reg, no m,galop or rub  Lungs: bibasilar rales , on bipap  Abdomen BS pos , soft , NT, ND, no masses Extremities: No pedal edema  Skin: Mild excoriations Left lower leg  Neuro: alert moves extrem to commands  Dialysis Access: RUA AVF  Pos bruit    OPDialysis Orders: Center: [facility]  on TTS . EDW 80kg HD Bath 2 k, 2.25Ca  Time 4hrs  Heparin NONE.  Access RUA AVF     Mircera 75 mcg q 2 wks hd (last given 01/30/18)    Venofer  50 mg q weekly Hd    Assessment/Plan 1. Acute resp failure/ HTN'sive urgency - was on Bipap weaned off during HD yest, 2.8 L  off , 1kg over edw today. CXR never showed edema, but did respond to volume removal. Per CCM may have some underlying COPD. Going home today. Limit excessive fluid intake. Consider slow decrease in edw. Consider PD.  2. ESRD -  TTS HD.  3. Hypertension/volume - lisinopril recently added, would continue at dc in addition to other meds.  4. Anemia ckd  - hgb 8.9  Next esa 11/19 ,weely fe on hd  5. Metabolic bone disease ckd -  Calcium acetate binder  6. Hyperglycemia/ DM type 2 uncontrolled = per admit  7. COPD- per admit    Kelly Splinter MD [facility] pager [phone]   02/07/2018, 10:39 AM   Recent Labs  Lab 02/06/18 0955 02/07/18 0358  NA 136 137  K 4.5 3.5  CL 101 100  CO2 18* 26  GLUCOSE 606* 136*  BUN 38* 24*  CREATININE 7.46* 5.06*  CALCIUM 8.1* 8.2*  PHOS 3.9  --   ALBUMIN 3.2*  --   INR 1.19  --    Recent Labs  Lab 02/06/18 0955  AST 33  ALT 23  ALKPHOS 79  BILITOT 0.6  PROT 6.4*   Recent Labs  Lab 02/06/18 0955  02/07/18 0358  WBC 7.7 9.7  NEUTROABS 7.2  --   HGB 8.9* 9.0*  HCT 27.5* 28.0*  MCV 95.8 94.0  PLT 178 197

## 2018-02-07 NOTE — Discharge Summary (Addendum)
Physician Discharge Summary         Patient ID: Alexander Whitaker MRN: [id number] DOB/AGE: 1950-08-18 67 y.o.  Admit date: 02/06/2018 Discharge date: 02/07/2018  Discharge Diagnoses:   Hypertensive emergency Difficult to control hypertension Episodes of syncope End-stage renal disease Possible autonomic neuropathy COPD Acute hypoxic respiratory failure Pulmonary edema Diabetes with hyperglycemia Hypothyroidism  Discharge summary    67 year old male patient transferred from [facility] on 11/12 with hypertensive emergency, acute hypoxic respiratory failure in the setting of pulmonary edema.  [facility], systolic blood pressure in the 220s, placed on CPAP without effect.  Review of medical records show he at times does not take his antihypertensives given fear of hypotension.  Recent visit to outpatient nephrology showed he had been started on lisinopril back on October 24.  Subsequently did have an episode of hypotension with associated chest pain requiring observation in the emergency room. He was admitted to the intensive care, support included BiPAP, supplemental oxygen, emergent dialysis, and blood pressure control.  At time of discharge his blood pressure runs from the 120s to 160s.  He has no pain, no shortness of breath, symptoms have resolved.  We discussed his antihypertensive regimen with nephrology they agree that he should be discharged home on his previous antihypertensive regimen of Norvasc, lisinopril, and hydralazine.  They felt like he might be better served by keeping a little higher estimated dry weight.  He will be discharged home at a weight of 81 kg He will follow-up with nephrology for routine dialysis Tuesday Thursday Saturday  Discharge Plan by Active Problems      Difficult to control hypertension.  intermittent syncope and hypotension following dialysis felt likely to represent autonomic neuropathy and increased  arterial sclerosis Plan Home on Norvasc, hydralazine, and lisinopril Nephrology will continue to titrate through outpatient setting   End-stage renal disease -TTS schedule Plan Nephrology outpatient  COPD -No evidence of exacerbation Plan As needed albuterol   Anemia of chronic disease Plan Follow-up as outpatient  Diabetes with hyperglycemia Plan Home regimen  Hypothyroidism Plan. Synthroid  Significant Hospital tests/ studies   Procedures    Culture data/antimicrobials      Consults  Nephrology    Discharge Exam: Blood Pressure (Abnormal) 170/74   Pulse 91   Temperature 98.4 F (36.9 C) (Oral)   Respiration 20   Weight 81.4 kg   Oxygen Saturation 98%   Body Mass Index 29.86 kg/m   Neuro: 67 year old male patient currently resting in chair no acute distress HEENT normocephalic atraumatic no jugular venous distention Pulmonary: Clear to auscultation Cardiac: Regular rate and rhythm without murmur rub or gallop Abdomen: Soft nontender Right upper extremity AV fistula with good bruit and thrill Neuro: Awake oriented GU: An uric  Labs at discharge   Lab Results  Component Value Date   CREATININE 5.06 (H) 02/07/2018   BUN 24 (H) 02/07/2018   NA 137 02/07/2018   K 3.5 02/07/2018   CL 100 02/07/2018   CO2 26 02/07/2018   Lab Results  Component Value Date   WBC 9.7 02/07/2018   HGB 9.0 (L) 02/07/2018   HCT 28.0 (L) 02/07/2018   MCV 94.0 02/07/2018   PLT 197 02/07/2018   Lab Results  Component Value Date   ALT 23 02/06/2018   AST 33 02/06/2018   ALKPHOS 79 02/06/2018   BILITOT 0.6 02/06/2018   Lab Results  Component Value Date   INR 1.19 02/06/2018   INR 1.01 01/18/2018  INR 1.09 03/14/2016    Current radiological studies    Dg Chest Port 1 View  Result Date: 02/06/2018 CLINICAL DATA:  Pulmonary edema, shortness of breath EXAM: PORTABLE CHEST 1 VIEW COMPARISON:  02/06/2018 FINDINGS: Cardiomegaly. Coarsened increased  markings in the lung bases, likely scarring/fibrosis. No overt edema, confluent opacity or effusion. No acute bony abnormality. IMPRESSION: Probable scarring/fibrosis in the lung bases. Cardiomegaly. No active disease. Electronically Signed   By: Rolm Baptise M.D.   On: 02/06/2018 08:53    Disposition:    Discharge disposition: 01-Home or Self Care       Discharge Instructions    Diet - low sodium heart healthy   Complete by:  As directed    Increase activity slowly   Complete by:  As directed       Allergies as of 02/07/2018   No Known Allergies     Medication List    Take these medications   albuterol 108 (90 Base) MCG/ACT inhaler Commonly known as:  PROVENTIL HFA;VENTOLIN HFA Inhale 2 puffs into the lungs every 6 (six) hours as needed for wheezing or shortness of breath.   amLODipine 10 MG tablet Commonly known as:  NORVASC Take 1 tablet (10 mg total) by mouth daily.   calcium acetate 667 MG capsule Commonly known as:  PHOSLO Take 1,334 mg by mouth 3 (three) times daily with meals.   camphor-menthol lotion Commonly known as:  SARNA Apply 1 application topically as needed for itching.   clonazePAM 2 MG tablet Commonly known as:  KLONOPIN Take 2 mg by mouth 3 (three) times daily as needed for anxiety.   famotidine 40 MG tablet Commonly known as:  PEPCID Take 40 mg by mouth daily.   gabapentin 300 MG capsule Commonly known as:  NEURONTIN Take 300 mg by mouth 2 (two) times daily.   hydrALAZINE 25 MG tablet Commonly known as:  APRESOLINE Take 25 mg by mouth 3 (three) times daily.   hydrOXYzine 25 MG tablet Commonly known as:  ATARAX/VISTARIL Take 25 mg by mouth 2 (two) times daily as needed for itching.   Insulin Detemir 100 UNIT/ML Pen Commonly known as:  LEVEMIR Inject 10 Units into the skin daily at 12 noon.   levothyroxine 50 MCG tablet Commonly known as:  SYNTHROID, LEVOTHROID Take 50 mcg by mouth daily before breakfast.   lidocaine-prilocaine  cream Commonly known as:  EMLA Apply 1 application topically See admin instructions. Apply small amount to access site 1 to 2 hours before dialysis.   lisinopril 5 MG tablet Commonly known as:  PRINIVIL,ZESTRIL Take 5 mg by mouth every evening.   loratadine 10 MG tablet Commonly known as:  CLARITIN Take 10 mg by mouth daily as needed for rhinitis.   Melatonin 10 MG Caps Take 10 mg by mouth at bedtime as needed (sleep).   pantoprazole 40 MG tablet Commonly known as:  PROTONIX Take 40 mg by mouth daily.   pramipexole 0.5 MG tablet Commonly known as:  MIRAPEX Take 0.5 mg by mouth at bedtime as needed (sleep).   TRESIBA FLEXTOUCH 100 UNIT/ML Sopn FlexTouch Pen Generic drug:  insulin degludec Inject 50 Units into the skin daily.        Follow-up appointment   W/ HD clinic on Thursday  Discharge Condition:    good  Physician Statement:   The Patient was personally examined, the discharge assessment and plan has been personally reviewed and I agree with ACNP Arnelle Nale's assessment and plan. 34 minutes of time  have been dedicated to discharge assessment, planning and discharge instructions.   Signed: Clementeen Graham 02/07/2018, 4:11 PM

## 2018-02-08 DIAGNOSIS — N186 End stage renal disease: Secondary | ICD-10-CM | POA: Diagnosis not present

## 2018-02-08 DIAGNOSIS — N2581 Secondary hyperparathyroidism of renal origin: Secondary | ICD-10-CM | POA: Diagnosis not present

## 2018-02-09 DIAGNOSIS — I161 Hypertensive emergency: Secondary | ICD-10-CM | POA: Diagnosis not present

## 2018-02-09 DIAGNOSIS — K219 Gastro-esophageal reflux disease without esophagitis: Secondary | ICD-10-CM | POA: Diagnosis not present

## 2018-02-09 DIAGNOSIS — G2581 Restless legs syndrome: Secondary | ICD-10-CM | POA: Diagnosis not present

## 2018-02-10 DIAGNOSIS — N186 End stage renal disease: Secondary | ICD-10-CM | POA: Diagnosis not present

## 2018-02-10 DIAGNOSIS — N2581 Secondary hyperparathyroidism of renal origin: Secondary | ICD-10-CM | POA: Diagnosis not present

## 2018-02-13 DIAGNOSIS — N2581 Secondary hyperparathyroidism of renal origin: Secondary | ICD-10-CM | POA: Diagnosis not present

## 2018-02-13 DIAGNOSIS — N186 End stage renal disease: Secondary | ICD-10-CM | POA: Diagnosis not present

## 2018-02-15 DIAGNOSIS — N186 End stage renal disease: Secondary | ICD-10-CM | POA: Diagnosis not present

## 2018-02-15 DIAGNOSIS — N2581 Secondary hyperparathyroidism of renal origin: Secondary | ICD-10-CM | POA: Diagnosis not present

## 2018-02-17 DIAGNOSIS — N2581 Secondary hyperparathyroidism of renal origin: Secondary | ICD-10-CM | POA: Diagnosis not present

## 2018-02-17 DIAGNOSIS — N186 End stage renal disease: Secondary | ICD-10-CM | POA: Diagnosis not present

## 2018-02-20 DIAGNOSIS — N2581 Secondary hyperparathyroidism of renal origin: Secondary | ICD-10-CM | POA: Diagnosis not present

## 2018-02-20 DIAGNOSIS — N186 End stage renal disease: Secondary | ICD-10-CM | POA: Diagnosis not present

## 2018-02-22 DIAGNOSIS — N186 End stage renal disease: Secondary | ICD-10-CM | POA: Diagnosis not present

## 2018-02-22 DIAGNOSIS — N2581 Secondary hyperparathyroidism of renal origin: Secondary | ICD-10-CM | POA: Diagnosis not present

## 2018-02-24 DIAGNOSIS — N186 End stage renal disease: Secondary | ICD-10-CM | POA: Diagnosis not present

## 2018-02-24 DIAGNOSIS — Z992 Dependence on renal dialysis: Secondary | ICD-10-CM | POA: Diagnosis not present

## 2018-02-24 DIAGNOSIS — E1129 Type 2 diabetes mellitus with other diabetic kidney complication: Secondary | ICD-10-CM | POA: Diagnosis not present

## 2018-02-24 DIAGNOSIS — N2581 Secondary hyperparathyroidism of renal origin: Secondary | ICD-10-CM | POA: Diagnosis not present

## 2018-02-27 DIAGNOSIS — N2581 Secondary hyperparathyroidism of renal origin: Secondary | ICD-10-CM | POA: Diagnosis not present

## 2018-02-27 DIAGNOSIS — N186 End stage renal disease: Secondary | ICD-10-CM | POA: Diagnosis not present

## 2018-02-28 DIAGNOSIS — M25539 Pain in unspecified wrist: Secondary | ICD-10-CM | POA: Diagnosis not present

## 2018-03-01 DIAGNOSIS — N2581 Secondary hyperparathyroidism of renal origin: Secondary | ICD-10-CM | POA: Diagnosis not present

## 2018-03-01 DIAGNOSIS — N186 End stage renal disease: Secondary | ICD-10-CM | POA: Diagnosis not present

## 2018-03-03 DIAGNOSIS — N186 End stage renal disease: Secondary | ICD-10-CM | POA: Diagnosis not present

## 2018-03-03 DIAGNOSIS — N2581 Secondary hyperparathyroidism of renal origin: Secondary | ICD-10-CM | POA: Diagnosis not present

## 2018-03-06 DIAGNOSIS — N2581 Secondary hyperparathyroidism of renal origin: Secondary | ICD-10-CM | POA: Diagnosis not present

## 2018-03-06 DIAGNOSIS — N186 End stage renal disease: Secondary | ICD-10-CM | POA: Diagnosis not present

## 2018-03-08 DIAGNOSIS — N186 End stage renal disease: Secondary | ICD-10-CM | POA: Diagnosis not present

## 2018-03-08 DIAGNOSIS — N2581 Secondary hyperparathyroidism of renal origin: Secondary | ICD-10-CM | POA: Diagnosis not present

## 2018-03-09 DIAGNOSIS — J208 Acute bronchitis due to other specified organisms: Secondary | ICD-10-CM | POA: Diagnosis not present

## 2018-03-09 DIAGNOSIS — R6889 Other general symptoms and signs: Secondary | ICD-10-CM | POA: Diagnosis not present

## 2018-03-10 DIAGNOSIS — N2581 Secondary hyperparathyroidism of renal origin: Secondary | ICD-10-CM | POA: Diagnosis not present

## 2018-03-10 DIAGNOSIS — N186 End stage renal disease: Secondary | ICD-10-CM | POA: Diagnosis not present

## 2018-03-13 DIAGNOSIS — N186 End stage renal disease: Secondary | ICD-10-CM | POA: Diagnosis not present

## 2018-03-13 DIAGNOSIS — N2581 Secondary hyperparathyroidism of renal origin: Secondary | ICD-10-CM | POA: Diagnosis not present

## 2018-03-15 DIAGNOSIS — N2581 Secondary hyperparathyroidism of renal origin: Secondary | ICD-10-CM | POA: Diagnosis not present

## 2018-03-15 DIAGNOSIS — N186 End stage renal disease: Secondary | ICD-10-CM | POA: Diagnosis not present

## 2018-03-17 DIAGNOSIS — N2581 Secondary hyperparathyroidism of renal origin: Secondary | ICD-10-CM | POA: Diagnosis not present

## 2018-03-17 DIAGNOSIS — N186 End stage renal disease: Secondary | ICD-10-CM | POA: Diagnosis not present

## 2018-03-19 DIAGNOSIS — N186 End stage renal disease: Secondary | ICD-10-CM | POA: Diagnosis not present

## 2018-03-19 DIAGNOSIS — N2581 Secondary hyperparathyroidism of renal origin: Secondary | ICD-10-CM | POA: Diagnosis not present

## 2018-03-22 DIAGNOSIS — N186 End stage renal disease: Secondary | ICD-10-CM | POA: Diagnosis not present

## 2018-03-22 DIAGNOSIS — N2581 Secondary hyperparathyroidism of renal origin: Secondary | ICD-10-CM | POA: Diagnosis not present

## 2018-03-24 DIAGNOSIS — N2581 Secondary hyperparathyroidism of renal origin: Secondary | ICD-10-CM | POA: Diagnosis not present

## 2018-03-24 DIAGNOSIS — N186 End stage renal disease: Secondary | ICD-10-CM | POA: Diagnosis not present

## 2018-03-26 DIAGNOSIS — N186 End stage renal disease: Secondary | ICD-10-CM | POA: Diagnosis not present

## 2018-03-26 DIAGNOSIS — N2581 Secondary hyperparathyroidism of renal origin: Secondary | ICD-10-CM | POA: Diagnosis not present

## 2018-03-27 DIAGNOSIS — Z992 Dependence on renal dialysis: Secondary | ICD-10-CM | POA: Diagnosis not present

## 2018-03-27 DIAGNOSIS — E1129 Type 2 diabetes mellitus with other diabetic kidney complication: Secondary | ICD-10-CM | POA: Diagnosis not present

## 2018-03-27 DIAGNOSIS — N186 End stage renal disease: Secondary | ICD-10-CM | POA: Diagnosis not present

## 2018-03-29 DIAGNOSIS — N186 End stage renal disease: Secondary | ICD-10-CM | POA: Diagnosis not present

## 2018-03-29 DIAGNOSIS — N2581 Secondary hyperparathyroidism of renal origin: Secondary | ICD-10-CM | POA: Diagnosis not present

## 2018-03-31 DIAGNOSIS — N2581 Secondary hyperparathyroidism of renal origin: Secondary | ICD-10-CM | POA: Diagnosis not present

## 2018-03-31 DIAGNOSIS — N186 End stage renal disease: Secondary | ICD-10-CM | POA: Diagnosis not present

## 2018-04-03 DIAGNOSIS — N2581 Secondary hyperparathyroidism of renal origin: Secondary | ICD-10-CM | POA: Diagnosis not present

## 2018-04-03 DIAGNOSIS — N186 End stage renal disease: Secondary | ICD-10-CM | POA: Diagnosis not present

## 2018-04-05 DIAGNOSIS — N2581 Secondary hyperparathyroidism of renal origin: Secondary | ICD-10-CM | POA: Diagnosis not present

## 2018-04-05 DIAGNOSIS — N186 End stage renal disease: Secondary | ICD-10-CM | POA: Diagnosis not present

## 2018-04-06 DIAGNOSIS — K219 Gastro-esophageal reflux disease without esophagitis: Secondary | ICD-10-CM | POA: Diagnosis not present

## 2018-04-06 DIAGNOSIS — I951 Orthostatic hypotension: Secondary | ICD-10-CM | POA: Diagnosis not present

## 2018-04-06 DIAGNOSIS — F419 Anxiety disorder, unspecified: Secondary | ICD-10-CM | POA: Diagnosis not present

## 2018-04-07 DIAGNOSIS — N186 End stage renal disease: Secondary | ICD-10-CM | POA: Diagnosis not present

## 2018-04-07 DIAGNOSIS — N2581 Secondary hyperparathyroidism of renal origin: Secondary | ICD-10-CM | POA: Diagnosis not present

## 2018-04-10 DIAGNOSIS — N2581 Secondary hyperparathyroidism of renal origin: Secondary | ICD-10-CM | POA: Diagnosis not present

## 2018-04-10 DIAGNOSIS — N186 End stage renal disease: Secondary | ICD-10-CM | POA: Diagnosis not present

## 2018-04-12 DIAGNOSIS — N186 End stage renal disease: Secondary | ICD-10-CM | POA: Diagnosis not present

## 2018-04-12 DIAGNOSIS — N2581 Secondary hyperparathyroidism of renal origin: Secondary | ICD-10-CM | POA: Diagnosis not present

## 2018-04-14 DIAGNOSIS — N2581 Secondary hyperparathyroidism of renal origin: Secondary | ICD-10-CM | POA: Diagnosis not present

## 2018-04-14 DIAGNOSIS — N186 End stage renal disease: Secondary | ICD-10-CM | POA: Diagnosis not present

## 2018-04-17 DIAGNOSIS — R079 Chest pain, unspecified: Secondary | ICD-10-CM | POA: Diagnosis not present

## 2018-04-17 DIAGNOSIS — Z992 Dependence on renal dialysis: Secondary | ICD-10-CM | POA: Diagnosis not present

## 2018-04-17 DIAGNOSIS — E1122 Type 2 diabetes mellitus with diabetic chronic kidney disease: Secondary | ICD-10-CM | POA: Diagnosis not present

## 2018-04-17 DIAGNOSIS — I509 Heart failure, unspecified: Secondary | ICD-10-CM | POA: Diagnosis not present

## 2018-04-17 DIAGNOSIS — N186 End stage renal disease: Secondary | ICD-10-CM | POA: Diagnosis not present

## 2018-04-17 DIAGNOSIS — I480 Paroxysmal atrial fibrillation: Secondary | ICD-10-CM | POA: Diagnosis not present

## 2018-04-17 DIAGNOSIS — I132 Hypertensive heart and chronic kidney disease with heart failure and with stage 5 chronic kidney disease, or end stage renal disease: Secondary | ICD-10-CM | POA: Diagnosis not present

## 2018-04-17 DIAGNOSIS — N2581 Secondary hyperparathyroidism of renal origin: Secondary | ICD-10-CM | POA: Diagnosis not present

## 2018-04-17 DIAGNOSIS — Z8673 Personal history of transient ischemic attack (TIA), and cerebral infarction without residual deficits: Secondary | ICD-10-CM | POA: Diagnosis not present

## 2018-04-17 DIAGNOSIS — I4891 Unspecified atrial fibrillation: Secondary | ICD-10-CM | POA: Diagnosis not present

## 2018-04-19 DIAGNOSIS — N186 End stage renal disease: Secondary | ICD-10-CM | POA: Diagnosis not present

## 2018-04-19 DIAGNOSIS — N2581 Secondary hyperparathyroidism of renal origin: Secondary | ICD-10-CM | POA: Diagnosis not present

## 2018-04-21 DIAGNOSIS — N186 End stage renal disease: Secondary | ICD-10-CM | POA: Diagnosis not present

## 2018-04-21 DIAGNOSIS — N2581 Secondary hyperparathyroidism of renal origin: Secondary | ICD-10-CM | POA: Diagnosis not present

## 2018-04-24 DIAGNOSIS — N186 End stage renal disease: Secondary | ICD-10-CM | POA: Diagnosis not present

## 2018-04-24 DIAGNOSIS — N2581 Secondary hyperparathyroidism of renal origin: Secondary | ICD-10-CM | POA: Diagnosis not present

## 2018-04-26 DIAGNOSIS — N2581 Secondary hyperparathyroidism of renal origin: Secondary | ICD-10-CM | POA: Diagnosis not present

## 2018-04-26 DIAGNOSIS — N186 End stage renal disease: Secondary | ICD-10-CM | POA: Diagnosis not present

## 2018-04-27 DIAGNOSIS — E1129 Type 2 diabetes mellitus with other diabetic kidney complication: Secondary | ICD-10-CM | POA: Diagnosis not present

## 2018-04-27 DIAGNOSIS — Z992 Dependence on renal dialysis: Secondary | ICD-10-CM | POA: Diagnosis not present

## 2018-04-27 DIAGNOSIS — N186 End stage renal disease: Secondary | ICD-10-CM | POA: Diagnosis not present

## 2018-04-28 DIAGNOSIS — N186 End stage renal disease: Secondary | ICD-10-CM | POA: Diagnosis not present

## 2018-04-28 DIAGNOSIS — N2581 Secondary hyperparathyroidism of renal origin: Secondary | ICD-10-CM | POA: Diagnosis not present

## 2018-05-01 DIAGNOSIS — N186 End stage renal disease: Secondary | ICD-10-CM | POA: Diagnosis not present

## 2018-05-01 DIAGNOSIS — N2581 Secondary hyperparathyroidism of renal origin: Secondary | ICD-10-CM | POA: Diagnosis not present

## 2018-05-03 DIAGNOSIS — N186 End stage renal disease: Secondary | ICD-10-CM | POA: Diagnosis not present

## 2018-05-03 DIAGNOSIS — N2581 Secondary hyperparathyroidism of renal origin: Secondary | ICD-10-CM | POA: Diagnosis not present

## 2018-05-05 DIAGNOSIS — N186 End stage renal disease: Secondary | ICD-10-CM | POA: Diagnosis not present

## 2018-05-05 DIAGNOSIS — N2581 Secondary hyperparathyroidism of renal origin: Secondary | ICD-10-CM | POA: Diagnosis not present

## 2018-05-08 DIAGNOSIS — N2581 Secondary hyperparathyroidism of renal origin: Secondary | ICD-10-CM | POA: Diagnosis not present

## 2018-05-08 DIAGNOSIS — N186 End stage renal disease: Secondary | ICD-10-CM | POA: Diagnosis not present

## 2018-05-10 DIAGNOSIS — N2581 Secondary hyperparathyroidism of renal origin: Secondary | ICD-10-CM | POA: Diagnosis not present

## 2018-05-10 DIAGNOSIS — N186 End stage renal disease: Secondary | ICD-10-CM | POA: Diagnosis not present

## 2018-05-12 DIAGNOSIS — N2581 Secondary hyperparathyroidism of renal origin: Secondary | ICD-10-CM | POA: Diagnosis not present

## 2018-05-12 DIAGNOSIS — N186 End stage renal disease: Secondary | ICD-10-CM | POA: Diagnosis not present

## 2018-05-15 DIAGNOSIS — N186 End stage renal disease: Secondary | ICD-10-CM | POA: Diagnosis not present

## 2018-05-15 DIAGNOSIS — N2581 Secondary hyperparathyroidism of renal origin: Secondary | ICD-10-CM | POA: Diagnosis not present

## 2018-05-17 DIAGNOSIS — N2581 Secondary hyperparathyroidism of renal origin: Secondary | ICD-10-CM | POA: Diagnosis not present

## 2018-05-17 DIAGNOSIS — N186 End stage renal disease: Secondary | ICD-10-CM | POA: Diagnosis not present

## 2018-05-19 DIAGNOSIS — N186 End stage renal disease: Secondary | ICD-10-CM | POA: Diagnosis not present

## 2018-05-19 DIAGNOSIS — N2581 Secondary hyperparathyroidism of renal origin: Secondary | ICD-10-CM | POA: Diagnosis not present

## 2018-05-22 DIAGNOSIS — N186 End stage renal disease: Secondary | ICD-10-CM | POA: Diagnosis not present

## 2018-05-22 DIAGNOSIS — N2581 Secondary hyperparathyroidism of renal origin: Secondary | ICD-10-CM | POA: Diagnosis not present

## 2018-05-24 DIAGNOSIS — N186 End stage renal disease: Secondary | ICD-10-CM | POA: Diagnosis not present

## 2018-05-24 DIAGNOSIS — N2581 Secondary hyperparathyroidism of renal origin: Secondary | ICD-10-CM | POA: Diagnosis not present

## 2018-05-25 IMAGING — DX DG CHEST 2V
2 series · 2 of 2 positions shown · non-contrast
Comparison: 03/09/2016

CLINICAL DATA: Shortness of Breath

EXAM:
CHEST  2 VIEW

[w chest pa]
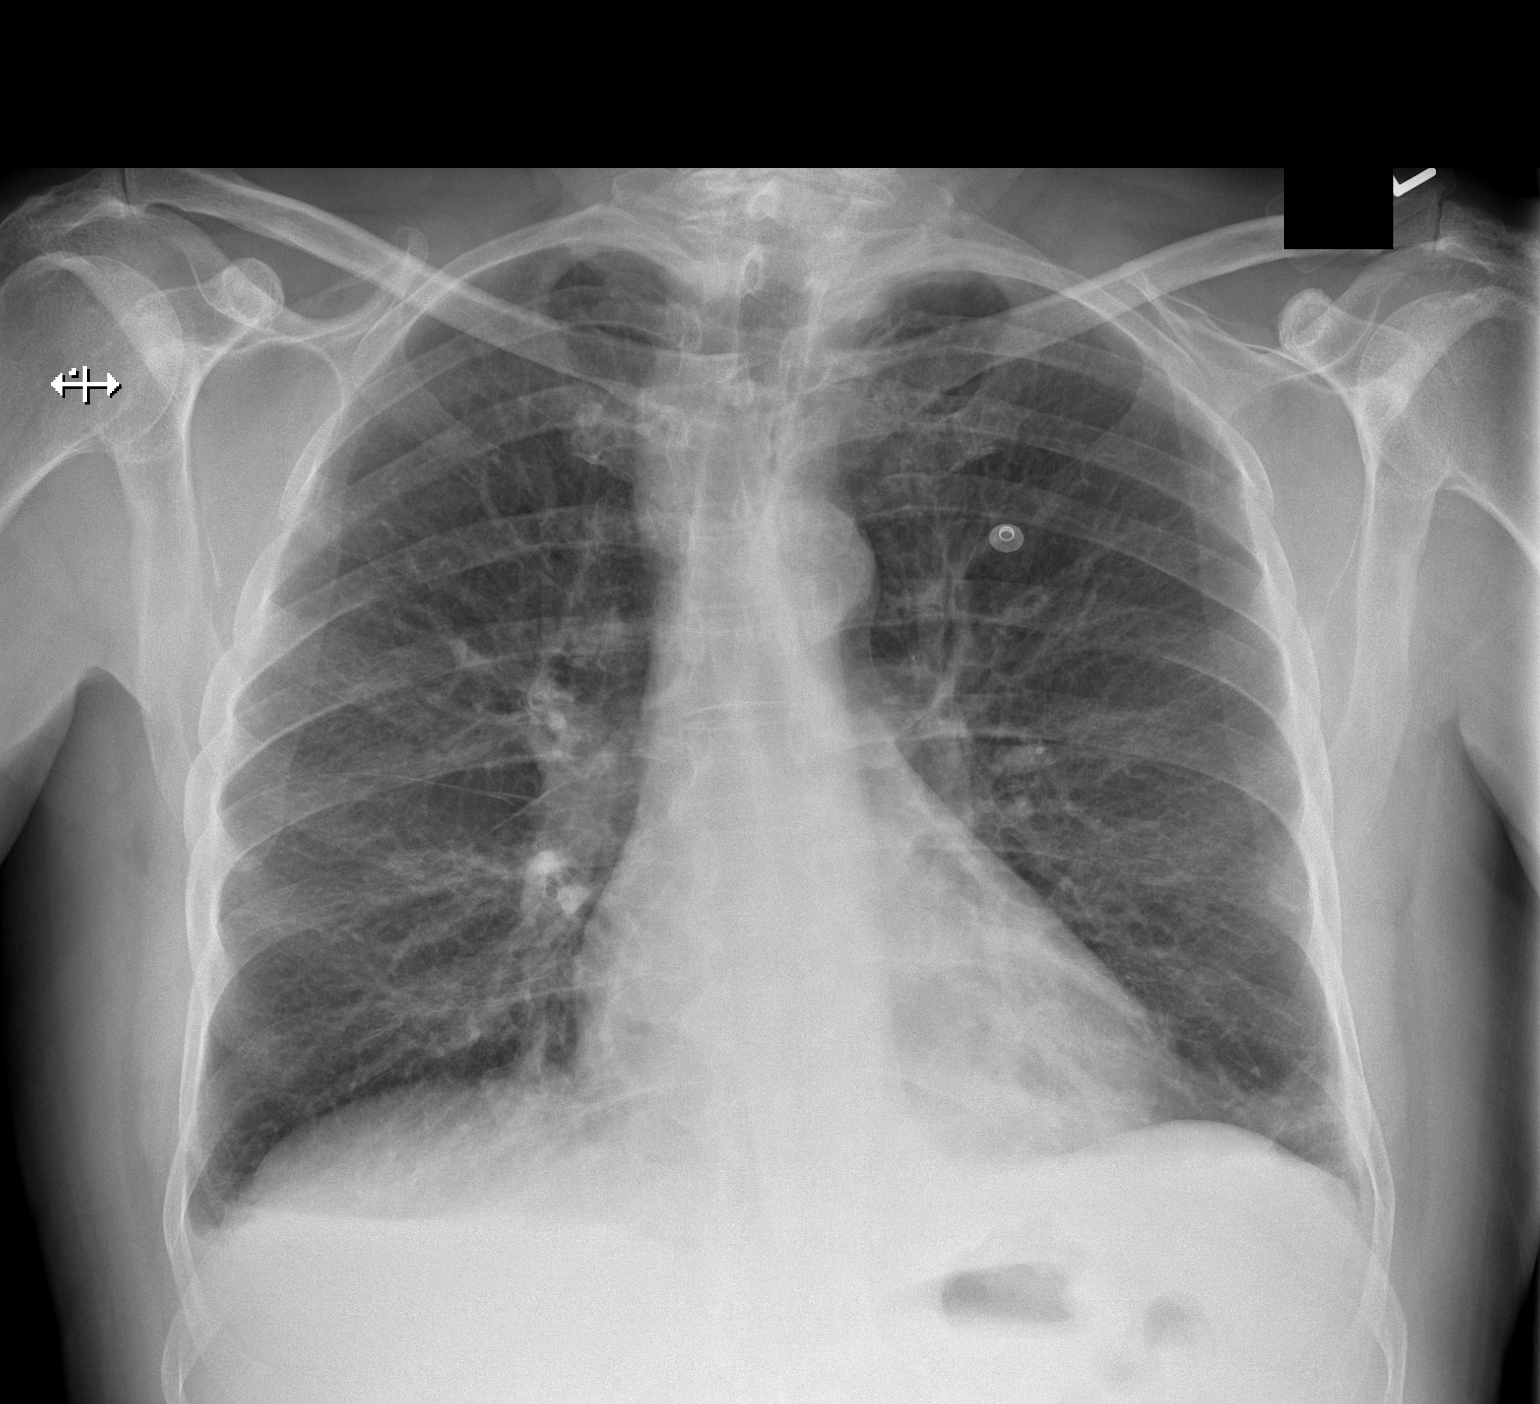

[w chest lat]
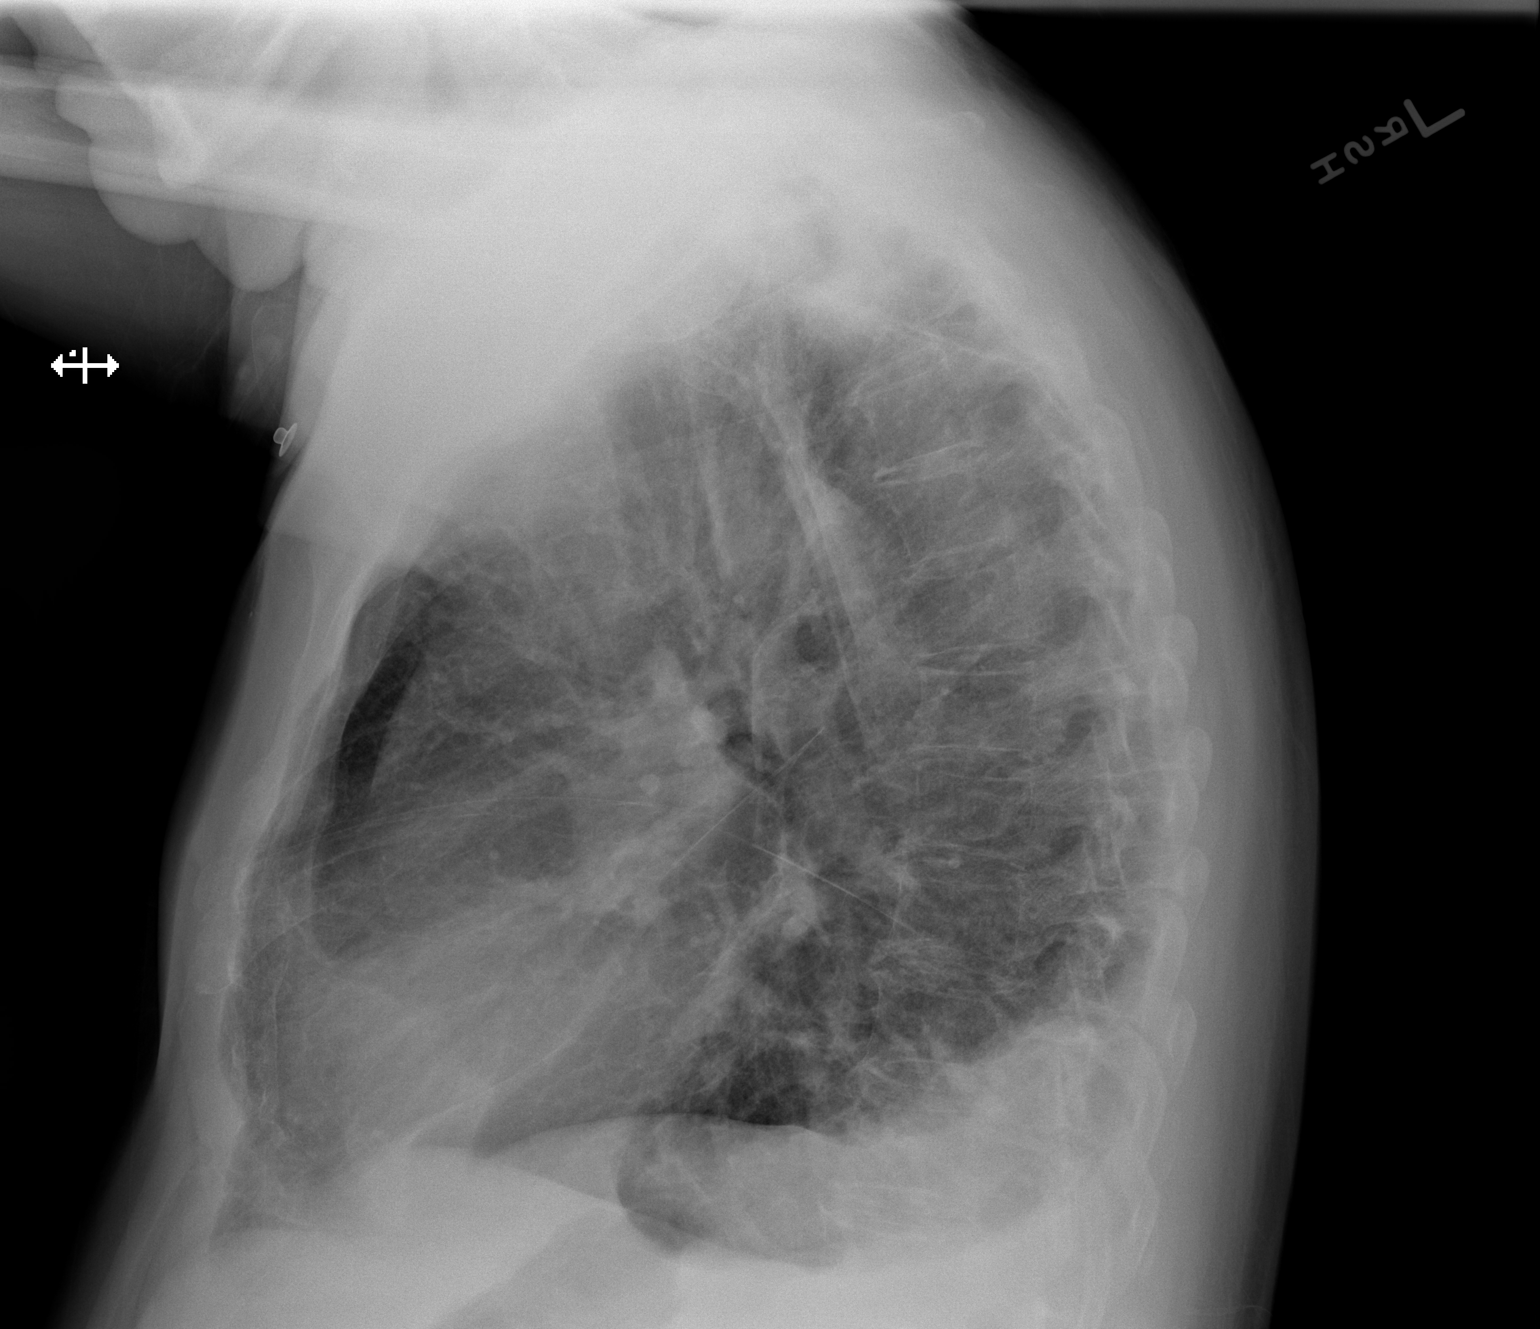

[2 of 2 positions shown; findings below may reference images not displayed]

FINDINGS: Cardiomediastinal silhouette is unremarkable. There is small left
pleural effusion with left basilar atelectasis or infiltrate. No
pulmonary edema. Osteopenia and mild degenerative changes lower
thoracic spine.
IMPRESSION: Small left pleural effusion with left basilar atelectasis or
infiltrate. No pulmonary edema.

## 2018-05-26 DIAGNOSIS — E1129 Type 2 diabetes mellitus with other diabetic kidney complication: Secondary | ICD-10-CM | POA: Diagnosis not present

## 2018-05-26 DIAGNOSIS — Z992 Dependence on renal dialysis: Secondary | ICD-10-CM | POA: Diagnosis not present

## 2018-05-26 DIAGNOSIS — N186 End stage renal disease: Secondary | ICD-10-CM | POA: Diagnosis not present

## 2018-05-26 DIAGNOSIS — N2581 Secondary hyperparathyroidism of renal origin: Secondary | ICD-10-CM | POA: Diagnosis not present

## 2018-05-29 DIAGNOSIS — M170 Bilateral primary osteoarthritis of knee: Secondary | ICD-10-CM | POA: Diagnosis not present

## 2018-05-29 DIAGNOSIS — N2581 Secondary hyperparathyroidism of renal origin: Secondary | ICD-10-CM | POA: Diagnosis not present

## 2018-05-29 DIAGNOSIS — Z6830 Body mass index (BMI) 30.0-30.9, adult: Secondary | ICD-10-CM | POA: Diagnosis not present

## 2018-05-29 DIAGNOSIS — N186 End stage renal disease: Secondary | ICD-10-CM | POA: Diagnosis not present

## 2018-05-30 DIAGNOSIS — M1712 Unilateral primary osteoarthritis, left knee: Secondary | ICD-10-CM | POA: Diagnosis not present

## 2018-06-01 DIAGNOSIS — N186 End stage renal disease: Secondary | ICD-10-CM | POA: Diagnosis not present

## 2018-06-01 DIAGNOSIS — N2581 Secondary hyperparathyroidism of renal origin: Secondary | ICD-10-CM | POA: Diagnosis not present

## 2018-06-02 DIAGNOSIS — N2581 Secondary hyperparathyroidism of renal origin: Secondary | ICD-10-CM | POA: Diagnosis not present

## 2018-06-02 DIAGNOSIS — N186 End stage renal disease: Secondary | ICD-10-CM | POA: Diagnosis not present

## 2018-06-06 DIAGNOSIS — N186 End stage renal disease: Secondary | ICD-10-CM | POA: Diagnosis not present

## 2018-06-06 DIAGNOSIS — N2581 Secondary hyperparathyroidism of renal origin: Secondary | ICD-10-CM | POA: Diagnosis not present

## 2018-06-07 DIAGNOSIS — N2581 Secondary hyperparathyroidism of renal origin: Secondary | ICD-10-CM | POA: Diagnosis not present

## 2018-06-07 DIAGNOSIS — N186 End stage renal disease: Secondary | ICD-10-CM | POA: Diagnosis not present

## 2018-06-09 DIAGNOSIS — N2581 Secondary hyperparathyroidism of renal origin: Secondary | ICD-10-CM | POA: Diagnosis not present

## 2018-06-09 DIAGNOSIS — N186 End stage renal disease: Secondary | ICD-10-CM | POA: Diagnosis not present

## 2018-06-13 DIAGNOSIS — N2581 Secondary hyperparathyroidism of renal origin: Secondary | ICD-10-CM | POA: Diagnosis not present

## 2018-06-13 DIAGNOSIS — N186 End stage renal disease: Secondary | ICD-10-CM | POA: Diagnosis not present

## 2018-06-14 DIAGNOSIS — N186 End stage renal disease: Secondary | ICD-10-CM | POA: Diagnosis not present

## 2018-06-14 DIAGNOSIS — N2581 Secondary hyperparathyroidism of renal origin: Secondary | ICD-10-CM | POA: Diagnosis not present

## 2018-06-16 DIAGNOSIS — N2581 Secondary hyperparathyroidism of renal origin: Secondary | ICD-10-CM | POA: Diagnosis not present

## 2018-06-16 DIAGNOSIS — N186 End stage renal disease: Secondary | ICD-10-CM | POA: Diagnosis not present

## 2018-06-18 DIAGNOSIS — H6192 Disorder of left external ear, unspecified: Secondary | ICD-10-CM | POA: Diagnosis not present

## 2018-06-18 DIAGNOSIS — M25551 Pain in right hip: Secondary | ICD-10-CM | POA: Diagnosis not present

## 2018-06-18 DIAGNOSIS — M25561 Pain in right knee: Secondary | ICD-10-CM | POA: Diagnosis not present

## 2018-06-18 DIAGNOSIS — M1711 Unilateral primary osteoarthritis, right knee: Secondary | ICD-10-CM | POA: Diagnosis not present

## 2018-06-19 DIAGNOSIS — N2581 Secondary hyperparathyroidism of renal origin: Secondary | ICD-10-CM | POA: Diagnosis not present

## 2018-06-19 DIAGNOSIS — N186 End stage renal disease: Secondary | ICD-10-CM | POA: Diagnosis not present

## 2018-06-21 DIAGNOSIS — N2581 Secondary hyperparathyroidism of renal origin: Secondary | ICD-10-CM | POA: Diagnosis not present

## 2018-06-21 DIAGNOSIS — N186 End stage renal disease: Secondary | ICD-10-CM | POA: Diagnosis not present

## 2018-06-23 DIAGNOSIS — I120 Hypertensive chronic kidney disease with stage 5 chronic kidney disease or end stage renal disease: Secondary | ICD-10-CM | POA: Diagnosis not present

## 2018-06-23 DIAGNOSIS — R9431 Abnormal electrocardiogram [ECG] [EKG]: Secondary | ICD-10-CM | POA: Diagnosis not present

## 2018-06-23 DIAGNOSIS — J189 Pneumonia, unspecified organism: Secondary | ICD-10-CM | POA: Diagnosis not present

## 2018-06-23 DIAGNOSIS — R4182 Altered mental status, unspecified: Secondary | ICD-10-CM | POA: Diagnosis not present

## 2018-06-23 DIAGNOSIS — N186 End stage renal disease: Secondary | ICD-10-CM | POA: Diagnosis not present

## 2018-06-23 DIAGNOSIS — Z4682 Encounter for fitting and adjustment of non-vascular catheter: Secondary | ICD-10-CM | POA: Diagnosis not present

## 2018-06-23 DIAGNOSIS — E1011 Type 1 diabetes mellitus with ketoacidosis with coma: Secondary | ICD-10-CM | POA: Diagnosis not present

## 2018-06-23 DIAGNOSIS — J181 Lobar pneumonia, unspecified organism: Secondary | ICD-10-CM | POA: Diagnosis not present

## 2018-06-23 DIAGNOSIS — Z992 Dependence on renal dialysis: Secondary | ICD-10-CM | POA: Diagnosis not present

## 2018-06-23 DIAGNOSIS — R918 Other nonspecific abnormal finding of lung field: Secondary | ICD-10-CM | POA: Diagnosis not present

## 2018-06-23 DIAGNOSIS — E1110 Type 2 diabetes mellitus with ketoacidosis without coma: Secondary | ICD-10-CM | POA: Diagnosis not present

## 2018-06-23 DIAGNOSIS — E1122 Type 2 diabetes mellitus with diabetic chronic kidney disease: Secondary | ICD-10-CM | POA: Diagnosis not present

## 2018-06-23 DIAGNOSIS — R509 Fever, unspecified: Secondary | ICD-10-CM | POA: Diagnosis not present

## 2018-06-23 DIAGNOSIS — J9621 Acute and chronic respiratory failure with hypoxia: Secondary | ICD-10-CM | POA: Diagnosis not present

## 2018-06-24 ENCOUNTER — Inpatient Hospital Stay (HOSPITAL_COMMUNITY): Payer: Medicare HMO

## 2018-06-24 ENCOUNTER — Inpatient Hospital Stay (HOSPITAL_COMMUNITY)
Admission: AD | Admit: 2018-06-24 | Discharge: 2018-07-09 | DRG: 870 | Disposition: A | Discharge status: Home Health | Payer: Medicare HMO | Source: Other Acute Inpatient Hospital | Attending: Internal Medicine | Admitting: Internal Medicine

## 2018-06-24 DIAGNOSIS — R1310 Dysphagia, unspecified: Secondary | ICD-10-CM

## 2018-06-24 DIAGNOSIS — Z9289 Personal history of other medical treatment: Secondary | ICD-10-CM

## 2018-06-24 DIAGNOSIS — N179 Acute kidney failure, unspecified: Secondary | ICD-10-CM | POA: Diagnosis not present

## 2018-06-24 DIAGNOSIS — I2510 Atherosclerotic heart disease of native coronary artery without angina pectoris: Secondary | ICD-10-CM | POA: Diagnosis present

## 2018-06-24 DIAGNOSIS — Z981 Arthrodesis status: Secondary | ICD-10-CM

## 2018-06-24 DIAGNOSIS — I4901 Ventricular fibrillation: Secondary | ICD-10-CM | POA: Diagnosis not present

## 2018-06-24 DIAGNOSIS — Z79890 Hormone replacement therapy (postmenopausal): Secondary | ICD-10-CM

## 2018-06-24 DIAGNOSIS — D638 Anemia in other chronic diseases classified elsewhere: Secondary | ICD-10-CM

## 2018-06-24 DIAGNOSIS — J189 Pneumonia, unspecified organism: Secondary | ICD-10-CM | POA: Diagnosis not present

## 2018-06-24 DIAGNOSIS — J9601 Acute respiratory failure with hypoxia: Secondary | ICD-10-CM | POA: Diagnosis not present

## 2018-06-24 DIAGNOSIS — K219 Gastro-esophageal reflux disease without esophagitis: Secondary | ICD-10-CM | POA: Diagnosis present

## 2018-06-24 DIAGNOSIS — J9600 Acute respiratory failure, unspecified whether with hypoxia or hypercapnia: Secondary | ICD-10-CM | POA: Diagnosis present

## 2018-06-24 DIAGNOSIS — J810 Acute pulmonary edema: Secondary | ICD-10-CM | POA: Diagnosis not present

## 2018-06-24 DIAGNOSIS — E039 Hypothyroidism, unspecified: Secondary | ICD-10-CM | POA: Diagnosis present

## 2018-06-24 DIAGNOSIS — E1142 Type 2 diabetes mellitus with diabetic polyneuropathy: Secondary | ICD-10-CM | POA: Diagnosis present

## 2018-06-24 DIAGNOSIS — Z823 Family history of stroke: Secondary | ICD-10-CM

## 2018-06-24 DIAGNOSIS — I071 Rheumatic tricuspid insufficiency: Secondary | ICD-10-CM | POA: Diagnosis present

## 2018-06-24 DIAGNOSIS — E108 Type 1 diabetes mellitus with unspecified complications: Secondary | ICD-10-CM

## 2018-06-24 DIAGNOSIS — R509 Fever, unspecified: Secondary | ICD-10-CM | POA: Diagnosis not present

## 2018-06-24 DIAGNOSIS — E1110 Type 2 diabetes mellitus with ketoacidosis without coma: Secondary | ICD-10-CM | POA: Diagnosis not present

## 2018-06-24 DIAGNOSIS — Z6827 Body mass index (BMI) 27.0-27.9, adult: Secondary | ICD-10-CM

## 2018-06-24 DIAGNOSIS — I214 Non-ST elevation (NSTEMI) myocardial infarction: Secondary | ICD-10-CM | POA: Diagnosis not present

## 2018-06-24 DIAGNOSIS — I517 Cardiomegaly: Secondary | ICD-10-CM | POA: Diagnosis not present

## 2018-06-24 DIAGNOSIS — I428 Other cardiomyopathies: Secondary | ICD-10-CM

## 2018-06-24 DIAGNOSIS — G9340 Encephalopathy, unspecified: Secondary | ICD-10-CM | POA: Diagnosis not present

## 2018-06-24 DIAGNOSIS — J9691 Respiratory failure, unspecified with hypoxia: Secondary | ICD-10-CM | POA: Diagnosis present

## 2018-06-24 DIAGNOSIS — N186 End stage renal disease: Secondary | ICD-10-CM | POA: Diagnosis not present

## 2018-06-24 DIAGNOSIS — D631 Anemia in chronic kidney disease: Secondary | ICD-10-CM | POA: Diagnosis not present

## 2018-06-24 DIAGNOSIS — R680 Hypothermia, not associated with low environmental temperature: Secondary | ICD-10-CM | POA: Diagnosis not present

## 2018-06-24 DIAGNOSIS — Z8679 Personal history of other diseases of the circulatory system: Secondary | ICD-10-CM | POA: Diagnosis not present

## 2018-06-24 DIAGNOSIS — E119 Type 2 diabetes mellitus without complications: Secondary | ICD-10-CM

## 2018-06-24 DIAGNOSIS — J9690 Respiratory failure, unspecified, unspecified whether with hypoxia or hypercapnia: Secondary | ICD-10-CM

## 2018-06-24 DIAGNOSIS — N2581 Secondary hyperparathyroidism of renal origin: Secondary | ICD-10-CM | POA: Diagnosis not present

## 2018-06-24 DIAGNOSIS — I462 Cardiac arrest due to underlying cardiac condition: Secondary | ICD-10-CM | POA: Diagnosis not present

## 2018-06-24 DIAGNOSIS — Z992 Dependence on renal dialysis: Secondary | ICD-10-CM

## 2018-06-24 DIAGNOSIS — Z9842 Cataract extraction status, left eye: Secondary | ICD-10-CM

## 2018-06-24 DIAGNOSIS — R6521 Severe sepsis with septic shock: Secondary | ICD-10-CM | POA: Diagnosis not present

## 2018-06-24 DIAGNOSIS — R9431 Abnormal electrocardiogram [ECG] [EKG]: Secondary | ICD-10-CM

## 2018-06-24 DIAGNOSIS — Z4659 Encounter for fitting and adjustment of other gastrointestinal appliance and device: Secondary | ICD-10-CM | POA: Diagnosis not present

## 2018-06-24 DIAGNOSIS — Z8673 Personal history of transient ischemic attack (TIA), and cerebral infarction without residual deficits: Secondary | ICD-10-CM

## 2018-06-24 DIAGNOSIS — I469 Cardiac arrest, cause unspecified: Secondary | ICD-10-CM | POA: Diagnosis not present

## 2018-06-24 DIAGNOSIS — I5043 Acute on chronic combined systolic (congestive) and diastolic (congestive) heart failure: Secondary | ICD-10-CM | POA: Diagnosis not present

## 2018-06-24 DIAGNOSIS — I4891 Unspecified atrial fibrillation: Secondary | ICD-10-CM | POA: Diagnosis not present

## 2018-06-24 DIAGNOSIS — Z961 Presence of intraocular lens: Secondary | ICD-10-CM | POA: Diagnosis present

## 2018-06-24 DIAGNOSIS — I493 Ventricular premature depolarization: Secondary | ICD-10-CM | POA: Diagnosis present

## 2018-06-24 DIAGNOSIS — E8889 Other specified metabolic disorders: Secondary | ICD-10-CM | POA: Diagnosis present

## 2018-06-24 DIAGNOSIS — R1110 Vomiting, unspecified: Secondary | ICD-10-CM

## 2018-06-24 DIAGNOSIS — I472 Ventricular tachycardia: Secondary | ICD-10-CM | POA: Diagnosis not present

## 2018-06-24 DIAGNOSIS — R634 Abnormal weight loss: Secondary | ICD-10-CM | POA: Diagnosis present

## 2018-06-24 DIAGNOSIS — Z452 Encounter for adjustment and management of vascular access device: Secondary | ICD-10-CM | POA: Diagnosis not present

## 2018-06-24 DIAGNOSIS — R4182 Altered mental status, unspecified: Secondary | ICD-10-CM | POA: Diagnosis not present

## 2018-06-24 DIAGNOSIS — Z794 Long term (current) use of insulin: Secondary | ICD-10-CM

## 2018-06-24 DIAGNOSIS — Z03818 Encounter for observation for suspected exposure to other biological agents ruled out: Secondary | ICD-10-CM | POA: Diagnosis not present

## 2018-06-24 DIAGNOSIS — E875 Hyperkalemia: Secondary | ICD-10-CM | POA: Diagnosis not present

## 2018-06-24 DIAGNOSIS — I10 Essential (primary) hypertension: Secondary | ICD-10-CM | POA: Diagnosis present

## 2018-06-24 DIAGNOSIS — E1129 Type 2 diabetes mellitus with other diabetic kidney complication: Secondary | ICD-10-CM | POA: Diagnosis not present

## 2018-06-24 DIAGNOSIS — I5031 Acute diastolic (congestive) heart failure: Secondary | ICD-10-CM | POA: Diagnosis not present

## 2018-06-24 DIAGNOSIS — A419 Sepsis, unspecified organism: Secondary | ICD-10-CM | POA: Diagnosis not present

## 2018-06-24 DIAGNOSIS — J9621 Acute and chronic respiratory failure with hypoxia: Secondary | ICD-10-CM | POA: Diagnosis not present

## 2018-06-24 DIAGNOSIS — M1990 Unspecified osteoarthritis, unspecified site: Secondary | ICD-10-CM | POA: Diagnosis present

## 2018-06-24 DIAGNOSIS — E1122 Type 2 diabetes mellitus with diabetic chronic kidney disease: Secondary | ICD-10-CM | POA: Diagnosis not present

## 2018-06-24 DIAGNOSIS — R410 Disorientation, unspecified: Secondary | ICD-10-CM | POA: Diagnosis not present

## 2018-06-24 DIAGNOSIS — Z79899 Other long term (current) drug therapy: Secondary | ICD-10-CM

## 2018-06-24 DIAGNOSIS — I132 Hypertensive heart and chronic kidney disease with heart failure and with stage 5 chronic kidney disease, or end stage renal disease: Secondary | ICD-10-CM | POA: Diagnosis not present

## 2018-06-24 DIAGNOSIS — I120 Hypertensive chronic kidney disease with stage 5 chronic kidney disease or end stage renal disease: Secondary | ICD-10-CM | POA: Diagnosis not present

## 2018-06-24 DIAGNOSIS — Z4682 Encounter for fitting and adjustment of non-vascular catheter: Secondary | ICD-10-CM | POA: Diagnosis not present

## 2018-06-24 DIAGNOSIS — I4729 Other ventricular tachycardia: Secondary | ICD-10-CM

## 2018-06-24 DIAGNOSIS — Z87891 Personal history of nicotine dependence: Secondary | ICD-10-CM

## 2018-06-24 DIAGNOSIS — G2581 Restless legs syndrome: Secondary | ICD-10-CM | POA: Diagnosis present

## 2018-06-24 DIAGNOSIS — R1312 Dysphagia, oropharyngeal phase: Secondary | ICD-10-CM | POA: Diagnosis not present

## 2018-06-24 DIAGNOSIS — Z806 Family history of leukemia: Secondary | ICD-10-CM

## 2018-06-24 DIAGNOSIS — R627 Adult failure to thrive: Secondary | ICD-10-CM | POA: Diagnosis present

## 2018-06-24 DIAGNOSIS — I4581 Long QT syndrome: Secondary | ICD-10-CM | POA: Diagnosis present

## 2018-06-24 HISTORY — DX: Sepsis, unspecified organism: A41.9

## 2018-06-24 LAB — COMPREHENSIVE METABOLIC PANEL
ALT: 11 U/L (ref 0–44)
AST: 18 U/L (ref 15–41)
Albumin: 2.5 g/dL — ABNORMAL LOW (ref 3.5–5.0)
Alkaline Phosphatase: 83 U/L (ref 38–126)
Anion gap: 15 (ref 5–15)
BUN: 53 mg/dL — ABNORMAL HIGH (ref 8–23)
CO2: 21 mmol/L — ABNORMAL LOW (ref 22–32)
Calcium: 7.4 mg/dL — ABNORMAL LOW (ref 8.9–10.3)
Chloride: 99 mmol/L (ref 98–111)
Creatinine, Ser: 5.94 mg/dL — ABNORMAL HIGH (ref 0.61–1.24)
GFR calc Af Amer: 10 mL/min — ABNORMAL LOW (ref >60)
GFR calc non Af Amer: 9 mL/min — ABNORMAL LOW (ref >60)
Glucose, Bld: 213 mg/dL — ABNORMAL HIGH (ref 70–99)
Potassium: 3.9 mmol/L (ref 3.5–5.1)
Sodium: 135 mmol/L (ref 135–145)
Total Bilirubin: 0.7 mg/dL (ref 0.3–1.2)
Total Protein: 4.9 g/dL — ABNORMAL LOW (ref 6.5–8.1)

## 2018-06-24 LAB — CBC WITH DIFFERENTIAL/PLATELET
Abs Immature Granulocytes: 0.3 10*3/uL — ABNORMAL HIGH (ref 0.00–0.07)
Basophils Absolute: 0.1 10*3/uL (ref 0.0–0.1)
Basophils Relative: 0 %
Eosinophils Absolute: 0.1 10*3/uL (ref 0.0–0.5)
Eosinophils Relative: 0 %
HCT: 34.7 % — ABNORMAL LOW (ref 39.0–52.0)
Hemoglobin: 11.3 g/dL — ABNORMAL LOW (ref 13.0–17.0)
Immature Granulocytes: 1 %
Lymphocytes Relative: 10 %
Lymphs Abs: 2.3 10*3/uL (ref 0.7–4.0)
MCH: 29.5 pg (ref 26.0–34.0)
MCHC: 32.6 g/dL (ref 30.0–36.0)
MCV: 90.6 fL (ref 80.0–100.0)
Monocytes Absolute: 1.4 10*3/uL — ABNORMAL HIGH (ref 0.1–1.0)
Monocytes Relative: 6 %
Neutro Abs: 19.3 10*3/uL — ABNORMAL HIGH (ref 1.7–7.7)
Neutrophils Relative %: 83 %
Platelets: 281 10*3/uL (ref 150–400)
RBC: 3.83 MIL/uL — ABNORMAL LOW (ref 4.22–5.81)
RDW: 14.5 % (ref 11.5–15.5)
WBC: 23.4 10*3/uL — ABNORMAL HIGH (ref 4.0–10.5)
nRBC: 0.1 % (ref 0.0–0.2)

## 2018-06-24 LAB — HIV ANTIBODY (ROUTINE TESTING W REFLEX): HIV Screen 4th Generation wRfx: NONREACTIVE

## 2018-06-24 LAB — GLUCOSE, CAPILLARY
GLUCOSE-CAPILLARY: 261 mg/dL — AB (ref 70–99)
Glucose-Capillary: 333 mg/dL — ABNORMAL HIGH (ref 70–99)
Glucose-Capillary: 173 mg/dL — ABNORMAL HIGH (ref 70–99)
Glucose-Capillary: 205 mg/dL — ABNORMAL HIGH (ref 70–99)
Glucose-Capillary: 160 mg/dL — ABNORMAL HIGH (ref 70–99)
Glucose-Capillary: 129 mg/dL — ABNORMAL HIGH (ref 70–99)
Glucose-Capillary: 118 mg/dL — ABNORMAL HIGH (ref 70–99)
Glucose-Capillary: 147 mg/dL — ABNORMAL HIGH (ref 70–99)
Glucose-Capillary: 218 mg/dL — ABNORMAL HIGH (ref 70–99)
Glucose-Capillary: 189 mg/dL — ABNORMAL HIGH (ref 70–99)

## 2018-06-24 LAB — MRSA PCR SCREENING: MRSA by PCR: NEGATIVE

## 2018-06-24 LAB — TRIGLYCERIDES: Triglycerides: 153 mg/dL — ABNORMAL HIGH (ref <150)

## 2018-06-24 LAB — LACTIC ACID, PLASMA
Lactic Acid, Venous: 1.9 mmol/L (ref 0.5–1.9)
Lactic Acid, Venous: 1.6 mmol/L (ref 0.5–1.9)

## 2018-06-24 MED ORDER — MIDAZOLAM HCL 2 MG/2ML IJ SOLN
1.0000 mg | INTRAMUSCULAR | Status: DC | PRN
  Administered 2018-06-25 – 2018-06-27 (×4): 1 mg via INTRAVENOUS
  Filled 2018-06-24 (×2): qty 2

## 2018-06-24 MED ORDER — SODIUM CHLORIDE 0.9% FLUSH: 10.0000 mL | INTRAVENOUS | Status: DC | PRN

## 2018-06-24 MED ORDER — ORAL CARE MOUTH RINSE
15.0000 mL | OROMUCOSAL | Status: DC
  Administered 2018-06-24 – 2018-06-30 (×60): 15 mL via OROMUCOSAL

## 2018-06-24 MED ORDER — FENTANYL CITRATE (PF) 100 MCG/2ML IJ SOLN: 50.0000 ug | INTRAMUSCULAR | Status: DC | PRN

## 2018-06-24 MED ORDER — PRISMASOL BGK 4/2.5 32-4-2.5 MEQ/L REPLACEMENT SOLN
Status: DC
  Administered 2018-06-24 – 2018-06-26 (×6): via INTRAVENOUS_CENTRAL
  Filled 2018-06-24 (×6): qty 5000

## 2018-06-24 MED ORDER — NOREPINEPHRINE 4 MG/250ML-% IV SOLN
INTRAVENOUS | Status: AC
  Administered 2018-06-24: 4 mg via INTRAVENOUS
  Filled 2018-06-24: qty 250

## 2018-06-24 MED ORDER — VANCOMYCIN HCL IN DEXTROSE 750-5 MG/150ML-% IV SOLN
750.0000 mg | INTRAVENOUS | Status: DC
  Administered 2018-06-24 – 2018-06-25 (×2): 750 mg via INTRAVENOUS
  Filled 2018-06-24 (×2): qty 150

## 2018-06-24 MED ORDER — DOCUSATE SODIUM 50 MG/5ML PO LIQD: 100.0000 mg | Freq: Two times a day (BID) | ORAL | Status: DC | PRN

## 2018-06-24 MED ORDER — PANTOPRAZOLE SODIUM 40 MG IV SOLR
40.0000 mg | Freq: Every day | INTRAVENOUS | Status: DC
  Administered 2018-06-24 – 2018-06-25 (×2): 40 mg via INTRAVENOUS
  Filled 2018-06-24 (×2): qty 40

## 2018-06-24 MED ORDER — FENTANYL CITRATE (PF) 100 MCG/2ML IJ SOLN
50.0000 ug | INTRAMUSCULAR | Status: DC | PRN
  Administered 2018-06-24: 50 ug via INTRAVENOUS
  Filled 2018-06-24: qty 2

## 2018-06-24 MED ORDER — NOREPINEPHRINE 4 MG/250ML-% IV SOLN
0.0000 ug/min | INTRAVENOUS | Status: DC
  Administered 2018-06-24: 5 ug/min via INTRAVENOUS
  Administered 2018-06-24: 4 mg via INTRAVENOUS
  Administered 2018-06-25: 2 ug/min via INTRAVENOUS
  Administered 2018-06-26: 5 ug/min via INTRAVENOUS
  Administered 2018-06-27: 03:00:00 8 ug/min via INTRAVENOUS
  Filled 2018-06-24 (×4): qty 250

## 2018-06-24 MED ORDER — CHLORHEXIDINE GLUCONATE 0.12% ORAL RINSE (MEDLINE KIT)
15.0000 mL | Freq: Two times a day (BID) | OROMUCOSAL | Status: DC
  Administered 2018-06-24 – 2018-06-30 (×14): 15 mL via OROMUCOSAL

## 2018-06-24 MED ORDER — ENOXAPARIN SODIUM 30 MG/0.3ML ~~LOC~~ SOLN: 30.0000 mg | SUBCUTANEOUS | Status: DC

## 2018-06-24 MED ORDER — HEPARIN SODIUM (PORCINE) 5000 UNIT/ML IJ SOLN
5000.0000 [IU] | Freq: Three times a day (TID) | INTRAMUSCULAR | Status: DC
  Administered 2018-06-24 – 2018-06-26 (×9): 5000 [IU] via SUBCUTANEOUS
  Filled 2018-06-24 (×9): qty 1

## 2018-06-24 MED ORDER — PRISMASOL BGK 4/2.5 32-4-2.5 MEQ/L IV SOLN
INTRAVENOUS | Status: DC
  Administered 2018-06-24 – 2018-06-26 (×13): via INTRAVENOUS_CENTRAL
  Filled 2018-06-24 (×18): qty 5000

## 2018-06-24 MED ORDER — HEPARIN SODIUM (PORCINE) 1000 UNIT/ML DIALYSIS
1000.0000 [IU] | INTRAMUSCULAR | Status: DC | PRN
  Administered 2018-06-24: 2400 [IU] via INTRAVENOUS_CENTRAL
  Filled 2018-06-24: qty 3
  Filled 2018-06-24 (×2): qty 6

## 2018-06-24 MED ORDER — LEVOTHYROXINE SODIUM 50 MCG PO TABS
50.0000 ug | ORAL_TABLET | Freq: Every day | ORAL | Status: DC
  Filled 2018-06-24 (×3): qty 1

## 2018-06-24 MED ORDER — INSULIN ASPART 100 UNIT/ML ~~LOC~~ SOLN
0.0000 [IU] | SUBCUTANEOUS | Status: DC
  Administered 2018-06-24: 4 [IU] via SUBCUTANEOUS
  Administered 2018-06-24: 8 [IU] via SUBCUTANEOUS
  Administered 2018-06-25 – 2018-06-26 (×8): 2 [IU] via SUBCUTANEOUS
  Administered 2018-06-26: 8 [IU] via SUBCUTANEOUS
  Administered 2018-06-26: 2 [IU] via SUBCUTANEOUS
  Administered 2018-06-26: 4 [IU] via SUBCUTANEOUS
  Administered 2018-06-27 – 2018-06-28 (×6): 2 [IU] via SUBCUTANEOUS
  Administered 2018-06-28: 4 [IU] via SUBCUTANEOUS
  Administered 2018-06-28 – 2018-06-29 (×4): 2 [IU] via SUBCUTANEOUS
  Administered 2018-06-29: 12 [IU] via SUBCUTANEOUS
  Administered 2018-06-29: 4 [IU] via SUBCUTANEOUS
  Administered 2018-06-29 – 2018-06-30 (×3): 2 [IU] via SUBCUTANEOUS
  Administered 2018-06-30: 12 [IU] via SUBCUTANEOUS
  Administered 2018-06-30: 4 [IU] via SUBCUTANEOUS
  Administered 2018-06-30 – 2018-07-02 (×7): 2 [IU] via SUBCUTANEOUS

## 2018-06-24 MED ORDER — MIDAZOLAM HCL 2 MG/2ML IJ SOLN
1.0000 mg | INTRAMUSCULAR | Status: DC | PRN
  Administered 2018-06-26 – 2018-06-27 (×2): 1 mg via INTRAVENOUS
  Filled 2018-06-24 (×4): qty 2

## 2018-06-24 MED ORDER — DEXTROSE-NACL 5-0.45 % IV SOLN
INTRAVENOUS | Status: DC
  Administered 2018-06-24: 07:00:00 via INTRAVENOUS

## 2018-06-24 MED ORDER — PRISMASOL BGK 4/2.5 32-4-2.5 MEQ/L REPLACEMENT SOLN
Status: DC
  Administered 2018-06-24 – 2018-06-26 (×4): via INTRAVENOUS_CENTRAL
  Filled 2018-06-24 (×4): qty 5000

## 2018-06-24 MED ORDER — INSULIN REGULAR(HUMAN) IN NACL 100-0.9 UT/100ML-% IV SOLN
INTRAVENOUS | Status: DC
  Administered 2018-06-24: 4 [IU]/h via INTRAVENOUS
  Filled 2018-06-24 (×3): qty 100

## 2018-06-24 MED ORDER — PROPOFOL 1000 MG/100ML IV EMUL
0.0000 ug/kg/min | INTRAVENOUS | Status: DC
  Administered 2018-06-24: 15 ug/kg/min via INTRAVENOUS
  Administered 2018-06-24: 30 ug/kg/min via INTRAVENOUS
  Administered 2018-06-24: 15 ug/kg/min via INTRAVENOUS
  Administered 2018-06-25: 50 ug/kg/min via INTRAVENOUS
  Administered 2018-06-25: 25 ug/kg/min via INTRAVENOUS
  Filled 2018-06-24 (×5): qty 100

## 2018-06-24 MED ORDER — SODIUM CHLORIDE 0.9% FLUSH
10.0000 mL | Freq: Two times a day (BID) | INTRAVENOUS | Status: DC
  Administered 2018-06-24 – 2018-06-30 (×12): 10 mL

## 2018-06-24 MED ORDER — VANCOMYCIN HCL IN DEXTROSE 750-5 MG/150ML-% IV SOLN: 750.0000 mg | INTRAVENOUS | Status: DC

## 2018-06-24 MED ORDER — ACETAMINOPHEN 650 MG RE SUPP
650.0000 mg | RECTAL | Status: DC | PRN
  Administered 2018-06-24: 650 mg via RECTAL
  Filled 2018-06-24 (×2): qty 1

## 2018-06-24 MED ORDER — SODIUM CHLORIDE 0.9 % IV SOLN
1.0000 g | INTRAVENOUS | Status: AC
  Administered 2018-06-24 – 2018-06-30 (×7): 1 g via INTRAVENOUS
  Filled 2018-06-24 (×7): qty 10

## 2018-06-24 MED ORDER — HEPARIN (PORCINE) 2000 UNITS/L FOR CRRT
INTRAVENOUS_CENTRAL | Status: DC | PRN
  Filled 2018-06-24 (×2): qty 1000

## 2018-06-24 MED ORDER — CHLORHEXIDINE GLUCONATE CLOTH 2 % EX PADS
6.0000 | MEDICATED_PAD | Freq: Every day | CUTANEOUS | Status: DC
  Administered 2018-06-24 – 2018-06-30 (×7): 6 via TOPICAL

## 2018-06-24 MED ORDER — SODIUM CHLORIDE 0.9 % IV SOLN
500.0000 mg | INTRAVENOUS | Status: DC
  Administered 2018-06-24 – 2018-06-27 (×4): 500 mg via INTRAVENOUS
  Filled 2018-06-24 (×5): qty 500

## 2018-06-24 NOTE — Progress Notes (Signed)
Pt has been off levo since 1115. Pt has had 2 bms's and becomes extremely agitated with each, wiggling  In bed, trying to pull out any lines. bp in 200's,hr in 130's,on 15 mcg propofol. Pt is breathing over the ventilator ( resp 32),sats low 90's. Pt opens eyes to commands,but doesn't follow any more commands. Held propofol for 20 minutes, sat with patient. Pt still with hr 130, bp in 200's.restarted propofol at 15 mcg. Continue to monitor.

## 2018-06-24 NOTE — Procedures (Signed)
Arterial Catheter Insertion Procedure Note Alexander Whitaker [id number] 09-04-1950  Procedure: Insertion of Arterial Catheter  Indications: Blood pressure monitoring  Procedure Details Consent: Risks of procedure as well as the alternatives and risks of each were explained to the (patient/caregiver).  Consent for procedure obtained. Time Out: Verified patient identification, verified procedure, site/side was marked, verified correct patient position, special equipment/implants available, medications/allergies/relevent history reviewed, required imaging and test results available.  Performed  Maximum sterile technique was used including antiseptics, cap, gloves, gown, hand hygiene, mask and sheet. Skin prep: Chlorhexidine; local anesthetic administered 20 gauge catheter was inserted into right femoral artery using the Seldinger technique. ULTRASOUND GUIDANCE USED: YES Evaluation Blood flow good; BP tracing good. Complications: No apparent complications.  Central Venous Catheter Insertion Procedure Note Alexander Whitaker [id number] 04-Jun-1950  Procedure: Insertion of Central Venous Catheter Indications: Assessment of intravascular volume, Drug and/or fluid administration and Frequent blood sampling  Procedure Details Consent: Risks of procedure as well as the alternatives and risks of each were explained to the (patient/caregiver).  Consent for procedure obtained. Time Out: Verified patient identification, verified procedure, site/side was marked, verified correct patient position, special equipment/implants available, medications/allergies/relevent history reviewed, required imaging and test results available.  Performed  Maximum sterile technique was used including antiseptics, cap, gloves, gown, hand hygiene, mask and sheet. Skin prep: Chlorhexidine; local anesthetic administered A antimicrobial bonded/coated triple lumen catheter was placed in the right internal jugular vein using the  Seldinger technique.  Evaluation Blood flow good Complications: No apparent complications Patient did tolerate procedure well. Chest X-ray ordered to verify placement.  CXR: pending.  Alexander Whitaker [place] 06/24/2018, 7:08 AM     Alexander Whitaker 06/24/2018

## 2018-06-24 NOTE — Consult Note (Signed)
[facility] Renal Consultation Note    Indication for Consultation:  Management of ESRD/hemodialysis; anemia, hypertension/volume and secondary hyperparathyroidism  HPI: Alexander Whitaker is a 68 y.o. male.  Alexander Whitaker has a PMH significant for HTN, DM type 2, CHF, and ESRD who presented to [facility] with AMS and fever of 101.6.  CXR showed diffuse infiltrates and glucose of 638.  He was intubated and started on vancomycin, ceftriaxone, and azithromycin at [facility] and transferred to [facility] ICU early this morning.  We were consulted to provide dialysis services while he remains an inpatient.    He missed HD yesterday but had been compliant prior to that.  Past Medical History:  Diagnosis Date  . Anemia   . Arthritis    "probably in his joints" (11/21/2017)  . CHF (congestive heart failure) ([place]) ~ 2017  . CKD stage 3 due to type 2 diabetes mellitus ([place])    stage 5 Dialysis T/Th/Sa  . CVA (cerebral vascular accident) [facility]) 2007   wife denies residual on 11/21/2017  . Diabetic peripheral neuropathy ([place])   . Dyspnea   . ESRD (end stage renal disease) on dialysis ([place])    "[place]; TTS" (11/21/2017)  . GERD (gastroesophageal reflux disease)    has in the past  . Headache    since going on dialysis  . Hypertension   . Hypothyroidism   . PONV (postoperative nausea and vomiting)   . Restless legs   . Type II diabetes mellitus ([place])    Past Surgical History:  Procedure Laterality Date  . AV FISTULA PLACEMENT Right 10/07/2016   Procedure: RIGHT BRACHIOCEPHALIC ARTERIOVENOUS (AV) FISTULA CREATION;  Surgeon: Angelia Mould, MD;  Location: [place];  Service: Vascular;  Laterality: Right;  . BACK SURGERY    . CATARACT EXTRACTION W/ INTRAOCULAR LENS IMPLANT Left   . CIRCUMCISION  2015  . HERNIA REPAIR  2016   "stomach"  . POSTERIOR LUMBAR FUSION  1990s?   "has bolts and rods in there"   Family History:   Family History  Problem Relation Age of Onset   . Leukemia Mother 61  . CVA Father 84   Social History:  reports that he quit smoking about 14 months ago. His smoking use included cigarettes. He has a 57.00 pack-year smoking history. He has never used smokeless tobacco. He reports previous alcohol use. He reports that he does not use drugs. No Known Allergies Prior to Admission medications   Medication Sig Start Date End Date Taking? Authorizing Provider  albuterol (PROVENTIL HFA;VENTOLIN HFA) 108 (90 Base) MCG/ACT inhaler Inhale 2 puffs into the lungs every 6 (six) hours as needed for wheezing or shortness of breath.    [provider]  amLODipine (NORVASC) 10 MG tablet Take 1 tablet (10 mg total) by mouth daily. 11/25/17   Doreatha Lew, MD  calcium acetate (PHOSLO) 667 MG capsule Take 1,334 mg by mouth 3 (three) times daily with meals.    [provider]  camphor-menthol Timoteo Ace) lotion Apply 1 application topically as needed for itching.    [provider]  clonazePAM (KLONOPIN) 2 MG tablet Take 2 mg by mouth 3 (three) times daily as needed for anxiety.    [provider]  famotidine (PEPCID) 40 MG tablet Take 40 mg by mouth daily.    [provider]  gabapentin (NEURONTIN) 300 MG capsule Take 300 mg by mouth 2 (two) times daily.    [provider]  hydrALAZINE (APRESOLINE) 25 MG  tablet Take 25 mg by mouth 3 (three) times daily.    [provider]  hydrOXYzine (ATARAX/VISTARIL) 25 MG tablet Take 25 mg by mouth 2 (two) times daily as needed for itching.     [provider]  insulin degludec (TRESIBA FLEXTOUCH) 100 UNIT/ML SOPN FlexTouch Pen Inject 50 Units into the skin daily.    [provider]  Insulin Detemir (LEVEMIR) 100 UNIT/ML Pen Inject 10 Units into the skin daily at 12 noon. 10/07/16   Alvia Grove, PA-C  levothyroxine (SYNTHROID, LEVOTHROID) 50 MCG tablet Take 50 mcg by mouth daily before breakfast.  09/19/16   [provider]   lidocaine-prilocaine (EMLA) cream Apply 1 application topically See admin instructions. Apply small amount to access site 1 to 2 hours before dialysis. 11/13/17   [provider]  lisinopril (PRINIVIL,ZESTRIL) 5 MG tablet Take 5 mg by mouth every evening. 01/18/18   [provider]  loratadine (CLARITIN) 10 MG tablet Take 10 mg by mouth daily as needed for rhinitis.     [provider]  Melatonin 10 MG CAPS Take 10 mg by mouth at bedtime as needed (sleep).     [provider]  pantoprazole (PROTONIX) 40 MG tablet Take 40 mg by mouth daily.    [provider]  pramipexole (MIRAPEX) 0.5 MG tablet Take 0.5 mg by mouth at bedtime as needed (sleep).    [provider]   Current Facility-Administered Medications  Medication Dose Route Frequency Provider Last Rate Last Dose  . azithromycin (ZITHROMAX) 500 mg in sodium chloride 0.9 % 250 mL IVPB  500 mg Intravenous Q24H Garrel Ridgel, MD 250 mL/hr at 06/24/18 0808 500 mg at 06/24/18 0808  . cefTRIAXone (ROCEPHIN) 1 g in sodium chloride 0.9 % 100 mL IVPB  1 g Intravenous Q24H Eltaraboulsi, Walid R, MD      . chlorhexidine gluconate (MEDLINE KIT) (PERIDEX) 0.12 % solution 15 mL  15 mL Mouth Rinse BID Garrel Ridgel, MD   15 mL at 06/24/18 0810  . dextrose 5 %-0.45 % sodium chloride infusion   Intravenous Continuous Garrel Ridgel, MD 125 mL/hr at 06/24/18 1103    . docusate (COLACE) 50 MG/5ML liquid 100 mg  100 mg Per Tube BID PRN Garrel Ridgel, MD      . fentaNYL (SUBLIMAZE) injection 50 mcg  50 mcg Intravenous Q15 min PRN Garrel Ridgel, MD   50 mcg at 06/24/18 [phone]  . fentaNYL (SUBLIMAZE) injection 50 mcg  50 mcg Intravenous Q2H PRN Garrel Ridgel, MD      . heparin injection 5,000 Units  5,000 Units Subcutaneous Q8H Deterding, Guadelupe Sabin, MD   5,000 Units at 06/24/18 [phone]  . insulin regular, human (MYXREDLIN) 100 units/ 100 mL infusion   Intravenous  Continuous Jeanene Erb R, MD 4.4 mL/hr at 06/24/18 0806 4.4 Units/hr at 06/24/18 0806  . levothyroxine (SYNTHROID, LEVOTHROID) tablet 50 mcg  50 mcg Oral Q0600 Garrel Ridgel, MD      . MEDLINE mouth rinse  15 mL Mouth Rinse 10 times per day Garrel Ridgel, MD   15 mL at 06/24/18 0953  . midazolam (VERSED) injection 1 mg  1 mg Intravenous Q15 min PRN Eltaraboulsi, Glenetta Borg, MD      . midazolam (VERSED) injection 1 mg  1 mg Intravenous Q2H PRN Eltaraboulsi, Walid R, MD      . norepinephrine (LEVOPHED) 42m in 2524mpremix infusion  0-40 mcg/min Intravenous Titrated ElJeanene Erb  R, MD 7.5 mL/hr at 06/24/18 1103 2 mcg/min at 06/24/18 1103  . pantoprazole (PROTONIX) injection 40 mg  40 mg Intravenous QHS Eltaraboulsi, Walid R, MD      . propofol (DIPRIVAN) 1000 MG/100ML infusion  0-50 mcg/kg/min Intravenous Continuous Eltaraboulsi, Walid R, MD 6.87 mL/hr at 06/24/18 0600 15 mcg/kg/min at 06/24/18 0600  . [START ON 06/26/2018] vancomycin (VANCOCIN) IVPB 750 mg/150 ml premix  750 mg Intravenous Q T,Th,Sa-HD Erenest Blank, [facility]       Labs: Basic Metabolic Panel: Recent Labs  Lab 06/24/18 0639  NA 135  K 3.9  CL 99  CO2 21*  GLUCOSE 213*  BUN 53*  CREATININE 5.94*  CALCIUM 7.4*   Liver Function Tests: Recent Labs  Lab 06/24/18 0639  AST 18  ALT 11  ALKPHOS 83  BILITOT 0.7  PROT 4.9*  ALBUMIN 2.5*   No results for input(s): LIPASE, AMYLASE in the last 168 hours. No results for input(s): AMMONIA in the last 168 hours. CBC: Recent Labs  Lab 06/24/18 0639  WBC 23.4*  NEUTROABS 19.3*  HGB 11.3*  HCT 34.7*  MCV 90.6  PLT 281   Cardiac Enzymes: No results for input(s): CKTOTAL, CKMB, CKMBINDEX, TROPONINI in the last 168 hours. CBG: Recent Labs  Lab 06/24/18 0535 06/24/18 0654 06/24/18 0755 06/24/18 0924 06/24/18 1038  GLUCAP 261* 173* 205* 160* 129*   Iron Studies: No results for input(s): IRON, TIBC, TRANSFERRIN, FERRITIN in the last 72  hours. Studies/Results: Dg Abd 1 View  Result Date: 06/24/2018 CLINICAL DATA:  Orogastric tube placement. EXAM: ABDOMEN - 1 VIEW COMPARISON:  None. FINDINGS: The bowel gas pattern is normal. Distal tip of nasogastric tube appears to be in distal esophagus. No radio-opaque calculi or other significant radiographic abnormality are seen. IMPRESSION: Distal tip of nasogastric tube appears to be in distal esophagus; advancement is recommended. No evidence of bowel obstruction or ileus. Electronically Signed   By: Marijo Conception, M.D.   On: 06/24/2018 08:54   Dg Chest Port 1 View  Result Date: 06/24/2018 CLINICAL DATA:  Respiratory distress. EXAM: PORTABLE CHEST 1 VIEW COMPARISON:  Radiograph of June 23, 2018. FINDINGS: Stable cardiomediastinal silhouette. Endotracheal and nasogastric tubes are unchanged in position. Interval placement of right internal jugular catheter with distal tip looped in superior vena cava. No pneumothorax or pleural effusion is noted. Stable mild left lingular opacity is noted as well as right perihilar opacity, concerning for edema or pneumonia. Bony thorax is unremarkable. IMPRESSION: Interval placement of right internal jugular catheter with distal tip looped within superior vena cava. No pneumothorax is noted. Stable bilateral lung opacities are noted concerning for pneumonia or possibly edema. Electronically Signed   By: Marijo Conception, M.D.   On: 06/24/2018 08:52    ROS: Review of systems not obtained due to patient factors. Physical Exam: Vitals:   06/24/18 0800 06/24/18 0859 06/24/18 0900 06/24/18 1000  BP: 130/64 131/67    Pulse: 70 69 69 69  Resp: (!) 21 (!) 22 (!) 22 20  Temp:    98.8 F (37.1 C)  TempSrc:    Esophageal  SpO2: 100% 100% 100% 100%  Weight:      Height:          Weight change:   Intake/Output Summary (Last 24 hours) at 06/24/2018 1131 Last data filed at 06/24/2018 0826 Gross per 24 hour  Intake 67.6 ml  Output -  Net 67.6 ml   BP  131/67   Pulse 69  Temp 98.8 F (37.1 C) (Esophageal)   Resp 20   Ht 5' 5"  (1.651 m)   Wt 76.3 kg   SpO2 100%   BMI 27.99 kg/m  General appearance: intubated and sedated Full contact physical exam was not possible due to concern for covid-19 infection and rely on the physical exam discussed with PCCM. Dialysis Access: RUE AVF  Dialysis Orders: Center: [facility]  on TTS . EDW 76.5 HD Bath 2K/2.25Ca  Time 4 hrs Heparin none. Access RUE AVF BFR 400 DFR 800, Micera 200 mcg IV every 2 weeks, Venofer 28m IV once per week      Assessment/Plan: 1.  HCAP with severe sepsis syndrome- on abx and r/o covid-19 per PCCM.  currentlyon levophed. 2.  ESRD -  Too unstable for IHD and will plan for CVVHD later today once temp HD catheter is placed 3.  Hypertension/volume  - evidence of pulmonary infiltrates and edema.  Plan for UF with CVVHD as bp tolerates. 4.  Anemia  - stable 5.  Metabolic bone disease -  stable 6.  Nutrition - npo for now 7. AMS- presumably due to pna and sepsis.  Will follow.  JDonetta Potts MD CGrand TowerPager ([phone]/29/2020, 11:31 AM

## 2018-06-24 NOTE — Progress Notes (Signed)
Pharmacy Antibiotic Note  Alexander Whitaker is a 68 y.o. male admitted on 06/24/2018 with pneumonia.  Pharmacy has been consulted for Vancomycin dosing.  ESRD on HD TTS  Pt is a transfer from [place] received at [facility] Vancomycin 1500 mg IV x 1 on 3/28 at 2334 Ceftriaxone on 3/28 at 2118 Zosyn 4.5g IV x 1 on 3/28 at 2145  Plan: Vancomycin 1500 mg IV x 1, then 750 mg IV qHD TTS Ceftriaxone/Azithromycin per MD Trend WBC, temp, HD schedule   F/U infectious work-up Drug levels as indicated   Height: $Remove'5\' 5"'IBEbQWC$  (165.1 cm) Weight: 168 lb 3.4 oz (76.3 kg) IBW/kg (Calculated) : 61.5  Temp (24hrs), Avg:98.7 F (37.1 C), Min:98.7 F (37.1 C), Max:98.7 F (37.1 C)   [name] 06/24/2018 5:21 AM

## 2018-06-24 NOTE — H&P (Signed)
NAME:  Alexander Whitaker, MRN:  [id number], DOB:  1951/02/28, LOS: 0 ADMISSION DATE:  06/24/2018, CONSULTATION DATE:  3/29/202 REFERRING MD:  Oval Linsey, CHIEF COMPLAINT:  AMS/resp failure   Brief History   tx from [place] for AMS/resp failure  History of present illness   67 year old male with history of esrd on hd, cva, htn, chf, presented to [place] with ams.  He had fever of 101.6 at House and only responsive to deep stimuli.  He was intubated.  Cxr showed diffuse infiltrates. Blood sugar of 638.  statted on isulin gtt.   He was given vanc, ceft, azithro.  He was tx today for icu management  Past Medical History  esrd on hd cva  htn chf  Significant Hospital Events   ett 06/23/18 Femoral aline 06/24/18 RIJ 06/24/18  Consults:  none  Procedures:  ett 06/23/18 Femoral aline 06/24/18 RIJ 06/24/18  Significant Diagnostic Tests:  cxr 06/23/18 with diffuse infiltrates  Micro Data:  Sputum cx 06/24/18 Blood cx 06/24/18   Antimicrobials:  vanc 06/23/18-  ceft 06/23/18 azithro 06/23/18   Interim history/subjective:  Hypotensive after admit.  Placed on levophed gtt.  Objective   Blood pressure 131/68, pulse 73, temperature 98.7 F (37.1 C), temperature source Oral, resp. rate (!) 24, height $RemoveBe'5\' 5"'mXqAncNnI$  (1.651 m), weight 76.3 kg, SpO2 100 %.    Vent Mode: PRVC FiO2 (%):  [50 %] 50 % Set Rate:  [20 bmp] 20 bmp Vt Set:  [500 mL] 500 mL PEEP:  [5 cmH20] 5 cmH20 Plateau Pressure:  [18 cmH20] 18 cmH20   Intake/Output Summary (Last 24 hours) at 06/24/2018 0645 Last data filed at 06/24/2018 0600 Gross per 24 hour  Intake 0.28 ml  Output -  Net 0.28 ml   Filed Weights   06/24/18 0408  Weight: 76.3 kg    Examination: General: intubated , sedated; moves all extremities  HENT: perrla Lungs: decreased bs  Cardiovascular: rrr no m/r/g Abdomen: soft nt nd bs+ Extremities: no le edema Neuro: moves all extremities GU:  Resolved Hospital Problem list     Assessment &  Plan:  68 yo male history of esrd on hd, cva, chf presents with ams, cap, HONK  CAP- wbc 21.  cxr with diffuse infiltartes.  Fevers.  On droplet isolation for r/o covid.   - will continue vanc, ceft, azithromycin;  - will continue droplet isolation for r/o covid-19  Septic shock- likely secondary to cap - will get blood culture, sputum cx  - will continue levophed gtt - will hold fluids given ESRD - will continue abx per above - will trend lactate  HONK- likely secodary to cap/sepsis - will continue insulin gtt  Hypothyroid - will continue synthroid   gerd - will continue protonix    Best practice:  Diet: npo Pain/Anxiety/Delirium protocol (if indicated): propofol gtt, fentanyl prn VAP protocol (if indicated):  DVT prophylaxis: lovenox GI prophylaxis: protonix Glucose control: insulin gtt Mobility:  Code Status: full code  Family Communication:  Disposition:   Labs   CBC: No results for input(s): WBC, NEUTROABS, HGB, HCT, MCV, PLT in the last 168 hours.  Basic Metabolic Panel: No results for input(s): NA, K, CL, CO2, GLUCOSE, BUN, CREATININE, CALCIUM, MG, PHOS in the last 168 hours. GFR: CrCl cannot be calculated (Patient's most recent lab result is older than the maximum 21 days allowed.). No results for input(s): PROCALCITON, WBC, LATICACIDVEN in the last 168 hours.  Liver Function Tests: No results for input(s): AST,  ALT, ALKPHOS, BILITOT, PROT, ALBUMIN in the last 168 hours. No results for input(s): LIPASE, AMYLASE in the last 168 hours. No results for input(s): AMMONIA in the last 168 hours.  ABG    Component Value Date/Time   PHART 7.356 02/06/2018 0847   PCO2ART 34.6 02/06/2018 0847   PO2ART 124.0 (H) 02/06/2018 0847   HCO3 19.4 (L) 02/06/2018 0847   TCO2 20 (L) 02/06/2018 0847   ACIDBASEDEF 6.0 (H) 02/06/2018 0847   O2SAT 99.0 02/06/2018 0847     Coagulation Profile: No results for input(s): INR, PROTIME in the last 168 hours.  Cardiac  Enzymes: No results for input(s): CKTOTAL, CKMB, CKMBINDEX, TROPONINI in the last 168 hours.  HbA1C: Hgb A1c MFr Bld  Date/Time Value Ref Range Status  11/21/2017 07:05 PM 9.1 (H) 4.8 - 5.6 % Final    Comment:    (NOTE) Pre diabetes:          5.7%-6.4% Diabetes:              >6.4% Glycemic control for   <7.0% adults with diabetes   03/11/2016 04:12 AM 6.7 (H) 4.8 - 5.6 % Final    Comment:    (NOTE)         Pre-diabetes: 5.7 - 6.4         Diabetes: >6.4         Glycemic control for adults with diabetes: <7.0     CBG: Recent Labs  Lab 06/24/18 0404 06/24/18 0535  GLUCAP 333* 261*    Review of Systems:     Past Medical History  He,  has a past medical history of Anemia, Arthritis, CHF (congestive heart failure) ([place]) (~ 2017), CKD stage 3 due to type 2 diabetes mellitus ([place]), CVA (cerebral vascular accident) ([place]) (2007), Diabetic peripheral neuropathy ([place]), Dyspnea, ESRD (end stage renal disease) on dialysis ([place]), GERD (gastroesophageal reflux disease), Headache, Hypertension, Hypothyroidism, PONV (postoperative nausea and vomiting), Restless legs, and Type II diabetes mellitus ([place]).   Surgical History    Past Surgical History:  Procedure Laterality Date  . AV FISTULA PLACEMENT Right 10/07/2016   Procedure: RIGHT BRACHIOCEPHALIC ARTERIOVENOUS (AV) FISTULA CREATION;  Surgeon: Angelia Mould, MD;  Location: [place];  Service: Vascular;  Laterality: Right;  . BACK SURGERY    . CATARACT EXTRACTION W/ INTRAOCULAR LENS IMPLANT Left   . CIRCUMCISION  2015  . HERNIA REPAIR  2016   "stomach"  . POSTERIOR LUMBAR FUSION  1990s?   "has bolts and rods in there"     Social History   reports that he quit smoking about 14 months ago. His smoking use included cigarettes. He has a 57.00 pack-year smoking history. He has never used smokeless tobacco. He reports previous alcohol use. He reports that he does not use drugs.   Family History   His family history includes CVA  (age of onset: 35) in his father; Leukemia (age of onset: 41) in his mother.   Allergies No Known Allergies   Home Medications  Prior to Admission medications   Medication Sig Start Date End Date Taking? Authorizing Provider  albuterol (PROVENTIL HFA;VENTOLIN HFA) 108 (90 Base) MCG/ACT inhaler Inhale 2 puffs into the lungs every 6 (six) hours as needed for wheezing or shortness of breath.    [provider]  amLODipine (NORVASC) 10 MG tablet Take 1 tablet (10 mg total) by mouth daily. 11/25/17   Doreatha Lew, MD  calcium acetate (PHOSLO) 667 MG capsule Take 1,334 mg by mouth  3 (three) times daily with meals.    [provider]  camphor-menthol Timoteo Ace) lotion Apply 1 application topically as needed for itching.    [provider]  clonazePAM (KLONOPIN) 2 MG tablet Take 2 mg by mouth 3 (three) times daily as needed for anxiety.    [provider]  famotidine (PEPCID) 40 MG tablet Take 40 mg by mouth daily.    [provider]  gabapentin (NEURONTIN) 300 MG capsule Take 300 mg by mouth 2 (two) times daily.    [provider]  hydrALAZINE (APRESOLINE) 25 MG tablet Take 25 mg by mouth 3 (three) times daily.    [provider]  hydrOXYzine (ATARAX/VISTARIL) 25 MG tablet Take 25 mg by mouth 2 (two) times daily as needed for itching.     [provider]  insulin degludec (TRESIBA FLEXTOUCH) 100 UNIT/ML SOPN FlexTouch Pen Inject 50 Units into the skin daily.    [provider]  Insulin Detemir (LEVEMIR) 100 UNIT/ML Pen Inject 10 Units into the skin daily at 12 noon. 10/07/16   Alvia Grove, PA-C  levothyroxine (SYNTHROID, LEVOTHROID) 50 MCG tablet Take 50 mcg by mouth daily before breakfast.  09/19/16   [provider]  lidocaine-prilocaine (EMLA) cream Apply 1 application topically See admin instructions. Apply small amount to access site 1 to 2 hours before dialysis. 11/13/17   [provider]   lisinopril (PRINIVIL,ZESTRIL) 5 MG tablet Take 5 mg by mouth every evening. 01/18/18   [provider]  loratadine (CLARITIN) 10 MG tablet Take 10 mg by mouth daily as needed for rhinitis.     [provider]  Melatonin 10 MG CAPS Take 10 mg by mouth at bedtime as needed (sleep).     [provider]  pantoprazole (PROTONIX) 40 MG tablet Take 40 mg by mouth daily.    [provider]  pramipexole (MIRAPEX) 0.5 MG tablet Take 0.5 mg by mouth at bedtime as needed (sleep).    [provider]     Critical care time: 45 minutes

## 2018-06-24 NOTE — Procedures (Signed)
Hemodialysis Catheter Insertion Procedure Note Alexander Whitaker [id number] [birth year]  Procedure: Insertion of Hemodialysis Catheter Indications: Dialysis Access   Procedure Details Consent: Risks of procedure as well as the alternatives and risks of each were explained to the (patient/caregiver).  Consent for procedure obtained. Time Out: Verified patient identification, verified procedure, site/side was marked, verified correct patient position, special equipment/implants available, medications/allergies/relevent history reviewed, required imaging and test results available.  Performed  Maximum sterile technique was used including antiseptics, cap, gloves, gown, hand hygiene, mask and sheet. Skin prep: Chlorhexidine; local anesthetic administered  Changed R IJ CVC over guidewire using sterile technique, placed a 15cm HD catheter, three lumens.   Triple lumen hemodialysis catheter was inserted into right internal jugular vein using the Seldinger technique.  Evaluation Blood flow good Complications: No apparent complications Patient did tolerate procedure well. Chest X-ray ordered to verify placement.  CXR: pending.   Baltazar Apo, MD, PhD 06/24/2018, 5:34 PM [facility] [phone] or if no answer [phone]

## 2018-06-24 NOTE — Progress Notes (Signed)
NAME:  Alexander Whitaker, MRN:  [id number], DOB:  April 23, 1950, LOS: 0 ADMISSION DATE:  06/24/2018, CONSULTATION DATE:  3/29/202 REFERRING MD:  Oval Linsey, CHIEF COMPLAINT:  AMS/resp failure   Brief History   tx from [place] for AMS/resp failure  History of present illness   68 year old male with history of esrd on hd, cva, htn, chf, presented to [place] with ams.  He had fever of 101.6 at [place] and only responsive to deep stimuli.  He was intubated.  Cxr showed diffuse infiltrates. Blood sugar of 638.  statted on isulin gtt.   He was given vanc, ceft, azithro.  He was tx today for icu management  Past Medical History  esrd on hd cva  htn chf  Significant Hospital Events   ett 06/23/18 Femoral aline 06/24/18 RIJ 06/24/18  Consults:  none  Procedures:  ett 06/23/18 Femoral aline 06/24/18 RIJ 06/24/18  Significant Diagnostic Tests:  cxr 06/23/18 with diffuse infiltrates  Micro Data:  Sputum cx 06/24/18 Blood cx 06/24/18   Antimicrobials:  vanc 06/23/18-  ceft 06/23/18 azithro 06/23/18   Interim history/subjective:  Remains on low-dose Levophed, being weaned His central line is curved in the SVC based on evaluation of his chest x-ray.  He has bilateral moderate interstitial infiltrates.  Remains on propofol.  He was started on insulin drip and remains on this as well.  Objective   Blood pressure 132/84, pulse 81, temperature (!) 100.8 F (38.2 C), resp. rate (!) 21, height $RemoveBe'5\' 5"'haxfOrTUr$  (1.651 m), weight 76.3 kg, SpO2 99 %.    Vent Mode: PRVC FiO2 (%):  [40 %-50 %] 40 % Set Rate:  [20 bmp] 20 bmp Vt Set:  [500 mL] 500 mL PEEP:  [5 cmH20] 5 cmH20 Plateau Pressure:  [14 cmH20-18 cmH20] 14 cmH20   Intake/Output Summary (Last 24 hours) at 06/24/2018 1851 Last data filed at 06/24/2018 1300 Gross per 24 hour  Intake 709.02 ml  Output 100 ml  Net 609.02 ml   Filed Weights   06/24/18 0408  Weight: 76.3 kg    Examination: General: intubated , sedated; moves all extremities   HENT: perrla Lungs: decreased bs  Cardiovascular: rrr no m/r/g Abdomen: soft nt nd bs+ Extremities: no le edema Neuro: moves all extremities GU:  Resolved Hospital Problem list     Assessment & Plan:  68 yo male history of esrd on hd, cva, chf presents with ams, cap, HONK  CAP- wbc 21.  cxr with diffuse infiltartes.  Fevers.  On droplet isolation for r/o covid.   -Treating for community-acquired pneumonia -At risk for COVID-19, testing performed and result pending  Septic shock- likely secondary to cap Weaning norepinephrine as able Follow lactate for clearance Antibiotics as above  HONK- likely secodary to cap/sepsis Anion gap 15 pending hemodialysis  End-stage renal disease.  He has not missed any dialysis treatments.  He is due for dialysis but this will likely need to be CVVHD given his continued pressor need. I will exchange an HD catheter for his existing right IJ over a guidewire  Hypothyroid - will continue synthroid   gerd - will continue protonix    Best practice:  Diet: npo Pain/Anxiety/Delirium protocol (if indicated): propofol gtt, fentanyl prn VAP protocol (if indicated):  DVT prophylaxis: lovenox GI prophylaxis: protonix Glucose control: insulin gtt Mobility:  Code Status: full code  Family Communication:  Disposition:   Labs   CBC: Recent Labs  Lab 06/24/18 0639  WBC 23.4*  NEUTROABS 19.3*  HGB  11.3*  HCT 34.7*  MCV 90.6  PLT 782    Basic Metabolic Panel: Recent Labs  Lab 06/24/18 0639  NA 135  K 3.9  CL 99  CO2 21*  GLUCOSE 213*  BUN 53*  CREATININE 5.94*  CALCIUM 7.4*   GFR: Estimated Creatinine Clearance: 11.2 mL/min (A) (by C-G formula based on SCr of 5.94 mg/dL (H)). Recent Labs  Lab 06/24/18 0639 06/24/18 0650 06/24/18 0803  WBC 23.4*  --   --   LATICACIDVEN  --  1.9 1.6    Liver Function Tests: Recent Labs  Lab 06/24/18 0639  AST 18  ALT 11  ALKPHOS 83  BILITOT 0.7  PROT 4.9*  ALBUMIN 2.5*   No  results for input(s): LIPASE, AMYLASE in the last 168 hours. No results for input(s): AMMONIA in the last 168 hours.  ABG    Component Value Date/Time   PHART 7.356 02/06/2018 0847   PCO2ART 34.6 02/06/2018 0847   PO2ART 124.0 (H) 02/06/2018 0847   HCO3 19.4 (L) 02/06/2018 0847   TCO2 20 (L) 02/06/2018 0847   ACIDBASEDEF 6.0 (H) 02/06/2018 0847   O2SAT 99.0 02/06/2018 0847     Coagulation Profile: No results for input(s): INR, PROTIME in the last 168 hours.  Cardiac Enzymes: No results for input(s): CKTOTAL, CKMB, CKMBINDEX, TROPONINI in the last 168 hours.  HbA1C: Hgb A1c MFr Bld  Date/Time Value Ref Range Status  11/21/2017 07:05 PM 9.1 (H) 4.8 - 5.6 % Final    Comment:    (NOTE) Pre diabetes:          5.7%-6.4% Diabetes:              >6.4% Glycemic control for   <7.0% adults with diabetes   03/11/2016 04:12 AM 6.7 (H) 4.8 - 5.6 % Final    Comment:    (NOTE)         Pre-diabetes: 5.7 - 6.4         Diabetes: >6.4         Glycemic control for adults with diabetes: <7.0     CBG: Recent Labs  Lab 06/24/18 0924 06/24/18 1038 06/24/18 1141 06/24/18 1251 06/24/18 1751  GLUCAP 160* 129* 118* 147* 218*     Critical care time: 32 minutes     Baltazar Apo, MD, PhD 06/24/2018, 6:55 PM [facility] [phone] or if no answer [phone]

## 2018-06-24 NOTE — Progress Notes (Signed)
Pharmacy Antibiotic Note  Alexander Whitaker is a 68 y.o. male transferred from [facility] on 06/24/2018 with pneumonia. Pharmacy has been consulted for vancomycin dosing. Currently AF, WBC 23.5. Patient is ESRD on HD but unable to tolerate. Plans to start CRRT later today pending catheter placement.  Antibiotics received at [facility]: Vancomycin 1500 mg IV x1 on 3/28 at 2334 Ceftriaxone 2g IV x1 on 3/28 at 2118 Zosyn 4.5g IV x1 on 3/28 at 2145  Plan: Adjust to vancomycin $RemoveBefor'750mg'pqDwbeYdoUDM$  IV q24h for CRRT Continue ceftriaxone and azithromycin per MD F/u clinical status, CRRT start/tolerance, renal function, C&S, LOT, de-escalation, vancomycin levels as appropriate  Height: $Remove'5\' 5"'JsDeSUe$  (165.1 cm) Weight: 168 lb 3.4 oz (76.3 kg) IBW/kg (Calculated) : 61.5  Temp (24hrs), Avg:98.7 F (37.1 C), Min:98.6 F (37 C), Max:99 F (37.2 C)  Recent Labs  Lab 06/24/18 0639 06/24/18 0650 06/24/18 0803  WBC 23.4*  --   --   CREATININE 5.94*  --   --   LATICACIDVEN  --  1.9 1.6    Estimated Creatinine Clearance: 11.2 mL/min (A) (by C-G formula based on SCr of 5.94 mg/dL (H)).    No Known Allergies  Antimicrobials this admission: CTX 3/29 >> Azithro 3/29 >> Vanc 3/29 >>  Microbiology results: 3/29 TA cx: budding yeast 3/29 Bcx: collected 3/29 MRSA PCR: negative  Thank you for allowing pharmacy to be a part of this patient's care.  Mila Merry Gerarda Fraction, PharmD, [place] PGY2 Infectious Diseases Pharmacy Resident Phone: [phone] 06/24/2018 1:12 PM

## 2018-06-25 ENCOUNTER — Inpatient Hospital Stay (HOSPITAL_COMMUNITY): Payer: Medicare HMO

## 2018-06-25 DIAGNOSIS — J9601 Acute respiratory failure with hypoxia: Secondary | ICD-10-CM

## 2018-06-25 DIAGNOSIS — G9340 Encephalopathy, unspecified: Secondary | ICD-10-CM

## 2018-06-25 DIAGNOSIS — N179 Acute kidney failure, unspecified: Secondary | ICD-10-CM

## 2018-06-25 LAB — RENAL FUNCTION PANEL
ANION GAP: 15 (ref 5–15)
Albumin: 2.5 g/dL — ABNORMAL LOW (ref 3.5–5.0)
Albumin: 2.5 g/dL — ABNORMAL LOW (ref 3.5–5.0)
Anion gap: 8 (ref 5–15)
BUN: 37 mg/dL — ABNORMAL HIGH (ref 8–23)
BUN: 25 mg/dL — AB (ref 8–23)
CALCIUM: 7.5 mg/dL — AB (ref 8.9–10.3)
CO2: 21 mmol/L — ABNORMAL LOW (ref 22–32)
CO2: 24 mmol/L (ref 22–32)
CREATININE: 3.43 mg/dL — AB (ref 0.61–1.24)
Calcium: 7.5 mg/dL — ABNORMAL LOW (ref 8.9–10.3)
Chloride: 101 mmol/L (ref 98–111)
Chloride: 104 mmol/L (ref 98–111)
Creatinine, Ser: 4.45 mg/dL — ABNORMAL HIGH (ref 0.61–1.24)
GFR calc Af Amer: 15 mL/min — ABNORMAL LOW (ref >60)
GFR calc Af Amer: 20 mL/min — ABNORMAL LOW (ref >60)
GFR calc non Af Amer: 13 mL/min — ABNORMAL LOW (ref >60)
GFR calc non Af Amer: 17 mL/min — ABNORMAL LOW (ref >60)
Glucose, Bld: 143 mg/dL — ABNORMAL HIGH (ref 70–99)
Glucose, Bld: 152 mg/dL — ABNORMAL HIGH (ref 70–99)
POTASSIUM: 3.9 mmol/L (ref 3.5–5.1)
Phosphorus: 2.5 mg/dL (ref 2.5–4.6)
Phosphorus: 2.8 mg/dL (ref 2.5–4.6)
Potassium: 4.4 mmol/L (ref 3.5–5.1)
SODIUM: 136 mmol/L (ref 135–145)
Sodium: 137 mmol/L (ref 135–145)

## 2018-06-25 LAB — LACTIC ACID, PLASMA: Lactic Acid, Venous: 1.4 mmol/L (ref 0.5–1.9)

## 2018-06-25 LAB — CBC
HCT: 33.3 % — ABNORMAL LOW (ref 39.0–52.0)
Hemoglobin: 10.7 g/dL — ABNORMAL LOW (ref 13.0–17.0)
MCH: 30 pg (ref 26.0–34.0)
MCHC: 32.1 g/dL (ref 30.0–36.0)
MCV: 93.3 fL (ref 80.0–100.0)
Platelets: 208 10*3/uL (ref 150–400)
RBC: 3.57 MIL/uL — ABNORMAL LOW (ref 4.22–5.81)
RDW: 15 % (ref 11.5–15.5)
WBC: 18.9 10*3/uL — ABNORMAL HIGH (ref 4.0–10.5)
nRBC: 0 % (ref 0.0–0.2)

## 2018-06-25 LAB — GLUCOSE, CAPILLARY
Glucose-Capillary: 122 mg/dL — ABNORMAL HIGH (ref 70–99)
Glucose-Capillary: 123 mg/dL — ABNORMAL HIGH (ref 70–99)
Glucose-Capillary: 128 mg/dL — ABNORMAL HIGH (ref 70–99)
Glucose-Capillary: 134 mg/dL — ABNORMAL HIGH (ref 70–99)
Glucose-Capillary: 138 mg/dL — ABNORMAL HIGH (ref 70–99)

## 2018-06-25 LAB — MAGNESIUM: Magnesium: 1.9 mg/dL (ref 1.7–2.4)

## 2018-06-25 MED ORDER — FENTANYL BOLUS VIA INFUSION
25.0000 ug | INTRAVENOUS | Status: DC | PRN
  Administered 2018-06-25 – 2018-06-26 (×3): 25 ug via INTRAVENOUS
  Filled 2018-06-25: qty 25

## 2018-06-25 MED ORDER — FENTANYL CITRATE (PF) 100 MCG/2ML IJ SOLN
50.0000 ug | Freq: Once | INTRAMUSCULAR | Status: AC
  Administered 2018-06-25: 50 ug via INTRAVENOUS

## 2018-06-25 MED ORDER — FENTANYL 2500MCG IN NS 250ML (10MCG/ML) PREMIX INFUSION
25.0000 ug/h | INTRAVENOUS | Status: DC
  Administered 2018-06-25: 300 ug/h via INTRAVENOUS
  Administered 2018-06-25: 50 ug/h via INTRAVENOUS
  Administered 2018-06-26: 200 ug/h via INTRAVENOUS
  Administered 2018-06-27: 19:00:00 300 ug/h via INTRAVENOUS
  Administered 2018-06-27: 05:00:00 200 ug/h via INTRAVENOUS
  Administered 2018-06-28: 250 ug/h via INTRAVENOUS
  Administered 2018-06-28: 200 ug/h via INTRAVENOUS
  Administered 2018-06-29: 50 ug/h via INTRAVENOUS
  Filled 2018-06-25 (×8): qty 250

## 2018-06-25 MED ORDER — PRO-STAT SUGAR FREE PO LIQD
30.0000 mL | Freq: Four times a day (QID) | ORAL | Status: DC
  Administered 2018-06-25 – 2018-06-30 (×13): 30 mL
  Filled 2018-06-25 (×15): qty 30

## 2018-06-25 MED ORDER — VITAL 1.5 CAL PO LIQD
1000.0000 mL | ORAL | Status: DC
  Administered 2018-06-25 – 2018-06-29 (×3): 1000 mL
  Filled 2018-06-25 (×6): qty 1000

## 2018-06-25 NOTE — Significant Event (Signed)
Spoke with RD who called regarding nutrition status and need for feeding tube. Per RD, Dr. Nelda Marseille is giving verbal order for gastric tub placement. No feeding tube in unit-have ordered and requesting one from another unit. Will place as soon as it gets here.    Alexander Whitaker

## 2018-06-25 NOTE — Progress Notes (Addendum)
Initial Nutrition Assessment RD working remotely.  DOCUMENTATION CODES:   Not applicable  INTERVENTION:   RN to attempt to place OGT/NGT for TF:  Vital 1.5 at 40 ml/h (960 ml per day)  Pro-stat 30 ml QID  Provides 1840 kcal, 125 gm protein, 733 ml free water daily  NUTRITION DIAGNOSIS:   Inadequate oral intake related to inability to eat as evidenced by NPO status.  GOAL:   Patient will meet greater than or equal to 90% of their needs  MONITOR:   Vent status, Labs, Skin, I & O's  REASON FOR ASSESSMENT:   Ventilator    ASSESSMENT:   68 yo male with PMH of HTN, dyspnea, ESRD on HD, CHF, DM-2, CVA who was admitted from [facility] with AMS, respiratory failure requiring intubation.  Receiving CRRT. Generalized edema present per flow sheets. Patient was able to wean a little this morning.  COVID-19 test results pending. OGT was pulled by patient x 2 per RN. No enteral access available at this time. Cortrak cannot be placed because patient is r/o COVID-19. RN aware and will attempt OG/NG tube placement. Spoke with MD; RD to order TF.  Patient is currently intubated on ventilator support MV: 10 L/min Temp (24hrs), Avg:97.4 F (36.3 C), Min:95.4 F (35.2 C), Max:100.8 F (38.2 C)   Labs reviewed. BUN 37 (H), creatinine 4.45 (H), potassium 3.9 (WNL), phosphorus 2.5 (WNL) CBG's: 132-440-102  Medications reviewed and include Novolog, Levophed.  NUTRITION - FOCUSED PHYSICAL EXAM:  unable to complete  Diet Order:   Diet Order    None      EDUCATION NEEDS:   No education needs have been identified at this time  Skin:  Skin Assessment: Reviewed RN Assessment  Last BM:  3/29  Height:   Ht Readings from Last 1 Encounters:  06/24/18 $RemoveB'5\' 5"'YDcVDzCl$  (1.651 m)    Weight:   Wt Readings from Last 1 Encounters:  06/25/18 73.8 kg    Ideal Body Weight:  61.8 kg  BMI:  Body mass index is 27.07 kg/m.  Estimated Nutritional Needs:   Kcal:   1855  Protein:  105-125 gm  Fluid:  1 L + UOP    Molli Barrows, RD, LDN, [place] Pager [phone] After Hours Pager [phone]

## 2018-06-25 NOTE — Progress Notes (Addendum)
NAME:  Alexander Whitaker, MRN:  [id number], DOB:  Jul 21, 1950, LOS: 1 ADMISSION DATE:  06/24/2018, CONSULTATION DATE:  3/29/202 REFERRING MD:  Oval Linsey, CHIEF COMPLAINT:  AMS/resp failure   Brief History   tx from [place] for AMS/resp failure  History of present illness   68 year old male with history of esrd on hd, cva, htn, chf, presented to [place] with ams.  He had fever of 101.6 at [place] and only responsive to deep stimuli.  He was intubated.  Cxr showed diffuse infiltrates. Blood sugar of 638.  statted on isulin gtt.   He was given vanc, ceft, azithro.  He was tx today for icu management  Past Medical History  esrd on hd cva  htn chf  Significant Hospital Events   ett 06/23/18 Femoral aline 06/24/18 RIJ 06/24/18  Consults:  Nephrology   Procedures:  ett 06/23/18 Femoral aline 06/24/18 RIJ 06/24/18  Significant Diagnostic Tests:  cxr 06/23/18 with diffuse infiltrates CXR 3/30- bilateral opacities, personally reviewed   Micro Data:  Sputum cx 06/24/18 Blood cx 06/24/18   Antimicrobials:  vanc 06/23/18-  ceft 06/23/18 azithro 06/23/18   Interim history/subjective:  Vent wean this morning for a couple of hours until became hypotensive Increasing pressor requirement now Continues on CRRT  Objective   Blood pressure 132/60, pulse 65, temperature (!) 97.2 F (36.2 C), resp. rate 15, height $RemoveBe'5\' 5"'DuktrYrBq$  (1.651 m), weight 73.8 kg, SpO2 100 %.    Vent Mode: PSV;CPAP FiO2 (%):  [40 %] 40 % Set Rate:  [20 bmp] 20 bmp Vt Set:  [500 mL] 500 mL PEEP:  [5 cmH20] 5 cmH20 Pressure Support:  [10 cmH20] 10 cmH20 Plateau Pressure:  [18 cmH20] 18 cmH20   Intake/Output Summary (Last 24 hours) at 06/25/2018 1051 Last data filed at 06/25/2018 1000 Gross per 24 hour  Intake 1875.45 ml  Output 2663 ml  Net -787.55 ml   Filed Weights   06/24/18 0408 06/25/18 0500  Weight: 76.3 kg 73.8 kg    Examination: General: WDWN adult male, intubated, sedated, NAD  HENT: NCAT. Pink mmm.  ETT secure  Lungs:Tan secretions. Symmetrical chest expansion. Bilateral rhonchi.   Cardiovascular: RRR s1s2 no r/g/m  Abdomen: Soft, round, ndnt, normoactive x4  Extremities: Symmetrical bulk and tone, no obvious deformity, no extremity edema  Neuro: Sedated. PERRL. Opens eyes to stimuli. Follows simple commands.  Skin: clean, dry, warm. Scattered scabbed abrasions on BUE. No rash   Resolved Hospital Problem list     Assessment & Plan:    Acute Respiratory Failure with hypoxia ?CAP- wbc 21.  cxr with diffuse infiltartes.  Fevers.   Pulmonary Edema P Continue azithromyxin/ceftriaone/ vanc  -Follow up tracheal aspirate  -On droplet isolation for r/o covid.    -At risk for COVID-19, testing performed and result pending -Continue pulmonary hygiene  -AM WUA/SBT  -AM CXR   Septic shock- likely secondary to cap Aspect of hypotension related to propofol  P MAP goal > 65 Continue norepi, titrate PRN Will change primary sedation from propofol gtt to fentanyl gtt  Azithromycin/ceftriaxone/vancomycin  Follow up BCx  Trend WBC, fever   HONK, likely in setting of septic shock, resolved  DM -no longer on insulin gtt P -Continue SSI  -CBG q4   End-stage renal disease.   -compliant with iHD but given pressor requirement inpatient, is on CRRT  P Nephrology following, appreciate recs Continue CRRT  Hypothyroidism - will continue synthroid   GERD  - Continue Protonix  Best practice:  Diet: NPO  Pain/Anxiety/Delirium protocol (if indicated): Fentanyl gtt, PRN fent  VAP protocol (if indicated): continue  DVT prophylaxis: SQ Heparin  GI prophylaxis: protonix Glucose control: SSI Mobility: bedrest  Code Status: full code  Family Communication:  None  Disposition: Continue ICU level of care   Labs   CBC: Recent Labs  Lab 06/24/18 0639 06/25/18 0443  WBC 23.4* 18.9*  NEUTROABS 19.3*  --   HGB 11.3* 10.7*  HCT 34.7* 33.3*  MCV 90.6 93.3  PLT 281 208    Basic  Metabolic Panel: Recent Labs  Lab 06/24/18 0639 06/25/18 0443  NA 135 137  K 3.9 3.9  CL 99 101  CO2 21* 21*  GLUCOSE 213* 143*  BUN 53* 37*  CREATININE 5.94* 4.45*  CALCIUM 7.4* 7.5*  MG  --  1.9  PHOS  --  2.5   GFR: Estimated Creatinine Clearance: 14.7 mL/min (A) (by C-G formula based on SCr of 4.45 mg/dL (H)). Recent Labs  Lab 06/24/18 0639 06/24/18 0650 06/24/18 0803 06/25/18 0443  WBC 23.4*  --   --  18.9*  LATICACIDVEN  --  1.9 1.6 1.4    Liver Function Tests: Recent Labs  Lab 06/24/18 0639 06/25/18 0443  AST 18  --   ALT 11  --   ALKPHOS 83  --   BILITOT 0.7  --   PROT 4.9*  --   ALBUMIN 2.5* 2.5*   No results for input(s): LIPASE, AMYLASE in the last 168 hours. No results for input(s): AMMONIA in the last 168 hours.  ABG    Component Value Date/Time   PHART 7.356 02/06/2018 0847   PCO2ART 34.6 02/06/2018 0847   PO2ART 124.0 (H) 02/06/2018 0847   HCO3 19.4 (L) 02/06/2018 0847   TCO2 20 (L) 02/06/2018 0847   ACIDBASEDEF 6.0 (H) 02/06/2018 0847   O2SAT 99.0 02/06/2018 0847     Coagulation Profile: No results for input(s): INR, PROTIME in the last 168 hours.  Cardiac Enzymes: No results for input(s): CKTOTAL, CKMB, CKMBINDEX, TROPONINI in the last 168 hours.  HbA1C: Hgb A1c MFr Bld  Date/Time Value Ref Range Status  11/21/2017 07:05 PM 9.1 (H) 4.8 - 5.6 % Final    Comment:    (NOTE) Pre diabetes:          5.7%-6.4% Diabetes:              >6.4% Glycemic control for   <7.0% adults with diabetes   03/11/2016 04:12 AM 6.7 (H) 4.8 - 5.6 % Final    Comment:    (NOTE)         Pre-diabetes: 5.7 - 6.4         Diabetes: >6.4         Glycemic control for adults with diabetes: <7.0     CBG: Recent Labs  Lab 06/24/18 1751 06/24/18 1955 06/25/18 0012 06/25/18 0441 06/25/18 0914  GLUCAP 218* 189* 138* 128* 122*     Critical care time: 35 minutes      Eliseo Gum MSN, AGACNP-BC [place] [phone]  If no answer, [phone] 06/25/2018, 10:51 AM  Attending Note:  68 year old male with PMH of CVA, HTN and CHF presenting from [place] with AMS, fever, septic shock and respiratory failure and now developed renal failure.  Weaned for 3 hours this AM but BP started dropping and patient was placed back on full vent support.  On exam, lungs with coarse BS diffusely.  I  reviewed CXR myself, ETT is in a good position.  Discussed with PCCM-NP.  Will hold further weaning for now.  Levophed increased.  D/C propofol to assist with hemodynamics.  Fentanyl drip for sedation.  Continue abx.  PCCM will continue to manage.  The patient is critically ill with multiple organ systems failure and requires high complexity decision making for assessment and support, frequent evaluation and titration of therapies, application of advanced monitoring technologies and extensive interpretation of multiple databases.   Critical Care Time devoted to patient care services described in this note is  32  Minutes. This time reflects time of care of this signee Dr Jennet Maduro. This critical care time does not reflect procedure time, or teaching time or supervisory time of PA/NP/Med student/Med Resident etc but could involve care discussion time.  Rush Farmer, M.D. [facility] Pulmonary/Critical Care Medicine. Pager: [phone]. After hours pager: [phone].

## 2018-06-25 NOTE — Progress Notes (Signed)
[facility] Progress Note  Subjective: on vent  Vitals:   06/25/18 1215 06/25/18 1230 06/25/18 1245 06/25/18 1300  BP:  (!) 148/66  (!) 108/59  Pulse: 69 74 70 69  Resp: 15 (!) 24 18 20   Temp: (!) 95.9 F (35.5 C) (!) 96.3 F (35.7 C) (!) 96.4 F (35.8 C) (!) 96.6 F (35.9 C)  TempSrc:    Esophageal  SpO2: 100% 100% 100% 100%  Weight:      Height:        Inpatient medications: . chlorhexidine gluconate (MEDLINE KIT)  15 mL Mouth Rinse BID  . Chlorhexidine Gluconate Cloth  6 each Topical Daily  . feeding supplement (PRO-STAT SUGAR FREE 64)  30 mL Per Tube QID  . feeding supplement (VITAL 1.5 CAL)  1,000 mL Per Tube Q24H  . heparin injection (subcutaneous)  5,000 Units Subcutaneous Q8H  . insulin aspart  0-24 Units Subcutaneous Q4H  . levothyroxine  50 mcg Oral Q0600  . mouth rinse  15 mL Mouth Rinse 10 times per day  . pantoprazole (PROTONIX) IV  40 mg Intravenous QHS  . sodium chloride flush  10-40 mL Intracatheter Q12H   .  prismasol BGK 4/2.5 500 mL/hr at 06/25/18 0910  .  prismasol BGK 4/2.5 300 mL/hr at 06/25/18 0910  . azithromycin Stopped (06/25/18 0558)  . cefTRIAXone (ROCEPHIN)  IV Stopped (06/24/18 2132)  . dextrose 5 % and 0.45% NaCl Stopped (06/24/18 1402)  . fentaNYL infusion INTRAVENOUS 50 mcg/hr (06/25/18 1220)  . norepinephrine (LEVOPHED) Adult infusion Stopped (06/25/18 1250)  . prismasol BGK 4/2.5 1,500 mL/hr at 06/25/18 1235  . vancomycin Stopped (06/24/18 2245)   acetaminophen, docusate, fentaNYL, heparin, heparin, midazolam, midazolam, sodium chloride flush  Iron/TIBC/Ferritin/ %Sat    Component Value Date/Time   IRON 46 03/11/2016 0412   TIBC 207 (L) 03/11/2016 0412   FERRITIN 222 03/11/2016 0412   IRONPCTSAT 22 03/11/2016 0412    Exam: General appearance: intubated and sedated Full contact physical exam was not recommended due to concern for covid-19 infection, utilizing the physical exam of CCM Dr Algernon Huxley TTS  4h  76.5kg   2/2.25 bath  Hep none  RUE AVF  400/800  - Micera 200 mcg IV every 2 weeks  - Venofer 35m IV once per week      Assessment/Plan: 1.  HCAP/ severe sepsis syndrome/ fever- was febrile now hypothermic. Is getting IV abx and r/o covid-19 per PCCM.  currently on levophed, weaning down the pressor dose. 2.  ESRD -  Usual HD TTS, pt too unstable for IHD, started CVVHD on 3/29 yesterday.  3.  Hypertension/volume  - evidence of perhilar pulmonary infiltrates/+ edema. CXR may have improved some since admission.  UF with CVVHD as bp tolerates. Net neg 1.0 L since CRRT started. Wt's down slightly 76 > 74kg (dry wt 76).  4.  Anemia  - stable 5.  Metabolic bone disease -  stable 6.  Nutrition - npo for now 7. AMS- presumably due to pna and sepsis.  Will follow.   [facility] 06/25/2018, 1:10 PM  Recent Labs  Lab 06/24/18 0639 06/25/18 0443  NA 135 137  K 3.9 3.9  CL 99 101  CO2 21* 21*  GLUCOSE 213* 143*  BUN 53* 37*  CREATININE 5.94* 4.45*  CALCIUM 7.4* 7.5*  PHOS  --  2.5  ALBUMIN 2.5* 2.5*   Recent Labs  Lab 06/24/18 0639  AST 18  ALT 11  ALKPHOS 83  BILITOT 0.7  PROT 4.9*   Recent Labs  Lab 06/24/18 0639 06/25/18 0443  WBC 23.4* 18.9*  NEUTROABS 19.3*  --   HGB 11.3* 10.7*  HCT 34.7* 33.3*  MCV 90.6 93.3  PLT 281 208

## 2018-06-26 ENCOUNTER — Inpatient Hospital Stay (HOSPITAL_COMMUNITY): Payer: Medicare HMO

## 2018-06-26 DIAGNOSIS — E1129 Type 2 diabetes mellitus with other diabetic kidney complication: Secondary | ICD-10-CM | POA: Diagnosis not present

## 2018-06-26 DIAGNOSIS — N186 End stage renal disease: Secondary | ICD-10-CM | POA: Diagnosis not present

## 2018-06-26 DIAGNOSIS — Z992 Dependence on renal dialysis: Secondary | ICD-10-CM | POA: Diagnosis not present

## 2018-06-26 LAB — GLUCOSE, CAPILLARY
GLUCOSE-CAPILLARY: 137 mg/dL — AB (ref 70–99)
Glucose-Capillary: 157 mg/dL — ABNORMAL HIGH (ref 70–99)
Glucose-Capillary: 128 mg/dL — ABNORMAL HIGH (ref 70–99)
Glucose-Capillary: 167 mg/dL — ABNORMAL HIGH (ref 70–99)
Glucose-Capillary: 209 mg/dL — ABNORMAL HIGH (ref 70–99)
Glucose-Capillary: 81 mg/dL (ref 70–99)

## 2018-06-26 LAB — BASIC METABOLIC PANEL
Anion gap: 12 (ref 5–15)
BUN: 23 mg/dL (ref 8–23)
CO2: 19 mmol/L — ABNORMAL LOW (ref 22–32)
CREATININE: 3.2 mg/dL — AB (ref 0.61–1.24)
Calcium: 7.8 mg/dL — ABNORMAL LOW (ref 8.9–10.3)
Chloride: 105 mmol/L (ref 98–111)
GFR calc Af Amer: 22 mL/min — ABNORMAL LOW (ref >60)
GFR calc non Af Amer: 19 mL/min — ABNORMAL LOW (ref >60)
Glucose, Bld: 109 mg/dL — ABNORMAL HIGH (ref 70–99)
Potassium: 3.9 mmol/L (ref 3.5–5.1)
Sodium: 136 mmol/L (ref 135–145)

## 2018-06-26 LAB — RENAL FUNCTION PANEL
Albumin: 2.8 g/dL — ABNORMAL LOW (ref 3.5–5.0)
Anion gap: 12 (ref 5–15)
BUN: 17 mg/dL (ref 8–23)
CO2: 23 mmol/L (ref 22–32)
Calcium: 7.8 mg/dL — ABNORMAL LOW (ref 8.9–10.3)
Chloride: 103 mmol/L (ref 98–111)
Creatinine, Ser: 2.86 mg/dL — ABNORMAL HIGH (ref 0.61–1.24)
GFR calc Af Amer: 25 mL/min — ABNORMAL LOW (ref >60)
GFR calc non Af Amer: 21 mL/min — ABNORMAL LOW (ref >60)
Glucose, Bld: 131 mg/dL — ABNORMAL HIGH (ref 70–99)
Phosphorus: 3.8 mg/dL (ref 2.5–4.6)
Potassium: 4.3 mmol/L (ref 3.5–5.1)
Sodium: 138 mmol/L (ref 135–145)

## 2018-06-26 LAB — MAGNESIUM
Magnesium: 2.3 mg/dL (ref 1.7–2.4)
Magnesium: 2.5 mg/dL — ABNORMAL HIGH (ref 1.7–2.4)

## 2018-06-26 LAB — CBC
HCT: 34.8 % — ABNORMAL LOW (ref 39.0–52.0)
HEMOGLOBIN: 10.9 g/dL — AB (ref 13.0–17.0)
MCH: 31 pg (ref 26.0–34.0)
MCHC: 31.3 g/dL (ref 30.0–36.0)
MCV: 98.9 fL (ref 80.0–100.0)
PLATELETS: 147 10*3/uL — AB (ref 150–400)
RBC: 3.52 MIL/uL — ABNORMAL LOW (ref 4.22–5.81)
RDW: 15.4 % (ref 11.5–15.5)
WBC: 11.8 10*3/uL — ABNORMAL HIGH (ref 4.0–10.5)
nRBC: 0 % (ref 0.0–0.2)

## 2018-06-26 LAB — TROPONIN I: TROPONIN I: 2.05 ng/mL — AB (ref <0.03)

## 2018-06-26 MED ORDER — ASPIRIN 81 MG PO CHEW: 81.0000 mg | CHEWABLE_TABLET | Freq: Every day | ORAL | Status: DC

## 2018-06-26 MED ORDER — CLONAZEPAM 1 MG PO TABS
1.0000 mg | ORAL_TABLET | Freq: Three times a day (TID) | ORAL | Status: DC
  Administered 2018-06-26 – 2018-06-27 (×2): 1 mg
  Filled 2018-06-26 (×2): qty 1

## 2018-06-26 MED ORDER — METOPROLOL TARTRATE 5 MG/5ML IV SOLN
INTRAVENOUS | Status: AC
  Administered 2018-06-26: 5 mg
  Filled 2018-06-26: qty 5

## 2018-06-26 MED ORDER — ONDANSETRON HCL 4 MG/2ML IJ SOLN
4.0000 mg | Freq: Four times a day (QID) | INTRAMUSCULAR | Status: DC | PRN
  Administered 2018-06-26 – 2018-07-01 (×2): 4 mg via INTRAVENOUS
  Filled 2018-06-26: qty 2

## 2018-06-26 MED ORDER — AMIODARONE IV BOLUS ONLY 150 MG/100ML
150.0000 mg | Freq: Once | INTRAVENOUS | Status: DC
  Filled 2018-06-26: qty 100

## 2018-06-26 MED ORDER — METOPROLOL TARTRATE 5 MG/5ML IV SOLN
5.0000 mg | Freq: Once | INTRAVENOUS | Status: AC
  Administered 2018-06-26: 5 mg via INTRAVENOUS
  Filled 2018-06-26: qty 5

## 2018-06-26 MED ORDER — AMIODARONE IV BOLUS ONLY 150 MG/100ML
150.0000 mg | Freq: Once | INTRAVENOUS | Status: AC
  Administered 2018-06-26: 150 mg via INTRAVENOUS

## 2018-06-26 MED ORDER — METOPROLOL TARTRATE 5 MG/5ML IV SOLN: 5.0000 mg | Freq: Once | INTRAVENOUS | Status: AC

## 2018-06-26 MED ORDER — ONDANSETRON HCL 4 MG/2ML IJ SOLN: 4.0000 mg | Freq: Four times a day (QID) | INTRAMUSCULAR | Status: DC

## 2018-06-26 MED ORDER — LEVOTHYROXINE SODIUM 50 MCG PO TABS
50.0000 ug | ORAL_TABLET | Freq: Every day | ORAL | Status: DC
  Administered 2018-06-26 – 2018-07-09 (×13): 50 ug
  Filled 2018-06-26 (×13): qty 1

## 2018-06-26 MED ORDER — HEPARIN SODIUM (PORCINE) 1000 UNIT/ML DIALYSIS
1000.0000 [IU] | INTRAMUSCULAR | Status: AC | PRN
  Administered 2018-06-26: 3000 [IU] via INTRAVENOUS_CENTRAL
  Filled 2018-06-26 (×2): qty 6

## 2018-06-26 MED ORDER — ASPIRIN 81 MG PO CHEW
81.0000 mg | CHEWABLE_TABLET | Freq: Every day | ORAL | Status: DC
  Administered 2018-06-27 – 2018-07-03 (×8): 81 mg
  Filled 2018-06-26 (×9): qty 1

## 2018-06-26 MED ORDER — PANTOPRAZOLE SODIUM 40 MG PO PACK
40.0000 mg | PACK | Freq: Every day | ORAL | Status: DC
  Administered 2018-06-26: 40 mg
  Filled 2018-06-26 (×2): qty 20

## 2018-06-26 NOTE — Progress Notes (Signed)
eLink Physician-Brief Progress Note Patient Name: Alexander Whitaker DOB: Mar 26, 1951 MRN: [id number]   Date of Service  06/26/2018  HPI/Events of Note  EKG : NSR - Rate = 89. Minimal voltage criteria for LVH. L Axis Deviation. Marked T wave abnormality, consider anterolateral ischemia. Prolonged QT.  eICU Interventions  Will order: 1. ASA 81 mg per tube now and Q day. 2. Cycle Troponin.     Intervention Category Major Interventions: Other:  Lysle Dingwall 06/26/2018, 10:38 PM

## 2018-06-26 NOTE — Progress Notes (Signed)
NAME:  Alexander Whitaker, MRN:  [id number], DOB:  1950-09-25, LOS: 2 ADMISSION DATE:  06/24/2018, CONSULTATION DATE:  3/29/202 REFERRING MD:  Oval Linsey, CHIEF COMPLAINT:  AMS/resp failure   Brief History   tx from [facility] for AMS/resp failure  History of present illness   68 year old male with history of esrd on hd, cva, htn, chf, presented to [facility] with ams.  He had fever of 101.6 at [place] and only responsive to deep stimuli.  He was intubated.  Cxr showed diffuse infiltrates. Blood sugar of 638.  statted on isulin gtt.   He was given vanc, ceft, azithro.  He was tx today for icu management  Past Medical History  esrd on hd cva  htn chf  Significant Hospital Events   ett 06/23/18 Femoral aline 06/24/18 RIJ 06/24/18  Consults:  Nephrology   Procedures:  ett 06/23/18 Femoral aline 06/24/18 RIJ 06/24/18  Significant Diagnostic Tests:  cxr 06/23/18 with diffuse infiltrates CXR 3/30- bilateral opacities, personally reviewed   Micro Data:  Sputum cx 06/24/18 Blood cx 06/24/18  MRSA PCR 3/29 negative  Antimicrobials:  vanc 06/23/18- 3/31 ceft 06/23/18 azithro 06/23/18   Interim history/subjective:  No events overnight FiO2 down to 40 and PEEP 5  Objective   Blood pressure (!) 186/72, pulse 86, temperature 98.4 F (36.9 C), temperature source Oral, resp. rate 20, height $RemoveBe'5\' 5"'[id number]$  (1.651 m), weight 71.6 kg, SpO2 100 %.    Vent Mode: PRVC FiO2 (%):  [40 %] 40 % Set Rate:  [20 bmp] 20 bmp Vt Set:  [500 mL] 500 mL PEEP:  [5 cmH20] 5 cmH20 Plateau Pressure:  [15 cmH20-20 cmH20] 17 cmH20   Intake/Output Summary (Last 24 hours) at 06/26/2018 0949 Last data filed at 06/26/2018 0800 Gross per 24 hour  Intake 2039.52 ml  Output 4451 ml  Net -2411.48 ml   Filed Weights   06/24/18 0408 06/25/18 0500 06/26/18 0500  Weight: 76.3 kg 73.8 kg 71.6 kg    Examination: General: Acutely ill appearing male, NAD HENT: [place]/AT, PERRL, EOM-I and MMM Lungs: Coarse BS diffusely  Cardiovascular: RRR, Nl S1/S2 and -M/R/G Abdomen: Soft, NT, ND and +BS Extremities: -edema and -tenderness Neuro: Sedate, opens eyes to command, moving everything to pain Skin: clean, dry, warm. Scattered scabbed abrasions on BUE. No rash   Resolved Hospital Problem list     Assessment & Plan:    Acute Respiratory Failure with hypoxia ?CAP- wbc 21.  cxr with diffuse infiltartes.  Fevers.   Pulmonary Edema P Continue azithromyxin/ceftriaone/ vanc  - Follow up tracheal aspirate  - On droplet isolation for r/o covid.    - At risk for COVID-19, testing performed and result pending - Continue pulmonary hygiene  - AM WUA/SBT  - AM CXR  - Begin PS trials to evaluate for extubation   Septic shock- likely secondary to cap Aspect of hypotension related to propofol  P - MAP goal > 65 - Continue norepi, titrate PRN - Hold fentanyl drip for PS trials - Norepi off - D/C Vanc - Rocephin and zithromax to be continued for now, will place stop date at 5 and 7 days - Follow up BCx  - Trend WBC, fever   HONK, likely in setting of septic shock, resolved  DM -no longer on insulin gtt P - Continue SSI  - CBG q4   End-stage renal disease.   -compliant with iHD but given pressor requirement inpatient, is on CRRT  P - Nephrology following, appreciate recs -  Continue CRRT while on pressors  Hypothyroidism - Will continue synthroid   GERD  - Continue Protonix   Attempt PS trials today  Best practice:  Diet: NPO  Pain/Anxiety/Delirium protocol (if indicated): Fentanyl gtt, PRN fent  VAP protocol (if indicated): continue  DVT prophylaxis: SQ Heparin  GI prophylaxis: protonix Glucose control: SSI Mobility: bedrest  Code Status: full code  Family Communication:  None  Disposition: Continue ICU level of care   Labs   CBC: Recent Labs  Lab 06/24/18 0639 06/25/18 0443 06/26/18 0519  WBC 23.4* 18.9* 11.8*  NEUTROABS 19.3*  --   --   HGB 11.3* 10.7* 10.9*  HCT 34.7* 33.3*  34.8*  MCV 90.6 93.3 98.9  PLT 281 208 147*    Basic Metabolic Panel: Recent Labs  Lab 06/24/18 0639 06/25/18 0443 06/25/18 1442 06/26/18 0519  NA 135 137 136 138  K 3.9 3.9 4.4 4.3  CL 99 101 104 103  CO2 21* 21* 24 23  GLUCOSE 213* 143* 152* 131*  BUN 53* 37* 25* 17  CREATININE 5.94* 4.45* 3.43* 2.86*  CALCIUM 7.4* 7.5* 7.5* 7.8*  MG  --  1.9  --  2.3  PHOS  --  2.5 2.8 3.8   GFR: Estimated Creatinine Clearance: 21.2 mL/min (A) (by C-G formula based on SCr of 2.86 mg/dL (H)). Recent Labs  Lab 06/24/18 0639 06/24/18 0650 06/24/18 0803 06/25/18 0443 06/26/18 0519  WBC 23.4*  --   --  18.9* 11.8*  LATICACIDVEN  --  1.9 1.6 1.4  --     Liver Function Tests: Recent Labs  Lab 06/24/18 0639 06/25/18 0443 06/25/18 1442 06/26/18 0519  AST 18  --   --   --   ALT 11  --   --   --   ALKPHOS 83  --   --   --   BILITOT 0.7  --   --   --   PROT 4.9*  --   --   --   ALBUMIN 2.5* 2.5* 2.5* 2.8*   No results for input(s): LIPASE, AMYLASE in the last 168 hours. No results for input(s): AMMONIA in the last 168 hours.  ABG    Component Value Date/Time   PHART 7.356 02/06/2018 0847   PCO2ART 34.6 02/06/2018 0847   PO2ART 124.0 (H) 02/06/2018 0847   HCO3 19.4 (L) 02/06/2018 0847   TCO2 20 (L) 02/06/2018 0847   ACIDBASEDEF 6.0 (H) 02/06/2018 0847   O2SAT 99.0 02/06/2018 0847     Coagulation Profile: No results for input(s): INR, PROTIME in the last 168 hours.  Cardiac Enzymes: No results for input(s): CKTOTAL, CKMB, CKMBINDEX, TROPONINI in the last 168 hours.  HbA1C: Hgb A1c MFr Bld  Date/Time Value Ref Range Status  11/21/2017 07:05 PM 9.1 (H) 4.8 - 5.6 % Final    Comment:    (NOTE) Pre diabetes:          5.7%-6.4% Diabetes:              >6.4% Glycemic control for   <7.0% adults with diabetes   03/11/2016 04:12 AM 6.7 (H) 4.8 - 5.6 % Final    Comment:    (NOTE)         Pre-diabetes: 5.7 - 6.4         Diabetes: >6.4         Glycemic control for adults  with diabetes: <7.0     CBG: Recent Labs  Lab 06/25/18 0914 06/25/18 1233 06/25/18 2324  06/26/18 0406 06/26/18 0823  GLUCAP 122* 123* 134* 128* 137*    The patient is critically ill with multiple organ systems failure and requires high complexity decision making for assessment and support, frequent evaluation and titration of therapies, application of advanced monitoring technologies and extensive interpretation of multiple databases.   Critical Care Time devoted to patient care services described in this note is  33  Minutes. This time reflects time of care of this signee Dr Jennet Maduro. This critical care time does not reflect procedure time, or teaching time or supervisory time of PA/NP/Med student/Med Resident etc but could involve care discussion time.  Rush Farmer, M.D. [facility] Pulmonary/Critical Care Medicine. Pager: [phone]. After hours pager: [phone].

## 2018-06-26 NOTE — Progress Notes (Signed)
Patient vomiting bile and tube feeding orally suctioned and ETT suctioned for bile .Tube feedings stopped and NG connected to suction and 800 mls of tubefeeding was suctioned out. Heart rate also increased to 170 . Dr Nelda Marseille informed. zofran given and 2.5 metoprolol iv given and I was unable to give the remaining 2.5 and amiodarone 150 mg bolus that was ordered due to SBP drop 77 .Levophed restarted

## 2018-06-26 NOTE — Progress Notes (Signed)
[facility] Progress Note  Subjective: on vent. Had narrow complex tachycardia w/ rates 160's this am.    Vitals:   06/26/18 1330 06/26/18 1345 06/26/18 1400 06/26/18 1407  BP:   (!) 119/55 (!) 156/58  Pulse: 61 69 61 74  Resp: _0 (!) 32  Temp: (!) 95.7 F (35.4 C) (!) 96.3 F (35.7 C) (!) 95.7 F (35.4 C)   TempSrc:      SpO2: 100% 100% 100% 100%  Weight:      Height:        Inpatient medications: . chlorhexidine gluconate (MEDLINE KIT)  15 mL Mouth Rinse BID  . Chlorhexidine Gluconate Cloth  6 each Topical Daily  . feeding supplement (PRO-STAT SUGAR FREE 64)  30 mL Per Tube QID  . feeding supplement (VITAL 1.5 CAL)  1,000 mL Per Tube Q24H  . heparin injection (subcutaneous)  5,000 Units Subcutaneous Q8H  . insulin aspart  0-24 Units Subcutaneous Q4H  . levothyroxine  50 mcg Per Tube Q0600  . mouth rinse  15 mL Mouth Rinse 10 times per day  . pantoprazole sodium  40 mg Per Tube Daily  . sodium chloride flush  10-40 mL Intracatheter Q12H   .  prismasol BGK 4/2.5 500 mL/hr at 06/26/18 1303  .  prismasol BGK 4/2.5 300 mL/hr at 06/26/18 0403  . azithromycin Stopped (06/26/18 0626)  . cefTRIAXone (ROCEPHIN)  IV Stopped (06/25/18 2136)  . fentaNYL infusion INTRAVENOUS 125 mcg/hr (06/26/18 1400)  . norepinephrine (LEVOPHED) Adult infusion Stopped (06/26/18 0802)  . prismasol BGK 4/2.5 1,500 mL/hr at 06/26/18 1304   acetaminophen, docusate, fentaNYL, heparin, heparin, midazolam, midazolam, sodium chloride flush  Iron/TIBC/Ferritin/ %Sat    Component Value Date/Time   IRON 46 03/11/2016 0412   TIBC 207 (L) 03/11/2016 0412   FERRITIN 222 03/11/2016 0412   IRONPCTSAT 22 03/11/2016 0412    Exam: General appearance: intubated and sedated Full contact physical exam was not recommended due to concern for covid-19 infection, utilizing the physical exam of CCM Dr Algernon Huxley TTS  4h  76.5kg   2/2.25 bath  Hep none  RUE AVF  400/800  - Micera 200  mcg IV every 2 weeks  - Venofer 43m IV once per week      Assessment/Plan: 1.  HCAP/ severe sepsis syndrome/ fever- was febrile now hypothermic. Is getting IV abx and r/o covid-19 per PCCM.  currently on levophed, weaning down the pressor dose. WBC coming down. Low temps now.  2.  ESRD - started CVVHD on 3/29. Now is off pressors, will dc CRRT, limit IVF's as is possible.  3.  Hypertension/volume  - evidence of perhilar pulmonary infiltrates, possibly edema. CXR has improved some since admission. Net neg 3.0 L since admission. Wt's down  74-76 > 71kg (dry wt 76kg).  4.  Anemia  - stable 5.  Metabolic bone disease -  stable 6.  Nutrition - npo for now 7. AMS- presumably due to pna and sepsis.  Will follow.   [facility] 06/26/2018, 2:37 PM  Recent Labs  Lab 06/25/18 1442 06/26/18 0519  NA 136 138  K 4.4 4.3  CL 104 103  CO2 24 23  GLUCOSE 152* 131*  BUN 25* 17  CREATININE 3.43* 2.86*  CALCIUM 7.5* 7.8*  PHOS 2.8 3.8  ALBUMIN 2.5* 2.8*   Recent Labs  Lab 06/24/18 0639  AST 18  ALT 11  ALKPHOS 83  BILITOT 0.7  PROT 4.9*  Recent Labs  Lab 06/24/18 0639 06/25/18 0443 06/26/18 0519  WBC 23.4* 18.9* 11.8*  NEUTROABS 19.3*  --   --   HGB 11.3* 10.7* 10.9*  HCT 34.7* 33.3* 34.8*  MCV 90.6 93.3 98.9  PLT 281 208 147*

## 2018-06-26 NOTE — Progress Notes (Signed)
eLink Physician-Brief Progress Note Patient Name: Alexander Whitaker DOB: [birth year] MRN: [id number]   Date of Service  06/26/2018  HPI/Events of Note  Frequent PAC's and run of NSVT.  eICU Interventions  Will order: 1. 12 Lead EKG STAT. 2. BMP and Mg++ level STAT.     Intervention Category Major Interventions: Arrhythmia - evaluation and management  [name] 06/26/2018, 9:42 PM

## 2018-06-26 NOTE — Progress Notes (Signed)
Patient with increased SBP 190 and heart rate increased to 180 with wean trial initiated by Dr Nelda Marseille. Returned patient to previous ventilator settings and metoprolol $RemoveBefore'5mg'[id number]$  IV given and Amiodarone bolus 15 mg given per orders. His heart rate decreased to 100 however he then started trying to get OOB and pulled at his femoral arterial line. Fentanyl restarted .CRRT fluid removal decreased  To keep even per Dr Jonnie Finner

## 2018-06-27 ENCOUNTER — Inpatient Hospital Stay (HOSPITAL_COMMUNITY): Payer: Medicare HMO

## 2018-06-27 ENCOUNTER — Encounter (HOSPITAL_COMMUNITY)
Admission: AD | Disposition: A | Discharge status: Home Health | Payer: Self-pay | Source: Other Acute Inpatient Hospital | Attending: Cardiology

## 2018-06-27 ENCOUNTER — Encounter (HOSPITAL_COMMUNITY): Payer: Self-pay | Admitting: Physician Assistant

## 2018-06-27 DIAGNOSIS — I469 Cardiac arrest, cause unspecified: Secondary | ICD-10-CM

## 2018-06-27 DIAGNOSIS — E1122 Type 2 diabetes mellitus with diabetic chronic kidney disease: Secondary | ICD-10-CM

## 2018-06-27 DIAGNOSIS — A419 Sepsis, unspecified organism: Secondary | ICD-10-CM

## 2018-06-27 DIAGNOSIS — I5031 Acute diastolic (congestive) heart failure: Secondary | ICD-10-CM

## 2018-06-27 DIAGNOSIS — I4901 Ventricular fibrillation: Secondary | ICD-10-CM

## 2018-06-27 DIAGNOSIS — Z794 Long term (current) use of insulin: Secondary | ICD-10-CM

## 2018-06-27 DIAGNOSIS — I10 Essential (primary) hypertension: Secondary | ICD-10-CM

## 2018-06-27 DIAGNOSIS — J9621 Acute and chronic respiratory failure with hypoxia: Secondary | ICD-10-CM

## 2018-06-27 DIAGNOSIS — I2510 Atherosclerotic heart disease of native coronary artery without angina pectoris: Secondary | ICD-10-CM

## 2018-06-27 DIAGNOSIS — I214 Non-ST elevation (NSTEMI) myocardial infarction: Secondary | ICD-10-CM

## 2018-06-27 HISTORY — DX: Sepsis, unspecified organism: A41.9

## 2018-06-27 HISTORY — PX: LEFT HEART CATH AND CORONARY ANGIOGRAPHY: CATH118249

## 2018-06-27 LAB — POCT I-STAT 7, (LYTES, BLD GAS, ICA,H+H)
Acid-Base Excess: 1 mmol/L (ref 0.0–2.0)
Acid-base deficit: 9 mmol/L — ABNORMAL HIGH (ref 0.0–2.0)
Acid-base deficit: 4 mmol/L — ABNORMAL HIGH (ref 0.0–2.0)
Bicarbonate: 17 mmol/L — ABNORMAL LOW (ref 20.0–28.0)
Bicarbonate: 20.2 mmol/L (ref 20.0–28.0)
Bicarbonate: 24.1 mmol/L (ref 20.0–28.0)
Calcium, Ion: 1.13 mmol/L — ABNORMAL LOW (ref 1.15–1.40)
Calcium, Ion: 1.11 mmol/L — ABNORMAL LOW (ref 1.15–1.40)
Calcium, Ion: 1.08 mmol/L — ABNORMAL LOW (ref 1.15–1.40)
HCT: 30 % — ABNORMAL LOW (ref 39.0–52.0)
HCT: 28 % — ABNORMAL LOW (ref 39.0–52.0)
HCT: 28 % — ABNORMAL LOW (ref 39.0–52.0)
Hemoglobin: 10.2 g/dL — ABNORMAL LOW (ref 13.0–17.0)
Hemoglobin: 9.5 g/dL — ABNORMAL LOW (ref 13.0–17.0)
Hemoglobin: 9.5 g/dL — ABNORMAL LOW (ref 13.0–17.0)
O2 Saturation: 98 %
O2 Saturation: 97 %
O2 Saturation: 100 %
Patient temperature: 100.8
Patient temperature: 99.6
Patient temperature: 99.4
Potassium: 3.9 mmol/L (ref 3.5–5.1)
Potassium: 3.7 mmol/L (ref 3.5–5.1)
Potassium: 3.6 mmol/L (ref 3.5–5.1)
Sodium: 143 mmol/L (ref 135–145)
Sodium: 142 mmol/L (ref 135–145)
Sodium: 143 mmol/L (ref 135–145)
TCO2: 18 mmol/L — ABNORMAL LOW (ref 22–32)
TCO2: 21 mmol/L — ABNORMAL LOW (ref 22–32)
TCO2: 25 mmol/L (ref 22–32)
pCO2 arterial: 39.6 mmHg (ref 32.0–48.0)
pCO2 arterial: 34.1 mmHg (ref 32.0–48.0)
pCO2 arterial: 34.3 mmHg (ref 32.0–48.0)
pH, Arterial: 7.246 — ABNORMAL LOW (ref 7.350–7.450)
pH, Arterial: 7.384 (ref 7.350–7.450)
pH, Arterial: 7.456 — ABNORMAL HIGH (ref 7.350–7.450)
pO2, Arterial: 131 mmHg — ABNORMAL HIGH (ref 83.0–108.0)
pO2, Arterial: 91 mmHg (ref 83.0–108.0)
pO2, Arterial: 495 mmHg — ABNORMAL HIGH (ref 83.0–108.0)

## 2018-06-27 LAB — CULTURE, RESPIRATORY W GRAM STAIN

## 2018-06-27 LAB — BASIC METABOLIC PANEL
Anion gap: 21 — ABNORMAL HIGH (ref 5–15)
BUN: 26 mg/dL — ABNORMAL HIGH (ref 8–23)
CO2: 15 mmol/L — ABNORMAL LOW (ref 22–32)
Calcium: 8.1 mg/dL — ABNORMAL LOW (ref 8.9–10.3)
Chloride: 103 mmol/L (ref 98–111)
Creatinine, Ser: 4.26 mg/dL — ABNORMAL HIGH (ref 0.61–1.24)
GFR calc Af Amer: 15 mL/min — ABNORMAL LOW (ref >60)
GFR calc non Af Amer: 13 mL/min — ABNORMAL LOW (ref >60)
Glucose, Bld: 157 mg/dL — ABNORMAL HIGH (ref 70–99)
POTASSIUM: 4.1 mmol/L (ref 3.5–5.1)
Sodium: 139 mmol/L (ref 135–145)

## 2018-06-27 LAB — CBC
HEMATOCRIT: 36.7 % — AB (ref 39.0–52.0)
Hemoglobin: 10.9 g/dL — ABNORMAL LOW (ref 13.0–17.0)
MCH: 29.9 pg (ref 26.0–34.0)
MCHC: 29.7 g/dL — ABNORMAL LOW (ref 30.0–36.0)
MCV: 100.5 fL — ABNORMAL HIGH (ref 80.0–100.0)
Platelets: 180 10*3/uL (ref 150–400)
RBC: 3.65 MIL/uL — ABNORMAL LOW (ref 4.22–5.81)
RDW: 15.1 % (ref 11.5–15.5)
WBC: 13.9 10*3/uL — ABNORMAL HIGH (ref 4.0–10.5)
nRBC: 0 % (ref 0.0–0.2)

## 2018-06-27 LAB — GLUCOSE, CAPILLARY
Glucose-Capillary: 100 mg/dL — ABNORMAL HIGH (ref 70–99)
Glucose-Capillary: 128 mg/dL — ABNORMAL HIGH (ref 70–99)
Glucose-Capillary: 136 mg/dL — ABNORMAL HIGH (ref 70–99)
Glucose-Capillary: 149 mg/dL — ABNORMAL HIGH (ref 70–99)
Glucose-Capillary: 155 mg/dL — ABNORMAL HIGH (ref 70–99)
Glucose-Capillary: 85 mg/dL (ref 70–99)

## 2018-06-27 LAB — TROPONIN I
Troponin I: 2.65 ng/mL
Troponin I: 2.75 ng/mL (ref <0.03)

## 2018-06-27 LAB — HEPARIN LEVEL (UNFRACTIONATED): Heparin Unfractionated: 0.18 [IU]/mL — ABNORMAL LOW (ref 0.30–0.70)

## 2018-06-27 LAB — TRIGLYCERIDES: Triglycerides: 199 mg/dL — ABNORMAL HIGH (ref <150)

## 2018-06-27 LAB — MAGNESIUM: Magnesium: 2.6 mg/dL — ABNORMAL HIGH (ref 1.7–2.4)

## 2018-06-27 LAB — PHOSPHORUS: Phosphorus: 3.9 mg/dL (ref 2.5–4.6)

## 2018-06-27 SURGERY — LEFT HEART CATH AND CORONARY ANGIOGRAPHY
Anesthesia: LOCAL

## 2018-06-27 MED ORDER — FENTANYL CITRATE (PF) 100 MCG/2ML IJ SOLN
INTRAMUSCULAR | Status: AC
  Filled 2018-06-27: qty 2

## 2018-06-27 MED ORDER — HEPARIN SODIUM (PORCINE) 5000 UNIT/ML IJ SOLN
5000.0000 [IU] | Freq: Three times a day (TID) | INTRAMUSCULAR | Status: DC
  Administered 2018-06-27 – 2018-07-09 (×33): 5000 [IU] via SUBCUTANEOUS
  Filled 2018-06-27 (×34): qty 1

## 2018-06-27 MED ORDER — HEPARIN (PORCINE) IN NACL 1000-0.9 UT/500ML-% IV SOLN
INTRAVENOUS | Status: DC | PRN
  Administered 2018-06-27 (×2): 500 mL

## 2018-06-27 MED ORDER — IOHEXOL 350 MG/ML SOLN
INTRAVENOUS | Status: DC | PRN
  Administered 2018-06-27: 17:00:00 70 mL via INTRAVENOUS

## 2018-06-27 MED ORDER — AMIODARONE HCL IN DEXTROSE 360-4.14 MG/200ML-% IV SOLN
30.0000 mg/h | INTRAVENOUS | Status: DC
  Administered 2018-06-27 – 2018-06-28 (×3): 30 mg/h via INTRAVENOUS
  Filled 2018-06-27 (×3): qty 200

## 2018-06-27 MED ORDER — MIDAZOLAM HCL 2 MG/2ML IJ SOLN
INTRAMUSCULAR | Status: DC | PRN
  Administered 2018-06-27 (×2): 1 mg via INTRAVENOUS

## 2018-06-27 MED ORDER — MIDAZOLAM HCL 2 MG/2ML IJ SOLN
INTRAMUSCULAR | Status: AC
  Filled 2018-06-27: qty 2

## 2018-06-27 MED ORDER — HEPARIN BOLUS VIA INFUSION
2000.0000 [IU] | Freq: Once | INTRAVENOUS | Status: AC
  Administered 2018-06-27: 2000 [IU] via INTRAVENOUS
  Filled 2018-06-27: qty 2000

## 2018-06-27 MED ORDER — AMIODARONE HCL IN DEXTROSE 360-4.14 MG/200ML-% IV SOLN
60.0000 mg/h | INTRAVENOUS | Status: AC
  Administered 2018-06-27 (×2): 60 mg/h via INTRAVENOUS
  Filled 2018-06-27: qty 200

## 2018-06-27 MED ORDER — SODIUM CHLORIDE 0.9% FLUSH
3.0000 mL | INTRAVENOUS | Status: DC | PRN
  Administered 2018-06-30: 3 mL via INTRAVENOUS
  Filled 2018-06-27 (×2): qty 3

## 2018-06-27 MED ORDER — CLONAZEPAM 1 MG PO TABS
1.0000 mg | ORAL_TABLET | Freq: Three times a day (TID) | ORAL | Status: DC
  Administered 2018-06-27 – 2018-06-29 (×5): 1 mg
  Filled 2018-06-27 (×5): qty 1

## 2018-06-27 MED ORDER — SODIUM CHLORIDE 0.9 % IV SOLN
INTRAVENOUS | Status: DC
  Administered 2018-06-27: 14:00:00 via INTRAVENOUS

## 2018-06-27 MED ORDER — SODIUM CHLORIDE 0.9 % IV SOLN
500.0000 mg | INTRAVENOUS | Status: DC
  Filled 2018-06-27 (×2): qty 500

## 2018-06-27 MED ORDER — ASPIRIN 81 MG PO CHEW
324.0000 mg | CHEWABLE_TABLET | Freq: Once | ORAL | Status: AC
  Administered 2018-06-27: 324 mg via ORAL
  Filled 2018-06-27: qty 4

## 2018-06-27 MED ORDER — SODIUM CHLORIDE 0.9 % IV SOLN
250.0000 mL | INTRAVENOUS | Status: DC | PRN
  Administered 2018-06-30 – 2018-07-04 (×2): 250 mL via INTRAVENOUS

## 2018-06-27 MED ORDER — LIDOCAINE HCL (PF) 1 % IJ SOLN
INTRAMUSCULAR | Status: AC
  Filled 2018-06-27: qty 30

## 2018-06-27 MED ORDER — LIDOCAINE HCL (PF) 1 % IJ SOLN
INTRAMUSCULAR | Status: DC | PRN
  Administered 2018-06-27: 15 mL via INTRADERMAL

## 2018-06-27 MED ORDER — SODIUM BICARBONATE 8.4 % IV SOLN
100.0000 meq | Freq: Once | INTRAVENOUS | Status: AC
  Administered 2018-06-27: 100 meq via INTRAVENOUS
  Filled 2018-06-27: qty 50

## 2018-06-27 MED ORDER — AMIODARONE LOAD VIA INFUSION
150.0000 mg | Freq: Once | INTRAVENOUS | Status: DC
  Filled 2018-06-27: qty 83.34

## 2018-06-27 MED ORDER — PANTOPRAZOLE SODIUM 40 MG IV SOLR
40.0000 mg | INTRAVENOUS | Status: DC
  Administered 2018-06-27 – 2018-06-28 (×2): 40 mg via INTRAVENOUS
  Filled 2018-06-27 (×2): qty 40

## 2018-06-27 MED ORDER — AMIODARONE LOAD VIA INFUSION
150.0000 mg | Freq: Once | INTRAVENOUS | Status: AC
  Administered 2018-06-27: 150 mg via INTRAVENOUS
  Filled 2018-06-27: qty 83.34

## 2018-06-27 MED ORDER — PHENYLEPHRINE HCL-NACL 10-0.9 MG/250ML-% IV SOLN
0.0000 ug/min | INTRAVENOUS | Status: DC
  Administered 2018-06-27: 11:00:00 60 ug/min via INTRAVENOUS
  Administered 2018-06-27: 23:00:00 65 ug/min via INTRAVENOUS
  Administered 2018-06-27: 10:00:00 80 ug/min via INTRAVENOUS
  Administered 2018-06-27: 30 ug/min via INTRAVENOUS
  Administered 2018-06-28: 60 ug/min via INTRAVENOUS
  Administered 2018-06-28: 55 ug/min via INTRAVENOUS
  Administered 2018-06-28: 25 ug/min via INTRAVENOUS
  Filled 2018-06-27 (×7): qty 250

## 2018-06-27 MED ORDER — SODIUM CHLORIDE 0.9% FLUSH
3.0000 mL | Freq: Two times a day (BID) | INTRAVENOUS | Status: DC
  Administered 2018-06-27 – 2018-07-06 (×18): 3 mL via INTRAVENOUS

## 2018-06-27 MED ORDER — HEPARIN (PORCINE) 25000 UT/250ML-% IV SOLN
1100.0000 [IU]/h | INTRAVENOUS | Status: DC
  Administered 2018-06-27: 1100 [IU]/h via INTRAVENOUS
  Administered 2018-06-27: 850 [IU]/h via INTRAVENOUS
  Filled 2018-06-27: qty 250

## 2018-06-27 MED ORDER — HEPARIN (PORCINE) IN NACL 1000-0.9 UT/500ML-% IV SOLN
INTRAVENOUS | Status: AC
  Filled 2018-06-27: qty 500

## 2018-06-27 MED ORDER — METOPROLOL TARTRATE 5 MG/5ML IV SOLN
INTRAVENOUS | Status: AC
  Filled 2018-06-27: qty 5

## 2018-06-27 MED ORDER — AMIODARONE HCL IN DEXTROSE 360-4.14 MG/200ML-% IV SOLN
60.0000 mg/h | INTRAVENOUS | Status: DC
  Filled 2018-06-27: qty 200

## 2018-06-27 MED ORDER — AMIODARONE HCL IN DEXTROSE 360-4.14 MG/200ML-% IV SOLN
30.0000 mg/h | INTRAVENOUS | Status: DC
  Filled 2018-06-27: qty 200

## 2018-06-27 SURGICAL SUPPLY — 11 items
CATH INFINITI 5FR MULTPACK ANG (CATHETERS) ×2 IMPLANT
COVER DOME SNAP 22 D (MISCELLANEOUS) ×2 IMPLANT
ELECT DEFIB PAD ADLT CADENCE (PAD) ×2 IMPLANT
HOVERMATT SINGLE USE (MISCELLANEOUS) ×2 IMPLANT
KIT HEART LEFT (KITS) ×2 IMPLANT
PACK CARDIAC CATHETERIZATION (CUSTOM PROCEDURE TRAY) ×2 IMPLANT
SHEATH PINNACLE 5F 10CM (SHEATH) ×2 IMPLANT
SYR MEDRAD MARK 7 150ML (SYRINGE) ×2 IMPLANT
TRANSDUCER W/STOPCOCK (MISCELLANEOUS) ×2 IMPLANT
TUBING CIL FLEX 10 FLL-RA (TUBING) ×2 IMPLANT
WIRE EMERALD 3MM-J .035X150CM (WIRE) ×2 IMPLANT

## 2018-06-27 NOTE — Progress Notes (Signed)
[place] Progress Note Patient Name: Alexander Whitaker DOB: 12-02-1950 MRN: [id number]   Date of Service  06/27/2018  HPI/Events of Note  ABG on 30%/PRVC 20/TV 500/P 5 = 7.246/39.6/131.0  eICU Interventions  Will order: 1. Increase PRVC rate to 24. 2. NaHCO3 100 meq IV now.  3. ABG at 8 AM.      Intervention Category Major Interventions: Acid-Base disturbance - evaluation and management;Respiratory failure - evaluation and management  [name],[name] 06/27/2018, 4:04 AM

## 2018-06-27 NOTE — Progress Notes (Addendum)
Patient with VT arrest CPR and defib with return of pulse see code sheet for details at 0929. I called and talked to Patient's wife afterward as well as [facility]. Dr Jonnie Finner also attempted to call her but she did not answer. I provided update and emotional support.

## 2018-06-27 NOTE — H&P (View-Only) (Signed)
Cardiology Consultation:   Patient ID: AMROM ORE; [id number]; 1950-10-05   Admit date: 06/24/2018 Date of Consult: 06/27/2018  Primary Care Provider: [name], MD Primary Cardiologist: Glenetta Hew, MD New Primary Electrophysiologist:  None   Patient Profile:   Alexander Whitaker is a 68 y.o. male with a hx of DM2, HTN, ESRD on HD, GERD, hypothyroid, D-CHF, CVA, who is being seen today for the evaluation of cardiac arrest at the request of Dr. Nelda Marseille and Woody Seller Minor, NP.  History of Present Illness:   Mr. Modica was admitted to [facility] w/ fever, diffuse infiltrates on CXR, decreased LOC, CBG 638. Intubated, on ABX and insulin gtt. --> COVID-19 screen has recently been returned as negative per Dr. Edison Nasuti cube.  Tx to [facility] 03/29. HD per Renal.  03/31, SBP 190 and HR 180 during wean trial. Prev settings renewed, pt given Amio 150 mg and metoprolol 5 mg IV. HR improved. HR increased again w/ vomiting, metoprolol 2.5 mg IV but BP dropped and Levo restarted.  03/31, After HR slowed, ECG ordered and ?ischemia>>ASA 81 mg qd and troponins ordered>>elevated w/ peak 2.75.   04/01, 3 am, pt went into Afib, RVR, Amio IV started. Ph 7.246 on ABG>>bicarb  04/01 after 10 am, pt became tachycardic w/ no stimulus, possible torsades vs VT>>possible coarse VF. Lost pulses, got compressions for a brief time and defib x 1, ROSC. Never got Epi. Cards asked to see.   Pt is intubated and sedated, though agitated at times on the vent.   Information is obtained from staff, chart notes and paper chart from [place].--Per Dr. Fabio Bering, COVID-19 screen did return is negative although this is not obviously visible in the computer char--likely because it was done at an outside hospital) -ICU staff has discontinued precautions.  Unable to assess symptomatology because of his intubation and agitation.  Per nursing report, he has very labile blood pressures ranging anywhere from systolics in the  15A to as high as a 210s.  Currently on Neo-Synephrine because of post CPR hypotension.  Now hypertensive with blood pressures over 569 systolic.  Heart rate is ranging anywhere from 80s to 110 with agitation.   Past Medical History:  Diagnosis Date  . Anemia   . Arthritis    per pt (11/21/2017)  . CHF (congestive heart failure) ([place]) ~ 2017  . CKD stage 3 due to type 2 diabetes mellitus ([place])    stage 5 Dialysis T/Th/Sa  . CVA (cerebral vascular accident) [facility]) 2007   wife denies residual on 11/21/2017  . Diabetic peripheral neuropathy ([place])   . Dyspnea   . ESRD (end stage renal disease) on dialysis ([place])    "[place]; TTS" (11/21/2017)  . GERD (gastroesophageal reflux disease)    has in the past  . Headache    since going on dialysis  . Hypertension   . Hypothyroidism   . PONV (postoperative nausea and vomiting)   . Restless legs   . Type II diabetes mellitus ([place])     Past Surgical History:  Procedure Laterality Date  . AV FISTULA PLACEMENT Right 10/07/2016   Procedure: RIGHT BRACHIOCEPHALIC ARTERIOVENOUS (AV) FISTULA CREATION;  Surgeon: Angelia Mould, MD;  Location: [facility];  Service: Vascular;  Laterality: Right;  . BACK SURGERY    . CATARACT EXTRACTION W/ INTRAOCULAR LENS IMPLANT Left   . CIRCUMCISION  2015  . HERNIA REPAIR  2016   "stomach"  . POSTERIOR LUMBAR FUSION  1990s?   "has  bolts and rods in there"     Prior to Admission medications   Medication Sig Start Date End Date Taking? Authorizing Provider  albuterol (PROVENTIL HFA;VENTOLIN HFA) 108 (90 Base) MCG/ACT inhaler Inhale 2 puffs into the lungs every 6 (six) hours as needed for wheezing or shortness of breath.   Yes [provider]  amLODipine (NORVASC) 10 MG tablet Take 1 tablet (10 mg total) by mouth daily. 11/25/17  Yes Patrecia Pour, Christean Grief, MD  camphor-menthol [facility]) lotion Apply 1 application topically as needed for itching.   Yes [provider]  clonazePAM (KLONOPIN) 2 MG tablet  Take 2 mg by mouth 3 (three) times daily as needed for anxiety.   Yes [provider]  diclofenac sodium (VOLTAREN) 1 % GEL Apply 2 g topically 3 (three) times daily as needed for pain. knees 05/29/18  Yes [provider]  escitalopram (LEXAPRO) 10 MG tablet Take 10 mg by mouth daily. 06/07/18  Yes [provider]  famotidine (PEPCID) 40 MG tablet Take 40 mg by mouth daily.   Yes [provider]  gabapentin (NEURONTIN) 300 MG capsule Take 300 mg by mouth 2 (two) times daily.   Yes [provider]  hydrOXYzine (ATARAX/VISTARIL) 25 MG tablet Take 25 mg by mouth 2 (two) times daily as needed for itching.    Yes [provider]  Insulin Detemir (LEVEMIR) 100 UNIT/ML Pen Inject 10 Units into the skin daily at 12 noon. Patient taking differently: Inject 50 Units into the skin daily at 12 noon.  10/07/16  Yes Alvia Grove, PA-C  levothyroxine (SYNTHROID, LEVOTHROID) 50 MCG tablet Take 50 mcg by mouth daily before breakfast.  09/19/16  Yes [provider]  lidocaine-prilocaine (EMLA) cream Apply 1 application topically See admin instructions. Apply small amount to access site 1 to 2 hours before dialysis. 11/13/17  Yes [provider]  loratadine (CLARITIN) 10 MG tablet Take 10 mg by mouth daily as needed for rhinitis.    Yes [provider]  Melatonin 10 MG CAPS Take 10 mg by mouth at bedtime as needed (sleep).    Yes [provider]  ondansetron (ZOFRAN) 4 MG tablet Take 4 mg by mouth 4 (four) times daily as needed for nausea/vomiting. 06/17/18  Yes [provider]  pantoprazole (PROTONIX) 40 MG tablet Take 40 mg by mouth daily.   Yes [provider]  pramipexole (MIRAPEX) 0.5 MG tablet Take 0.5 mg by mouth at bedtime as needed (sleep).   Yes [provider]    Inpatient Medications: Scheduled Meds: . aspirin  81 mg Per Tube Daily  . chlorhexidine gluconate (MEDLINE KIT)  15 mL Mouth Rinse  BID  . Chlorhexidine Gluconate Cloth  6 each Topical Daily  . clonazePAM  1 mg Per Tube Q8H  . feeding supplement (PRO-STAT SUGAR FREE 64)  30 mL Per Tube QID  . feeding supplement (VITAL 1.5 CAL)  1,000 mL Per Tube Q24H  . insulin aspart  0-24 Units Subcutaneous Q4H  . levothyroxine  50 mcg Per Tube Q0600  . mouth rinse  15 mL Mouth Rinse 10 times per day  . metoprolol tartrate      . pantoprazole (PROTONIX) IV  40 mg Intravenous Q24H  . sodium chloride flush  10-40 mL Intracatheter Q12H   Continuous Infusions: . amiodarone 60 mg/hr (06/27/18 1006)   Followed by  . amiodarone 30 mg/hr (06/27/18 1152)  . [START ON 06/28/2018] azithromycin    . cefTRIAXone (ROCEPHIN)  IV  Stopped (06/26/18 2228)  . fentaNYL infusion INTRAVENOUS Stopped (06/27/18 0809)  . heparin 1,100 Units/hr (06/27/18 1100)  . phenylephrine (NEO-SYNEPHRINE) Adult infusion 60 mcg/min (06/27/18 1115)   PRN Meds: acetaminophen, docusate, fentaNYL, midazolam, midazolam, ondansetron (ZOFRAN) IV, sodium chloride flush  Allergies:   No Known Allergies  Social History:  No additional info obtainable due to patient condition.  Social History   Socioeconomic History  . Marital status: Married    Spouse name: Not on file  . Number of children: Not on file  . Years of education: Not on file  . Highest education level: Not on file  Occupational History  . Occupation: retired  Scientific laboratory technician  . Financial resource strain: Not on file  . Food insecurity:    Worry: Not on file    Inability: Not on file  . Transportation needs:    Medical: Not on file    Non-medical: Not on file  Tobacco Use  . Smoking status: Former Smoker    Packs/day: 1.00    Years: 57.00    Pack years: 57.00    Types: Cigarettes    Last attempt to quit: 03/28/2017    Years since quitting: 1.2  . Smokeless tobacco: Never Used  Substance and Sexual Activity  . Alcohol use: Not Currently  . Drug use: Never  . Sexual activity: Not Currently   Lifestyle  . Physical activity:    Days per week: Not on file    Minutes per session: Not on file  . Stress: Not on file  Relationships  . Social connections:    Talks on phone: Not on file    Gets together: Not on file    Attends religious service: Not on file    Active member of club or organization: Not on file    Attends meetings of clubs or organizations: Not on file    Relationship status: Not on file  . Intimate partner violence:    Fear of current or ex partner: Not on file    Emotionally abused: Not on file    Physically abused: Not on file    Forced sexual activity: Not on file  Other Topics Concern  . Not on file  Social History Narrative  . Not on file    Family History:   Family History  Problem Relation Age of Onset  . Leukemia Mother 77  . CVA Father 31   Family Status:  Family Status  Relation Name Status  . Mother  Deceased  . Father  Deceased    ROS:  Please see the history of present illness.  Not obtainable -patient is intubated and sedated.  Physical Exam/Data:   Vitals:   06/27/18 1015 06/27/18 1030 06/27/18 1045 06/27/18 1100  BP:    (!) 139/57  Pulse: 61 (!) 59 (!) 58 63  Resp: (!) 24 (!) 24 (!) 24 (!) 24  Temp:      TempSrc:      SpO2: 100% 100% 100% 100%  Weight:      Height:        Intake/Output Summary (Last 24 hours) at 06/27/2018 1227 Last data filed at 06/27/2018 1100 Gross per 24 hour  Intake 1387.93 ml  Output 1338 ml  Net 49.93 ml   Filed Weights   06/25/18 0500 06/26/18 0500 06/27/18 0445  Weight: 73.8 kg 71.6 kg 69.3 kg   Body mass index is 25.42 kg/m.  General: Intubated, currently not sedated very agitated. HEENT: normal; ET tube and OG  tube in place Neck: Not able to assess JVD or bruit because of moving. Endocrine:  No thryomegaly Vascular: No carotid bruits; 4/4 extremity pulses 2+, without bruits  Cardiac: RRR with no obvious M/R/G.  Difficult to assess because of moving. Lungs: Mild diffuse coarse breath  sounds, but no obvious rales or rhonchi. Abd: soft, nontender, no hepatomegaly  Ext: no edema Musculoskeletal:  No deformities, BUE and BLE strength normal and equal Skin: warm and dry  Neuro/Psych: Very agitated with sedation being weaned postcode   EKG:  The EKG was personally reviewed and demonstrates:  03/31, SR, HR 79, deep anterolateral T wave inversions Current EKGs are somewhat confusing as are 2 separate EKGs due to patient moving.  1 does not have a lead V3.  Much less prominent anterolateral T wave inversions with subtle biphasic ST and T wave in changes and T wave inversions in what looks like V2 through V4.  Telemetry:  Telemetry was personally reviewed and demonstrates:  SR, ?torsades episodes yesterday, and again today w/ several episodes of short VT followed by deterioration into VF at roughly 1030 this morning--CPR-->>shock>>SR. --While I was present post code he did have one brief burst of VT as well.  Relevant CV Studies:  ECHO: 11/22/2017 - Left ventricle: The cavity size was normal. Systolic function was   normal. The estimated ejection fraction was in the range of 60%   to 65%. Wall motion was normal; there were no regional wall   motion abnormalities. There was an increased relative   contribution of atrial contraction to ventricular filling.   Doppler parameters are consistent with abnormal left ventricular   relaxation (grade 1 diastolic dysfunction). Doppler parameters   are consistent with high ventricular filling pressure. - Aortic valve: Trileaflet; normal thickness, mildly calcified   leaflets. - Tricuspid valve: There was trivial regurgitation. - Pulmonic valve: There was trivial regurgitation. - Pulmonary arteries: PA peak pressure: 35 mm Hg (S).   Laboratory Data:  Chemistry Recent Labs  Lab 06/26/18 0519 06/26/18 2153  06/27/18 0437 06/27/18 0738 06/27/18 1102  NA 138 136   < > 139 142 143  K 4.3 3.9   < > 4.1 3.7 3.6  CL 103 105  --  103   --   --   CO2 23 19*  --  15*  --   --   GLUCOSE 131* 109*  --  157*  --   --   BUN 17 23  --  26*  --   --   CREATININE 2.86* 3.20*  --  4.26*  --   --   CALCIUM 7.8* 7.8*  --  8.1*  --   --   GFRNONAA 21* 19*  --  13*  --   --   GFRAA 25* 22*  --  15*  --   --   ANIONGAP 12 12  --  21*  --   --    < > = values in this interval not displayed.    Lab Results  Component Value Date   ALT 11 06/24/2018   AST 18 06/24/2018   ALKPHOS 83 06/24/2018   BILITOT 0.7 06/24/2018   Hematology Recent Labs  Lab 06/25/18 0443 06/26/18 0519  06/27/18 0437 06/27/18 0738 06/27/18 1102  WBC 18.9* 11.8*  --  13.9*  --   --   RBC 3.57* 3.52*  --  3.65*  --   --   HGB 10.7* 10.9*   < > 10.9* 9.5* 9.5*  HCT 33.3* 34.8*   < > 36.7* 28.0* 28.0*  MCV 93.3 98.9  --  100.5*  --   --   MCH 30.0 31.0  --  29.9  --   --   MCHC 32.1 31.3  --  29.7*  --   --   RDW 15.0 15.4  --  15.1  --   --   PLT 208 147*  --  180  --   --    < > = values in this interval not displayed.   Cardiac Enzymes Recent Labs  Lab 06/26/18 2153 06/27/18 0437 06/27/18 1012  TROPONINI 2.05* 2.65* 2.75*   No results for input(s): TROPIPOC in the last 168 hours.  BNPNo results for input(s): BNP, PROBNP in the last 168 hours.  DDimer No results for input(s): DDIMER in the last 168 hours. TSH:  Lab Results  Component Value Date   TSH 2.533 11/21/2017   Lipids: Lab Results  Component Value Date   CHOL 183 11/22/2017   HDL 27 (L) 11/22/2017   LDLCALC 99 11/22/2017   TRIG 199 (H) 06/27/2018   CHOLHDL 6.8 11/22/2017   HgbA1c: Lab Results  Component Value Date   HGBA1C 9.1 (H) 11/21/2017   Magnesium:  Magnesium  Date Value Ref Range Status  06/27/2018 2.6 (H) 1.7 - 2.4 mg/dL Final    Comment:    Performed at [facility], [place] [address], [place], [place] [address]     Radiology/Studies:  Dg Abd 1 View  Result Date: 06/27/2018 CLINICAL DATA:  Respiratory failure, abdominal distension EXAM: ABDOMEN - 1  VIEW COMPARISON:  None. FINDINGS: Endotracheal tube with tip in the gastric body. Side port just below the GE junction. No dilated to large or small bowel. Posterior lumbar fusion. IMPRESSION: 1. No evidence of bowel obstruction. 2. NG tube in stomach. Electronically Signed   By: Suzy Bouchard M.D.   On: 06/27/2018 07:37   Dg Abd 1 View  Result Date: 06/24/2018 CLINICAL DATA:  Orogastric tube placement. EXAM: ABDOMEN - 1 VIEW COMPARISON:  None. FINDINGS: The bowel gas pattern is normal. Distal tip of nasogastric tube appears to be in distal esophagus. No radio-opaque calculi or other significant radiographic abnormality are seen. IMPRESSION: Distal tip of nasogastric tube appears to be in distal esophagus; advancement is recommended. No evidence of bowel obstruction or ileus. Electronically Signed   By: Marijo Conception, M.D.   On: 06/24/2018 08:54   Dg Chest Port 1 View  Result Date: 06/27/2018 CLINICAL DATA:  Respiratory failure EXAM: PORTABLE CHEST 1 VIEW COMPARISON:  06/26/2018 FINDINGS: Support devices are stable. Mild cardiomegaly and vascular congestion. Slight right base atelectasis, improving since prior study. No confluent opacity on the left. No effusions or acute bony abnormality. IMPRESSION: Mild cardiomegaly.  Improving right base atelectasis. Electronically Signed   By: Rolm Baptise M.D.   On: 06/27/2018 07:33   Dg Chest Port 1 View  Result Date: 06/26/2018 CLINICAL DATA:  Respiratory failure EXAM: PORTABLE CHEST 1 VIEW COMPARISON:  06/25/2018 FINDINGS: Endotracheal tube, gastric catheter and right jugular central line are again seen and stable. No pneumothorax is noted. Mild central vascular congestion is noted. Some patchy opacity is noted in the right lung base likely representing developing atelectasis. This is somewhat accentuated by low overlying extrinsic artifact. No acute bony abnormality is noted. IMPRESSION: Slight increase in right basilar atelectatic changes. Tubes and lines  as described. Electronically Signed   By: Linus Mako.D.  On: 06/26/2018 07:42   Dg Chest Port 1 View  Result Date: 06/25/2018 CLINICAL DATA:  68 year old male with acute respiratory failure EXAM: PORTABLE CHEST 1 VIEW COMPARISON:  Prior chest x-ray 06/24/2018 FINDINGS: The patient remains intubated. The tip of the endotracheal tube is 5.5 cm above the carina. A right IJ non tunneled hemodialysis catheter remains in stable position with the tip overlying the upper SVC. Stable enlargement of the cardiopericardial silhouette. Perihilar and bibasilar interstitial and airspace opacities are present along with pulmonary vascular congestion most consistent with pulmonary edema. Similar appearance of multifocal right-sided rib fractures. Atherosclerotic calcifications again noted in the transverse aorta. The upper abdominal bowel gas pattern is unremarkable. IMPRESSION: 1. The tip of the endotracheal tube is 5.5 cm above the carina. 2. The tip of the right IJ non tunneled hemodialysis catheter overlies the upper SVC. 3. Stable cardiomegaly and mild pulmonary edema. Electronically Signed   By: Jacqulynn Cadet M.D.   On: 06/25/2018 07:45   Dg Chest Port 1 View  Result Date: 06/24/2018 CLINICAL DATA:  Evaluate central line placement EXAM: PORTABLE CHEST 1 VIEW COMPARISON:  Earlier same day FINDINGS: Endotracheal tube tip is 4 cm above the carina. New right internal jugular central line tip is in the SVC at the azygos level. Patchy bilateral pulmonary infiltrates persist. No other new finding. IMPRESSION: New right internal jugular central line tip in the SVC at the azygos level. Electronically Signed   By: Nelson Chimes M.D.   On: 06/24/2018 18:40   Dg Chest Port 1 View  Result Date: 06/24/2018 CLINICAL DATA:  Respiratory distress. EXAM: PORTABLE CHEST 1 VIEW COMPARISON:  Radiograph of June 23, 2018. FINDINGS: Stable cardiomediastinal silhouette. Endotracheal and nasogastric tubes are unchanged in position.  Interval placement of right internal jugular catheter with distal tip looped in superior vena cava. No pneumothorax or pleural effusion is noted. Stable mild left lingular opacity is noted as well as right perihilar opacity, concerning for edema or pneumonia. Bony thorax is unremarkable. IMPRESSION: Interval placement of right internal jugular catheter with distal tip looped within superior vena cava. No pneumothorax is noted. Stable bilateral lung opacities are noted concerning for pneumonia or possibly edema. Electronically Signed   By: Marijo Conception, M.D.   On: 06/24/2018 08:52   Dg Abd Portable 1v  Result Date: 06/25/2018 CLINICAL DATA:  NG tube placement. EXAM: PORTABLE ABDOMEN - 1 VIEW COMPARISON:  None. FINDINGS: NG tube tip lies along the greater curvature of the stomach. Gas pattern is nonspecific. Lumbar spine hardware is stable. IMPRESSION: NG tube tip lies along the greater curvature of the stomach. Electronically Signed   By: Staci Righter M.D.   On: 06/25/2018 19:09    Assessment and Plan:   1. Cardiac arrest: The episode today looks less consistent with polymorphic VT/torsades than simple VT degenerating into VF.  Continue amiodarone for bursts of what it had been significantly tachycardic SVT and now VT.  Given the grossly abnormal EKG yesterday and still present anterior ST of T wave changes, need to exclude anterior wall ischemia as the cause versus stress-induced cardiomyopathy/Takotsubo.  This is also in the setting of having elevated troponin levels.3  Have discussed with Cath Lab staff.  Plan will be to do urgent cardiac catheterization today, however this will be done with COVID-19 precautions as there is no clear documentation of a negative test result, and he is coming from 2 AM ICU with multiple COVID-19 with outpatients.  Would asked that he be back  on full sedation (per nursing report was very responsive to Versed)  2. NSTEMI -versus demand ischemia with severe  tachycardia in the setting of potential CAD.  Did have a notable bump in troponins today.- higher than expected from tachycardia  deep T wave inversions are new, repeat ECG today showed less prominent T wave inversions --> however in light of elevated troponin, now VF arrest with notable EKG changes consistent with possible anterior wall ischemia versus stress-induced cardiomyopathy, I think the best course of action would be proceed with cardiac catheterization.  As he does seem to be improving overall from medical medical standpoint, this would be consistent with a negative COVID-19 screen.  At this point I do think that it is relatively prudent to proceed with ischemic evaluation in light of recurrent VT.  We will need to have heparin stopped on call to Cath Lab  Would proceed with checking 2D echocardiogram  He does have dialysis planned for tomorrow and therefore cath today would be prudent.  Pending results of cardiac catheterization, will need to address issues such as hyperlipidemia etc.  Labile blood pressures: Apparently did not tolerate beta-blocker because of hypotension, currently now actually on pressors postarrest.  We will try to wean off of Neo-Synephrine.  Hold off on beta-blocker for now, continue amiodarone.   Active Problems:   Cardiac arrest with ventricular fibrillation (HCC)   Non-ST elevation (NSTEMI) myocardial infarction (HCC)   Acute diastolic congestive heart failure (HCC)   Diabetes mellitus, type 2 (HCC)   ESRD (end stage renal disease) ([place])   Essential hypertension   Respiratory failure with hypoxia ([place])   Sepsis ([place])   Note initially started by Rosaria Ferries, PA-C.  Note finalized by the undersigned who performed the in-hospital component of patient evaluation, telemetry and EKG review as well as assessment and plan.  For questions or updates, please contact [facility] Please consult [email or web address] for contact info under Cardiology/STEMI.    Signed, Glenetta Hew, MD  06/27/2018 12:27 PM

## 2018-06-27 NOTE — Progress Notes (Signed)
[facility] Progress Note Patient Name: Alexander Whitaker DOB: 10/01/1950 MRN: [id number]   Date of Service  06/27/2018  HPI/Events of Note  Troponin #1 = 2.05  eICU Interventions  Will order: 1. Heparin per pharmacy consultation - ACS protocol 2. D/C Heparin [place].     Intervention Category Major Interventions: Other:  [name],Steven Cornelia Copa 06/27/2018, 12:19 AM

## 2018-06-27 NOTE — Progress Notes (Signed)
Patient transferred to Cath lab via bed .toleratated well.

## 2018-06-27 NOTE — Progress Notes (Signed)
I called patient's wife to update her that Dr Ellyn Hack with cardiology had consulted on her husband's care . I informed her of the Cardiac catherization that was going to be performed and she agrreed and feels that her husband would want everything done despite them not having discussed goals of care before.

## 2018-06-27 NOTE — Progress Notes (Signed)
Negative SARS-CoV-2 result from [facility] 06/27/18 scanned into chart.

## 2018-06-27 NOTE — Progress Notes (Signed)
ANTICOAGULATION CONSULT NOTE - Initial Consult  Pharmacy Consult for Heparin Indication: chest pain/ACS  No Known Allergies  Patient Measurements: Height: 5\' 5"  (165.1 cm) Weight: 157 lb 13.6 oz (71.6 kg) IBW/kg (Calculated) : 61.5  Vital Signs: Temp: 98.8 F (37.1 C) (04/01 0000) Temp Source: Esophageal (04/01 0000) BP: 140/54 (03/31 2300) Pulse Rate: 106 (04/01 0000)  Labs: Recent Labs    06/24/18 0639 06/25/18 0443 06/25/18 1442 06/26/18 0519 06/26/18 2153  HGB 11.3* 10.7*  --  10.9*  --   HCT 34.7* 33.3*  --  34.8*  --   PLT 281 208  --  147*  --   CREATININE 5.94* 4.45* 3.43* 2.86* 3.20*  TROPONINI  --   --   --   --  2.05*    Estimated Creatinine Clearance: 19 mL/min (A) (by C-G formula based on SCr of 3.2 mg/dL (H)).   Medical History: Past Medical History:  Diagnosis Date  . Anemia   . Arthritis    "probably in his joints" (11/21/2017)  . CHF (congestive heart failure) ([place]) ~ 2017  . CKD stage 3 due to type 2 diabetes mellitus ([place])    stage 5 Dialysis T/Th/Sa  . CVA (cerebral vascular accident) [facility]) 2007   wife denies residual on 11/21/2017  . Diabetic peripheral neuropathy ([place])   . Dyspnea   . ESRD (end stage renal disease) on dialysis ([place])    "[place]; TTS" (11/21/2017)  . GERD (gastroesophageal reflux disease)    has in the past  . Headache    since going on dialysis  . Hypertension   . Hypothyroidism   . PONV (postoperative nausea and vomiting)   . Restless legs   . Type II diabetes mellitus ([facility])     Medications:  Medications Prior to Admission  Medication Sig Dispense Refill Last Dose  . albuterol (PROVENTIL HFA;VENTOLIN HFA) 108 (90 Base) MCG/ACT inhaler Inhale 2 puffs into the lungs every 6 (six) hours as needed for wheezing or shortness of breath.   Past Week at Unknown time  . amLODipine (NORVASC) 10 MG tablet Take 1 tablet (10 mg total) by mouth daily. 30 tablet 0 Past Week at Unknown time  . camphor-menthol (SARNA) lotion  Apply 1 application topically as needed for itching.   Past Week at Unknown time  . clonazePAM (KLONOPIN) 2 MG tablet Take 2 mg by mouth 3 (three) times daily as needed for anxiety.   Past Week at Unknown time  . diclofenac sodium (VOLTAREN) 1 % GEL Apply 2 g topically 3 (three) times daily as needed for pain. knees   Past Month at Unknown time  . escitalopram (LEXAPRO) 10 MG tablet Take 10 mg by mouth daily.   Past Week at Unknown time  . famotidine (PEPCID) 40 MG tablet Take 40 mg by mouth daily.   Past Week at Unknown time  . gabapentin (NEURONTIN) 300 MG capsule Take 300 mg by mouth 2 (two) times daily.   Past Week at Unknown time  . hydrOXYzine (ATARAX/VISTARIL) 25 MG tablet Take 25 mg by mouth 2 (two) times daily as needed for itching.    Past Month at Unknown time  . Insulin Detemir (LEVEMIR) 100 UNIT/ML Pen Inject 10 Units into the skin daily at 12 noon. (Patient taking differently: Inject 50 Units into the skin daily at 12 noon. )   Past Week at Unknown time  . levothyroxine (SYNTHROID, LEVOTHROID) 50 MCG tablet Take 50 mcg by mouth daily before breakfast.  Past Week at Unknown time  . lidocaine-prilocaine (EMLA) cream Apply 1 application topically See admin instructions. Apply small amount to access site 1 to 2 hours before dialysis.  12 Past Week at Unknown time  . loratadine (CLARITIN) 10 MG tablet Take 10 mg by mouth daily as needed for rhinitis.    Past Week at Unknown time  . Melatonin 10 MG CAPS Take 10 mg by mouth at bedtime as needed (sleep).    Past Week at Unknown time  . ondansetron (ZOFRAN) 4 MG tablet Take 4 mg by mouth 4 (four) times daily as needed for nausea/vomiting.   Past Week at Unknown time  . pantoprazole (PROTONIX) 40 MG tablet Take 40 mg by mouth daily.   Past Week at Unknown time  . pramipexole (MIRAPEX) 0.5 MG tablet Take 0.5 mg by mouth at bedtime as needed (sleep).   Past Week at Unknown time    Assessment: 68 y.o. male with elevated cardiac markers for  heparin.  Heparin 5000 SQ given at 10 pm  Goal of Therapy:  Heparin level 0.3-0.7 units/ml Monitor platelets by anticoagulation protocol: Yes   Plan:  Heparin 2000 units IV bolus, then start heparin 850 units/hr Follow-up am labs.    Caryl Pina 06/27/2018,12:23 AM

## 2018-06-27 NOTE — Consult Note (Addendum)
Cardiology Consultation:   Patient ID: [id number] ORE; [id number]; 1950-10-05   Admit date: 06/24/2018 Date of Consult: 06/27/2018  Primary Care Provider: [name], MD Primary Cardiologist: Glenetta Hew, MD New Primary Electrophysiologist:  None   Patient Profile:   Alexander Whitaker is a 68 y.o. male with a hx of DM2, HTN, ESRD on HD, GERD, hypothyroid, D-CHF, CVA, who is being seen today for the evaluation of cardiac arrest at the request of Dr. Nelda Marseille and Woody Seller Minor, NP.  History of Present Illness:   Mr. Modica was admitted to [facility] w/ fever, diffuse infiltrates on CXR, decreased LOC, CBG 638. Intubated, on ABX and insulin gtt. --> COVID-19 screen has recently been returned as negative per Dr. Edison Nasuti cube.  Tx to [facility] 03/29. HD per Renal.  03/31, SBP 190 and HR 180 during wean trial. Prev settings renewed, pt given Amio 150 mg and metoprolol 5 mg IV. HR improved. HR increased again w/ vomiting, metoprolol 2.5 mg IV but BP dropped and Levo restarted.  03/31, After HR slowed, ECG ordered and ?ischemia>>ASA 81 mg qd and troponins ordered>>elevated w/ peak 2.75.   04/01, 3 am, pt went into Afib, RVR, Amio IV started. Ph 7.246 on ABG>>bicarb  04/01 after 10 am, pt became tachycardic w/ no stimulus, possible torsades vs VT>>possible coarse VF. Lost pulses, got compressions for a brief time and defib x 1, ROSC. Never got Epi. Cards asked to see.   Pt is intubated and sedated, though agitated at times on the vent.   Information is obtained from staff, chart notes and paper chart from [place].--Per Dr. Fabio Bering, COVID-19 screen did return is negative although this is not obviously visible in the computer char--likely because it was done at an outside hospital) -ICU staff has discontinued precautions.  Unable to assess symptomatology because of his intubation and agitation.  Per nursing report, he has very labile blood pressures ranging anywhere from systolics in the  15A to as high as a 210s.  Currently on Neo-Synephrine because of post CPR hypotension.  Now hypertensive with blood pressures over 569 systolic.  Heart rate is ranging anywhere from 80s to 110 with agitation.   Past Medical History:  Diagnosis Date  . Anemia   . Arthritis    per pt (11/21/2017)  . CHF (congestive heart failure) ([place]) ~ 2017  . CKD stage 3 due to type 2 diabetes mellitus ([place])    stage 5 Dialysis T/Th/Sa  . CVA (cerebral vascular accident) [facility]) 2007   wife denies residual on 11/21/2017  . Diabetic peripheral neuropathy ([place])   . Dyspnea   . ESRD (end stage renal disease) on dialysis ([place])    "[place]; TTS" (11/21/2017)  . GERD (gastroesophageal reflux disease)    has in the past  . Headache    since going on dialysis  . Hypertension   . Hypothyroidism   . PONV (postoperative nausea and vomiting)   . Restless legs   . Type II diabetes mellitus ([place])     Past Surgical History:  Procedure Laterality Date  . AV FISTULA PLACEMENT Right 10/07/2016   Procedure: RIGHT BRACHIOCEPHALIC ARTERIOVENOUS (AV) FISTULA CREATION;  Surgeon: Angelia Mould, MD;  Location: [facility];  Service: Vascular;  Laterality: Right;  . BACK SURGERY    . CATARACT EXTRACTION W/ INTRAOCULAR LENS IMPLANT Left   . CIRCUMCISION  2015  . HERNIA REPAIR  2016   "stomach"  . POSTERIOR LUMBAR FUSION  1990s?   "has  bolts and rods in there"     Prior to Admission medications   Medication Sig Start Date End Date Taking? Authorizing Provider  albuterol (PROVENTIL HFA;VENTOLIN HFA) 108 (90 Base) MCG/ACT inhaler Inhale 2 puffs into the lungs every 6 (six) hours as needed for wheezing or shortness of breath.   Yes [provider]  amLODipine (NORVASC) 10 MG tablet Take 1 tablet (10 mg total) by mouth daily. 11/25/17  Yes Patrecia Pour, Christean Grief, MD  camphor-menthol [facility]) lotion Apply 1 application topically as needed for itching.   Yes [provider]  clonazePAM (KLONOPIN) 2 MG tablet  Take 2 mg by mouth 3 (three) times daily as needed for anxiety.   Yes [provider]  diclofenac sodium (VOLTAREN) 1 % GEL Apply 2 g topically 3 (three) times daily as needed for pain. knees 05/29/18  Yes [provider]  escitalopram (LEXAPRO) 10 MG tablet Take 10 mg by mouth daily. 06/07/18  Yes [provider]  famotidine (PEPCID) 40 MG tablet Take 40 mg by mouth daily.   Yes [provider]  gabapentin (NEURONTIN) 300 MG capsule Take 300 mg by mouth 2 (two) times daily.   Yes [provider]  hydrOXYzine (ATARAX/VISTARIL) 25 MG tablet Take 25 mg by mouth 2 (two) times daily as needed for itching.    Yes [provider]  Insulin Detemir (LEVEMIR) 100 UNIT/ML Pen Inject 10 Units into the skin daily at 12 noon. Patient taking differently: Inject 50 Units into the skin daily at 12 noon.  10/07/16  Yes Alvia Grove, PA-C  levothyroxine (SYNTHROID, LEVOTHROID) 50 MCG tablet Take 50 mcg by mouth daily before breakfast.  09/19/16  Yes [provider]  lidocaine-prilocaine (EMLA) cream Apply 1 application topically See admin instructions. Apply small amount to access site 1 to 2 hours before dialysis. 11/13/17  Yes [provider]  loratadine (CLARITIN) 10 MG tablet Take 10 mg by mouth daily as needed for rhinitis.    Yes [provider]  Melatonin 10 MG CAPS Take 10 mg by mouth at bedtime as needed (sleep).    Yes [provider]  ondansetron (ZOFRAN) 4 MG tablet Take 4 mg by mouth 4 (four) times daily as needed for nausea/vomiting. 06/17/18  Yes [provider]  pantoprazole (PROTONIX) 40 MG tablet Take 40 mg by mouth daily.   Yes [provider]  pramipexole (MIRAPEX) 0.5 MG tablet Take 0.5 mg by mouth at bedtime as needed (sleep).   Yes [provider]    Inpatient Medications: Scheduled Meds: . aspirin  81 mg Per Tube Daily  . chlorhexidine gluconate (MEDLINE KIT)  15 mL Mouth Rinse  BID  . Chlorhexidine Gluconate Cloth  6 each Topical Daily  . clonazePAM  1 mg Per Tube Q8H  . feeding supplement (PRO-STAT SUGAR FREE 64)  30 mL Per Tube QID  . feeding supplement (VITAL 1.5 CAL)  1,000 mL Per Tube Q24H  . insulin aspart  0-24 Units Subcutaneous Q4H  . levothyroxine  50 mcg Per Tube Q0600  . mouth rinse  15 mL Mouth Rinse 10 times per day  . metoprolol tartrate      . pantoprazole (PROTONIX) IV  40 mg Intravenous Q24H  . sodium chloride flush  10-40 mL Intracatheter Q12H   Continuous Infusions: . amiodarone 60 mg/hr (06/27/18 1006)   Followed by  . amiodarone 30 mg/hr (06/27/18 1152)  . [START ON 06/28/2018] azithromycin    . cefTRIAXone (ROCEPHIN)  IV  Stopped (06/26/18 2228)  . fentaNYL infusion INTRAVENOUS Stopped (06/27/18 0809)  . heparin 1,100 Units/hr (06/27/18 1100)  . phenylephrine (NEO-SYNEPHRINE) Adult infusion 60 mcg/min (06/27/18 1115)   PRN Meds: acetaminophen, docusate, fentaNYL, midazolam, midazolam, ondansetron (ZOFRAN) IV, sodium chloride flush  Allergies:   No Known Allergies  Social History:  No additional info obtainable due to patient condition.  Social History   Socioeconomic History  . Marital status: Married    Spouse name: Not on file  . Number of children: Not on file  . Years of education: Not on file  . Highest education level: Not on file  Occupational History  . Occupation: retired  Scientific laboratory technician  . Financial resource strain: Not on file  . Food insecurity:    Worry: Not on file    Inability: Not on file  . Transportation needs:    Medical: Not on file    Non-medical: Not on file  Tobacco Use  . Smoking status: Former Smoker    Packs/day: 1.00    Years: 57.00    Pack years: 57.00    Types: Cigarettes    Last attempt to quit: 03/28/2017    Years since quitting: 1.2  . Smokeless tobacco: Never Used  Substance and Sexual Activity  . Alcohol use: Not Currently  . Drug use: Never  . Sexual activity: Not Currently   Lifestyle  . Physical activity:    Days per week: Not on file    Minutes per session: Not on file  . Stress: Not on file  Relationships  . Social connections:    Talks on phone: Not on file    Gets together: Not on file    Attends religious service: Not on file    Active member of club or organization: Not on file    Attends meetings of clubs or organizations: Not on file    Relationship status: Not on file  . Intimate partner violence:    Fear of current or ex partner: Not on file    Emotionally abused: Not on file    Physically abused: Not on file    Forced sexual activity: Not on file  Other Topics Concern  . Not on file  Social History Narrative  . Not on file    Family History:   Family History  Problem Relation Age of Onset  . Leukemia Mother 77  . CVA Father 31   Family Status:  Family Status  Relation Name Status  . Mother  Deceased  . Father  Deceased    ROS:  Please see the history of present illness.  Not obtainable -patient is intubated and sedated.  Physical Exam/Data:   Vitals:   06/27/18 1015 06/27/18 1030 06/27/18 1045 06/27/18 1100  BP:    (!) 139/57  Pulse: 61 (!) 59 (!) 58 63  Resp: (!) 24 (!) 24 (!) 24 (!) 24  Temp:      TempSrc:      SpO2: 100% 100% 100% 100%  Weight:      Height:        Intake/Output Summary (Last 24 hours) at 06/27/2018 1227 Last data filed at 06/27/2018 1100 Gross per 24 hour  Intake 1387.93 ml  Output 1338 ml  Net 49.93 ml   Filed Weights   06/25/18 0500 06/26/18 0500 06/27/18 0445  Weight: 73.8 kg 71.6 kg 69.3 kg   Body mass index is 25.42 kg/m.  General: Intubated, currently not sedated very agitated. HEENT: normal; ET tube and OG  tube in place Neck: Not able to assess JVD or bruit because of moving. Endocrine:  No thryomegaly Vascular: No carotid bruits; 4/4 extremity pulses 2+, without bruits  Cardiac: RRR with no obvious M/R/G.  Difficult to assess because of moving. Lungs: Mild diffuse coarse breath  sounds, but no obvious rales or rhonchi. Abd: soft, nontender, no hepatomegaly  Ext: no edema Musculoskeletal:  No deformities, BUE and BLE strength normal and equal Skin: warm and dry  Neuro/Psych: Very agitated with sedation being weaned postcode   EKG:  The EKG was personally reviewed and demonstrates:  03/31, SR, HR 79, deep anterolateral T wave inversions Current EKGs are somewhat confusing as are 2 separate EKGs due to patient moving.  1 does not have a lead V3.  Much less prominent anterolateral T wave inversions with subtle biphasic ST and T wave in changes and T wave inversions in what looks like V2 through V4.  Telemetry:  Telemetry was personally reviewed and demonstrates:  SR, ?torsades episodes yesterday, and again today w/ several episodes of short VT followed by deterioration into VF at roughly 1030 this morning--CPR-->>shock>>SR. --While I was present post code he did have one brief burst of VT as well.  Relevant CV Studies:  ECHO: 11/22/2017 - Left ventricle: The cavity size was normal. Systolic function was   normal. The estimated ejection fraction was in the range of 60%   to 65%. Wall motion was normal; there were no regional wall   motion abnormalities. There was an increased relative   contribution of atrial contraction to ventricular filling.   Doppler parameters are consistent with abnormal left ventricular   relaxation (grade 1 diastolic dysfunction). Doppler parameters   are consistent with high ventricular filling pressure. - Aortic valve: Trileaflet; normal thickness, mildly calcified   leaflets. - Tricuspid valve: There was trivial regurgitation. - Pulmonic valve: There was trivial regurgitation. - Pulmonary arteries: PA peak pressure: 35 mm Hg (S).   Laboratory Data:  Chemistry Recent Labs  Lab 06/26/18 0519 06/26/18 2153  06/27/18 0437 06/27/18 0738 06/27/18 1102  NA 138 136   < > 139 142 143  K 4.3 3.9   < > 4.1 3.7 3.6  CL 103 105  --  103   --   --   CO2 23 19*  --  15*  --   --   GLUCOSE 131* 109*  --  157*  --   --   BUN 17 23  --  26*  --   --   CREATININE 2.86* 3.20*  --  4.26*  --   --   CALCIUM 7.8* 7.8*  --  8.1*  --   --   GFRNONAA 21* 19*  --  13*  --   --   GFRAA 25* 22*  --  15*  --   --   ANIONGAP 12 12  --  21*  --   --    < > = values in this interval not displayed.    Lab Results  Component Value Date   ALT 11 06/24/2018   AST 18 06/24/2018   ALKPHOS 83 06/24/2018   BILITOT 0.7 06/24/2018   Hematology Recent Labs  Lab 06/25/18 0443 06/26/18 0519  06/27/18 0437 06/27/18 0738 06/27/18 1102  WBC 18.9* 11.8*  --  13.9*  --   --   RBC 3.57* 3.52*  --  3.65*  --   --   HGB 10.7* 10.9*   < > 10.9* 9.5* 9.5*  HCT 33.3* 34.8*   < > 36.7* 28.0* 28.0*  MCV 93.3 98.9  --  100.5*  --   --   MCH 30.0 31.0  --  29.9  --   --   MCHC 32.1 31.3  --  29.7*  --   --   RDW 15.0 15.4  --  15.1  --   --   PLT 208 147*  --  180  --   --    < > = values in this interval not displayed.   Cardiac Enzymes Recent Labs  Lab 06/26/18 2153 06/27/18 0437 06/27/18 1012  TROPONINI 2.05* 2.65* 2.75*   No results for input(s): TROPIPOC in the last 168 hours.  BNPNo results for input(s): BNP, PROBNP in the last 168 hours.  DDimer No results for input(s): DDIMER in the last 168 hours. TSH:  Lab Results  Component Value Date   TSH 2.533 11/21/2017   Lipids: Lab Results  Component Value Date   CHOL 183 11/22/2017   HDL 27 (L) 11/22/2017   LDLCALC 99 11/22/2017   TRIG 199 (H) 06/27/2018   CHOLHDL 6.8 11/22/2017   HgbA1c: Lab Results  Component Value Date   HGBA1C 9.1 (H) 11/21/2017   Magnesium:  Magnesium  Date Value Ref Range Status  06/27/2018 2.6 (H) 1.7 - 2.4 mg/dL Final    Comment:    Performed at [facility], [place] [address], [place], [place] [address]     Radiology/Studies:  Dg Abd 1 View  Result Date: 06/27/2018 CLINICAL DATA:  Respiratory failure, abdominal distension EXAM: ABDOMEN - 1  VIEW COMPARISON:  None. FINDINGS: Endotracheal tube with tip in the gastric body. Side port just below the GE junction. No dilated to large or small bowel. Posterior lumbar fusion. IMPRESSION: 1. No evidence of bowel obstruction. 2. NG tube in stomach. Electronically Signed   By: Suzy Bouchard M.D.   On: 06/27/2018 07:37   Dg Abd 1 View  Result Date: 06/24/2018 CLINICAL DATA:  Orogastric tube placement. EXAM: ABDOMEN - 1 VIEW COMPARISON:  None. FINDINGS: The bowel gas pattern is normal. Distal tip of nasogastric tube appears to be in distal esophagus. No radio-opaque calculi or other significant radiographic abnormality are seen. IMPRESSION: Distal tip of nasogastric tube appears to be in distal esophagus; advancement is recommended. No evidence of bowel obstruction or ileus. Electronically Signed   By: Marijo Conception, M.D.   On: 06/24/2018 08:54   Dg Chest Port 1 View  Result Date: 06/27/2018 CLINICAL DATA:  Respiratory failure EXAM: PORTABLE CHEST 1 VIEW COMPARISON:  06/26/2018 FINDINGS: Support devices are stable. Mild cardiomegaly and vascular congestion. Slight right base atelectasis, improving since prior study. No confluent opacity on the left. No effusions or acute bony abnormality. IMPRESSION: Mild cardiomegaly.  Improving right base atelectasis. Electronically Signed   By: Rolm Baptise M.D.   On: 06/27/2018 07:33   Dg Chest Port 1 View  Result Date: 06/26/2018 CLINICAL DATA:  Respiratory failure EXAM: PORTABLE CHEST 1 VIEW COMPARISON:  06/25/2018 FINDINGS: Endotracheal tube, gastric catheter and right jugular central line are again seen and stable. No pneumothorax is noted. Mild central vascular congestion is noted. Some patchy opacity is noted in the right lung base likely representing developing atelectasis. This is somewhat accentuated by low overlying extrinsic artifact. No acute bony abnormality is noted. IMPRESSION: Slight increase in right basilar atelectatic changes. Tubes and lines  as described. Electronically Signed   By: Linus Mako.D.  On: 06/26/2018 07:42   Dg Chest Port 1 View  Result Date: 06/25/2018 CLINICAL DATA:  68 year old male with acute respiratory failure EXAM: PORTABLE CHEST 1 VIEW COMPARISON:  Prior chest x-ray 06/24/2018 FINDINGS: The patient remains intubated. The tip of the endotracheal tube is 5.5 cm above the carina. A right IJ non tunneled hemodialysis catheter remains in stable position with the tip overlying the upper SVC. Stable enlargement of the cardiopericardial silhouette. Perihilar and bibasilar interstitial and airspace opacities are present along with pulmonary vascular congestion most consistent with pulmonary edema. Similar appearance of multifocal right-sided rib fractures. Atherosclerotic calcifications again noted in the transverse aorta. The upper abdominal bowel gas pattern is unremarkable. IMPRESSION: 1. The tip of the endotracheal tube is 5.5 cm above the carina. 2. The tip of the right IJ non tunneled hemodialysis catheter overlies the upper SVC. 3. Stable cardiomegaly and mild pulmonary edema. Electronically Signed   By: Jacqulynn Cadet M.D.   On: 06/25/2018 07:45   Dg Chest Port 1 View  Result Date: 06/24/2018 CLINICAL DATA:  Evaluate central line placement EXAM: PORTABLE CHEST 1 VIEW COMPARISON:  Earlier same day FINDINGS: Endotracheal tube tip is 4 cm above the carina. New right internal jugular central line tip is in the SVC at the azygos level. Patchy bilateral pulmonary infiltrates persist. No other new finding. IMPRESSION: New right internal jugular central line tip in the SVC at the azygos level. Electronically Signed   By: Nelson Chimes M.D.   On: 06/24/2018 18:40   Dg Chest Port 1 View  Result Date: 06/24/2018 CLINICAL DATA:  Respiratory distress. EXAM: PORTABLE CHEST 1 VIEW COMPARISON:  Radiograph of June 23, 2018. FINDINGS: Stable cardiomediastinal silhouette. Endotracheal and nasogastric tubes are unchanged in position.  Interval placement of right internal jugular catheter with distal tip looped in superior vena cava. No pneumothorax or pleural effusion is noted. Stable mild left lingular opacity is noted as well as right perihilar opacity, concerning for edema or pneumonia. Bony thorax is unremarkable. IMPRESSION: Interval placement of right internal jugular catheter with distal tip looped within superior vena cava. No pneumothorax is noted. Stable bilateral lung opacities are noted concerning for pneumonia or possibly edema. Electronically Signed   By: Marijo Conception, M.D.   On: 06/24/2018 08:52   Dg Abd Portable 1v  Result Date: 06/25/2018 CLINICAL DATA:  NG tube placement. EXAM: PORTABLE ABDOMEN - 1 VIEW COMPARISON:  None. FINDINGS: NG tube tip lies along the greater curvature of the stomach. Gas pattern is nonspecific. Lumbar spine hardware is stable. IMPRESSION: NG tube tip lies along the greater curvature of the stomach. Electronically Signed   By: Staci Righter M.D.   On: 06/25/2018 19:09    Assessment and Plan:   1. Cardiac arrest: The episode today looks less consistent with polymorphic VT/torsades than simple VT degenerating into VF.  Continue amiodarone for bursts of what it had been significantly tachycardic SVT and now VT.  Given the grossly abnormal EKG yesterday and still present anterior ST of T wave changes, need to exclude anterior wall ischemia as the cause versus stress-induced cardiomyopathy/Takotsubo.  This is also in the setting of having elevated troponin levels.3  Have discussed with Cath Lab staff.  Plan will be to do urgent cardiac catheterization today, however this will be done with COVID-19 precautions as there is no clear documentation of a negative test result, and he is coming from 2 AM ICU with multiple COVID-19 with outpatients.  Would asked that he be back  on full sedation (per nursing report was very responsive to Versed)  2. NSTEMI -versus demand ischemia with severe  tachycardia in the setting of potential CAD.  Did have a notable bump in troponins today.- higher than expected from tachycardia  deep T wave inversions are new, repeat ECG today showed less prominent T wave inversions --> however in light of elevated troponin, now VF arrest with notable EKG changes consistent with possible anterior wall ischemia versus stress-induced cardiomyopathy, I think the best course of action would be proceed with cardiac catheterization.  As he does seem to be improving overall from medical medical standpoint, this would be consistent with a negative COVID-19 screen.  At this point I do think that it is relatively prudent to proceed with ischemic evaluation in light of recurrent VT.  We will need to have heparin stopped on call to Cath Lab  Would proceed with checking 2D echocardiogram  He does have dialysis planned for tomorrow and therefore cath today would be prudent.  Pending results of cardiac catheterization, will need to address issues such as hyperlipidemia etc.  Labile blood pressures: Apparently did not tolerate beta-blocker because of hypotension, currently now actually on pressors postarrest.  We will try to wean off of Neo-Synephrine.  Hold off on beta-blocker for now, continue amiodarone.   Active Problems:   Cardiac arrest with ventricular fibrillation (HCC)   Non-ST elevation (NSTEMI) myocardial infarction (HCC)   Acute diastolic congestive heart failure (HCC)   Diabetes mellitus, type 2 (HCC)   ESRD (end stage renal disease) ([place])   Essential hypertension   Respiratory failure with hypoxia ([place])   Sepsis ([place])   Note initially started by Rosaria Ferries, PA-C.  Note finalized by the undersigned who performed the in-hospital component of patient evaluation, telemetry and EKG review as well as assessment and plan.  For questions or updates, please contact [facility] Please consult [email or web address] for contact info under Cardiology/STEMI.    Signed, Glenetta Hew, MD  06/27/2018 12:27 PM

## 2018-06-27 NOTE — Progress Notes (Signed)
Report called to [facility] for 2 H

## 2018-06-27 NOTE — Progress Notes (Signed)
Nutrition Follow-up  RD working remotely.  DOCUMENTATION CODES:   Not applicable  INTERVENTION:   Continue TF via NGT:  Vital 1.5 at 40 ml/h (960 ml per day)  Pro-stat 30 ml QID  Provides 1840 kcal, 125 gm protein, 733 ml free water daily  NUTRITION DIAGNOSIS:   Inadequate oral intake related to inability to eat as evidenced by NPO status.  Ongoing   GOAL:   Patient will meet greater than or equal to 90% of their needs  Met with TF  MONITOR:   Vent status, Labs, Skin, I & O's  ASSESSMENT:   68 yo male with PMH of HTN, dyspnea, ESRD on HD, CHF, DM-2, CVA who was admitted from [facility] with AMS, respiratory failure requiring intubation.  COVID-19 test from outside hospital resulted negative. Per review of progress notes, COVID-19 test reordered today.  Patient coded with a wide-complex tachycardia event this morning requiring chest compressions for less than 1 minute. Cardiology is now following.  No HD planned for today.  Patient remains intubated on ventilator support MV: 11.8 L/min Temp (24hrs), Avg:97.3 F (36.3 C), Min:94.6 F (34.8 C), Max:100.8 F (38.2 C)   NGT placed by RN on 3/30. Currently receiving Vital 1.5 at 40 ml/h with Pro-stat 30 ml QID to provide 1840 kcal, 125 gm protein, 733 ml free water daily.  Labs reviewed. BUN 26 (H), creatinine 4.26 (H), magnesium 2.6 (H) CBG's: 124-580-99 Medications reviewed and include fentanyl, neosynephrine, novolog.  Weight down 7 kg since admission Net I/O -3.2 L  Diet Order:   Diet Order    None      EDUCATION NEEDS:   No education needs have been identified at this time  Skin:  Skin Assessment: Reviewed RN Assessment  Last BM:  4/1 (type 7)  Height:   Ht Readings from Last 1 Encounters:  06/24/18 5' 5"  (1.651 m)    Weight:   Wt Readings from Last 1 Encounters:  06/27/18 69.3 kg    Ideal Body Weight:  61.8 kg  BMI:  Body mass index is 25.42 kg/m.  Estimated Nutritional  Needs:   Kcal:  1870  Protein:  105-125 gm  Fluid:  1 L + UOP    Molli Barrows, RD, LDN, [facility] Pager [phone] After Hours Pager [phone]

## 2018-06-27 NOTE — Progress Notes (Signed)
Patients COVID results came back negative from [facility] .Results placed in chart . Covid precautions removed per Dr Nelda Marseille and Kiim AD and [facility] RN have discussed per protocol

## 2018-06-27 NOTE — Progress Notes (Signed)
eLink Physician-Brief Progress Note Patient Name: Alexander Whitaker DOB: [birth year] MRN: [id number]   Date of Service  06/27/2018  HPI/Events of Note  AFIB with RVR - Ventricular rate = 144. BP = 109/52. Currently on Norepinephrine IV infusion for hemodynamic support.   eICU Interventions  Will order: 1. Amiodarone IV bolus and infusion.      Intervention Category Major Interventions: Arrhythmia - evaluation and management  [name],[name] 06/27/2018, 3:22 AM

## 2018-06-27 NOTE — Progress Notes (Signed)
Increased RR to 24 per MD order.

## 2018-06-27 NOTE — Interval H&P Note (Signed)
Cath Lab Visit (complete for each Cath Lab visit)  Clinical Evaluation Leading to the Procedure:   ACS: Yes.    Non-ACS:    Anginal Classification: CCS IV  Anti-ischemic medical therapy: Maximal Therapy (2 or more classes of medications)  Non-Invasive Test Results: No non-invasive testing performed  Prior CABG: No previous CABG      History and Physical Interval Note:  06/27/2018 4:05 PM  Alexander Whitaker  has presented today for surgery, with the diagnosis of chest pain/vt.  The various methods of treatment have been discussed with the patient and family. After consideration of risks, benefits and other options for treatment, the patient has consented to  Procedure(s): LEFT HEART CATH AND CORONARY ANGIOGRAPHY (N/A) as a surgical intervention.  The patient's history has been reviewed, patient examined, no change in status, stable for surgery.  I have reviewed the patient's chart and labs.  Questions were answered to the patient's satisfaction.     Shelva Majestic

## 2018-06-27 NOTE — Progress Notes (Signed)
FiO2 dropped to 305 per ABG results.  Pt tolerating well.

## 2018-06-27 NOTE — Progress Notes (Signed)
ANTICOAGULATION CONSULT NOTE - Follow Up Consult  Pharmacy Consult for heparin Indication: chest pain/ACS  positive troponin   No Known Allergies  Patient Measurements: Height: 5\' 5"  (165.1 cm) Weight: 152 lb 12.5 oz (69.3 kg) IBW/kg (Calculated) : 61.5 Heparin Dosing Weight: 69  Vital Signs: Temp: 99.6 F (37.6 C) (04/01 0742) Temp Source: Oral (04/01 0742) BP: 121/42 (04/01 0800) Pulse Rate: 68 (04/01 0800)  Labs: Recent Labs    06/25/18 0443  06/26/18 0519 06/26/18 2153 06/27/18 0320 06/27/18 0437 06/27/18 0800  HGB 10.7*  --  10.9*  --  10.2* 10.9*  --   HCT 33.3*  --  34.8*  --  30.0* 36.7*  --   PLT 208  --  147*  --   --  180  --   HEPARINUNFRC  --   --   --   --   --   --  0.18*  CREATININE 4.45*   < > 2.86* 3.20*  --  4.26*  --   TROPONINI  --   --   --  2.05*  --  2.65*  --    < > = values in this interval not displayed.    Estimated Creatinine Clearance: 14.2 mL/min (A) (by C-G formula based on SCr of 4.26 mg/dL (H)).   Medications:   Medications Prior to Admission  Medication Sig Dispense Refill Last Dose  . albuterol (PROVENTIL HFA;VENTOLIN HFA) 108 (90 Base) MCG/ACT inhaler Inhale 2 puffs into the lungs every 6 (six) hours as needed for wheezing or shortness of breath.   Past Week at Unknown time  . amLODipine (NORVASC) 10 MG tablet Take 1 tablet (10 mg total) by mouth daily. 30 tablet 0 Past Week at Unknown time  . camphor-menthol (SARNA) lotion Apply 1 application topically as needed for itching.   Past Week at Unknown time  . clonazePAM (KLONOPIN) 2 MG tablet Take 2 mg by mouth 3 (three) times daily as needed for anxiety.   Past Week at Unknown time  . diclofenac sodium (VOLTAREN) 1 % GEL Apply 2 g topically 3 (three) times daily as needed for pain. knees   Past Month at Unknown time  . escitalopram (LEXAPRO) 10 MG tablet Take 10 mg by mouth daily.   Past Week at Unknown time  . famotidine (PEPCID) 40 MG tablet Take 40 mg by mouth daily.   Past Week  at Unknown time  . gabapentin (NEURONTIN) 300 MG capsule Take 300 mg by mouth 2 (two) times daily.   Past Week at Unknown time  . hydrOXYzine (ATARAX/VISTARIL) 25 MG tablet Take 25 mg by mouth 2 (two) times daily as needed for itching.    Past Month at Unknown time  . Insulin Detemir (LEVEMIR) 100 UNIT/ML Pen Inject 10 Units into the skin daily at 12 noon. (Patient taking differently: Inject 50 Units into the skin daily at 12 noon. )   Past Week at Unknown time  . levothyroxine (SYNTHROID, LEVOTHROID) 50 MCG tablet Take 50 mcg by mouth daily before breakfast.    Past Week at Unknown time  . lidocaine-prilocaine (EMLA) cream Apply 1 application topically See admin instructions. Apply small amount to access site 1 to 2 hours before dialysis.  12 Past Week at Unknown time  . loratadine (CLARITIN) 10 MG tablet Take 10 mg by mouth daily as needed for rhinitis.    Past Week at Unknown time  . Melatonin 10 MG CAPS Take 10 mg by mouth at bedtime as needed (  sleep).    Past Week at Unknown time  . ondansetron (ZOFRAN) 4 MG tablet Take 4 mg by mouth 4 (four) times daily as needed for nausea/vomiting.   Past Week at Unknown time  . pantoprazole (PROTONIX) 40 MG tablet Take 40 mg by mouth daily.   Past Week at Unknown time  . pramipexole (MIRAPEX) 0.5 MG tablet Take 0.5 mg by mouth at bedtime as needed (sleep).   Past Week at Unknown time    Assessment: 68 YO male who is hospitalized for suspected COVID-19. Patient had EKG abnormalities and cycle troponins were ordered. Troponin positive at 2.05/2.65. Pharmacy consulted to start heparin gtt for anticoagulation.   Heparin level subtherapeutic at 0.18. Hemoglobin low stable at 10.9, platelets WNL at 180. No bleeding noted and confirmed with nursing.    Goal of Therapy:  Heparin level 0.3-0.7 units/ml Monitor platelets by anticoagulation protocol: Yes   Plan:  Increase heparin gtt to 1100 units/hour  Check anti-Xa level in 8 hours and daily while on heparin   Daily CBC while on heparin Monitor for signs and symptoms of bleeding  Azzie Roup D PGY1 Pharmacy Resident  Phone [phone] Please use [facility] for clinical pharmacists numbers  06/27/2018      8:45 AM

## 2018-06-27 NOTE — Progress Notes (Signed)
VAST RN x2 responded to Code Blue. Pt with 2 working PIV's in place as well as HD cath.

## 2018-06-27 NOTE — Procedures (Signed)
CPR  VT arrest, 5 min CPR and defib.  ROSC after 5 min.  See sheet for details  Rush Farmer, M.D. [facility] Pulmonary/Critical Care Medicine. Pager: [phone]. After hours pager: [phone].

## 2018-06-27 NOTE — Progress Notes (Signed)
[facility] Progress Note  Subjective: on vent. Had VT arrest this am, 5 min CPR and responded to IV amiodarone. I/O neg 500 cc yest, wt's down 69kg, GI output > 1000 cc from NG.   Vitals:   06/27/18 1334 06/27/18 1345 06/27/18 1400 06/27/18 1415  BP:  (!) 174/68 (!) 115/50   Pulse:  90 69 (!) 57  Resp:  (!) 24 (!) 24 (!) 24  Temp:      TempSrc:      SpO2:  100% 100% 100%  Weight: 68.9 kg     Height:        Inpatient medications: . aspirin  81 mg Per Tube Daily  . chlorhexidine gluconate (MEDLINE KIT)  15 mL Mouth Rinse BID  . Chlorhexidine Gluconate Cloth  6 each Topical Daily  . clonazePAM  1 mg Per Tube Q8H  . feeding supplement (PRO-STAT SUGAR FREE 64)  30 mL Per Tube QID  . feeding supplement (VITAL 1.5 CAL)  1,000 mL Per Tube Q24H  . insulin aspart  0-24 Units Subcutaneous Q4H  . levothyroxine  50 mcg Per Tube Q0600  . mouth rinse  15 mL Mouth Rinse 10 times per day  . metoprolol tartrate      . pantoprazole (PROTONIX) IV  40 mg Intravenous Q24H  . sodium chloride flush  10-40 mL Intracatheter Q12H   . sodium chloride 10 mL/hr at 06/27/18 1336  . amiodarone 60 mg/hr (06/27/18 1400)   Followed by  . amiodarone Stopped (06/27/18 1314)  . [START ON 06/28/2018] azithromycin    . cefTRIAXone (ROCEPHIN)  IV Stopped (06/26/18 2228)  . fentaNYL infusion INTRAVENOUS 200 mcg/hr (06/27/18 1400)  . heparin 1,100 Units/hr (06/27/18 1400)  . phenylephrine (NEO-SYNEPHRINE) Adult infusion 30 mcg/min (06/27/18 1400)   acetaminophen, docusate, fentaNYL, midazolam, midazolam, ondansetron (ZOFRAN) IV, sodium chloride flush  Iron/TIBC/Ferritin/ %Sat    Component Value Date/Time   IRON 46 03/11/2016 0412   TIBC 207 (L) 03/11/2016 0412   FERRITIN 222 03/11/2016 0412   IRONPCTSAT 22 03/11/2016 0412    Exam: General appearance: intubated and sedated Full contact physical exam was not recommended due to concern for covid-19 infection, utilizing the physical exam of CCM  Dr Jacoub   [place] [facility]  4h  76.5kg   2/2.25 bath  Hep none  RUE AVF  400/800  - Micera 200 mcg IV every 2 weeks  - Venofer 50mg IV once per week      Assessment/Plan: 1.  HCAP/ severe sepsis/ fever/ pulm infiltrates - fevers/ hypothermia alternating. Is getting IV abx and is rule-out for covid-19 (from testing sent from [facility]), results pending.  On pressors today post arrest.  2. VT cardiac arrest : in am 4/1, 5 min CPR responded to IV amio 3.  ESRD - usual HD is TTS. Started CVVHD here 3/29. Held on 3/31, no need for RRT today, will cont off CRRT.  Will reassess in am, may need to resume.  4.  Hypertension/volume  - CXR improved some since admission, residual mild infiltrates.  Net neg 3.0 L since admission. Wt's down  74 > 69kg.  5.  Anemia  - stable 6.  Metabolic bone disease -  stable 7.  Nutrition - npo for now 8. AMS- due to pna and sepsis/ arrest   Rob Schertz [facility] 06/27/2018, 2:44 PM  Recent Labs  Lab 06/25/18 1442 06/26/18 0519 06/26/18 2153  06/27/18 0437 06/27/18 0738 06/27/18 1102  NA 136 138 136   < >   139 142 143  K 4.4 4.3 3.9   < > 4.1 3.7 3.6  CL 104 103 105  --  103  --   --   CO2 24 23 19*  --  15*  --   --   GLUCOSE 152* 131* 109*  --  157*  --   --   BUN 25* 17 23  --  26*  --   --   CREATININE 3.43* 2.86* 3.20*  --  4.26*  --   --   CALCIUM 7.5* 7.8* 7.8*  --  8.1*  --   --   PHOS 2.8 3.8  --   --  3.9  --   --   ALBUMIN 2.5* 2.8*  --   --   --   --   --    < > = values in this interval not displayed.   Recent Labs  Lab 06/24/18 0639  AST 18  ALT 11  ALKPHOS 83  BILITOT 0.7  PROT 4.9*   Recent Labs  Lab 06/24/18 0639  06/26/18 0519  06/27/18 0437 06/27/18 0738 06/27/18 1102  WBC 23.4*   < > 11.8*  --  13.9*  --   --   NEUTROABS 19.3*  --   --   --   --   --   --   HGB 11.3*   < > 10.9*   < > 10.9* 9.5* 9.5*  HCT 34.7*   < > 34.8*   < > 36.7* 28.0* 28.0*  MCV 90.6   < > 98.9  --  100.5*  --   --   PLT  281   < > 147*  --  180  --   --    < > = values in this interval not displayed.      

## 2018-06-27 NOTE — Progress Notes (Addendum)
NAME:  Alexander Whitaker, MRN:  [id number], DOB:  Apr 13, 1950, LOS: 3 ADMISSION DATE:  06/24/2018, CONSULTATION DATE:  3/29/202 REFERRING MD:  Oval Linsey, CHIEF COMPLAINT:  AMS/resp failure   Brief History   tx from [place] for AMS/resp failure  History of present illness   68 year old male with history of esrd on hd, cva, htn, chf, presented to [place] with ams.  He had fever of 101.6 at [facility] and only responsive to deep stimuli.  He was intubated.  Cxr showed diffuse infiltrates. Blood sugar of 638.  statted on isulin gtt.   He was given vanc, ceft, azithro.  He was tx today for icu management  Past Medical History  esrd on hd cva  htn chf  Significant Hospital Events   ett 06/23/18 Femoral aline 06/24/18 RIJ 06/24/18  Consults:  Nephrology   Procedures:  ett 06/23/18 Femoral aline 06/24/18 RIJ 06/24/18  Significant Diagnostic Tests:  cxr 06/23/18 with diffuse infiltrates CXR 3/30- bilateral opacities, personally reviewed   Micro Data:  Sputum cx 06/24/18 Blood cx 06/24/18  MRSA PCR 3/29 negative  Antimicrobials:  vanc 06/23/18- 3/31 ceft 06/23/18 azithro 06/23/18   Interim history/subjective:  No events overnight FiO2 down to 40 and PEEP 5  Objective   Blood pressure (!) 45/11, pulse 83, temperature 99.6 F (37.6 C), temperature source Oral, resp. rate 20, height $RemoveBe'5\' 5"'prHgicPiD$  (1.651 m), weight 69.3 kg, SpO2 100 %.    Vent Mode: PRVC FiO2 (%):  [30 %-40 %] 30 % Set Rate:  [20 bmp-24 bmp] 24 bmp Vt Set:  [500 mL] 500 mL PEEP:  [5 cmH20] 5 cmH20 Plateau Pressure:  [13 cmH20-17 cmH20] 16 cmH20   Intake/Output Summary (Last 24 hours) at 06/27/2018 1044 Last data filed at 06/27/2018 0900 Gross per 24 hour  Intake 1551.59 ml  Output 1651 ml  Net -99.41 ml   Filed Weights   06/25/18 0500 06/26/18 0500 06/27/18 0445  Weight: 73.8 kg 71.6 kg 69.3 kg    Examination: General: Acutely ill appearing male, NAD HENT: [place]/AT, PERRL, EOM-I and MMM Lungs: Coarse BS diffusely  Cardiovascular: RRR, Nl S1/S2 and -M/R/G Abdomen: Soft, NT, ND and +BS Extremities: -edema and -tenderness Neuro: Sedate, opens eyes to command, moving everything to pain Skin: clean, dry, warm. Scattered scabbed abrasions on BUE. No rash   Resolved Hospital Problem list     Assessment & Plan:    Acute Respiratory Failure with hypoxia ?CAP- wbc 21.  cxr with diffuse infiltartes.  Fevers.   Pulmonary Edema P Continue azithromyxin/ceftriaone vancomycin is been discontinued questionable whether start Plaquenil -Monitor culture data -Remains on isolation -Suspected COVID-19 testing was performed in outside hospital no results expected for 9 days.  Globin nightly was reordered for 03/2018 -Pulmonary hygiene -No work-up assessment this time -Serial chest x-ray   SVT requiring amiodarone and chest compressions on 06/27/2018.  Last 2D echo 10/2017 showed EF of 60 to 65% he is a known history of congestive heart failure, grade 1 diastolic dysfunction was noted per Dr. Radford Pax. -Amnio bolus and drip in place -Metroprolol IV x1 -Twelve-lead EKG -Trend troponins -Cardiology consult called on 06/27/2018   Septic shock- likely secondary to cap Aspect of hypotension related to propofol  P -Pressors as needed to maintain mean arterial pressure greater than 65. -Discontinue Levophed and substitute Neo-Synephrine as needed to maintain map goal -Judicious use of sedation -Continue Rocephin and Zithromax for now.  Stop day 3 in place - Rocephin and zithromax to be continued  for now, will place stop date at 5 and 7 days -Continue to follow culture data -COVID-19 reordered on 06/26/2017  HONK, likely in setting of septic shock, resolved  DM -no longer on insulin gtt P -Sliding scale insulin:   End-stage renal disease.   -compliant with iHD but given pressor requirement inpatient, is on CRRT  P -Nephrology is following appreciate recommendations -06/27/2018 no dialysis planned for today   Hypothyroidism -Currently on Synthroid  GERD  -Currently on proton  06/27/2018 status post brief CPR for wide-complex tachycardia required less than 1 minute of compressions treated with amiodarone and metoprolol.  Cardiology consult was called  Best practice:  Diet: NPO  Pain/Anxiety/Delirium protocol (if indicated): Fentanyl gtt, PRN fent  VAP protocol (if indicated): continue  DVT prophylaxis: SQ Heparin  GI prophylaxis: protonix Glucose control: SSI Mobility: bedrest  Code Status: full code  Family Communication: 06/27/2018 no family at bedside Disposition: Continue ICU level of care   Labs   CBC: Recent Labs  Lab 06/24/18 0639 06/25/18 0443 06/26/18 0519 06/27/18 0320 06/27/18 0437 06/27/18 0738  WBC 23.4* 18.9* 11.8*  --  13.9*  --   NEUTROABS 19.3*  --   --   --   --   --   HGB 11.3* 10.7* 10.9* 10.2* 10.9* 9.5*  HCT 34.7* 33.3* 34.8* 30.0* 36.7* 28.0*  MCV 90.6 93.3 98.9  --  100.5*  --   PLT 281 208 147*  --  180  --     Basic Metabolic Panel: Recent Labs  Lab 06/25/18 0443 06/25/18 1442 06/26/18 0519 06/26/18 2153 06/27/18 0320 06/27/18 0437 06/27/18 0738  NA 137 136 138 136 143 139 142  K 3.9 4.4 4.3 3.9 3.9 4.1 3.7  CL 101 104 103 105  --  103  --   CO2 21* 24 23 19*  --  15*  --   GLUCOSE 143* 152* 131* 109*  --  157*  --   BUN 37* 25* 17 23  --  26*  --   CREATININE 4.45* 3.43* 2.86* 3.20*  --  4.26*  --   CALCIUM 7.5* 7.5* 7.8* 7.8*  --  8.1*  --   MG 1.9  --  2.3 2.5*  --  2.6*  --   PHOS 2.5 2.8 3.8  --   --  3.9  --    GFR: Estimated Creatinine Clearance: 14.2 mL/min (A) (by C-G formula based on SCr of 4.26 mg/dL (H)). Recent Labs  Lab 06/24/18 0639 06/24/18 0650 06/24/18 0803 06/25/18 0443 06/26/18 0519 06/27/18 0437  WBC 23.4*  --   --  18.9* 11.8* 13.9*  LATICACIDVEN  --  1.9 1.6 1.4  --   --     Liver Function Tests: Recent Labs  Lab 06/24/18 0639 06/25/18 0443 06/25/18 1442 06/26/18 0519  AST 18  --   --   --   ALT  11  --   --   --   ALKPHOS 83  --   --   --   BILITOT 0.7  --   --   --   PROT 4.9*  --   --   --   ALBUMIN 2.5* 2.5* 2.5* 2.8*   No results for input(s): LIPASE, AMYLASE in the last 168 hours. No results for input(s): AMMONIA in the last 168 hours.  ABG    Component Value Date/Time   PHART 7.384 06/27/2018 0738   PCO2ART 34.1 06/27/2018 0738   PO2ART 91.0 06/27/2018 0738  HCO3 20.2 06/27/2018 0738   TCO2 21 (L) 06/27/2018 0738   ACIDBASEDEF 4.0 (H) 06/27/2018 0738   O2SAT 97.0 06/27/2018 0738     Coagulation Profile: No results for input(s): INR, PROTIME in the last 168 hours.  Cardiac Enzymes: Recent Labs  Lab 06/26/18 2153 06/27/18 0437  TROPONINI 2.05* 2.65*    HbA1C: Hgb A1c MFr Bld  Date/Time Value Ref Range Status  11/21/2017 07:05 PM 9.1 (H) 4.8 - 5.6 % Final    Comment:    (NOTE) Pre diabetes:          5.7%-6.4% Diabetes:              >6.4% Glycemic control for   <7.0% adults with diabetes   03/11/2016 04:12 AM 6.7 (H) 4.8 - 5.6 % Final    Comment:    (NOTE)         Pre-diabetes: 5.7 - 6.4         Diabetes: >6.4         Glycemic control for adults with diabetes: <7.0    CBG: Recent Labs  Lab 06/26/18 1557 06/26/18 2010 06/27/18 0000 06/27/18 0432 06/27/18 0736  GLUCAP 209* 81 128* 155* 136*   App cct 60 min  Richardson Landry Minor ACNP Maryanna Shape PCCM Pager [phone] till 1 pm If no answer page [phone] 06/27/2018, 10:44 AM  Attending Note:  68 year old male with ESRD who skipped dialysis and presented to [place] in acute respiratory failure due to pulmonary edema.  There was a question of exposure to COVID but that returned negative this AM.  This AM also, patient developed VT and a code was called and the patient was shocked and resuscitated with ROSC after 5 minutes of CPR.  COVID resulted negative after code.  On exam, he is now unresponsive with coarse BS diffusely.  I reviewed CXR myself, ETT is in a good position.  Discussed with PCCM-NP.   Will transfer to 28M since negative.  HD in AM.  D/C pressors.  Turn off sedation to evaluate mental status.  PCCM will continue to manage.  The patient is critically ill with multiple organ systems failure and requires high complexity decision making for assessment and support, frequent evaluation and titration of therapies, application of advanced monitoring technologies and extensive interpretation of multiple databases.   Critical Care Time devoted to patient care services described in this note is  90  Minutes. This time reflects time of care of this signee Dr Jennet Maduro. This critical care time does not reflect procedure time, or teaching time or supervisory time of PA/NP/Med student/Med Resident etc but could involve care discussion time.  Rush Farmer, M.D. [facility] Pulmonary/Critical Care Medicine. Pager: [phone]. After hours pager: [phone].

## 2018-06-27 NOTE — Progress Notes (Signed)
   06/27/18 0950  Clinical Encounter Type  Visited With Health care provider  Visit Type Initial;Code  Referral From Nurse  Consult/Referral To Chaplain  The chaplain responded to Pt. code blue.  The chaplain entered the unit and identified her pastoral presence  with the staff and AC-Ron.  The chaplain remained outside the Pt. room.  The chaplain witnessed the [facility] call the Pt. family.  The chaplain confirmed her exit with the medical team. The chaplain is available for F/U spiritual care as needed.

## 2018-06-28 ENCOUNTER — Inpatient Hospital Stay (HOSPITAL_COMMUNITY): Payer: Medicare HMO

## 2018-06-28 ENCOUNTER — Encounter (HOSPITAL_COMMUNITY): Payer: Self-pay | Admitting: Cardiovascular Disease

## 2018-06-28 DIAGNOSIS — R9431 Abnormal electrocardiogram [ECG] [EKG]: Secondary | ICD-10-CM

## 2018-06-28 DIAGNOSIS — I469 Cardiac arrest, cause unspecified: Secondary | ICD-10-CM

## 2018-06-28 DIAGNOSIS — I472 Ventricular tachycardia: Secondary | ICD-10-CM

## 2018-06-28 DIAGNOSIS — I4901 Ventricular fibrillation: Secondary | ICD-10-CM

## 2018-06-28 LAB — CBC WITH DIFFERENTIAL/PLATELET
Abs Immature Granulocytes: 0.11 10*3/uL — ABNORMAL HIGH (ref 0.00–0.07)
Basophils Absolute: 0 10*3/uL (ref 0.0–0.1)
Basophils Relative: 0 %
Eosinophils Absolute: 0.2 10*3/uL (ref 0.0–0.5)
Eosinophils Relative: 2 %
HCT: 30.4 % — ABNORMAL LOW (ref 39.0–52.0)
Hemoglobin: 9 g/dL — ABNORMAL LOW (ref 13.0–17.0)
Immature Granulocytes: 1 %
Lymphocytes Relative: 12 %
Lymphs Abs: 1.2 10*3/uL (ref 0.7–4.0)
MCH: 29.6 pg (ref 26.0–34.0)
MCHC: 29.6 g/dL — ABNORMAL LOW (ref 30.0–36.0)
MCV: 100 fL (ref 80.0–100.0)
Monocytes Absolute: 0.9 10*3/uL (ref 0.1–1.0)
Monocytes Relative: 9 %
Neutro Abs: 7.7 10*3/uL (ref 1.7–7.7)
Neutrophils Relative %: 76 %
Platelets: 158 10*3/uL (ref 150–400)
RBC: 3.04 MIL/uL — ABNORMAL LOW (ref 4.22–5.81)
RDW: 15.6 % — ABNORMAL HIGH (ref 11.5–15.5)
WBC: 10.1 10*3/uL (ref 4.0–10.5)
nRBC: 0 % (ref 0.0–0.2)

## 2018-06-28 LAB — GLUCOSE, CAPILLARY
Glucose-Capillary: 122 mg/dL — ABNORMAL HIGH (ref 70–99)
Glucose-Capillary: 136 mg/dL — ABNORMAL HIGH (ref 70–99)
Glucose-Capillary: 125 mg/dL — ABNORMAL HIGH (ref 70–99)
Glucose-Capillary: 164 mg/dL — ABNORMAL HIGH (ref 70–99)

## 2018-06-28 LAB — ECHOCARDIOGRAM LIMITED
Height: 65 in
Weight: 2500.9 [oz_av]

## 2018-06-28 LAB — BASIC METABOLIC PANEL
Anion gap: 19 — ABNORMAL HIGH (ref 5–15)
BUN: 34 mg/dL — ABNORMAL HIGH (ref 8–23)
CO2: 17 mmol/L — ABNORMAL LOW (ref 22–32)
Calcium: 7.7 mg/dL — ABNORMAL LOW (ref 8.9–10.3)
Chloride: 107 mmol/L (ref 98–111)
Creatinine, Ser: 6.09 mg/dL — ABNORMAL HIGH (ref 0.61–1.24)
GFR calc Af Amer: 10 mL/min — ABNORMAL LOW (ref >60)
GFR calc non Af Amer: 9 mL/min — ABNORMAL LOW (ref >60)
Glucose, Bld: 151 mg/dL — ABNORMAL HIGH (ref 70–99)
Potassium: 4.1 mmol/L (ref 3.5–5.1)
Sodium: 143 mmol/L (ref 135–145)

## 2018-06-28 LAB — MAGNESIUM: Magnesium: 2.5 mg/dL — ABNORMAL HIGH (ref 1.7–2.4)

## 2018-06-28 LAB — PHOSPHORUS: Phosphorus: 4.7 mg/dL — ABNORMAL HIGH (ref 2.5–4.6)

## 2018-06-28 MED ORDER — CHLORHEXIDINE GLUCONATE 0.12 % MT SOLN
OROMUCOSAL | Status: AC
  Administered 2018-06-28: 15 mL via OROMUCOSAL
  Filled 2018-06-28: qty 30

## 2018-06-28 MED ORDER — CHLORHEXIDINE GLUCONATE CLOTH 2 % EX PADS
6.0000 | MEDICATED_PAD | Freq: Every day | CUTANEOUS | Status: DC
  Administered 2018-06-30: 6 via TOPICAL

## 2018-06-28 MED ORDER — ACETAMINOPHEN 325 MG PO TABS: 650.0000 mg | ORAL_TABLET | Freq: Four times a day (QID) | ORAL | Status: DC | PRN

## 2018-06-28 MED ORDER — PANTOPRAZOLE SODIUM 40 MG PO PACK
40.0000 mg | PACK | ORAL | Status: DC
  Administered 2018-06-29 – 2018-06-30 (×2): 40 mg
  Filled 2018-06-28 (×3): qty 20

## 2018-06-28 MED ORDER — SODIUM CHLORIDE 0.9 % IV BOLUS
250.0000 mL | INTRAVENOUS | Status: DC | PRN
  Administered 2018-06-28 (×2): 250 mL via INTRAVENOUS

## 2018-06-28 NOTE — Progress Notes (Signed)
  Echocardiogram 2D Echocardiogram limited has been performed due to contact precautions.  Alexander Whitaker 06/28/2018, 9:06 AM

## 2018-06-28 NOTE — Progress Notes (Signed)
MD verbal order that it is ok to give patient 250 ml bolus of normal saline for hypotension times 3 as needed. Will order and administer as needed.  Lucius Conn, RN

## 2018-06-28 NOTE — Plan of Care (Signed)
Pt has weaned off Neo today after receiving 2 x 250 ml bolus of NS slow per Dr. Jonnie Finner. Patient has maintained a Rass of -1 on 150 mcg/hr of Fentanyl. Pt responds to voice and is following commands. Pt is staying calm and resting comfortably listening to country music in bed. Tube feeds have been restarted at a trickle rate per MD and arterial line has been removed. Will continue to monitor closely.  Lucius Conn, RN

## 2018-06-28 NOTE — Progress Notes (Addendum)
Progress Note  Patient Name: Alexander Whitaker Date of Encounter: 06/28/2018  Primary Cardiologist: Glenetta Hew, MD   Subjective   Yesterday's events included transferred from 2 AM to [facility] after COVID-19 screen was negative.  This was actually after cardiac catheterization revealing no significant CAD.  Remains intubated.  Still has sedation going but trying to wean off sedation.  Does better with continued playing in the background.   Still has very labile blood pressures and is on very minimal dose of Neo-Synephrine.  Had one short bursts of NSVT, but no prolonged tachycardia or VT. Amiodarone discontinued this morning because of prolonged QT  Inpatient Medications    Scheduled Meds: . aspirin  81 mg Per Tube Daily  . chlorhexidine gluconate (MEDLINE KIT)  15 mL Mouth Rinse BID  . Chlorhexidine Gluconate Cloth  6 each Topical Daily  . clonazePAM  1 mg Per Tube Q8H  . feeding supplement (PRO-STAT SUGAR FREE 64)  30 mL Per Tube QID  . feeding supplement (VITAL 1.5 CAL)  1,000 mL Per Tube Q24H  . heparin  5,000 Units Subcutaneous Q8H  . insulin aspart  0-24 Units Subcutaneous Q4H  . levothyroxine  50 mcg Per Tube Q0600  . mouth rinse  15 mL Mouth Rinse 10 times per day  . pantoprazole (PROTONIX) IV  40 mg Intravenous Q24H  . sodium chloride flush  10-40 mL Intracatheter Q12H  . sodium chloride flush  3 mL Intravenous Q12H   Continuous Infusions: . sodium chloride    . amiodarone 30 mg/hr (06/28/18 0700)  . cefTRIAXone (ROCEPHIN)  IV Stopped (06/27/18 2154)  . fentaNYL infusion INTRAVENOUS 225 mcg/hr (06/28/18 0700)  . phenylephrine (NEO-SYNEPHRINE) Adult infusion 55 mcg/min (06/28/18 0700)   PRN Meds: sodium chloride, acetaminophen, docusate, fentaNYL, midazolam, midazolam, ondansetron (ZOFRAN) IV, sodium chloride flush, sodium chloride flush   Vital Signs    Vitals:   06/28/18 0645 06/28/18 0700 06/28/18 0715 06/28/18 0737  BP:  (!) 141/59    Pulse: 65 (!) 52 (!) 50    Resp: (!) _0 Temp:    99.8 F (37.7 C)  TempSrc:    Oral  SpO2: 100% 100% 100%   Weight:      Height:        Intake/Output Summary (Last 24 hours) at 06/28/2018 0803 Last data filed at 06/28/2018 0700 Gross per 24 hour  Intake 3192.48 ml  Output 525 ml  Net 2667.48 ml   Last 3 Weights 06/28/2018 06/27/2018 06/27/2018  Weight (lbs) 156 lb 4.9 oz 151 lb 14.4 oz 152 lb 12.5 oz  Weight (kg) 70.9 kg 68.9 kg 69.3 kg      Telemetry    Mostly sinus rhythm.  One short bursts of NSVT- Personally Reviewed  ECG    Post-cath EKG on April 1: Sinus rhythm with PACs.  LVH with repolarization abnormality.  Borderline Q waves in inferior leads.  Biphasic ST-T waves in V2 through V4 with T wave inversion.  Minimal T wave inversions in V6 as well as aVL.  Sinus bradycardia, rate 50 bpm.  Left axis deviation/LAFB (-46 degrees).  Poor R wave progression in precordial leads--cannot exclude septal infarct, age indeterminate.  T wave inversion is now only noted in V5 and V6 as well as aVL and I.  Also cannot exclude inferior MI, age undetermined.- Personally Reviewed  Physical Exam   GEN:  Chronically ill-appearing gentleman intubated in bed.  Is sedated, but moving some. Neck: No JVD Cardiac:  RRR, normal S1 and S2.  No murmurs, rubs, or gallops.  Respiratory:  Diffuse scattered crackles and rhonchi.  No obvious wheezing. GI: Soft, nontender, non-distended.  NABS. MS: No edema; No deformity.  Moves all extremities when agitated. Neuropsych:   Intubated and sedated.  Very agitated with weaning of sedation.  Labs    Chemistry Recent Labs  Lab 06/24/18 [phone] 06/25/18 0443 06/25/18 1442 06/26/18 0519 06/26/18 2153  06/27/18 0437 06/27/18 0738 06/27/18 1102 06/28/18 0459  NA 135 137 136 138 136   < > 139 142 143 143  K 3.9 3.9 4.4 4.3 3.9   < > 4.1 3.7 3.6 4.1  CL 99 101 104 103 105  --  103  --   --  107  CO2 21* 21* 24 23 19*  --  15*  --   --  17*  GLUCOSE 213* 143* 152* 131* 109*  --   157*  --   --  151*  BUN 53* 37* 25* 17 23  --  26*  --   --  34*  CREATININE 5.94* 4.45* 3.43* 2.86* 3.20*  --  4.26*  --   --  6.09*  CALCIUM 7.4* 7.5* 7.5* 7.8* 7.8*  --  8.1*  --   --  7.7*  PROT 4.9*  --   --   --   --   --   --   --   --   --   ALBUMIN 2.5* 2.5* 2.5* 2.8*  --   --   --   --   --   --   AST 18  --   --   --   --   --   --   --   --   --   ALT 11  --   --   --   --   --   --   --   --   --   ALKPHOS 83  --   --   --   --   --   --   --   --   --   BILITOT 0.7  --   --   --   --   --   --   --   --   --   GFRNONAA 9* 13* 17* 21* 19*  --  13*  --   --  9*  GFRAA 10* 15* 20* 25* 22*  --  15*  --   --  10*  ANIONGAP _0 --  21*  --   --  19*   < > = values in this interval not displayed.     Hematology Recent Labs  Lab 06/26/18 0519  06/27/18 0437 06/27/18 0738 06/27/18 1102 06/28/18 0459  WBC 11.8*  --  13.9*  --   --  10.1  RBC 3.52*  --  3.65*  --   --  3.04*  HGB 10.9*   < > 10.9* 9.5* 9.5* 9.0*  HCT 34.8*   < > 36.7* 28.0* 28.0* 30.4*  MCV 98.9  --  100.5*  --   --  100.0  MCH 31.0  --  29.9  --   --  29.6  MCHC 31.3  --  29.7*  --   --  29.6*  RDW 15.4  --  15.1  --   --  15.6*  PLT 147*  --  180  --   --  158   < > =  values in this interval not displayed.    Cardiac Enzymes Recent Labs  Lab 06/26/18 2153 06/27/18 0437 06/27/18 1012  TROPONINI 2.05* 2.65* 2.75*   No results for input(s): TROPIPOC in the last 168 hours.   BNPNo results for input(s): BNP, PROBNP in the last 168 hours.   DDimer No results for input(s): DDIMER in the last 168 hours.   Radiology    Dg Abd 1 View  Result Date: 06/27/2018 CLINICAL DATA:  Respiratory failure, abdominal distension EXAM: ABDOMEN - 1 VIEW COMPARISON:  None. FINDINGS: Endotracheal tube with tip in the gastric body. Side port just below the GE junction. No dilated to large or small bowel. Posterior lumbar fusion. IMPRESSION: 1. No evidence of bowel obstruction. 2. NG tube in stomach.  Electronically Signed   By: Suzy Bouchard M.D.   On: 06/27/2018 07:37   Dg Chest Port 1 View  Result Date: 06/28/2018 CLINICAL DATA:  68 year old male intubated with respiratory failure EXAM: PORTABLE CHEST 1 VIEW COMPARISON:  Prior chest x-ray 06/27/2018 FINDINGS: The tip of the endotracheal tube is 4.7 cm above the carina. A nasogastric tube is present. The tip lies off the field of view, presumably within the stomach. Stable position of right IJ non tunneled hemodialysis catheter with the tip overlying the mid ventricle. No new support apparatus. The external defibrillator pads have been removed. Stable mild cardiomegaly and pulmonary vascular congestion bordering on mild interstitial edema. No significant interval change in the pulmonary parenchymal pattern. Suspect layering right pleural effusion. No pneumothorax. No new airspace opacities. No acute osseous abnormality. IMPRESSION: No significant interval change in the appearance of the chest over the last 24 hours. Persistent cardiomegaly and mild pulmonary edema with a probable small layering right pleural effusion. Stable and satisfactory support apparatus. Electronically Signed   By: Jacqulynn Cadet M.D.   On: 06/28/2018 07:46   Dg Chest Port 1 View  Result Date: 06/27/2018 CLINICAL DATA:  Respiratory failure EXAM: PORTABLE CHEST 1 VIEW COMPARISON:  06/26/2018 FINDINGS: Support devices are stable. Mild cardiomegaly and vascular congestion. Slight right base atelectasis, improving since prior study. No confluent opacity on the left. No effusions or acute bony abnormality. IMPRESSION: Mild cardiomegaly.  Improving right base atelectasis. Electronically Signed   By: Rolm Baptise M.D.   On: 06/27/2018 07:33    Cardiac Studies   Cath: 06/27/2018:   Prox RCA lesion is 30% stenosed.  Mid RCA-1 lesion is 20% stenosed.  Mid RCA-2 lesion is 30% stenosed.  Ost LM to Mid LM lesion is 5% stenosed.  Mid LAD lesion is 10% stenosed.  There is  moderate left ventricular systolic dysfunction. -- EF estimated 40% with diffuse hypocontractility.  LVEDP 13 mm   2D echo 06/28/2018:   Limited study. LVEF 60-65%, mild LVH, no obvious regional wall motion abnormalities.  The left ventricle has normal systolic function, with an ejection fraction of 60-65%. The cavity size was normal. There is mildly increased left ventricular wall thickness. No evidence of left ventricular regional wall motion abnormalities.   Patient Profile     68 y.o. male with a hx of DM2, HTN, ESRD on HD, GERD, hypothyroid, D-CHF, CVA, who was seen for the evaluation of cardiac arrest. Underwent cardiac cath noted above.   Assessment & Plan    1. Cardiac arrest: Had an episode yesterday which looked consistent with polymorphic VT/torsades than simple VT degenerating into VF. Underwent cardiac cath noted above with moderate but non-obstructive CAD noted. EF reported at 45%, likely stress  induced cardiomyopathy.  -- Was continued on IV amiodarone overnight but QT interval this morning now >500. Will stop for now. Remains on neo gtt. Unable to add low dose BB at this time.   Will need further evaluation with EP consult this admission.  2. NSTEMI is less likely - more c/w Demand Ischemia with severe tachycardia. Troponin peaking at 2.75. -- Echo pending  3. HL: Given CAD noted on cath, LDL 99, will need to add statin therapy.  4. ESRD on HD: nephrology following. Did not undergo CRRT yesterday.   5. ARF in setting of HCAP/sepsis/fever: remains intubated this morning. -- antibiotics per primary, Azithro held this am in the setting of prolonged QTc -- COVID neg --> still having a hard time weaning off of the ventilator because of oxygenation issues.  For questions or updates, please contact [facility] Please consult [email or web address] for contact info under        Scribed Note Template by Reino Bellis, NP  06/28/2018, 8:03 AM     ATTENDING ATTESTATION  I  have seen, examined and evaluated the patient this AM and discussed with Reino Bellis, NP (who is assisting from home based on COVID-19 restrictions).  After personally seeing and evaluating the patient, and reviewing all the available data and chart, we discussed the patients laboratory, study & physical findings as well as symptoms in detail.  The above physical examination, telemetry evaluation, and assessment and plan was performed by the undersigned.    Glenetta Hew, M.D., M.S. Interventional Cardiologist   Pager # [phone] Phone # [phone] [address]. [place], [place] [address]

## 2018-06-28 NOTE — Consult Note (Signed)
Cardiology Consultation:   Patient ID: Alexander Whitaker MRN: [id number]; DOB: 1950-11-04  Admit date: 06/24/2018 Date of Consult: 06/28/2018  Primary Care Provider: [name], MD Primary Cardiologist: [facility] Primary Electrophysiologist:  None   Patient Profile:   Alexander Whitaker is a 68 y.o. male with a hx of HTN, DM, ESRF on HD,GERD, chronic CHF (diastolic), COPD, stroke who is being seen today for the evaluation of VT arrest at the request of Dr. Ellyn Hack.  History of Present Illness:   Alexander Whitaker HPI is obtained from the chart  He presented initially to [facility] 2/2 AMS< fever, chart reports 101.6 at [place] and only responsive to deep stimuli.  He was intubated.  Cxr showed diffuse infiltrates. Blood sugar of 638.  statted on isulin gtt.   He was given vanc, ceft, azithro and transferred to [facility] for further management.  Admitted with  Respiratory failure Septic shock suspect 2/2 CAP, pulmonary edema As per CCM note yesterday: COVID NEGATIVE (done at outside facility, and taken off precautions) I do not see a result.  It seems like a COVID test was done yesterday by note, I do not see a result, but CCM note reports a COVID test negative post code (?) HONK (hyperosmolar nonketotic) DM  Cardiology consult nicely laid out timeline Tx to [facility] 03/29. HD per [place].  03/31, SBP 190 and HR 180 during wean trial. Prev settings renewed, pt given Amio 150 mg and metoprolol 5 mg IV. HR improved. HR increased again w/ vomiting, metoprolol 2.5 mg IV but BP dropped and Levo restarted.  03/31, After HR slowed, ECG ordered and ?ischemia>>ASA 81 mg qd and troponins ordered>>elevated w/ peak 2.75.   04/01, 3 am, pt went into Afib, RVR, Amio IV started. Ph 7.246 on ABG>>bicarb also 04/01 after 10 am, pt became tachycardic w/ no stimulus, possible torsades vs VT>>possible coarse VF. Lost pulses, got compressions for a brief time and defib x 1, ROSC. Never got Epi. Cards was  asked to see.  Pt is intubated and sedated, though agitated at times on the vent.   LHC yesterday without obstructive CAD, LVEF 40%  EP is asked to weigh on rhythm/recommendations  LABS (4/1) day of event K+ 4.1 > 3.7 > 3.6 Mag 2.6 BUN/Creat 26/4.26 Trop I: 2.65 > 2.75 WBC 13.9 H/H 10/36 Plts 180  ABG PH 7.456 PCO2 34.3 PO2 496 HCO3 24   Today's lytes K+ 4.1 Mag 2.5    He has been on/off pressor, now off amiodarone,  2/2 marked QT prolongation on EKG today I have reviewed med list with [place] for other potential contributors.  None currently (klonopin not likely to cause) though was on zithromax through yesterday    Past Medical History:  Diagnosis Date  . Anemia   . Arthritis    per pt (11/21/2017)  . CHF (congestive heart failure) ([place]) ~ 2017  . CKD stage 3 due to type 2 diabetes mellitus ([place])    stage 5 Dialysis T/Th/Sa  . CVA (cerebral vascular accident) [facility]) 2007   wife denies residual on 11/21/2017  . Diabetic peripheral neuropathy ([place])   . Dyspnea   . ESRD (end stage renal disease) on dialysis ([place])    "[place]; TTS" (11/21/2017)  . GERD (gastroesophageal reflux disease)    has in the past  . Headache    since going on dialysis  . Hypertension   . Hypothyroidism   . PONV (postoperative nausea and vomiting)   . Restless legs   .  Type II diabetes mellitus ([place])     Past Surgical History:  Procedure Laterality Date  . AV FISTULA PLACEMENT Right 10/07/2016   Procedure: RIGHT BRACHIOCEPHALIC ARTERIOVENOUS (AV) FISTULA CREATION;  Surgeon: Angelia Mould, MD;  Location: [place];  Service: Vascular;  Laterality: Right;  . BACK SURGERY    . CATARACT EXTRACTION W/ INTRAOCULAR LENS IMPLANT Left   . CIRCUMCISION  2015  . HERNIA REPAIR  2016   "stomach"  . LEFT HEART CATH AND CORONARY ANGIOGRAPHY N/A 06/27/2018   Procedure: LEFT HEART CATH AND CORONARY ANGIOGRAPHY;  Surgeon: Troy Sine, MD;  Location: [place] CV [facility];  Service: Cardiovascular;   Laterality: N/A;  . POSTERIOR LUMBAR FUSION  1990s?   "has bolts and rods in there"     Home Medications:  Prior to Admission medications   Medication Sig Start Date End Date Taking? Authorizing Provider  albuterol (PROVENTIL HFA;VENTOLIN HFA) 108 (90 Base) MCG/ACT inhaler Inhale 2 puffs into the lungs every 6 (six) hours as needed for wheezing or shortness of breath.   Yes [provider]  amLODipine (NORVASC) 10 MG tablet Take 1 tablet (10 mg total) by mouth daily. 11/25/17  Yes Patrecia Pour, Christean Grief, MD  camphor-menthol [facility]) lotion Apply 1 application topically as needed for itching.   Yes [provider]  clonazePAM (KLONOPIN) 2 MG tablet Take 2 mg by mouth 3 (three) times daily as needed for anxiety.   Yes [provider]  diclofenac sodium (VOLTAREN) 1 % GEL Apply 2 g topically 3 (three) times daily as needed for pain. knees 05/29/18  Yes [provider]  escitalopram (LEXAPRO) 10 MG tablet Take 10 mg by mouth daily. 06/07/18  Yes [provider]  famotidine (PEPCID) 40 MG tablet Take 40 mg by mouth daily.   Yes [provider]  gabapentin (NEURONTIN) 300 MG capsule Take 300 mg by mouth 2 (two) times daily.   Yes [provider]  hydrOXYzine (ATARAX/VISTARIL) 25 MG tablet Take 25 mg by mouth 2 (two) times daily as needed for itching.    Yes [provider]  Insulin Detemir (LEVEMIR) 100 UNIT/ML Pen Inject 10 Units into the skin daily at 12 noon. Patient taking differently: Inject 50 Units into the skin daily at 12 noon.  10/07/16  Yes Alvia Grove, PA-C  levothyroxine (SYNTHROID, LEVOTHROID) 50 MCG tablet Take 50 mcg by mouth daily before breakfast.  09/19/16  Yes [provider]  lidocaine-prilocaine (EMLA) cream Apply 1 application topically See admin instructions. Apply small amount to access site 1 to 2 hours before dialysis. 11/13/17  Yes [provider]  loratadine (CLARITIN) 10 MG tablet Take  10 mg by mouth daily as needed for rhinitis.    Yes [provider]  Melatonin 10 MG CAPS Take 10 mg by mouth at bedtime as needed (sleep).    Yes [provider]  ondansetron (ZOFRAN) 4 MG tablet Take 4 mg by mouth 4 (four) times daily as needed for nausea/vomiting. 06/17/18  Yes [provider]  pantoprazole (PROTONIX) 40 MG tablet Take 40 mg by mouth daily.   Yes [provider]  pramipexole (MIRAPEX) 0.5 MG tablet Take 0.5 mg by mouth at bedtime as needed (sleep).   Yes [provider]    Inpatient Medications: Scheduled Meds: . aspirin  81 mg Per Tube Daily  . chlorhexidine gluconate (MEDLINE KIT)  15 mL Mouth Rinse BID  . Chlorhexidine Gluconate Cloth  6 each Topical Daily  .  clonazePAM  1 mg Per Tube Q8H  . feeding supplement (PRO-STAT SUGAR FREE 64)  30 mL Per Tube QID  . feeding supplement (VITAL 1.5 CAL)  1,000 mL Per Tube Q24H  . heparin  5,000 Units Subcutaneous Q8H  . insulin aspart  0-24 Units Subcutaneous Q4H  . levothyroxine  50 mcg Per Tube Q0600  . mouth rinse  15 mL Mouth Rinse 10 times per day  . pantoprazole (PROTONIX) IV  40 mg Intravenous Q24H  . sodium chloride flush  10-40 mL Intracatheter Q12H  . sodium chloride flush  3 mL Intravenous Q12H   Continuous Infusions: . sodium chloride    . amiodarone Stopped (06/28/18 0743)  . cefTRIAXone (ROCEPHIN)  IV Stopped (06/27/18 2154)  . fentaNYL infusion INTRAVENOUS 300 mcg/hr (06/28/18 0900)  . phenylephrine (NEO-SYNEPHRINE) Adult infusion 25 mcg/min (06/28/18 0907)   PRN Meds: sodium chloride, acetaminophen, docusate, fentaNYL, midazolam, midazolam, ondansetron (ZOFRAN) IV, sodium chloride flush, sodium chloride flush  Allergies:   No Known Allergies  Social History:   Social History   Socioeconomic History  . Marital status: Married    Spouse name: Not on file  . Number of children: Not on file  . Years of education: Not on file  . Highest education level: Not  on file  Occupational History  . Occupation: retired  Scientific laboratory technician  . Financial resource strain: Not on file  . Food insecurity:    Worry: Not on file    Inability: Not on file  . Transportation needs:    Medical: Not on file    Non-medical: Not on file  Tobacco Use  . Smoking status: Former Smoker    Packs/day: 1.00    Years: 57.00    Pack years: 57.00    Types: Cigarettes    Last attempt to quit: 03/28/2017    Years since quitting: 1.2  . Smokeless tobacco: Never Used  Substance and Sexual Activity  . Alcohol use: Not Currently  . Drug use: Never  . Sexual activity: Not Currently  Lifestyle  . Physical activity:    Days per week: Not on file    Minutes per session: Not on file  . Stress: Not on file  Relationships  . Social connections:    Talks on phone: Not on file    Gets together: Not on file    Attends religious service: Not on file    Active member of club or organization: Not on file    Attends meetings of clubs or organizations: Not on file    Relationship status: Not on file  . Intimate partner violence:    Fear of current or ex partner: Not on file    Emotionally abused: Not on file    Physically abused: Not on file    Forced sexual activity: Not on file  Other Topics Concern  . Not on file  Social History Narrative  . Not on file    Family History:   Family History  Problem Relation Age of Onset  . Leukemia Mother 56  . CVA Father 60     ROS:  Please see the history of present illness.  All other ROS reviewed and negative.     Physical Exam/Data:   Vitals:   06/28/18 0745 06/28/18 0800 06/28/18 0801 06/28/18 0900  BP:  (!) 146/51 (!) 140/45 (!) 133/48  Pulse: 79 67 61 65  Resp: 19 (!) 24 (!) 24 (!) 24  Temp:    99.8 F (37.7 C)  TempSrc:      SpO2: 100% 100% 100% 100%  Weight:      Height:        Intake/Output Summary (Last 24 hours) at 06/28/2018 1013 Last data filed at 06/28/2018 0900 Gross per 24 hour  Intake 3294.67 ml  Output  625 ml  Net 2669.67 ml   Last 3 Weights 06/28/2018 06/27/2018 06/27/2018  Weight (lbs) 156 lb 4.9 oz 151 lb 14.4 oz 152 lb 12.5 oz  Weight (kg) 70.9 kg 68.9 kg 69.3 kg     Body mass index is 26.01 kg/m.  General:  Ill appearing, intubated HEENT: ETT in place Cardiac:  RRR Lungs:  Respiratory failure, on vent  Neuro:  sedated Psych:  Sedated Due to COVID 19 risks and reduction of exposure, I felt that it was medically appropriate to not perform additional exam  EKG:  The EKG was personally reviewed and demonstrates:  Sinus rhythm with marked QT prolongation,  T wave changes also noted Telemetry:  Telemetry was personally reviewed and demonstrates:  Sinus rhythm with marked qt prolongation, NSVT,  Prior strips of torsades are also noted  Relevant CV Studies:   06/27/2018: LHC  Prox RCA lesion is 30% stenosed.  Mid RCA-1 lesion is 20% stenosed.  Mid RCA-2 lesion is 30% stenosed.  Ost LM to Mid LM lesion is 5% stenosed.  Mid LAD lesion is 10% stenosed.  There is moderate left ventricular systolic dysfunction.   Moderate LV dysfunction with an EF estimated 40% with diffuse hypocontractility.  LVEDP 13 mm.  Mild nonobstructive CAD with smooth 5% narrowing in a long large left main coronary artery which trifurcated into a large LAD, ramus intermediate, and left circumflex vessel.  There was mild 10% smooth narrowing in the distal LAD.    Large dominant RCA with 3020 and 30% proximal to mid and mid distal narrowings.  RECOMMENDATION: Medical therapy for nonischemic cardiomyopathy.  Present the patient continues to be on epinephrine, Neo-Synephrine, amiodarone.  Plan dialysis as scheduled tomorrow.   11/22/17: TTE Study Conclusions - Left ventricle: The cavity size was normal. Systolic function was   normal. The estimated ejection fraction was in the range of 60%   to 65%. Wall motion was normal; there were no regional wall   motion abnormalities. There was an increased relative    contribution of atrial contraction to ventricular filling.   Doppler parameters are consistent with abnormal left ventricular   relaxation (grade 1 diastolic dysfunction). Doppler parameters   are consistent with high ventricular filling pressure. - Aortic valve: Trileaflet; normal thickness, mildly calcified   leaflets. - Tricuspid valve: There was trivial regurgitation. - Pulmonic valve: There was trivial regurgitation. - Pulmonary arteries: PA peak pressure: 35 mm Hg (S).  Laboratory Data:  Chemistry Recent Labs  Lab 06/26/18 2153  06/27/18 0437 06/27/18 0738 06/27/18 1102 06/28/18 0459  NA 136   < > 139 142 143 143  K 3.9   < > 4.1 3.7 3.6 4.1  CL 105  --  103  --   --  107  CO2 19*  --  15*  --   --  17*  GLUCOSE 109*  --  157*  --   --  151*  BUN 23  --  26*  --   --  34*  CREATININE 3.20*  --  4.26*  --   --  6.09*  CALCIUM 7.8*  --  8.1*  --   --  7.7*  GFRNONAA 19*  --  13*  --   --  9*  GFRAA 22*  --  15*  --   --  10*  ANIONGAP 12  --  21*  --   --  19*   < > = values in this interval not displayed.    Recent Labs  Lab 06/24/18 0639 06/25/18 0443 06/25/18 1442 06/26/18 0519  PROT 4.9*  --   --   --   ALBUMIN 2.5* 2.5* 2.5* 2.8*  AST 18  --   --   --   ALT 11  --   --   --   ALKPHOS 83  --   --   --   BILITOT 0.7  --   --   --    Hematology Recent Labs  Lab 06/26/18 0519  06/27/18 0437 06/27/18 0738 06/27/18 1102 06/28/18 0459  WBC 11.8*  --  13.9*  --   --  10.1  RBC 3.52*  --  3.65*  --   --  3.04*  HGB 10.9*   < > 10.9* 9.5* 9.5* 9.0*  HCT 34.8*   < > 36.7* 28.0* 28.0* 30.4*  MCV 98.9  --  100.5*  --   --  100.0  MCH 31.0  --  29.9  --   --  29.6  MCHC 31.3  --  29.7*  --   --  29.6*  RDW 15.4  --  15.1  --   --  15.6*  PLT 147*  --  180  --   --  158   < > = values in this interval not displayed.   Cardiac Enzymes Recent Labs  Lab 06/26/18 2153 06/27/18 0437 06/27/18 1012  TROPONINI 2.05* 2.65* 2.75*   No results for input(s):  TROPIPOC in the last 168 hours.  BNPNo results for input(s): BNP, PROBNP in the last 168 hours.  DDimer No results for input(s): DDIMER in the last 168 hours.  Radiology/Studies:   Dg Chest Port 1 View Result Date: 06/28/2018 CLINICAL DATA:  68 year old male intubated with respiratory failure EXAM: PORTABLE CHEST 1 VIEW COMPARISON:  Prior chest x-ray 06/27/2018 FINDINGS: The tip of the endotracheal tube is 4.7 cm above the carina. A nasogastric tube is present. The tip lies off the field of view, presumably within the stomach. Stable position of right IJ non tunneled hemodialysis catheter with the tip overlying the mid ventricle. No new support apparatus. The external defibrillator pads have been removed. Stable mild cardiomegaly and pulmonary vascular congestion bordering on mild interstitial edema. No significant interval change in the pulmonary parenchymal pattern. Suspect layering right pleural effusion. No pneumothorax. No new airspace opacities. No acute osseous abnormality. IMPRESSION: No significant interval change in the appearance of the chest over the last 24 hours. Persistent cardiomegaly and mild pulmonary edema with a probable small layering right pleural effusion. Stable and satisfactory support apparatus. Electronically Signed   By: Jacqulynn Cadet M.D.   On: 06/28/2018 07:46   Dg Chest Port 1 View Result Date: 06/27/2018 CLINICAL DATA:  Respiratory failure EXAM: PORTABLE CHEST 1 VIEW COMPARISON:  06/26/2018 FINDINGS: Support devices are stable. Mild cardiomegaly and vascular congestion. Slight right base atelectasis, improving since prior study. No confluent opacity on the left. No effusions or acute bony abnormality. IMPRESSION: Mild cardiomegaly.  Improving right base atelectasis. Electronically Signed   By: Rolm Baptise M.D.   On: 06/27/2018 07:33     Dg Chest Port 1 View Result Date: 06/25/2018 CLINICAL DATA:  68 year old male with  acute respiratory failure EXAM: PORTABLE CHEST 1  VIEW COMPARISON:  Prior chest x-ray 06/24/2018 FINDINGS: The patient remains intubated. The tip of the endotracheal tube is 5.5 cm above the carina. A right IJ non tunneled hemodialysis catheter remains in stable position with the tip overlying the upper SVC. Stable enlargement of the cardiopericardial silhouette. Perihilar and bibasilar interstitial and airspace opacities are present along with pulmonary vascular congestion most consistent with pulmonary edema. Similar appearance of multifocal right-sided rib fractures. Atherosclerotic calcifications again noted in the transverse aorta. The upper abdominal bowel gas pattern is unremarkable. IMPRESSION: 1. The tip of the endotracheal tube is 5.5 cm above the carina. 2. The tip of the right IJ non tunneled hemodialysis catheter overlies the upper SVC. 3. Stable cardiomegaly and mild pulmonary edema. Electronically Signed   By: Jacqulynn Cadet M.D.   On: 06/25/2018 07:45     Assessment and Plan:   1. Prolonged QT, toradades In the setting of acute medical illness.  Possibly exacerbated by amiodarone with secondary qt prolongation     Required CPR and shock with ROSC after 64mn by notes     Off amiodarone this AM 2/2 marked QT prolongation     Was on Zithromax through yesterday, was stopped yesterday  06/26/2018 EKG noted to have prolonged, very abnormal T waves/QT as well, with EKGs yesterday (post arrest) that looked better Lytes have been OK  For questions or updates, please contact CHardtnerPlease consult [email or web address] for contact info under   Signed, RBaldwin Jamaica PA-C  06/28/2018 10:13 AM   I have seen, examined the patient, and reviewed the above assessment and plan.  Changes to above are made where necessary.  On exam, very ill appearing, intubated patient. Clinically stable now, though with torsades, marked qt prolongation earlier.  Episodes was likely exacerbated by amiodarone and possible recently administered azithromycin.   He does not have a clinical history of long qt.  Unable to obtain family history at this time.  Continue aggressive medical management of underlying medical illness.  Stop amiodarone and avoid QT prolonging agents.  Keep K >3.9 and Mg >1.9 We can consider isoprel if further torsades, though I am optimistic that qt prolongation will resolve.  Prognosis is guarded.  A high level of decision making was required for this encounter.   EP is available as needed.  Please call with questions.  Co Sign: JThompson Grayer MD 06/28/2018

## 2018-06-28 NOTE — Progress Notes (Signed)
NAME:  Alexander Whitaker, MRN:  [id number], DOB:  08/16/1950, LOS: 4 ADMISSION DATE:  06/24/2018, CONSULTATION DATE:  3/29/202 REFERRING MD:  Oval Linsey, CHIEF COMPLAINT:  AMS/resp failure   Brief History   tx from [facility] for AMS/resp failure  History of present illness   68 year old male with history of esrd on hd, cva, htn, chf, presented to [place] with ams.  He had fever of 101.6 at [place] and only responsive to deep stimuli.  He was intubated.  Cxr showed diffuse infiltrates. Blood sugar of 638.  statted on isulin gtt.   He was given vanc, ceft, azithro.  He was tx today for icu management  Past Medical History  esrd on hd cva  htn chf  Significant Hospital Events   ett 06/23/18 Femoral aline 06/24/18 RIJ 06/24/18  Consults:  Nephrology  Cardiology  Procedures:  ett 06/23/18 Femoral aline 06/24/18 RIJ 06/24/18  Significant Diagnostic Tests:  cxr 06/23/18 with diffuse infiltrates CXR 3/30- bilateral opacities, personally reviewed  Cardiac Cath 4/1- nonobstructive CAD  Micro Data:  Sputum cx 06/24/18 Blood cx 06/24/18  MRSA PCR 3/29 negative  Antimicrobials:  vanc 06/23/18- 3/31 ceft 06/23/18>> azithro 06/23/18>>4/2  Interim history/subjective:  Weaned off of amiodarone overnight. Labile blood pressures per RN on phenylephrine.  Objective   Blood pressure (!) 141/59, pulse (!) 50, temperature 99.8 F (37.7 C), temperature source Oral, resp. rate 18, height $RemoveBe'5\' 5"'iNPIxosuy$  (1.651 m), weight 70.9 kg, SpO2 100 %.    Vent Mode: PRVC FiO2 (%):  [30 %-50 %] 40 % Set Rate:  [24 bmp] 24 bmp Vt Set:  [500 mL] 500 mL PEEP:  [5 cmH20] 5 cmH20 Plateau Pressure:  [16 cmH20-21 cmH20] 17 cmH20   Intake/Output Summary (Last 24 hours) at 06/28/2018 0758 Last data filed at 06/28/2018 0700 Gross per 24 hour  Intake 3257.27 ml  Output 525 ml  Net 2732.27 ml   Filed Weights   06/27/18 0445 06/27/18 1334 06/28/18 0348  Weight: 69.3 kg 68.9 kg 70.9 kg    Examination: General:  Acutely ill appearing male, NAD HENT: PERRL Lungs: Coarse BS diffusely Cardiovascular: RRR, Nl S1/S2 and -M/R/G Abdomen: Soft, NT, ND and +BS Extremities: -edema and -tenderness Neuro: Sedate, opens eyes to command, moving everything to command Skin: clean, dry, warm. Scattered scabbed abrasions on BUE. No rash   Resolved Hospital Problem list     Assessment & Plan:   Acute Respiratory Failure with hypoxia ?CAP  Pulmonary Edema P Was on azithromycin/ceftriaxone; azithro held this morning due to prolonged QTc COVID negative P -Continue ceftriaxone day 5/7 -Monitor culture data -Pulmonary hygiene -No work-up assessment this time -Serial chest x-ray   VT arrest requiring amiodarone and chest compressions on 06/27/2018.  Last 2D echo 10/2017 showed EF of 60 to 65% he is a known history of congestive heart failure, grade 1 diastolic dysfunction was noted per Dr. Radford Pax. -Off of amio currently -s/p cardiac cath yesterday - non-obstructive CAD   Septic shock- likely secondary to cap COVID negative from [facility] - scanned in chart P -Pressors as needed to maintain mean arterial pressure greater than 65. -Judicious use of sedation - Continue Rocephin day 5/7; received 4d of azithromycin - stopped this morning due to prolonged qtc  DM P -Sliding scale insulin:  End-stage renal disease.   -compliant with iHD but given pressor requirement inpatient, is on CRRT  P  -Nephrology is following appreciate recommendations -06/27/2018 no dialysis planned due to arrest - likely will restart  CRRT today, will f/u nephrology recs  Hypothyroidism -Currently on Synthroid  GERD  -Currently on PPI  Best practice:  Diet: NPO  Pain/Anxiety/Delirium protocol (if indicated): Fentanyl gtt, PRN fent  VAP protocol (if indicated): continue  DVT prophylaxis: SQ Heparin  GI prophylaxis: protonix Glucose control: SSI Mobility: bedrest  Code Status: full code  Family Communication: 06/27/2018 no  family at bedside Disposition: Continue ICU level of care   Labs   CBC: Recent Labs  Lab 06/24/18 0639 06/25/18 0443 06/26/18 0519 06/27/18 0320 06/27/18 0437 06/27/18 0738 06/27/18 1102 06/28/18 0459  WBC 23.4* 18.9* 11.8*  --  13.9*  --   --  10.1  NEUTROABS 19.3*  --   --   --   --   --   --  7.7  HGB 11.3* 10.7* 10.9* 10.2* 10.9* 9.5* 9.5* 9.0*  HCT 34.7* 33.3* 34.8* 30.0* 36.7* 28.0* 28.0* 30.4*  MCV 90.6 93.3 98.9  --  100.5*  --   --  100.0  PLT 281 208 147*  --  180  --   --  254    Basic Metabolic Panel: Recent Labs  Lab 06/25/18 0443 06/25/18 1442 06/26/18 0519 06/26/18 2153 06/27/18 0320 06/27/18 0437 06/27/18 0738 06/27/18 1102 06/28/18 0459  NA 137 136 138 136 143 139 142 143 143  K 3.9 4.4 4.3 3.9 3.9 4.1 3.7 3.6 4.1  CL 101 104 103 105  --  103  --   --  107  CO2 21* 24 23 19*  --  15*  --   --  17*  GLUCOSE 143* 152* 131* 109*  --  157*  --   --  151*  BUN 37* 25* 17 23  --  26*  --   --  34*  CREATININE 4.45* 3.43* 2.86* 3.20*  --  4.26*  --   --  6.09*  CALCIUM 7.5* 7.5* 7.8* 7.8*  --  8.1*  --   --  7.7*  MG 1.9  --  2.3 2.5*  --  2.6*  --   --  2.5*  PHOS 2.5 2.8 3.8  --   --  3.9  --   --  4.7*   GFR: Estimated Creatinine Clearance: 10 mL/min (A) (by C-G formula based on SCr of 6.09 mg/dL (H)). Recent Labs  Lab 06/24/18 0650 06/24/18 0803 06/25/18 0443 06/26/18 0519 06/27/18 0437 06/28/18 0459  WBC  --   --  18.9* 11.8* 13.9* 10.1  LATICACIDVEN 1.9 1.6 1.4  --   --   --     Liver Function Tests: Recent Labs  Lab 06/24/18 0639 06/25/18 0443 06/25/18 1442 06/26/18 0519  AST 18  --   --   --   ALT 11  --   --   --   ALKPHOS 83  --   --   --   BILITOT 0.7  --   --   --   PROT 4.9*  --   --   --   ALBUMIN 2.5* 2.5* 2.5* 2.8*   No results for input(s): LIPASE, AMYLASE in the last 168 hours. No results for input(s): AMMONIA in the last 168 hours.  ABG    Component Value Date/Time   PHART 7.456 (H) 06/27/2018 1102   PCO2ART  34.3 06/27/2018 1102   PO2ART 495.0 (H) 06/27/2018 1102   HCO3 24.1 06/27/2018 1102   TCO2 25 06/27/2018 1102   ACIDBASEDEF 4.0 (H) 06/27/2018 0738   O2SAT 100.0 06/27/2018 1102  Coagulation Profile: No results for input(s): INR, PROTIME in the last 168 hours.  Cardiac Enzymes: Recent Labs  Lab 06/26/18 2153 06/27/18 0437 06/27/18 1012  TROPONINI 2.05* 2.65* 2.75*    HbA1C: Hgb A1c MFr Bld  Date/Time Value Ref Range Status  11/21/2017 07:05 PM 9.1 (H) 4.8 - 5.6 % Final    Comment:    (NOTE) Pre diabetes:          5.7%-6.4% Diabetes:              >6.4% Glycemic control for   <7.0% adults with diabetes   03/11/2016 04:12 AM 6.7 (H) 4.8 - 5.6 % Final    Comment:    (NOTE)         Pre-diabetes: 5.7 - 6.4         Diabetes: >6.4         Glycemic control for adults with diabetes: <7.0    CBG: Recent Labs  Lab 06/27/18 1102 06/27/18 1539 06/27/18 2043 06/28/18 0409 06/28/18 0744  GLUCAP 85 149* 100* [place], MD IMTS - PGY3 Pager [phone] 06/28/2018, 7:58 AM

## 2018-06-28 NOTE — Progress Notes (Signed)
[facility] Progress Note  Subjective: on vent. Responding to some commands, on the vent, on lower dose neo gtt. I/O +2.6 L , wt's up 71kg (76edw). Vent 40%, CXR no chg R sided mild infiltrates  Vitals:   06/28/18 1155 06/28/18 1156 06/28/18 1209 06/28/18 1210  BP:      Pulse: 72 69 67 67  Resp: (!) 24 (!) 24 (!) 21 (!) 24  Temp:      TempSrc:      SpO2: 100% 100% 100% 100%  Weight:      Height:        Inpatient medications: . aspirin  81 mg Per Tube Daily  . chlorhexidine gluconate (MEDLINE KIT)  15 mL Mouth Rinse BID  . Chlorhexidine Gluconate Cloth  6 each Topical Daily  . clonazePAM  1 mg Per Tube Q8H  . feeding supplement (PRO-STAT SUGAR FREE 64)  30 mL Per Tube QID  . feeding supplement (VITAL 1.5 CAL)  1,000 mL Per Tube Q24H  . heparin  5,000 Units Subcutaneous Q8H  . insulin aspart  0-24 Units Subcutaneous Q4H  . levothyroxine  50 mcg Per Tube Q0600  . mouth rinse  15 mL Mouth Rinse 10 times per day  . pantoprazole sodium  40 mg Per Tube Q24H  . sodium chloride flush  10-40 mL Intracatheter Q12H  . sodium chloride flush  3 mL Intravenous Q12H   . sodium chloride    . amiodarone Stopped (06/28/18 0743)  . cefTRIAXone (ROCEPHIN)  IV Stopped (06/27/18 2154)  . fentaNYL infusion INTRAVENOUS 150 mcg/hr (06/28/18 1200)  . phenylephrine (NEO-SYNEPHRINE) Adult infusion Stopped (06/28/18 1123)   sodium chloride, acetaminophen, docusate, fentaNYL, midazolam, ondansetron (ZOFRAN) IV, sodium chloride flush, sodium chloride flush  Iron/TIBC/Ferritin/ %Sat    Component Value Date/Time   IRON 46 03/11/2016 0412   TIBC 207 (L) 03/11/2016 0412   FERRITIN 222 03/11/2016 0412   IRONPCTSAT 22 03/11/2016 0412    Exam:  on vent, eyes open confused  no jvd Chest bilat scattered crackles, no wheezing Cor RRR no RG  Abd slight distended, soft +bs  Ext no sig LE edema  R arm AVF+bruit     [facility]  4h  76.5kg   2/2.25 bath  Hep none  RUE AVF  400/800  -  Micera 200 mcg IV every 2 weeks  - Venofer 14m IV once per week      Assessment/Plan: 1.  HCAP/ severe sepsis/ fever/ pulm infiltrates - fevers/ hypothermia alternating. Is getting IV abx. COVID testing was negative.  2. VT cardiac arrest : in am 4/1, 5 min CPR responded to IV amio 3.  ESRD - usual HD is TTS. Started CVVHD here 3/29, held on 3/31. No need CRRT yet, try to get off of pressors today and [facility]  4.  Hypertension/volume  - CXR improved since admission, residual mild R sided infiltrates.  5kg under dry wt today, no vol ^ on exam 5.  Anemia  - stable 6.  Metabolic bone disease -  stable 7.  Nutrition - npo for now 8. AMS- due to pna and sepsis/ arrest   [facility] 06/28/2018, 12:14 PM  Recent Labs  Lab 06/25/18 1442 06/26/18 0519  06/27/18 0437  06/27/18 1102 06/28/18 0459  NA 136 138   < > 139   < > 143 143  K 4.4 4.3   < > 4.1   < > 3.6 4.1  CL 104 103   < > 103  --   --  107  CO2 24 23   < > 15*  --   --  17*  GLUCOSE 152* 131*   < > 157*  --   --  151*  BUN 25* 17   < > 26*  --   --  34*  CREATININE 3.43* 2.86*   < > 4.26*  --   --  6.09*  CALCIUM 7.5* 7.8*   < > 8.1*  --   --  7.7*  PHOS 2.8 3.8  --  3.9  --   --  4.7*  ALBUMIN 2.5* 2.8*  --   --   --   --   --    < > = values in this interval not displayed.   Recent Labs  Lab 06/24/18 0639  AST 18  ALT 11  ALKPHOS 83  BILITOT 0.7  PROT 4.9*   Recent Labs  Lab 06/24/18 0639  06/27/18 0437  06/27/18 1102 06/28/18 0459  WBC 23.4*   < > 13.9*  --   --  10.1  NEUTROABS 19.3*  --   --   --   --  7.7  HGB 11.3*   < > 10.9*   < > 9.5* 9.0*  HCT 34.7*   < > 36.7*   < > 28.0* 30.4*  MCV 90.6   < > 100.5*  --   --  100.0  PLT 281   < > 180  --   --  158   < > = values in this interval not displayed.

## 2018-06-28 NOTE — Progress Notes (Signed)
CRITICAL VALUE ALERT  Critical Value: EKG QTC  654  Date & Time Notied: 06/28/2018  Provider Notified: Reino Bellis, NP   Orders Received/Actions taken: Stop amio gtt.  Lucius Conn, RN

## 2018-06-29 ENCOUNTER — Inpatient Hospital Stay (HOSPITAL_COMMUNITY): Payer: Medicare HMO

## 2018-06-29 LAB — CBC
HCT: 28.7 % — ABNORMAL LOW (ref 39.0–52.0)
Hemoglobin: 8.5 g/dL — ABNORMAL LOW (ref 13.0–17.0)
MCH: 29.1 pg (ref 26.0–34.0)
MCHC: 29.6 g/dL — ABNORMAL LOW (ref 30.0–36.0)
MCV: 98.3 fL (ref 80.0–100.0)
Platelets: 123 10*3/uL — ABNORMAL LOW (ref 150–400)
RBC: 2.92 MIL/uL — ABNORMAL LOW (ref 4.22–5.81)
RDW: 15.4 % (ref 11.5–15.5)
WBC: 5.4 10*3/uL (ref 4.0–10.5)
nRBC: 0 % (ref 0.0–0.2)

## 2018-06-29 LAB — GLUCOSE, CAPILLARY
Glucose-Capillary: 101 mg/dL — ABNORMAL HIGH (ref 70–99)
Glucose-Capillary: 103 mg/dL — ABNORMAL HIGH (ref 70–99)
Glucose-Capillary: 126 mg/dL — ABNORMAL HIGH (ref 70–99)
Glucose-Capillary: 132 mg/dL — ABNORMAL HIGH (ref 70–99)
Glucose-Capillary: 140 mg/dL — ABNORMAL HIGH (ref 70–99)
Glucose-Capillary: 141 mg/dL — ABNORMAL HIGH (ref 70–99)
Glucose-Capillary: 144 mg/dL — ABNORMAL HIGH (ref 70–99)
Glucose-Capillary: 149 mg/dL — ABNORMAL HIGH (ref 70–99)
Glucose-Capillary: 170 mg/dL — ABNORMAL HIGH (ref 70–99)
Glucose-Capillary: 255 mg/dL — ABNORMAL HIGH (ref 70–99)

## 2018-06-29 LAB — CULTURE, BLOOD (ROUTINE X 2)
Culture: NO GROWTH
Culture: NO GROWTH
Special Requests: ADEQUATE
Special Requests: ADEQUATE

## 2018-06-29 LAB — BASIC METABOLIC PANEL
Anion gap: 13 (ref 5–15)
BUN: 49 mg/dL — ABNORMAL HIGH (ref 8–23)
CO2: 21 mmol/L — ABNORMAL LOW (ref 22–32)
Calcium: 7.6 mg/dL — ABNORMAL LOW (ref 8.9–10.3)
Chloride: 109 mmol/L (ref 98–111)
Creatinine, Ser: 7.44 mg/dL — ABNORMAL HIGH (ref 0.61–1.24)
GFR calc Af Amer: 8 mL/min — ABNORMAL LOW (ref >60)
GFR calc non Af Amer: 7 mL/min — ABNORMAL LOW (ref >60)
Glucose, Bld: 177 mg/dL — ABNORMAL HIGH (ref 70–99)
Potassium: 3.3 mmol/L — ABNORMAL LOW (ref 3.5–5.1)
Sodium: 143 mmol/L (ref 135–145)

## 2018-06-29 LAB — MAGNESIUM: Magnesium: 2.4 mg/dL (ref 1.7–2.4)

## 2018-06-29 MED ORDER — DEXMEDETOMIDINE HCL IN NACL 200 MCG/50ML IV SOLN
0.0000 ug/kg/h | INTRAVENOUS | Status: DC
  Administered 2018-06-29: 0.6 ug/kg/h via INTRAVENOUS
  Administered 2018-06-29: 0.4 ug/kg/h via INTRAVENOUS
  Administered 2018-06-30: 0.3 ug/kg/h via INTRAVENOUS
  Filled 2018-06-29 (×2): qty 50

## 2018-06-29 MED ORDER — POTASSIUM CHLORIDE 20 MEQ/15ML (10%) PO SOLN
20.0000 meq | Freq: Once | ORAL | Status: AC
  Administered 2018-06-29: 20 meq via ORAL
  Filled 2018-06-29: qty 15

## 2018-06-29 MED ORDER — DEXMEDETOMIDINE HCL IN NACL 200 MCG/50ML IV SOLN
0.4000 ug/kg/h | INTRAVENOUS | Status: DC
  Administered 2018-06-29: 0.4 ug/kg/h via INTRAVENOUS
  Filled 2018-06-29 (×2): qty 50

## 2018-06-29 MED ORDER — POTASSIUM CHLORIDE 20 MEQ/15ML (10%) PO SOLN: 20.0000 meq | Freq: Once | ORAL | Status: DC

## 2018-06-29 MED ORDER — CHLORHEXIDINE GLUCONATE CLOTH 2 % EX PADS: 6.0000 | MEDICATED_PAD | Freq: Every day | CUTANEOUS | Status: DC

## 2018-06-29 MED ORDER — HYDRALAZINE HCL 20 MG/ML IJ SOLN
10.0000 mg | INTRAMUSCULAR | Status: DC | PRN
  Administered 2018-06-30: 10 mg via INTRAVENOUS
  Filled 2018-06-29: qty 1

## 2018-06-29 NOTE — Progress Notes (Signed)
Wasted fentanyl gtt (about 240 mLs) with Doretha Sou RN.

## 2018-06-29 NOTE — Progress Notes (Signed)
[facility] Progress Note  Subjective: on vent. 2.3 L + yest , off presosrs.  wts to 71kg (76kg edw). CXR clear today.  BUN 49, Cr 7.4.   Vitals:   06/29/18 0930 06/29/18 0945 06/29/18 1000 06/29/18 1015  BP: (!) 108/46     Pulse: 70 70 67 70  Resp: (!) 9     Temp:      TempSrc:      SpO2:      Weight:      Height:        Inpatient medications: . aspirin  81 mg Per Tube Daily  . chlorhexidine gluconate (MEDLINE KIT)  15 mL Mouth Rinse BID  . Chlorhexidine Gluconate Cloth  6 each Topical Daily  . Chlorhexidine Gluconate Cloth  6 each Topical Q0600  . clonazePAM  1 mg Per Tube Q8H  . feeding supplement (PRO-STAT SUGAR FREE 64)  30 mL Per Tube QID  . feeding supplement (VITAL 1.5 CAL)  1,000 mL Per Tube Q24H  . heparin  5,000 Units Subcutaneous Q8H  . insulin aspart  0-24 Units Subcutaneous Q4H  . levothyroxine  50 mcg Per Tube Q0600  . mouth rinse  15 mL Mouth Rinse 10 times per day  . pantoprazole sodium  40 mg Per Tube Q24H  . sodium chloride flush  10-40 mL Intracatheter Q12H  . sodium chloride flush  3 mL Intravenous Q12H   . sodium chloride    . amiodarone Stopped (06/28/18 0743)  . cefTRIAXone (ROCEPHIN)  IV Stopped (06/28/18 2236)  . fentaNYL infusion INTRAVENOUS 50 mcg/hr (06/29/18 1034)  . phenylephrine (NEO-SYNEPHRINE) Adult infusion 5 mcg/min (06/29/18 0800)  . sodium chloride 250 mL (06/28/18 1511)   sodium chloride, acetaminophen, docusate, fentaNYL, midazolam, ondansetron (ZOFRAN) IV, sodium chloride, sodium chloride flush, sodium chloride flush  Iron/TIBC/Ferritin/ %Sat    Component Value Date/Time   IRON 46 03/11/2016 0412   TIBC 207 (L) 03/11/2016 0412   FERRITIN 222 03/11/2016 0412   IRONPCTSAT 22 03/11/2016 0412    Exam:  on vent, eyes open confused  no jvd Chest clear ant/ lat, norales or wheezing Cor RRR no RG  Abd slight distended, soft +bs  Ext no sig LE edema  R arm AVF+bruit     [facility]  4h  76.5kg   2/2.25  bath  Hep none  RUE AVF  400/800  - Micera 200 mcg IV every 2 weeks  - Venofer 95m IV once per week      Assessment/Plan: 1.  HCAP/ severe sepsis/ fever/ pulm infiltrates - fevers/ hypothermia have resolved, normothermia the last 24 hrs. Is getting IV abx w Rocephin. CXR has cleared.  COVID testing was negative.  2. VT cardiac arrest : on 4/1, 5 min CPR responded to IV amio 3. VDRF - on vent per primary team 4.  ESRD - usual HD is TTS. CVVHD here 3/29- 31. Will plan HD today and tomorrow.  5.  Hypertension/volume  - CXR much better, normal today. 5kg below edw and no edema on exam.  6.  Anemia CKD  - slow drop 11 >> 8.5 today. Will start darbe 150 ug w/ HD today then weekly. Check fe/tibc.  7.  MBD ckd  -  Ca/phos ok, hold meds for now 8.  Nutrition - on tube feeds 9. AMS- due to pna and sepsis/ arrest   [facility] 06/29/2018, 10:52 AM  Recent Labs  Lab 06/25/18 1442 06/26/18 08315 06/27/18 01761  06/28/18 0459 06/29/18 0518  NA 136 138   < > 139   < > 143 143  K 4.4 4.3   < > 4.1   < > 4.1 3.3*  CL 104 103   < > 103  --  107 109  CO2 24 23   < > 15*  --  17* 21*  GLUCOSE 152* 131*   < > 157*  --  151* 177*  BUN 25* 17   < > 26*  --  34* 49*  CREATININE 3.43* 2.86*   < > 4.26*  --  6.09* 7.44*  CALCIUM 7.5* 7.8*   < > 8.1*  --  7.7* 7.6*  PHOS 2.8 3.8  --  3.9  --  4.7*  --   ALBUMIN 2.5* 2.8*  --   --   --   --   --    < > = values in this interval not displayed.   Recent Labs  Lab 06/24/18 0639  AST 18  ALT 11  ALKPHOS 83  BILITOT 0.7  PROT 4.9*   Recent Labs  Lab 06/24/18 0639  06/28/18 0459 06/29/18 0518  WBC 23.4*   < > 10.1 5.4  NEUTROABS 19.3*  --  7.7  --   HGB 11.3*   < > 9.0* 8.5*  HCT 34.7*   < > 30.4* 28.7*  MCV 90.6   < > 100.0 98.3  PLT 281   < > 158 123*   < > = values in this interval not displayed.

## 2018-06-29 NOTE — Progress Notes (Signed)
Facilitated video phone call with wife, nurse, and pt

## 2018-06-29 NOTE — Progress Notes (Signed)
eLink Physician-Brief Progress Note Patient Name: Alexander Whitaker DOB: 1951-01-31 MRN: [id number]   Date of Service  06/29/2018  HPI/Events of Note  Pt with ESRD with K 3.3, crea 7.  Intermittent HD will likely be resumed today by nephrology note.   eICU Interventions  Hold off on K repletion.      Intervention Category Intermediate Interventions: Electrolyte abnormality - evaluation and management  Elsie Lincoln 06/29/2018, 6:41 AM

## 2018-06-29 NOTE — Progress Notes (Signed)
NAME:  Alexander Whitaker, MRN:  [id number], DOB:  [birth year], LOS: 5 ADMISSION DATE:  06/24/2018, CONSULTATION DATE:  3/29/202 REFERRING MD:  Oval Linsey, CHIEF COMPLAINT:  AMS/resp failure   Brief History   tx from [facility] for AMS/resp failure  History of present illness   68 year old male with history of esrd on hd, cva, htn, chf, presented to [place] with ams.  He had fever of 101.6 at [place] and only responsive to deep stimuli.  He was intubated.  Cxr showed diffuse infiltrates. Blood sugar of 638.  statted on isulin gtt.   He was given vanc, ceft, azithro.  He was tx today for icu management  Past Medical History  esrd on hd cva  htn chf  Significant Hospital Events   ett 06/23/18 Femoral aline 06/24/18>4/2 RIJ 06/24/18  Consults:  Nephrology  Cardiology EP  Procedures:  ett 06/23/18 Femoral aline 06/24/18 RIJ 06/24/18  Significant Diagnostic Tests:  cxr 06/23/18 with diffuse infiltrates CXR 3/30- bilateral opacities, personally reviewed  Cardiac Cath 4/1- nonobstructive CAD CXR 4/3- improving bil infiltrates  Micro Data:  Sputum cx 06/24/18> rare budding yeast > candida glabrata Blood cx 06/24/18  MRSA PCR 3/29 negative  Antimicrobials:  vanc 06/23/18- 3/31 ceft 06/23/18>> azithro 06/23/18>>4/2  Interim history/subjective:  Able to be weaned off of phenylephrine  Objective   Blood pressure (!) 140/57, pulse 75, temperature 98.2 F (36.8 C), temperature source Oral, resp. rate 14, height $RemoveBe'5\' 5"'XfXwqEfjL$  (1.651 m), weight 70.7 kg, SpO2 100 %.    Vent Mode: PRVC FiO2 (%):  [40 %-50 %] 40 % Set Rate:  [24 bmp] 24 bmp Vt Set:  [500 mL] 500 mL PEEP:  [5 cmH20] 5 cmH20 Plateau Pressure:  [14 cmH20-17 cmH20] 16 cmH20   Intake/Output Summary (Last 24 hours) at 06/29/2018 0914 Last data filed at 06/29/2018 0800 Gross per 24 hour  Intake 2457.78 ml  Output 100 ml  Net 2357.78 ml   Filed Weights   06/27/18 1334 06/28/18 0348 06/29/18 0500  Weight: 68.9 kg 70.9 kg 70.7 kg     Examination: General: Acutely ill appearing male, NAD HENT: PERRL Lungs: Coarse BS diffusely Cardiovascular: RRR, Nl S1/S2 and -M/R/G Abdomen: Soft, NT, ND and +BS Extremities: -edema and -tenderness Neuro: Sedated this morning but will intermittently spontaneously open eyes Skin: clean, dry, warm. Scattered scabbed abrasions on BUE. No rash   Resolved Hospital Problem list     Assessment & Plan:   Acute Respiratory Failure with hypoxia CAP  Pulmonary Edema Was on azithromycin/ceftriaxone; azithro stopped 4/1 due to prolonged QTc COVID negative P -Continue ceftriaxone day 6/7 -Monitor culture data -Pulmonary hygiene -Serial chest x-ray   VT arrest requiring amiodarone and chest compressions on 06/27/2018, cath nonobstructive disease.  Last 2D echo 10/2017 showed EF of 60 to 65% he is a known history of congestive heart failure, grade 1 diastolic dysfunction was noted per Dr. Radford Pax. -has maintained SR  P -maintain K >3.9 and Mag >1.9 -f/u EKG to eval qtc   Septic shock- likely secondary to CAP COVID negative from [facility] - scanned in chart P - Pressors as needed to maintain mean arterial pressure greater than 65. - Judicious use of sedation - Continue Rocephin day 6/7; received 4d of azithromycin  DM P -Sliding scale insulin  End-stage renal disease.   Had CRRT briefly this admission due to pressor requirement, now of iHD  Hypothyroidism -Currently on Synthroid  GERD  -Currently on PPI  Best practice:  Diet: NPO,  TF Pain/Anxiety/Delirium protocol (if indicated): Fentanyl gtt, PRN fent  VAP protocol (if indicated): continue  DVT prophylaxis: SQ Heparin  GI prophylaxis: protonix Glucose control: SSI Mobility: bedrest  Code Status: full code  Family Communication: 06/28/2018 updated family via phone Disposition: Continue ICU level of care   Labs   CBC: Recent Labs  Lab 06/24/18 0639 06/25/18 0443 06/26/18 0519  06/27/18 0437 06/27/18 0738 06/27/18  1102 06/28/18 0459 06/29/18 0518  WBC 23.4* 18.9* 11.8*  --  13.9*  --   --  10.1 5.4  NEUTROABS 19.3*  --   --   --   --   --   --  7.7  --   HGB 11.3* 10.7* 10.9*   < > 10.9* 9.5* 9.5* 9.0* 8.5*  HCT 34.7* 33.3* 34.8*   < > 36.7* 28.0* 28.0* 30.4* 28.7*  MCV 90.6 93.3 98.9  --  100.5*  --   --  100.0 98.3  PLT 281 208 147*  --  180  --   --  158 123*   < > = values in this interval not displayed.    Basic Metabolic Panel: Recent Labs  Lab 06/25/18 0443 06/25/18 1442 06/26/18 0519 06/26/18 2153  06/27/18 0437 06/27/18 0738 06/27/18 1102 06/28/18 0459 06/29/18 0518  NA 137 136 138 136   < > 139 142 143 143 143  K 3.9 4.4 4.3 3.9   < > 4.1 3.7 3.6 4.1 3.3*  CL 101 104 103 105  --  103  --   --  107 109  CO2 21* 24 23 19*  --  15*  --   --  17* 21*  GLUCOSE 143* 152* 131* 109*  --  157*  --   --  151* 177*  BUN 37* 25* 17 23  --  26*  --   --  34* 49*  CREATININE 4.45* 3.43* 2.86* 3.20*  --  4.26*  --   --  6.09* 7.44*  CALCIUM 7.5* 7.5* 7.8* 7.8*  --  8.1*  --   --  7.7* 7.6*  MG 1.9  --  2.3 2.5*  --  2.6*  --   --  2.5*  --   PHOS 2.5 2.8 3.8  --   --  3.9  --   --  4.7*  --    < > = values in this interval not displayed.   GFR: Estimated Creatinine Clearance: 8.2 mL/min (A) (by C-G formula based on SCr of 7.44 mg/dL (H)). Recent Labs  Lab 06/24/18 0650 06/24/18 0803 06/25/18 0443 06/26/18 0519 06/27/18 0437 06/28/18 0459 06/29/18 0518  WBC  --   --  18.9* 11.8* 13.9* 10.1 5.4  LATICACIDVEN 1.9 1.6 1.4  --   --   --   --     Liver Function Tests: Recent Labs  Lab 06/24/18 0639 06/25/18 0443 06/25/18 1442 06/26/18 0519  AST 18  --   --   --   ALT 11  --   --   --   ALKPHOS 83  --   --   --   BILITOT 0.7  --   --   --   PROT 4.9*  --   --   --   ALBUMIN 2.5* 2.5* 2.5* 2.8*   No results for input(s): LIPASE, AMYLASE in the last 168 hours. No results for input(s): AMMONIA in the last 168 hours.  ABG    Component Value Date/Time   PHART 7.456 (H)  06/27/2018 1102   PCO2ART 34.3 06/27/2018 1102   PO2ART 495.0 (H) 06/27/2018 1102   HCO3 24.1 06/27/2018 1102   TCO2 25 06/27/2018 1102   ACIDBASEDEF 4.0 (H) 06/27/2018 0738   O2SAT 100.0 06/27/2018 1102     Coagulation Profile: No results for input(s): INR, PROTIME in the last 168 hours.  Cardiac Enzymes: Recent Labs  Lab 06/26/18 2153 06/27/18 0437 06/27/18 1012  TROPONINI 2.05* 2.65* 2.75*    HbA1C: Hgb A1c MFr Bld  Date/Time Value Ref Range Status  11/21/2017 07:05 PM 9.1 (H) 4.8 - 5.6 % Final    Comment:    (NOTE) Pre diabetes:          5.7%-6.4% Diabetes:              >6.4% Glycemic control for   <7.0% adults with diabetes   03/11/2016 04:12 AM 6.7 (H) 4.8 - 5.6 % Final    Comment:    (NOTE)         Pre-diabetes: 5.7 - 6.4         Diabetes: >6.4         Glycemic control for adults with diabetes: <7.0    CBG: Recent Labs  Lab 06/28/18 1548 06/28/18 2102 06/29/18 0027 06/29/18 0344 06/29/18 0757  GLUCAP 125* 164* 141* 144* 140*   Alphonzo Grieve, MD IMTS - PGY3 Pager [phone]  06/29/2018, 9:14 AM

## 2018-06-29 NOTE — Progress Notes (Signed)
SLP Cancellation Note  Patient Details Name: Alexander Whitaker MRN: [id number] DOB: January 08, 1951   Cancelled treatment:       Reason Eval/Treat Not Completed: Patient not medically ready. Remains intubated. Will f/u for swallow eval as able.   Venita Sheffield Willella Harding 06/29/2018, 8:40 AM  [name], M.A. [place] Acute Environmental education officer [phone] Office [phone]

## 2018-06-29 NOTE — Progress Notes (Signed)
    Patient chart reviewed and discussed with nephrology and critical care medicine.  Was seen this morning with consult note signed by EP.  Cardiac cath with no significant ischemic CAD.  EP seems to think that the VT episode was related to prolonged QT from amiodarone which had been stopped overnight yesterday.  No further episodes now.  Is currently getting dialysis.  Blood pressure levels continue to be labile and therefore I am reluctant to consider beta-blocker until we see stable blood pressures.  Echo done yesterday shows normal EF with no significant valvular lesions besides some mild mild to moderate occult regurgitation. This would argue again against any structural heart disease related ventricular tachycardia/fibrillation.   At this point, no new cardiology recommendations.  We will be available if there is any further assistance needed. We will follow from a distance, please recontact [place] if there is any further arrhythmias.  I do suspect that his previous arrhythmias were related to his sepsis.    Glenetta Hew, M.D., M.S. Interventional Cardiologist   Pager # [phone] Phone # [phone] [address] [place], [place] [address]

## 2018-06-30 LAB — GLUCOSE, CAPILLARY
Glucose-Capillary: 111 mg/dL — ABNORMAL HIGH (ref 70–99)
Glucose-Capillary: 113 mg/dL — ABNORMAL HIGH (ref 70–99)
Glucose-Capillary: 115 mg/dL — ABNORMAL HIGH (ref 70–99)
Glucose-Capillary: 140 mg/dL — ABNORMAL HIGH (ref 70–99)
Glucose-Capillary: 185 mg/dL — ABNORMAL HIGH (ref 70–99)
Glucose-Capillary: 252 mg/dL — ABNORMAL HIGH (ref 70–99)

## 2018-06-30 LAB — CBC
HCT: 27.4 % — ABNORMAL LOW (ref 39.0–52.0)
Hemoglobin: 8.3 g/dL — ABNORMAL LOW (ref 13.0–17.0)
MCH: 29.1 pg (ref 26.0–34.0)
MCHC: 30.3 g/dL (ref 30.0–36.0)
MCV: 96.1 fL (ref 80.0–100.0)
Platelets: 119 10*3/uL — ABNORMAL LOW (ref 150–400)
RBC: 2.85 MIL/uL — ABNORMAL LOW (ref 4.22–5.81)
RDW: 14.7 % (ref 11.5–15.5)
WBC: 5.9 10*3/uL (ref 4.0–10.5)
nRBC: 0 % (ref 0.0–0.2)

## 2018-06-30 LAB — TRIGLYCERIDES: Triglycerides: 173 mg/dL — ABNORMAL HIGH (ref <150)

## 2018-06-30 LAB — IRON AND TIBC
Iron: 13 ug/dL — ABNORMAL LOW (ref 45–182)
Saturation Ratios: 10 % — ABNORMAL LOW (ref 17.9–39.5)
TIBC: 125 ug/dL — ABNORMAL LOW (ref 250–450)
UIBC: 112 ug/dL

## 2018-06-30 LAB — MAGNESIUM: Magnesium: 1.9 mg/dL (ref 1.7–2.4)

## 2018-06-30 LAB — HEPATITIS B SURFACE ANTIGEN: Hepatitis B Surface Ag: NEGATIVE

## 2018-06-30 LAB — FERRITIN: Ferritin: 1274 ng/mL — ABNORMAL HIGH (ref 24–336)

## 2018-06-30 MED ORDER — DARBEPOETIN ALFA 150 MCG/0.3ML IJ SOSY: 150.0000 ug | PREFILLED_SYRINGE | INTRAMUSCULAR | Status: DC

## 2018-06-30 MED ORDER — CHLORHEXIDINE GLUCONATE CLOTH 2 % EX PADS
6.0000 | MEDICATED_PAD | Freq: Every day | CUTANEOUS | Status: DC
  Administered 2018-07-02 – 2018-07-06 (×4): 6 via TOPICAL

## 2018-06-30 MED ORDER — CHLORHEXIDINE GLUCONATE 0.12 % MT SOLN
15.0000 mL | Freq: Two times a day (BID) | OROMUCOSAL | Status: DC
  Administered 2018-07-01 – 2018-07-09 (×16): 15 mL via OROMUCOSAL
  Filled 2018-06-30 (×13): qty 15

## 2018-06-30 MED ORDER — ORAL CARE MOUTH RINSE
15.0000 mL | Freq: Two times a day (BID) | OROMUCOSAL | Status: DC
  Administered 2018-06-30 – 2018-07-02 (×3): 15 mL via OROMUCOSAL

## 2018-06-30 MED ORDER — HYDRALAZINE HCL 20 MG/ML IJ SOLN
10.0000 mg | INTRAMUSCULAR | Status: DC | PRN
  Administered 2018-06-30 – 2018-07-03 (×4): 10 mg via INTRAVENOUS
  Filled 2018-06-30 (×4): qty 1

## 2018-06-30 NOTE — Progress Notes (Signed)
  He is extubated. Off pressors. EF normal.   Tele reviewed. Remains in NSR. QT remains prolonged. Multiple PVCs frequently near T wave.  ? Possible R-on-T phenomenon.    Keep on tele.Avoid QT prolonging meds. Keep K > 4.0 Mg > 2.0 No other cardiac recs at this time. Will review with EP again.   Glori Bickers, MD  10:27 AM

## 2018-06-30 NOTE — Evaluation (Signed)
Clinical/Bedside Swallow Evaluation Patient Details  Name: Alexander Whitaker MRN: [id number] Date of Birth: 03-26-1951  Today's Date: 06/30/2018 Time: SLP Start Time (ACUTE ONLY): 1696 SLP Stop Time (ACUTE ONLY): 1525 SLP Time Calculation (min) (ACUTE ONLY): 11 min  Past Medical History:  Past Medical History:  Diagnosis Date  . Anemia   . Arthritis    per pt (11/21/2017)  . CHF (congestive heart failure) ([place]) ~ 2017  . CKD stage 3 due to type 2 diabetes mellitus ([place])    stage 5 Dialysis T/Th/Sa  . CVA (cerebral vascular accident) [facility]) 2007   wife denies residual on 11/21/2017  . Diabetic peripheral neuropathy ([place])   . Dyspnea   . ESRD (end stage renal disease) on dialysis ([place])    "[place]; TTS" (11/21/2017)  . GERD (gastroesophageal reflux disease)    has in the past  . Headache    since going on dialysis  . Hypertension   . Hypothyroidism   . PONV (postoperative nausea and vomiting)   . Restless legs   . Type II diabetes mellitus ([place])    Past Surgical History:  Past Surgical History:  Procedure Laterality Date  . AV FISTULA PLACEMENT Right 10/07/2016   Procedure: RIGHT BRACHIOCEPHALIC ARTERIOVENOUS (AV) FISTULA CREATION;  Surgeon: Angelia Mould, MD;  Location: [place];  Service: Vascular;  Laterality: Right;  . BACK SURGERY    . CATARACT EXTRACTION W/ INTRAOCULAR LENS IMPLANT Left   . CIRCUMCISION  2015  . HERNIA REPAIR  2016   "stomach"  . LEFT HEART CATH AND CORONARY ANGIOGRAPHY N/A 06/27/2018   Procedure: LEFT HEART CATH AND CORONARY ANGIOGRAPHY;  Surgeon: Troy Sine, MD;  Location: [place] CV LAB;  Service: Cardiovascular;  Laterality: N/A;  . POSTERIOR LUMBAR FUSION  1990s?   "has bolts and rods in there"   HPI:  68 year old male with history of esrd on hd, cva, htn, chf, presented to [facility] with ams.  He had fever of 101.6 at [place] and only responsive to deep stimuli.  Cxr showed diffuse infiltrates. Inutbated from 3/28 until 4/4. VT arrest  requiring amiodarone and chest compressions on 06/27/2018, cath nonobstructive disease.   Assessment / Plan / Recommendation Clinical Impression  Pt demonstrates immediate cough and throat clear following ice and sips of water as well as hoarse vocal quality after extubation today. He tolerated puree well and could take oral medication with puree, but is not ready for liquids this afternoon. Will f/u tomorrow for further trials as pt has been improving since extubation and demonstrate improved function with another 12 hours of recovery. NPO except meds in puree. Discussed with RN.  SLP Visit Diagnosis: Dysphagia, oropharyngeal phase (R13.12)    Aspiration Risk  Moderate aspiration risk    Diet Recommendation NPO except meds   Medication Administration: Whole meds with puree Supervision: Staff to assist with self feeding    Other  Recommendations Oral Care Recommendations: Oral care QID   Follow up Recommendations Skilled Nursing facility      Frequency and Duration min 2x/week  1 week       Prognosis        Swallow Study   General HPI: 68 year old male with history of esrd on hd, cva, htn, chf, presented to [facility] [place] with ams.  He had fever of 101.6 at [place] and only responsive to deep stimuli.  Cxr showed diffuse infiltrates. Inutbated from 3/28 until 4/4. VT arrest requiring amiodarone and chest compressions on 06/27/2018, cath  nonobstructive disease. Type of Study: Bedside Swallow Evaluation Previous Swallow Assessment: none Diet Prior to this Study: NPO Temperature Spikes Noted: No Respiratory Status: Nasal cannula History of Recent Intubation: Yes Length of Intubations (days): 5 days Date extubated: 06/30/18 Behavior/Cognition: Alert;Requires cueing Oral Cavity Assessment: Within Functional Limits Oral Care Completed by SLP: No Oral Cavity - Dentition: Edentulous Vision: Functional for self-feeding Self-Feeding Abilities: Needs assist Patient Positioning: Upright in  chair Baseline Vocal Quality: Hoarse Volitional Cough: Strong Volitional Swallow: Able to elicit    Oral/Motor/Sensory Function Overall Oral Motor/Sensory Function: Within functional limits   Ice Chips Ice chips: Impaired Presentation: Spoon Pharyngeal Phase Impairments: Cough - Immediate;Throat Clearing - Immediate   Thin Liquid Thin Liquid: Impaired Presentation: Straw;Cup Pharyngeal  Phase Impairments: Cough - Immediate;Throat Clearing - Immediate    Nectar Thick Nectar Thick Liquid: Not tested   Honey Thick Honey Thick Liquid: Not tested   Puree Puree: Within functional limits   Solid     Solid: Not tested     Herbie [place], MA [place] Pager [phone] Office [phone]  Lynann Beaver 06/30/2018,3:29 PM

## 2018-06-30 NOTE — Progress Notes (Signed)
[facility] Progress Note  Subjective: on vent, weaned good yesterday.  Wt's up 72kg (edw 76), vent at 40%, +/- pressors still getting low dose levo + vaso  Vitals:   06/30/18 0800 06/30/18 0830 06/30/18 0900 06/30/18 0930  BP: (!) 102/53 (!) 113/57 (!) 116/51 126/79  Pulse: 61 61 63 72  Resp: (!) _0 Temp:      TempSrc:      SpO2: 100% 100% 100% 100%  Weight:      Height:        Inpatient medications: . aspirin  81 mg Per Tube Daily  . chlorhexidine gluconate (MEDLINE KIT)  15 mL Mouth Rinse BID  . Chlorhexidine Gluconate Cloth  6 each Topical Daily  . Chlorhexidine Gluconate Cloth  6 each Topical Q0600  . Chlorhexidine Gluconate Cloth  6 each Topical Q0600  . feeding supplement (PRO-STAT SUGAR FREE 64)  30 mL Per Tube QID  . feeding supplement (VITAL 1.5 CAL)  1,000 mL Per Tube Q24H  . heparin  5,000 Units Subcutaneous Q8H  . insulin aspart  0-24 Units Subcutaneous Q4H  . levothyroxine  50 mcg Per Tube Q0600  . mouth rinse  15 mL Mouth Rinse 10 times per day  . pantoprazole sodium  40 mg Per Tube Q24H  . sodium chloride flush  10-40 mL Intracatheter Q12H  . sodium chloride flush  3 mL Intravenous Q12H   . sodium chloride    . cefTRIAXone (ROCEPHIN)  IV Stopped (06/29/18 2235)  . dexmedetomidine (PRECEDEX) IV infusion 0.2 mcg/kg/hr (06/30/18 0900)  . phenylephrine (NEO-SYNEPHRINE) Adult infusion Stopped (06/30/18 0301)  . sodium chloride 250 mL (06/28/18 1511)   sodium chloride, acetaminophen, docusate, hydrALAZINE, midazolam, ondansetron (ZOFRAN) IV, sodium chloride, sodium chloride flush, sodium chloride flush  Iron/TIBC/Ferritin/ %Sat    Component Value Date/Time   IRON 46 03/11/2016 0412   TIBC 207 (L) 03/11/2016 0412   FERRITIN 222 03/11/2016 0412   IRONPCTSAT 22 03/11/2016 0412    Exam:  on vent, eyes open confused  no jvd Chest clear ant/ lat, no rales or wheezing Cor RRR no RG  Abd slight distended, soft +bs  Ext no sig LE  edema, mild UE edeam  R arm AVF+bruit     [place] TTS  4h  76.5kg   2/2.25 bath  Hep none  RUE AVF  400/800  - Micera 200 mcg IV every 2 weeks  - Venofer 7m IV once per week      Assessment/Plan: 1.  HCAP/ severe sepsis/ fever/ pulm infiltrates - fevers/ hypothermia now resolved, normothermic. Is getting IV abx w Rocephin. CXR has cleared.  COVID testing was negative.  2. VT cardiac arrest : on 4/1, 5 min CPR responded to IV amio 3. VDRF - on vent, per primary team 4.  ESRD - usual HD is TTS. CVVHD here 3/29- 31. Will plan HD again today to get back on schedule.  5.  Hypertension/volume  - CXR normal yesterday. 4kg under dry but some edema, try UF 1-2 L today.  6.  Anemia CKD  - slow drop 11 >> 8.5 today. Darbe 150 ug on 4/4 then weekly. Check fe/tibc.  7.  MBD ckd  -  Ca/phos ok, hold meds for now 8.  Nutrition - on tube feeds 9. AMS- due to pna and sepsis/ arrest   [facility] 06/30/2018, 10:02 AM  Recent Labs  Lab 06/26/18 08675 06/28/18 0459 06/29/18 0518 06/30/18 0320  NA 138   < > 143 143 138  K 4.3   < > 4.1 3.3* 3.6  CL 103   < > 107 109 102  CO2 23   < > 17* 21* 24  GLUCOSE 131*   < > 151* 177* 234*  BUN 17   < > 34* 49* 34*  CREATININE 2.86*   < > 6.09* 7.44* 4.23*  CALCIUM 7.8*   < > 7.7* 7.6* 7.4*  PHOS 3.8   < > 4.7*  --  3.1  ALBUMIN 2.8*  --   --   --  2.1*   < > = values in this interval not displayed.   Recent Labs  Lab 06/24/18 0639  AST 18  ALT 11  ALKPHOS 83  BILITOT 0.7  PROT 4.9*   Recent Labs  Lab 06/24/18 0639  06/28/18 0459 06/29/18 0518 06/30/18 0320  WBC 23.4*   < > 10.1 5.4 5.9  NEUTROABS 19.3*  --  7.7  --   --   HGB 11.3*   < > 9.0* 8.5* 8.3*  HCT 34.7*   < > 30.4* 28.7* 27.4*  MCV 90.6   < > 100.0 98.3 96.1  PLT 281   < > 158 123* 119*   < > = values in this interval not displayed.

## 2018-06-30 NOTE — Procedures (Signed)
Extubation Procedure Note  Patient Details:   Name: Alexander Whitaker DOB: [birth year] MRN: [id number]   Airway Documentation:    Vent end date: 06/30/18 Vent end time: 1218   Evaluation  O2 sats: stable throughout Complications: No apparent complications Patient did tolerate procedure well. Bilateral Breath Sounds: Clear, Diminished   Yes. Pt extubated per physician order with RN at bedside. Pt placed on 3L [place]. Pt with good cough, able to say name and no stridor was heard. Incentive spirometer was initiated. RT will continue to monitor.   Jonathon Jordan Casanova Schurman 06/30/2018, 12:21 PM

## 2018-06-30 NOTE — Progress Notes (Signed)
NAME:  Alexander Whitaker, MRN:  [id number], DOB:  07/02/1950, LOS: 6 ADMISSION DATE:  06/24/2018, CONSULTATION DATE: 06/24/2018 REFERRING MD: [facility] ED, CHIEF COMPLAINT: Respiratory failure  HPI/course in hospital  68 year old man who presented with altered mental status fever and new chest infiltrates to [facility] Background history of stage V kidney disease and congestive heart failure.  Past Medical History   Past Medical History:  Diagnosis Date  . Anemia   . Arthritis    per pt (11/21/2017)  . CHF (congestive heart failure) ([place]) ~ 2017  . CKD stage 3 due to type 2 diabetes mellitus ([place])    stage 5 Dialysis T/Th/Sa  . CVA (cerebral vascular accident) [facility]) 2007   wife denies residual on 11/21/2017  . Diabetic peripheral neuropathy ([place])   . Dyspnea   . ESRD (end stage renal disease) on dialysis ([place])    "[place]; TTS" (11/21/2017)  . GERD (gastroesophageal reflux disease)    has in the past  . Headache    since going on dialysis  . Hypertension   . Hypothyroidism   . PONV (postoperative nausea and vomiting)   . Restless legs   . Type II diabetes mellitus ([place])      Past Surgical History:  Procedure Laterality Date  . AV FISTULA PLACEMENT Right 10/07/2016   Procedure: RIGHT BRACHIOCEPHALIC ARTERIOVENOUS (AV) FISTULA CREATION;  Surgeon: Angelia Mould, MD;  Location: [place];  Service: Vascular;  Laterality: Right;  . BACK SURGERY    . CATARACT EXTRACTION W/ INTRAOCULAR LENS IMPLANT Left   . CIRCUMCISION  2015  . HERNIA REPAIR  2016   "stomach"  . LEFT HEART CATH AND CORONARY ANGIOGRAPHY N/A 06/27/2018   Procedure: LEFT HEART CATH AND CORONARY ANGIOGRAPHY;  Surgeon: Troy Sine, MD;  Location: [place] CV LAB;  Service: Cardiovascular;  Laterality: N/A;  . POSTERIOR LUMBAR FUSION  1990s?   "has bolts and rods in there"    Interim history/subjective:  Awake and cooperative on low-dose sedation.  Hemodialysis today with fluid removal.   Objective   Blood pressure (!) 139/59, pulse 74, temperature 99.6 F (37.6 C), temperature source Axillary, resp. rate 14, height $RemoveBe'5\' 5"'MFkhVivri$  (1.651 m), weight 70.3 kg, SpO2 97 %.    Vent Mode: CPAP;PSV FiO2 (%):  [40 %] 40 % Set Rate:  [24 bmp] 24 bmp Vt Set:  [500 mL] 500 mL PEEP:  [5 cmH20] 5 cmH20 Pressure Support:  [5 cmH20-8 cmH20] 5 cmH20 Plateau Pressure:  [5 cmH20-16 cmH20] 5 cmH20   Intake/Output Summary (Last 24 hours) at 06/30/2018 1416 Last data filed at 06/30/2018 1200 Gross per 24 hour  Intake 1104.7 ml  Output 1620 ml  Net -515.3 ml   Filed Weights   06/29/18 1312 06/30/18 0441 06/30/18 1130  Weight: 70.9 kg 71.8 kg 70.3 kg    Examination: Physical Exam Constitutional:      General: He is not in acute distress. HENT:     Mouth/Throat:     Mouth: Mucous membranes are moist.  Cardiovascular:     Rate and Rhythm: Normal rate and regular rhythm.     Heart sounds: Normal heart sounds.  Pulmonary:     Effort: Pulmonary effort is normal.     Breath sounds: Normal breath sounds.     Comments: Tolerated SBT Abdominal:     General: Abdomen is flat.     Palpations: Abdomen is soft.  Musculoskeletal:        General: No swelling.  Skin:    Capillary Refill: Capillary refill takes less than 2 seconds.  Neurological:     General: No focal deficit present.      Ancillary tests (personally reviewed)  CBC: Recent Labs  Lab 06/24/18 0639  06/26/18 0519  06/27/18 0437 06/27/18 0738 06/27/18 1102 06/28/18 0459 06/29/18 0518 06/30/18 0320  WBC 23.4*   < > 11.8*  --  13.9*  --   --  10.1 5.4 5.9  NEUTROABS 19.3*  --   --   --   --   --   --  7.7  --   --   HGB 11.3*   < > 10.9*   < > 10.9* 9.5* 9.5* 9.0* 8.5* 8.3*  HCT 34.7*   < > 34.8*   < > 36.7* 28.0* 28.0* 30.4* 28.7* 27.4*  MCV 90.6   < > 98.9  --  100.5*  --   --  100.0 98.3 96.1  PLT 281   < > 147*  --  180  --   --  158 123* 119*   < > = values in this interval not displayed.    Basic Metabolic Panel:  Recent Labs  Lab 06/25/18 1442 06/26/18 0519 06/26/18 2153  06/27/18 0437 06/27/18 0738 06/27/18 1102 06/28/18 0459 06/29/18 0518 06/30/18 0320  NA 136 138 136   < > 139 142 143 143 143 138  K 4.4 4.3 3.9   < > 4.1 3.7 3.6 4.1 3.3* 3.6  CL 104 103 105  --  103  --   --  107 109 102  CO2 24 23 19*  --  15*  --   --  17* 21* 24  GLUCOSE 152* 131* 109*  --  157*  --   --  151* 177* 234*  BUN 25* 17 23  --  26*  --   --  34* 49* 34*  CREATININE 3.43* 2.86* 3.20*  --  4.26*  --   --  6.09* 7.44* 4.23*  CALCIUM 7.5* 7.8* 7.8*  --  8.1*  --   --  7.7* 7.6* 7.4*  MG  --  2.3 2.5*  --  2.6*  --   --  2.5* 2.4 1.9  PHOS 2.8 3.8  --   --  3.9  --   --  4.7*  --  3.1   < > = values in this interval not displayed.   GFR: Estimated Creatinine Clearance: 14.3 mL/min (A) (by C-G formula based on SCr of 4.23 mg/dL (H)). Recent Labs  Lab 06/24/18 0650 06/24/18 0803 06/25/18 0443  06/27/18 0437 06/28/18 0459 06/29/18 0518 06/30/18 0320  WBC  --   --  18.9*   < > 13.9* 10.1 5.4 5.9  LATICACIDVEN 1.9 1.6 1.4  --   --   --   --   --    < > = values in this interval not displayed.    Liver Function Tests: Recent Labs  Lab 06/24/18 6010 06/25/18 0443 06/25/18 1442 06/26/18 0519 06/30/18 0320  AST 18  --   --   --   --   ALT 11  --   --   --   --   ALKPHOS 83  --   --   --   --   BILITOT 0.7  --   --   --   --   PROT 4.9*  --   --   --   --   ALBUMIN 2.5*  2.5* 2.5* 2.8* 2.1*   No results for input(s): LIPASE, AMYLASE in the last 168 hours. No results for input(s): AMMONIA in the last 168 hours.  ABG    Component Value Date/Time   PHART 7.456 (H) 06/27/2018 1102   PCO2ART 34.3 06/27/2018 1102   PO2ART 495.0 (H) 06/27/2018 1102   HCO3 24.1 06/27/2018 1102   TCO2 25 06/27/2018 1102   ACIDBASEDEF 4.0 (H) 06/27/2018 0738   O2SAT 100.0 06/27/2018 1102     Coagulation Profile: No results for input(s): INR, PROTIME in the last 168 hours.  Cardiac Enzymes: Recent Labs  Lab  06/26/18 2153 06/27/18 0437 06/27/18 1012  TROPONINI 2.05* 2.65* 2.75*    HbA1C: Hgb A1c MFr Bld  Date/Time Value Ref Range Status  11/21/2017 07:05 PM 9.1 (H) 4.8 - 5.6 % Final    Comment:    (NOTE) Pre diabetes:          5.7%-6.4% Diabetes:              >6.4% Glycemic control for   <7.0% adults with diabetes   03/11/2016 04:12 AM 6.7 (H) 4.8 - 5.6 % Final    Comment:    (NOTE)         Pre-diabetes: 5.7 - 6.4         Diabetes: >6.4         Glycemic control for adults with diabetes: <7.0     CBG: Recent Labs  Lab 06/29/18 1459 06/29/18 2011 06/29/18 2339 06/30/18 0326 06/30/18 0754  GLUCAP 170* 255* 132* 185* 252*     Assessment & Plan:  Critically ill due to acute respiratory failure requiring mechanical ventilation. Passed SBT today Community-acquired pneumonia Pulmonary edema secondary to volume overload congestive heart failure Status post VT arrest due to QT prolongation Prior septic shock now resolved ESRD on baseline intermittent hemodialysis GERD Hypothyroidism  Plan:  Extubate today Continue hemodialysis as per nephrology Complete course of ceftriaxone for committee acquired pneumonia Avoid QT prolonging medication. Stop all IV sedation. If does well can transfer to floor tomorrow  Best practice:  Diet: Progressed to oral diet Pain/Anxiety/Delirium protocol (if indicated): Off all sedation VAP protocol (if indicated): Discontinued DVT prophylaxis: Subcutaneous heparin GI prophylaxis: Continue home PPI Urinary catheter: Not present Glucose control: Phase 1 protocol Mobility: Progressive ambulation, physiotherapy consult Code Status: Full code Family Communication: Family not updated today Disposition: Observe overnight if no need for reintubation can transfer to floor   Critical care time: 45 min including chart data review, examination of patient, multidisciplinary rounds, and frequent assessment and modification of ventilator therapy  and monitoring of SBT.   Kipp Brood, MD [facility] ICU Physician [place]  Pager: [phone] Mobile: [phone] After hours: [phone].  06/30/2018, 2:16 PM

## 2018-06-30 NOTE — Plan of Care (Signed)
Elink:  Attempted to phone call with video with wife x 6.

## 2018-07-01 ENCOUNTER — Encounter (HOSPITAL_COMMUNITY): Payer: Self-pay

## 2018-07-01 ENCOUNTER — Inpatient Hospital Stay (HOSPITAL_COMMUNITY): Payer: Medicare HMO

## 2018-07-01 LAB — CBC
HCT: 30.1 % — ABNORMAL LOW (ref 39.0–52.0)
Hemoglobin: 9.3 g/dL — ABNORMAL LOW (ref 13.0–17.0)
MCH: 29.5 pg (ref 26.0–34.0)
MCHC: 30.9 g/dL (ref 30.0–36.0)
MCV: 95.6 fL (ref 80.0–100.0)
Platelets: 133 10*3/uL — ABNORMAL LOW (ref 150–400)
RBC: 3.15 MIL/uL — ABNORMAL LOW (ref 4.22–5.81)
RDW: 14.6 % (ref 11.5–15.5)
WBC: 5.8 10*3/uL (ref 4.0–10.5)
nRBC: 0 % (ref 0.0–0.2)

## 2018-07-01 LAB — GLUCOSE, CAPILLARY
Glucose-Capillary: 134 mg/dL — ABNORMAL HIGH (ref 70–99)
Glucose-Capillary: 148 mg/dL — ABNORMAL HIGH (ref 70–99)
Glucose-Capillary: 147 mg/dL — ABNORMAL HIGH (ref 70–99)
Glucose-Capillary: 148 mg/dL — ABNORMAL HIGH (ref 70–99)
Glucose-Capillary: 123 mg/dL — ABNORMAL HIGH (ref 70–99)

## 2018-07-01 LAB — RENAL FUNCTION PANEL
Albumin: 2.1 g/dL — ABNORMAL LOW (ref 3.5–5.0)
Anion gap: 12 (ref 5–15)
BUN: 34 mg/dL — ABNORMAL HIGH (ref 8–23)
CO2: 24 mmol/L (ref 22–32)
Calcium: 7.4 mg/dL — ABNORMAL LOW (ref 8.9–10.3)
Chloride: 102 mmol/L (ref 98–111)
Creatinine, Ser: 4.23 mg/dL — ABNORMAL HIGH (ref 0.61–1.24)
GFR calc Af Amer: 15 mL/min — ABNORMAL LOW (ref >60)
GFR calc non Af Amer: 13 mL/min — ABNORMAL LOW (ref >60)
Glucose, Bld: 234 mg/dL — ABNORMAL HIGH (ref 70–99)
Phosphorus: 3.1 mg/dL (ref 2.5–4.6)
Potassium: 3.6 mmol/L (ref 3.5–5.1)
Sodium: 138 mmol/L (ref 135–145)

## 2018-07-01 MED ORDER — SODIUM CHLORIDE 0.9 % IV SOLN
125.0000 mg | INTRAVENOUS | Status: DC
  Administered 2018-07-03 – 2018-07-07 (×2): 125 mg via INTRAVENOUS
  Filled 2018-07-01 (×7): qty 10

## 2018-07-01 MED ORDER — SODIUM CHLORIDE 0.9 % IV SOLN
250.0000 mg | Freq: Once | INTRAVENOUS | Status: AC
  Administered 2018-07-01: 250 mg via INTRAVENOUS
  Filled 2018-07-01: qty 20

## 2018-07-01 MED ORDER — METOPROLOL TARTRATE 25 MG PO TABS
25.0000 mg | ORAL_TABLET | Freq: Two times a day (BID) | ORAL | Status: DC
  Administered 2018-07-01 – 2018-07-02 (×4): 25 mg via ORAL
  Filled 2018-07-01 (×4): qty 1

## 2018-07-01 NOTE — Plan of Care (Signed)
  Problem: Clinical Measurements: Goal: Will remain free from infection Outcome: Progressing Goal: Diagnostic test results will improve Outcome: Progressing   Problem: Pain Managment: Goal: General experience of comfort will improve Outcome: Progressing   Problem: Education: Goal: Understanding of CV disease, CV risk reduction, and recovery process will improve Outcome: Not Progressing Goal: Individualized Educational Video(s) Outcome: Not Progressing   Problem: Activity: Goal: Ability to return to baseline activity level will improve Outcome: Not Progressing   Problem: Health Behavior/Discharge Planning: Goal: Ability to safely manage health-related needs after discharge will improve Outcome: Not Progressing   Problem: Education: Goal: Knowledge of General Education information will improve Description Including pain rating scale, medication(s)/side effects and non-pharmacologic comfort measures Outcome: Not Progressing   Problem: Health Behavior/Discharge Planning: Goal: Ability to manage health-related needs will improve Outcome: Not Progressing   Problem: Clinical Measurements: Goal: Ability to maintain clinical measurements within normal limits will improve Outcome: Not Progressing   Problem: Activity: Goal: Risk for activity intolerance will decrease Outcome: Not Progressing   Problem: Nutrition: Goal: Adequate nutrition will be maintained Outcome: Not Progressing   Problem: Coping: Goal: Level of anxiety will decrease Outcome: Not Progressing   Problem: Safety: Goal: Ability to remain free from injury will improve Outcome: Not Progressing   Problem: Skin Integrity: Goal: Risk for impaired skin integrity will decrease Outcome: Not Progressing

## 2018-07-01 NOTE — Progress Notes (Signed)
Patient pulled out right IJ HD cath with pigtail. ELink cameraed in. Occlusive pressure dressing applied to site. Vital signs stable at this time. Will continue to monitor patient.

## 2018-07-01 NOTE — Progress Notes (Signed)
  Speech Language Pathology Treatment: Dysphagia  Patient Details Name: Alexander Whitaker MRN: [id number] DOB: 07/11/1950 Today's Date: 07/01/2018 Time: 2500-3704 SLP Time Calculation (min) (ACUTE ONLY): 19 min  Assessment / Plan / Recommendation Clinical Impression  SLP followed up for PO readiness. Pt was in chair at bedside, partially reclined despite repositioning attempts. Pt impulsive in rate of consumption. Consistent throat clear exhibited immediately following thin liquids concerning for decreased airway protection. Pt with prolonged mastication of solid PO (baseline edentulism) and one instance of overt coughing spell (30 seconds) following solid trial. Given symptomatic difficulty and multiple risk factors, will proceed with MBSS prior to diet initiation. MBSS planned 13:00 this date.    HPI HPI: 68 year old male with history of esrd on hd, cva, htn, chf, presented to [place] with ams.  He had fever of 101.6 at [place] and only responsive to deep stimuli.  Cxr showed diffuse infiltrates. Inutbated from 3/28 until 4/4. VT arrest requiring amiodarone and chest compressions on 06/27/2018, cath nonobstructive disease.      SLP Plan  MBS       Recommendations  Diet recommendations: Other(comment)(pending results of MBSS this date; PO meds in puree okay ) Medication Administration: Whole meds with puree Supervision: Full supervision/cueing for compensatory strategies Compensations: Minimize environmental distractions;Slow rate;Small sips/bites Postural Changes and/or Swallow Maneuvers: Seated upright 90 degrees;Upright 30-60 min after meal                Oral Care Recommendations: Oral care QID Follow up Recommendations: 24 hour supervision/assistance;Skilled Nursing facility SLP Visit Diagnosis: Dysphagia, oropharyngeal phase (R13.12) Plan: MBS       GO                Kynsie Falkner E Zairah Arista MA, CCC-SLP  Acute Rehab Speech Language Pathologist  07/01/2018, 11:54 AM

## 2018-07-01 NOTE — Evaluation (Signed)
Physical Therapy Evaluation Patient Details Name: Alexander Whitaker MRN: [id number] DOB: 1951-01-17 Today's Date: 07/01/2018   History of Present Illness  69 year old male with history of esrd on hd, cva, htn, chf, presented to [facility] with ams.  He had fever of 101.6 at [place] and only responsive to deep stimuli.  Cxr showed diffuse infiltrates. Inutbated from 3/28 until 4/4. VT arrest requiring amiodarone and chest compressions on 06/27/2018, cath nonobstructive disease.   Clinical Impression   Pt admitted with above diagnosis. Pt currently with functional limitations due to the deficits listed below (see PT Problem List). 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       Follow Up Recommendations CIR    Equipment Recommendations  Rolling walker with 5" wheels;3in1 (PT)    Recommendations for Other Services Rehab consult     Precautions / Restrictions Precautions Precautions: Fall      Mobility  Bed Mobility Overal bed mobility: Needs Assistance Bed Mobility: Supine to Sit     Supine to sit: Mod assist;+2 for physical assistance     General bed mobility comments: Heavy mod assist and use of bed pad to move hips to EOB and elevate trunk to sit  Transfers Overall transfer level: Needs assistance Equipment used: Rolling walker (2 wheeled) Transfers: Sit to/from Stand Sit to Stand: +2 physical assistance;+2 safety/equipment;Mod assist         General transfer comment: Heavy mod assist to power up to stand; Difficulty processing hand placement/RW use, so opted to proceend with 2 person handheld assist  Ambulation/Gait Ambulation/Gait assistance: +2 physical assistance;Mod assist Gait Distance  (Feet): 5 Feet Assistive device: 2 person hand held assist Gait Pattern/deviations: Shuffle     General Gait Details: Difficulty with weight shifting completely into single limb stance and unwighing swing LE for stepping; faciliation of weght shift; heavy R lean in standing; LOTS of difficulty with stepping backwards to reclienr  Stairs            Wheelchair Mobility    Modified Rankin (Stroke Patients Only)       Balance Overall balance assessment: Needs assistance   Sitting balance-Leahy Scale: Fair       Standing balance-Leahy Scale: Poor                               Pertinent Vitals/Pain Pain Assessment: Faces Faces Pain Scale: Hurts even more Pain Location: R rib pain Pain Descriptors / Indicators: Grimacing Pain Intervention(s): Monitored during session    Home Living Family/patient expects to be discharged to:: Private residence Living Arrangements: Spouse/significant other Available Help at Discharge: Family Type of Home: Mobile home Home Access: Stairs to enter Entrance Stairs-Rails: Psychiatric nurse of Steps: 6 Home Layout: One level Home Equipment: Shower seat - built in;Grab bars - tub/shower      Prior Function Level of Independence: Independent         Comments: drives, reports no hobbies     Hand Dominance   Dominant Hand: Right    Extremity/Trunk Assessment   Upper Extremity Assessment Upper Extremity Assessment: Generalized weakness    Lower Extremity Assessment Lower Extremity Assessment: Generalized weakness  Communication   Communication: No difficulties  Cognition Arousal/Alertness: Awake/alert Behavior During Therapy: WFL for tasks assessed/performed;Impulsive Overall Cognitive Status: No family/caregiver present to determine baseline cognitive functioning                                        General Comments General comments (skin integrity, edema, etc.):  Session conducted on Room Air; O2 sats and HR stable; BP elevated after trial walking    Exercises     Assessment/Plan    PT Assessment Patient needs continued PT services  PT Problem List Decreased strength;Decreased activity tolerance;Decreased balance;Decreased mobility;Decreased coordination;Decreased cognition;Decreased knowledge of use of DME;Decreased safety awareness;Decreased knowledge of precautions;Pain       PT Treatment Interventions DME instruction;Gait training;Stair training;Functional mobility training;Therapeutic activities;Therapeutic exercise;Balance training;Cognitive remediation;Patient/family education    PT Goals (Current goals can be found in the Care Plan section)  Acute Rehab PT Goals Patient Stated Goal: Tells me he wants to eat; then he asks if we can drive his truck home PT Goal Formulation: With patient Time For Goal Achievement: 07/15/18 Potential to Achieve Goals: Good    Frequency Min 3X/week   Barriers to discharge        Co-evaluation               AM-PAC PT "6 Clicks" Mobility  Outcome Measure Help needed turning from your back to your side while in a flat bed without using bedrails?: A Lot Help needed moving from lying on your back to sitting on the side of a flat bed without using bedrails?: A Lot Help needed moving to and from a bed to a chair (including a wheelchair)?: A Lot Help needed standing up from a chair using your arms (e.g., wheelchair or bedside chair)?: A Lot Help needed to walk in hospital room?: A Lot Help needed climbing 3-5 steps with a railing? : Total 6 Click Score: 11    End of Session Equipment Utilized During Treatment: Gait belt Activity Tolerance: Patient tolerated treatment well Patient left: in chair;with call bell/phone within reach;with chair alarm set Nurse Communication: Mobility status PT Visit Diagnosis: Unsteadiness on feet (R26.81);Other abnormalities of gait and mobility (R26.89)    Time:  1040-1055 PT Time Calculation (min) (ACUTE ONLY): 15 min   Charges:   PT Evaluation $PT Eval Moderate Complexity: 1 Mod          Roney [place], [place]  Acute Rehabilitation Services Pager [phone] Office [phone]   [place]ta [place] 07/01/2018, 1:09 PM

## 2018-07-01 NOTE — Progress Notes (Signed)
Rehab Admissions Coordinator Note:  Patient was screened by Retta Diones for appropriateness for an Inpatient Acute Rehab Consult.  At this time, we are recommending Inpatient Rehab consult.  Jodell Cipro M 07/01/2018, 4:12 PM  I can be reached at [phone].

## 2018-07-01 NOTE — Progress Notes (Signed)
NAME:  Alexander Whitaker, MRN:  [id number], DOB:  05-12-1950, LOS: 7 ADMISSION DATE:  06/24/2018, CONSULTATION DATE:  3/29/202 REFERRING MD:  Oval Linsey, CHIEF COMPLAINT:  AMS/resp failure   Brief History   tx from [facility] for AMS/resp failure  History of present illness   68 year old male with history of esrd on hd, cva, htn, chf, presented to [facility] with ams.  He had fever of 101.6 at  and only responsive to deep stimuli.  He was intubated.  Cxr showed diffuse infiltrates. Blood sugar of 638.  statted on isulin gtt.   He was given vanc, ceft, azithro.  He was tx today for icu management  Past Medical History  esrd on hd cva  htn chf  Significant Hospital Events   ett 06/23/18>4/4 Femoral aline 06/24/18>4/2 RIJ 06/24/18>4/4  Consults:  Nephrology  Cardiology EP  Procedures:  ett 06/23/18 Femoral aline 06/24/18 RIJ 06/24/18  Significant Diagnostic Tests:  cxr 06/23/18 with diffuse infiltrates CXR 3/30- bilateral opacities, personally reviewed  Cardiac Cath 4/1- nonobstructive CAD CXR 4/3- improving bil infiltrates Extubated 4/4  Micro Data:  Sputum cx 06/24/18> rare budding yeast > candida glabrata Blood cx 06/24/18 > Negative final MRSA PCR 3/29 negative  Antimicrobials:  vanc 06/23/18- 3/31 ceft 06/23/18>>4/4 azithro 06/23/18>>4/2  Interim history/subjective:  Patient pulled out HD cath overnight. He denies CP, shortness of breath, abd pain.  Objective   Blood pressure (!) 178/108, pulse (!) 110, temperature 98.7 F (37.1 C), temperature source Oral, resp. rate (!) 22, height $RemoveBe'5\' 5"'OLWPqzVDS$  (1.651 m), weight 70.3 kg, SpO2 97 %.    FiO2 (%):  [40 %] 40 %   Intake/Output Summary (Last 24 hours) at 07/01/2018 0947 Last data filed at 07/01/2018 0900 Gross per 24 hour  Intake 437.04 ml  Output 1620 ml  Net -1182.96 ml   Filed Weights   06/29/18 1312 06/30/18 0441 06/30/18 1130  Weight: 70.9 kg 71.8 kg 70.3 kg    Examination: General: chronically ill appearing  male, NAD HENT: PERRL, EOMI Lungs: CTAB, no increased work of breathing Cardiovascular: RRR, Nl S1/S2 and -M/R/G Abdomen: Soft, NT, ND and +BS Extremities: -edema and -tenderness Neuro: Awake and alert, oriented only to self. Strength and sensation intact throughout, face symmetric. Skin: clean, dry, warm   Resolved Hospital Problem list   Septic shock Acute Respiratory Failure with hypoxia CAP  Pulmonary Edema  Assessment & Plan:   Acute Respiratory Failure with hypoxia - extubated 4/4 CAP  Pulmonary Edema Completed rocephin/azithro for CAP Extubated 4/4, off of supplemental oxygen 4/5 Currently below dry weight, nephro managing HD  VT arrest requiring amiodarone and chest compressions on 06/27/2018, cath nonobstructive disease.  Last 2D echo 10/2017 showed EF of 60 to 65% he is a known history of congestive heart failure, grade 1 diastolic dysfunction was noted per Dr. Radford Pax. COVID negative from [facility] - scanned in chart. -maintain K >3.9 and Mag >1.9 -start metoprolol $RemoveBeforeDEI'25mg'ofFTPYPZrMKWdJAk$  BID per cards  DM -Sliding scale insulin  End-stage renal disease.   Had CRRT briefly this admission due to pressor requirement, now on iHD per nephro. Below dry weight.  Hypothyroidism -Currently on Synthroid  GERD  -Currently on PPI  Deconditioning - pt/ot consults - slp for swallow eval  Best practice:  Diet: NPO, pending swallow eval Pain/Anxiety/Delirium protocol (if indicated): n/a VAP protocol (if indicated): n/a DVT prophylaxis: SQ Heparin  GI prophylaxis: protonix Glucose control: SSI Mobility: bedrest  Code Status: full code  Family Communication: 07/01/2018 RN updated  family Disposition: transfer to PCU  Labs   CBC: Recent Labs  Lab 06/27/18 0437  06/27/18 1102 06/28/18 0459 06/29/18 0518 06/30/18 0320 07/01/18 0227  WBC 13.9*  --   --  10.1 5.4 5.9 5.8  NEUTROABS  --   --   --  7.7  --   --   --   HGB 10.9*   < > 9.5* 9.0* 8.5* 8.3* 9.3*  HCT 36.7*   < > 28.0* 30.4*  28.7* 27.4* 30.1*  MCV 100.5*  --   --  100.0 98.3 96.1 95.6  PLT 180  --   --  158 123* 119* 133*   < > = values in this interval not displayed.    Basic Metabolic Panel: Recent Labs  Lab 06/25/18 1442 06/26/18 0519 06/26/18 2153  06/27/18 0437 06/27/18 0738 06/27/18 1102 06/28/18 0459 06/29/18 0518 06/30/18 0320  NA 136 138 136   < > 139 142 143 143 143 138  K 4.4 4.3 3.9   < > 4.1 3.7 3.6 4.1 3.3* 3.6  CL 104 103 105  --  103  --   --  107 109 102  CO2 24 23 19*  --  15*  --   --  17* 21* 24  GLUCOSE 152* 131* 109*  --  157*  --   --  151* 177* 234*  BUN 25* 17 23  --  26*  --   --  34* 49* 34*  CREATININE 3.43* 2.86* 3.20*  --  4.26*  --   --  6.09* 7.44* 4.23*  CALCIUM 7.5* 7.8* 7.8*  --  8.1*  --   --  7.7* 7.6* 7.4*  MG  --  2.3 2.5*  --  2.6*  --   --  2.5* 2.4 1.9  PHOS 2.8 3.8  --   --  3.9  --   --  4.7*  --  3.1   < > = values in this interval not displayed.   GFR: Estimated Creatinine Clearance: 14.3 mL/min (A) (by C-G formula based on SCr of 4.23 mg/dL (H)). Recent Labs  Lab 06/25/18 0443  06/28/18 0459 06/29/18 0518 06/30/18 0320 07/01/18 0227  WBC 18.9*   < > 10.1 5.4 5.9 5.8  LATICACIDVEN 1.4  --   --   --   --   --    < > = values in this interval not displayed.    Liver Function Tests: Recent Labs  Lab 06/25/18 0443 06/25/18 1442 06/26/18 0519 06/30/18 0320  ALBUMIN 2.5* 2.5* 2.8* 2.1*   No results for input(s): LIPASE, AMYLASE in the last 168 hours. No results for input(s): AMMONIA in the last 168 hours.  ABG    Component Value Date/Time   PHART 7.456 (H) 06/27/2018 1102   PCO2ART 34.3 06/27/2018 1102   PO2ART 495.0 (H) 06/27/2018 1102   HCO3 24.1 06/27/2018 1102   TCO2 25 06/27/2018 1102   ACIDBASEDEF 4.0 (H) 06/27/2018 0738   O2SAT 100.0 06/27/2018 1102     Coagulation Profile: No results for input(s): INR, PROTIME in the last 168 hours.  Cardiac Enzymes: Recent Labs  Lab 06/26/18 2153 06/27/18 0437 06/27/18 1012   TROPONINI 2.05* 2.65* 2.75*    HbA1C: Hgb A1c MFr Bld  Date/Time Value Ref Range Status  11/21/2017 07:05 PM 9.1 (H) 4.8 - 5.6 % Final    Comment:    (NOTE) Pre diabetes:          5.7%-6.4% Diabetes:              >  6.4% Glycemic control for   <7.0% adults with diabetes   03/11/2016 04:12 AM 6.7 (H) 4.8 - 5.6 % Final    Comment:    (NOTE)         Pre-diabetes: 5.7 - 6.4         Diabetes: >6.4         Glycemic control for adults with diabetes: <7.0    CBG: Recent Labs  Lab 06/30/18 1633 06/30/18 2035 06/30/18 2331 07/01/18 0317 07/01/18 0737  GLUCAP 140* 111* 115* [place], MD IMTS - PGY3 Pager [phone]  07/01/2018, 9:47 AM

## 2018-07-01 NOTE — Progress Notes (Signed)
Progress Note  Patient Name: Alexander Whitaker Date of Encounter: 07/01/2018  Primary Cardiologist: Glenetta Hew, MD   Subjective   No complaints.  Inpatient Medications    Scheduled Meds: . aspirin  81 mg Per Tube Daily  . chlorhexidine  15 mL Mouth Rinse BID  . Chlorhexidine Gluconate Cloth  6 each Topical Q0600  . [START ON 07/03/2018] darbepoetin (ARANESP) injection - NON-DIALYSIS  150 mcg Subcutaneous Q Tue-HD  . heparin  5,000 Units Subcutaneous Q8H  . insulin aspart  0-24 Units Subcutaneous Q4H  . levothyroxine  50 mcg Per Tube Q0600  . mouth rinse  15 mL Mouth Rinse q12n4p  . metoprolol tartrate  25 mg Oral BID  . pantoprazole sodium  40 mg Per Tube Q24H  . sodium chloride flush  3 mL Intravenous Q12H   Continuous Infusions: . sodium chloride Stopped (07/01/18 0853)  . [START ON 07/03/2018] ferric gluconate (FERRLECIT/NULECIT) IV    . sodium chloride 250 mL (06/28/18 1511)   PRN Meds: sodium chloride, acetaminophen, hydrALAZINE, ondansetron (ZOFRAN) IV, sodium chloride, sodium chloride flush   Vital Signs    Vitals:   07/01/18 0700 07/01/18 0739 07/01/18 0800 07/01/18 0900  BP: (!) 152/51  (!) 149/56 (!) 178/108  Pulse: 87  91 (!) 110  Resp: (!) 27  17 (!) 22  Temp:  98.7 F (37.1 C)    TempSrc:  Oral    SpO2: 96%  96% 97%  Weight:      Height:        Intake/Output Summary (Last 24 hours) at 07/01/2018 0931 Last data filed at 07/01/2018 0900 Gross per 24 hour  Intake 437.04 ml  Output 1620 ml  Net -1182.96 ml   Filed Weights   06/29/18 1312 06/30/18 0441 06/30/18 1130  Weight: 70.9 kg 71.8 kg 70.3 kg    Telemetry    nsr - Personally Reviewed  ECG    none - Personally Reviewed  Physical Exam   GEN: No acute distress.   Neck: No JVD Cardiac: RRR, no murmurs, rubs, or gallops.  Respiratory: Clear to auscultation bilaterally. GI: Soft, nontender, non-distended  MS: No edema; No deformity. Neuro:  Nonfocal  Psych: Normal affect   Labs     Chemistry Recent Labs  Lab 06/25/18 1442 06/26/18 0519  06/28/18 0459 06/29/18 0518 06/30/18 0320  NA 136 138   < > 143 143 138  K 4.4 4.3   < > 4.1 3.3* 3.6  CL 104 103   < > 107 109 102  CO2 24 23   < > 17* 21* 24  GLUCOSE 152* 131*   < > 151* 177* 234*  BUN 25* 17   < > 34* 49* 34*  CREATININE 3.43* 2.86*   < > 6.09* 7.44* 4.23*  CALCIUM 7.5* 7.8*   < > 7.7* 7.6* 7.4*  ALBUMIN 2.5* 2.8*  --   --   --  2.1*  GFRNONAA 17* 21*   < > 9* 7* 13*  GFRAA 20* 25*   < > 10* 8* 15*  ANIONGAP 8 12   < > 19* 13 12   < > = values in this interval not displayed.     Hematology Recent Labs  Lab 06/29/18 0518 06/30/18 0320 07/01/18 0227  WBC 5.4 5.9 5.8  RBC 2.92* 2.85* 3.15*  HGB 8.5* 8.3* 9.3*  HCT 28.7* 27.4* 30.1*  MCV 98.3 96.1 95.6  MCH 29.1 29.1 29.5  MCHC 29.6* 30.3 30.9  RDW  15.4 14.7 14.6  PLT 123* 119* 133*    Cardiac Enzymes Recent Labs  Lab 06/26/18 2153 06/27/18 0437 06/27/18 1012  TROPONINI 2.05* 2.65* 2.75*   No results for input(s): TROPIPOC in the last 168 hours.   BNPNo results for input(s): BNP, PROBNP in the last 168 hours.   DDimer No results for input(s): DDIMER in the last 168 hours.   Radiology    No results found.  Cardiac Studies   Cath noted  Patient Profile     68 y.o. male admitted with fever and sob, subsequently develoed PMVT in the setting of long QT in the setting of QT prolonging drugs.   Assessment & Plan    1. PMVT - he has had none in the last 48 hours.  2. Long QT - I suspect he has underlying long QT with minimal penetrance which came out in the setting of amio and azithromycin. Would avoid all QT prolonging drugs, keep potassium and magnesium replete and start metoprolol 25 mg twice daily. He could be switched to other beta blockers in the future but metoprolol ok for now.  3. Respiratory failure - resolved.     For questions or updates, please contact [place] Please consult [email or web address] for contact info under  Cardiology/STEMI.   Signed, Cristopher Peru, MD  07/01/2018, 9:31 AM  Patient ID: Alexander Whitaker, male   DOB: 09-Aug-1950, 68 y.o.   MRN: [id number]

## 2018-07-01 NOTE — Progress Notes (Signed)
Modified Barium Swallow Progress Note  Patient Details  Name: Alexander Whitaker MRN: [id number] Date of Birth: 1950/12/30  Today's Date: 07/01/2018  Modified Barium Swallow completed.  Full report located under Chart Review in the Imaging Section.  Brief recommendations include the following:  Clinical Impression  Pt presents with a mild oropharyngeal dysphagia. Oral deficitc c/b delayed AP transit, lingual pumping, reduced bolus cohesion, and mild oral residuals. Pt unable to propel 13 mm barium tablet to hypopharynx and eventually expecorated from oral cavity. Pharyngeal deficits c/b one instance of mistimed epiglottic inversion with reduced laryngeal vestibule closure resulting in penetration of thin liquids before the swallow (on initial thin series). No aspiration evidenced, no further penetration exhibited. Recommend dysphagia 2 (fine chopped) and thin liquids with medicines as tolerated and safe swallow strategies including full supervision with all PO. SLP to follow up.   Swallow Evaluation Recommendations       SLP Diet Recommendations: Dysphagia 2 (Fine chop) solids;Thin liquid   Liquid Administration via: Cup;Straw   Medication Administration: Whole meds with puree(as tolerated)   Supervision: Full supervision/cueing for compensatory strategies;Staff to assist with self feeding   Compensations: Minimize environmental distractions;Slow rate;Small sips/bites   Postural Changes: Seated upright at 90 degrees;Remain semi-upright after after feeds/meals (Comment)   Oral Care Recommendations: Oral care BID        Florentine Diekman E Kamisha Ell MA, CCC-SLP 07/01/2018,1:48 PM

## 2018-07-01 NOTE — Progress Notes (Signed)
Alexander Whitaker Progress Note  Subjective: extubated yesterday, awake and pleasant, responding, but not oriented   Vitals:   07/01/18 0739 07/01/18 0800 07/01/18 0900 07/01/18 1000  BP:  (!) 149/56 (!) 178/108 (!) 167/64  Pulse:  91 (!) 110 89  Resp:  17 (!) 22 18  Temp: 98.7 F (37.1 C)     TempSrc: Oral     SpO2:  96% 97% 97%  Weight:      Height:        Inpatient medications: . aspirin  81 mg Per Tube Daily  . chlorhexidine  15 mL Mouth Rinse BID  . Chlorhexidine Gluconate Cloth  6 each Topical Q0600  . [START ON 07/03/2018] darbepoetin (ARANESP) injection - NON-DIALYSIS  150 mcg Subcutaneous Q Tue-HD  . heparin  5,000 Units Subcutaneous Q8H  . insulin aspart  0-24 Units Subcutaneous Q4H  . levothyroxine  50 mcg Per Tube Q0600  . mouth rinse  15 mL Mouth Rinse q12n4p  . metoprolol tartrate  25 mg Oral BID  . pantoprazole sodium  40 mg Per Tube Q24H  . sodium chloride flush  3 mL Intravenous Q12H   . sodium chloride 10 mL/hr at 07/01/18 1000  . [START ON 07/03/2018] ferric gluconate (FERRLECIT/NULECIT) IV    . sodium chloride 250 mL (06/28/18 1511)   sodium chloride, acetaminophen, hydrALAZINE, ondansetron (ZOFRAN) IV, sodium chloride, sodium chloride flush  Iron/TIBC/Ferritin/ %Sat    Component Value Date/Time   IRON 13 (L) 06/30/2018 1552   TIBC 125 (L) 06/30/2018 1552   FERRITIN 1,274 (H) 06/30/2018 1552   IRONPCTSAT 10 (L) 06/30/2018 1552    Exam:  lying flat, nasal O2, wide awake and conversant  no jvd Chest clear ant/ lat, no rales or wheezing Cor RRR no RG  Abd slight distended, soft +bs  Ext no LE edema, +RUE edema  R arm AVF+bruit NF oriented to person only      [facility]  4h  76.5kg   2/2.25 bath  Hep none  RUE AVF  400/800  - Micera 200 mcg IV every 2 weeks  - Venofer $RemoveB'50mg'oCuFeCCH$  IV once per week      Assessment/Plan: 1.  HCAP/ severe sepsis/ fever/ pulm infiltrates - fevers/ hypothermia now resolved, normothermic. Is getting IV abx  w Rocephin D#7. CXR has cleared for the most part.  COVID testing was negative.  2. VT cardiac arrest : on 4/1, 5 min CPR responded to IV amio 3. VDRF - extubated 4/4, looks good 4.  ESRD - usual HD is TTS. CVVHD here 3/29- 31. Regular HD Sat. Plan next HD Tuesday.  5.  Hypertension/volume  - CXR normal 4/3, under dry wt. 1.5 L pulled on HD yest, tolerated well.  6.  Anemia CKD  - slow drop 11 >> 8's.  Got darbe 150 ug on 4/4 then weekly. Tsat low 10%, starting IV Fe load today.  7.  MBD ckd  -  Ca/phos ok, hold meds for now 8.  Nutrition - on tube feeds 9. AMS- due to pna and sepsis/ arrest. Disoriented but alert and responding appropriately.    [facility] 07/01/2018, 10:20 AM  Recent Labs  Lab 06/26/18 0519  06/28/18 0459 06/29/18 0518 06/30/18 0320  NA 138   < > 143 143 138  K 4.3   < > 4.1 3.3* 3.6  CL 103   < > 107 109 102  CO2 23   < > 17* 21* 24  GLUCOSE  131*   < > 151* 177* 234*  BUN 17   < > 34* 49* 34*  CREATININE 2.86*   < > 6.09* 7.44* 4.23*  CALCIUM 7.8*   < > 7.7* 7.6* 7.4*  PHOS 3.8   < > 4.7*  --  3.1  ALBUMIN 2.8*  --   --   --  2.1*   < > = values in this interval not displayed.   No results for input(s): AST, ALT, ALKPHOS, BILITOT, PROT in the last 168 hours. Recent Labs  Lab 06/28/18 0459  06/30/18 0320 07/01/18 0227  WBC 10.1   < > 5.9 5.8  NEUTROABS 7.7  --   --   --   HGB 9.0*   < > 8.3* 9.3*  HCT 30.4*   < > 27.4* 30.1*  MCV 100.0   < > 96.1 95.6  PLT 158   < > 119* 133*   < > = values in this interval not displayed.

## 2018-07-02 DIAGNOSIS — I5043 Acute on chronic combined systolic (congestive) and diastolic (congestive) heart failure: Secondary | ICD-10-CM

## 2018-07-02 DIAGNOSIS — R1310 Dysphagia, unspecified: Secondary | ICD-10-CM

## 2018-07-02 DIAGNOSIS — Z8673 Personal history of transient ischemic attack (TIA), and cerebral infarction without residual deficits: Secondary | ICD-10-CM

## 2018-07-02 DIAGNOSIS — D638 Anemia in other chronic diseases classified elsewhere: Secondary | ICD-10-CM

## 2018-07-02 DIAGNOSIS — R9431 Abnormal electrocardiogram [ECG] [EKG]: Secondary | ICD-10-CM

## 2018-07-02 LAB — RENAL FUNCTION PANEL
Albumin: 2.3 g/dL — ABNORMAL LOW (ref 3.5–5.0)
Anion gap: 13 (ref 5–15)
BUN: 29 mg/dL — ABNORMAL HIGH (ref 8–23)
CO2: 23 mmol/L (ref 22–32)
Calcium: 7.8 mg/dL — ABNORMAL LOW (ref 8.9–10.3)
Chloride: 103 mmol/L (ref 98–111)
Creatinine, Ser: 4.95 mg/dL — ABNORMAL HIGH (ref 0.61–1.24)
GFR calc Af Amer: 13 mL/min — ABNORMAL LOW (ref >60)
GFR calc non Af Amer: 11 mL/min — ABNORMAL LOW (ref >60)
Glucose, Bld: 130 mg/dL — ABNORMAL HIGH (ref 70–99)
Phosphorus: 3.9 mg/dL (ref 2.5–4.6)
Potassium: 3.4 mmol/L — ABNORMAL LOW (ref 3.5–5.1)
Sodium: 139 mmol/L (ref 135–145)

## 2018-07-02 LAB — CBC
HCT: 28.8 % — ABNORMAL LOW (ref 39.0–52.0)
Hemoglobin: 9.1 g/dL — ABNORMAL LOW (ref 13.0–17.0)
MCH: 29.4 pg (ref 26.0–34.0)
MCHC: 31.6 g/dL (ref 30.0–36.0)
MCV: 92.9 fL (ref 80.0–100.0)
Platelets: 179 10*3/uL (ref 150–400)
RBC: 3.1 MIL/uL — ABNORMAL LOW (ref 4.22–5.81)
RDW: 14.4 % (ref 11.5–15.5)
WBC: 4.7 10*3/uL (ref 4.0–10.5)
nRBC: 0 % (ref 0.0–0.2)

## 2018-07-02 LAB — GLUCOSE, CAPILLARY
Glucose-Capillary: 105 mg/dL — ABNORMAL HIGH (ref 70–99)
Glucose-Capillary: 119 mg/dL — ABNORMAL HIGH (ref 70–99)
Glucose-Capillary: 120 mg/dL — ABNORMAL HIGH (ref 70–99)
Glucose-Capillary: 128 mg/dL — ABNORMAL HIGH (ref 70–99)
Glucose-Capillary: 136 mg/dL — ABNORMAL HIGH (ref 70–99)
Glucose-Capillary: 163 mg/dL — ABNORMAL HIGH (ref 70–99)
Glucose-Capillary: 249 mg/dL — ABNORMAL HIGH (ref 70–99)

## 2018-07-02 LAB — MAGNESIUM: Magnesium: 2 mg/dL (ref 1.7–2.4)

## 2018-07-02 MED ORDER — LABETALOL HCL 5 MG/ML IV SOLN
10.0000 mg | Freq: Once | INTRAVENOUS | Status: AC
  Administered 2018-07-02: 10 mg via INTRAVENOUS
  Filled 2018-07-02: qty 4

## 2018-07-02 MED ORDER — INSULIN ASPART 100 UNIT/ML ~~LOC~~ SOLN
0.0000 [IU] | Freq: Three times a day (TID) | SUBCUTANEOUS | Status: DC
  Administered 2018-07-02: 2 [IU] via SUBCUTANEOUS
  Administered 2018-07-02 – 2018-07-03 (×2): 3 [IU] via SUBCUTANEOUS
  Administered 2018-07-03: 14:00:00 1 [IU] via SUBCUTANEOUS
  Administered 2018-07-04: 3 [IU] via SUBCUTANEOUS
  Administered 2018-07-04 (×2): 2 [IU] via SUBCUTANEOUS
  Administered 2018-07-05: 3 [IU] via SUBCUTANEOUS
  Administered 2018-07-05: 2 [IU] via SUBCUTANEOUS
  Administered 2018-07-06 (×2): 3 [IU] via SUBCUTANEOUS
  Administered 2018-07-06: 2 [IU] via SUBCUTANEOUS
  Administered 2018-07-07: 3 [IU] via SUBCUTANEOUS
  Administered 2018-07-07: 5 [IU] via SUBCUTANEOUS
  Administered 2018-07-08: 3 [IU] via SUBCUTANEOUS
  Administered 2018-07-08: 5 [IU] via SUBCUTANEOUS
  Administered 2018-07-08: 3 [IU] via SUBCUTANEOUS
  Administered 2018-07-09: 9 [IU] via SUBCUTANEOUS
  Administered 2018-07-09: 5 [IU] via SUBCUTANEOUS

## 2018-07-02 MED ORDER — RENA-VITE PO TABS
1.0000 | ORAL_TABLET | Freq: Every day | ORAL | Status: DC
  Administered 2018-07-02 – 2018-07-08 (×7): 1 via ORAL
  Filled 2018-07-02 (×7): qty 1

## 2018-07-02 MED ORDER — PANTOPRAZOLE SODIUM 40 MG PO PACK
40.0000 mg | PACK | ORAL | Status: DC
  Administered 2018-07-02 – 2018-07-03 (×2): 40 mg via ORAL
  Filled 2018-07-02 (×3): qty 20

## 2018-07-02 MED ORDER — AMLODIPINE BESYLATE 10 MG PO TABS
10.0000 mg | ORAL_TABLET | Freq: Every day | ORAL | Status: DC
  Administered 2018-07-02 – 2018-07-08 (×6): 10 mg via ORAL
  Filled 2018-07-02 (×7): qty 1

## 2018-07-02 MED ORDER — PRO-STAT SUGAR FREE PO LIQD
30.0000 mL | Freq: Two times a day (BID) | ORAL | Status: DC
  Administered 2018-07-02 – 2018-07-09 (×11): 30 mL via ORAL
  Filled 2018-07-02 (×13): qty 30

## 2018-07-02 MED ORDER — CHLORHEXIDINE GLUCONATE CLOTH 2 % EX PADS
6.0000 | MEDICATED_PAD | Freq: Every day | CUTANEOUS | Status: DC
  Administered 2018-07-04 – 2018-07-05 (×2): 6 via TOPICAL

## 2018-07-02 MED ORDER — INSULIN ASPART 100 UNIT/ML ~~LOC~~ SOLN
0.0000 [IU] | Freq: Every day | SUBCUTANEOUS | Status: DC
  Administered 2018-07-04 – 2018-07-06 (×2): 2 [IU] via SUBCUTANEOUS
  Administered 2018-07-08: 4 [IU] via SUBCUTANEOUS

## 2018-07-02 MED ORDER — DARBEPOETIN ALFA 150 MCG/0.3ML IJ SOSY
150.0000 ug | PREFILLED_SYRINGE | INTRAMUSCULAR | Status: DC
  Administered 2018-07-03: 09:00:00 150 ug via INTRAVENOUS
  Filled 2018-07-02: qty 0.3

## 2018-07-02 NOTE — Progress Notes (Signed)
Progress Note  Patient Name: Alexander Whitaker Date of Encounter: 07/02/2018  Primary Cardiologist: Glenetta Hew, MD   Subjective   Feels better  Inpatient Medications    Scheduled Meds: . amLODipine  10 mg Oral Daily  . aspirin  81 mg Per Tube Daily  . chlorhexidine  15 mL Mouth Rinse BID  . Chlorhexidine Gluconate Cloth  6 each Topical Q0600  . Chlorhexidine Gluconate Cloth  6 each Topical Q0600  . [START ON 07/03/2018] darbepoetin (ARANESP) injection - DIALYSIS  150 mcg Intravenous Q Tue-HD  . feeding supplement (PRO-STAT SUGAR FREE 64)  30 mL Oral BID  . heparin  5,000 Units Subcutaneous Q8H  . insulin aspart  0-24 Units Subcutaneous Q4H  . levothyroxine  50 mcg Per Tube Q0600  . mouth rinse  15 mL Mouth Rinse q12n4p  . metoprolol tartrate  25 mg Oral BID  . multivitamin  1 tablet Oral QHS  . pantoprazole sodium  40 mg Per Tube Q24H  . sodium chloride flush  3 mL Intravenous Q12H   Continuous Infusions: . sodium chloride Stopped (07/01/18 1007)  . [START ON 07/03/2018] ferric gluconate (FERRLECIT/NULECIT) IV    . sodium chloride 250 mL (06/28/18 1511)   PRN Meds: sodium chloride, acetaminophen, hydrALAZINE, ondansetron (ZOFRAN) IV, sodium chloride, sodium chloride flush   Vital Signs    Vitals:   07/02/18 0317 07/02/18 0324 07/02/18 0449 07/02/18 0522  BP: (!) 183/69 (!) 180/68 (!) 173/60 (!) 184/62  Pulse: 75 77 83 83  Resp: (!) 26     Temp: 98.6 F (37 C)     TempSrc: Oral     SpO2: 98%     Weight:      Height:        Intake/Output Summary (Last 24 hours) at 07/02/2018 0940 Last data filed at 07/02/2018 0520 Gross per 24 hour  Intake 130.24 ml  Output -  Net 130.24 ml   Last 3 Weights 07/02/2018 07/01/2018 06/30/2018  Weight (lbs) 160 lb 11.5 oz 159 lb 6.4 oz 154 lb 15.7 oz  Weight (kg) 72.9 kg 72.303 kg 70.3 kg      Telemetry    Sinus  at 86- Personally Reviewed  ECG    Normal sinus rhythm at 85 bpm.  Diffuse anterolateral T wave inversion, improved  slightly from 06/29/2018 tracing.  Prolonged QTc interval at 609 ms- Personally Reviewed  Physical Exam   GEN: No acute distress.   Neck: No JVD Cardiac: RRR, no murmurs, rubs, or gallops.  Respiratory: Clear to auscultation bilaterally. GI: Soft, nontender, non-distended  MS: No edema; No deformity. Neuro:  Nonfocal  Psych: Normal affect   Labs    Chemistry Recent Labs  Lab 06/26/18 0519  06/29/18 0518 06/30/18 0320 07/02/18 0313  NA 138   < > 143 138 139  K 4.3   < > 3.3* 3.6 3.4*  CL 103   < > 109 102 103  CO2 23   < > 21* 24 23  GLUCOSE 131*   < > 177* 234* 130*  BUN 17   < > 49* 34* 29*  CREATININE 2.86*   < > 7.44* 4.23* 4.95*  CALCIUM 7.8*   < > 7.6* 7.4* 7.8*  ALBUMIN 2.8*  --   --  2.1* 2.3*  GFRNONAA 21*   < > 7* 13* 11*  GFRAA 25*   < > 8* 15* 13*  ANIONGAP 12   < > $R'13 12 13   'qi$ < > =  values in this interval not displayed.     Hematology Recent Labs  Lab 06/30/18 0320 07/01/18 0227 07/02/18 0313  WBC 5.9 5.8 4.7  RBC 2.85* 3.15* 3.10*  HGB 8.3* 9.3* 9.1*  HCT 27.4* 30.1* 28.8*  MCV 96.1 95.6 92.9  MCH 29.1 29.5 29.4  MCHC 30.3 30.9 31.6  RDW 14.7 14.6 14.4  PLT 119* 133* 179    Cardiac Enzymes Recent Labs  Lab 06/26/18 2153 06/27/18 0437 06/27/18 1012  TROPONINI 2.05* 2.65* 2.75*   No results for input(s): TROPIPOC in the last 168 hours.   BNPNo results for input(s): BNP, PROBNP in the last 168 hours.   DDimer No results for input(s): DDIMER in the last 168 hours.   Radiology    Dg Swallowing Func-speech Pathology  Result Date: 07/01/2018 Objective Swallowing Evaluation: Type of Study: MBS-Modified Barium Swallow Study  Patient Details Name: [name] MRN: [id number] Date of Birth: October 07, 1950 Today's Date: 07/01/2018 Time: SLP Start Time (ACUTE ONLY): 1315 -SLP Stop Time (ACUTE ONLY): 1330 SLP Time Calculation (min) (ACUTE ONLY): 15 min Past Medical History: Past Medical History: Diagnosis Date . Anemia  . Arthritis   per pt (11/21/2017) .  CHF (congestive heart failure) (HCC) ~ 2017 . CKD stage 3 due to type 2 diabetes mellitus (HCC)   stage 5 Dialysis T/Th/Sa . CVA (cerebral vascular accident) [facility]) 2007  wife denies residual on 11/21/2017 . Diabetic peripheral neuropathy (HCC)  . Dyspnea  . ESRD (end stage renal disease) on dialysis (HCC)   "[place]; TTS" (11/21/2017) . GERD (gastroesophageal reflux disease)   has in the past . Headache   since going on dialysis . Hypertension  . Hypothyroidism  . PONV (postoperative nausea and vomiting)  . Restless legs  . Type II diabetes mellitus (HCC)  Past Surgical History: Past Surgical History: Procedure Laterality Date . AV FISTULA PLACEMENT Right 10/07/2016  Procedure: RIGHT BRACHIOCEPHALIC ARTERIOVENOUS (AV) FISTULA CREATION;  Surgeon: Chuck Hint, MD;  Location: [facility] OR;  Service: Vascular;  Laterality: Right; . BACK SURGERY   . CATARACT EXTRACTION W/ INTRAOCULAR LENS IMPLANT Left  . CIRCUMCISION  2015 . HERNIA REPAIR  2016  "stomach" . LEFT HEART CATH AND CORONARY ANGIOGRAPHY N/A 06/27/2018  Procedure: LEFT HEART CATH AND CORONARY ANGIOGRAPHY;  Surgeon: Lennette Bihari, MD;  Location: [facility] INVASIVE CV LAB;  Service: Cardiovascular;  Laterality: N/A; . POSTERIOR LUMBAR FUSION  1990s?  "has bolts and rods in there" HPI: 68 year old male with history of esrd on hd, cva, htn, chf, presented to [place] with ams.  He had fever of 101.6 at [place] and only responsive to deep stimuli.  Cxr showed diffuse infiltrates. Inutbated from 3/28 until 4/4. VT arrest requiring amiodarone and chest compressions on 06/27/2018, cath nonobstructive disease.  Subjective: alert, upright in chair; requires cueing Assessment / Plan / Recommendation CHL IP CLINICAL IMPRESSIONS 07/01/2018 Clinical Impression Pt presents with a mild oropharyngeal dysphagia. Oral deficitc c/b delayed AP transit, lingual pumping, reduced bolus cohesion, and mild oral residuals. Pt unable to propel 13 mm barium tablet to hypopharynx and eventually  expecorated from oral cavity. Pharyngeal deficits c/b one instance of mistimed epiglottic inversion with reduced laryngeal vestibule closure resulting in penetration of thin liquids before the swallow (on initial thin series). No aspiration evidenced, no further penetration exhibited. Recommend dysphagia 2 (fine chopped) and thin liquids with medicines as tolerated and safe swallow strategies including full supervision with all PO. SLP to follow up.  SLP Visit Diagnosis Dysphagia, oropharyngeal  phase (R13.12) Attention and concentration deficit following -- Frontal lobe and executive function deficit following -- Impact on safety and function Mild aspiration risk   CHL IP TREATMENT RECOMMENDATION 07/01/2018 Treatment Recommendations Therapy as outlined in treatment plan below   Prognosis 07/01/2018 Prognosis for Safe Diet Advancement Good Barriers to Reach Goals Cognitive deficits Barriers/Prognosis Comment -- CHL IP DIET RECOMMENDATION 07/01/2018 SLP Diet Recommendations Dysphagia 2 (Fine chop) solids;Thin liquid Liquid Administration via Cup;Straw Medication Administration Whole meds with puree Compensations Minimize environmental distractions;Slow rate;Small sips/bites Postural Changes Seated upright at 90 degrees;Remain semi-upright after after feeds/meals (Comment)   CHL IP OTHER RECOMMENDATIONS 07/01/2018 Recommended Consults -- Oral Care Recommendations Oral care BID Other Recommendations --   CHL IP FOLLOW UP RECOMMENDATIONS 07/01/2018 Follow up Recommendations 24 hour supervision/assistance;Skilled Nursing facility   [facility] IP FREQUENCY AND DURATION 07/01/2018 Speech Therapy Frequency (ACUTE ONLY) min 2x/week Treatment Duration 1 week      CHL IP ORAL PHASE 07/01/2018 Oral Phase Impaired Oral - Pudding Teaspoon -- Oral - Pudding Cup -- Oral - Honey Teaspoon -- Oral - Honey Cup -- Oral - Nectar Teaspoon -- Oral - Nectar Cup -- Oral - Nectar Straw -- Oral - Thin Teaspoon -- Oral - Thin Cup -- Oral - Thin Straw -- Oral - Puree  -- Oral - Mech Soft -- Oral - Regular -- Oral - Multi-Consistency -- Oral - Pill -- Oral Phase - Comment --  CHL IP PHARYNGEAL PHASE 07/01/2018 Pharyngeal Phase Impaired Pharyngeal- Pudding Teaspoon -- Pharyngeal -- Pharyngeal- Pudding Cup -- Pharyngeal -- Pharyngeal- Honey Teaspoon -- Pharyngeal -- Pharyngeal- Honey Cup -- Pharyngeal -- Pharyngeal- Nectar Teaspoon -- Pharyngeal -- Pharyngeal- Nectar Cup Delayed swallow initiation-pyriform sinuses Pharyngeal -- Pharyngeal- Nectar Straw -- Pharyngeal -- Pharyngeal- Thin Teaspoon -- Pharyngeal -- Pharyngeal- Thin Cup Reduced epiglottic inversion;Reduced airway/laryngeal closure;Penetration/Aspiration before swallow Pharyngeal Material enters airway, remains ABOVE vocal cords and not ejected out Pharyngeal- Thin Straw Delayed swallow initiation-pyriform sinuses Pharyngeal Material does not enter airway Pharyngeal- Puree Reduced tongue base retraction;Pharyngeal residue - valleculae Pharyngeal Material does not enter airway Pharyngeal- Mechanical Soft -- Pharyngeal -- Pharyngeal- Regular Delayed swallow initiation-vallecula;Pharyngeal residue - valleculae;Reduced tongue base retraction Pharyngeal Material does not enter airway Pharyngeal- Multi-consistency -- Pharyngeal -- Pharyngeal- Pill Other (Comment) Pharyngeal -- Pharyngeal Comment --  CHL IP CERVICAL ESOPHAGEAL PHASE 07/01/2018 Cervical Esophageal Phase WFL Pudding Teaspoon -- Pudding Cup -- Honey Teaspoon -- Honey Cup -- Nectar Teaspoon -- Nectar Cup -- Nectar Straw -- Thin Teaspoon -- Thin Cup -- Thin Straw -- Puree -- Mechanical Soft -- Regular -- Multi-consistency -- Pill -- Cervical Esophageal Comment -- Chelsea E Hartness MA, CCC-SLP 07/01/2018, 1:45 PM               Cardiac Studies   Cath: 06/27/18   Prox RCA lesion is 30% stenosed.  Mid RCA-1 lesion is 20% stenosed.  Mid RCA-2 lesion is 30% stenosed.  Ost LM to Mid LM lesion is 5% stenosed.  Mid LAD lesion is 10% stenosed.  There is moderate  left ventricular systolic dysfunction.   Moderate LV dysfunction with an EF estimated 40% with diffuse hypocontractility.  LVEDP 13 mm.  Mild nonobstructive CAD with smooth 5% narrowing in a long large left main coronary artery which trifurcated into a large LAD, ramus intermediate, and left circumflex vessel.  There was mild 10% smooth narrowing in the distal LAD.    Large dominant RCA with 3020 and 30% proximal to mid and mid distal narrowings.  RECOMMENDATION: Medical  therapy for nonischemic cardiomyopathy.  Present the patient continues to be on epinephrine, Neo-Synephrine, amiodarone.  Plan dialysis as scheduled tomorrow.    ECHO IMPRESSIONS   1. The left ventricle has normal systolic function, with an ejection fraction of 60-65%. The cavity size was normal. There is mildly increased left ventricular wall thickness. No evidence of left ventricular regional wall motion abnormalities.  2. The tricuspid valve was grossly normal. Tricuspid valve regurgitation is mild-moderate.  3. The aortic valve is tricuspid.  Patient Profile     68 y.o. male admitted with fever and sob, subsequently develoed PMVT in the setting of long QT in the setting of QT prolonging drugs.   Assessment & Plan   1. PMVT: resolved. Remains on BB therapy   2. Prolonged QT: would continue to avoid QT prolonging medications as he likely has underlying long QT with minimal penetrance which came out in the setting of amio and azithromycin. Remains on metoprolol $RemoveBefor'25mg'YeFnVrJcbLif$  BID with stable blood pressures. Keep K+ and Mg supplemented. -- repeat EKG this morning  3. Respiratory failure 2/2 CAP: extubated on 4/4, CIR pending. -- Covid neg  4. ESRD on HD: nephrology following   For questions or updates, please contact [place] Please consult [email or web address] for contact info under     Signed, Reino Bellis, NP  07/02/2018, 9:40 AM     Patient seen and examined. Agree with assessment and plan. No chest pain.   ECG today continues to show marked prolongation of QTc interval at 6 9 ms, but improved from previously.  There continues to be diffuse T wave inversion anterolaterally which also is improved.  On telemetry, heart rate now is in the mid 80s.  BP is elevated.  We will further titrate metoprolol to 50 mg every 12 hours as blood pressure and heart rate allow.  These him today 2.0, calcium is still low at 3.4.  Aim for potassium  4.0.  End-stage renal disease on dialysis followed by nephrology.  We will follow-up ECG in a.m.  Troy Sine, MD, [facility] 07/02/2018 10:38 AM

## 2018-07-02 NOTE — Progress Notes (Signed)
RN called [facility] pertaining to patient's elevated blood pressures and hydralazine $RemoveBeforeD'10mg'[id number]$  IV push given per PRN order at 0328 (ordered q3h).  Post hydralazine blood pressure 173/60 at 0449.  Blood pressure taken at 0522 was 184/62.

## 2018-07-02 NOTE — Progress Notes (Signed)
Physical Therapy Treatment Patient Details Name: Alexander Whitaker MRN: [id number] DOB: [birth year] Today's Date: 07/02/2018    History of Present Illness 68 year old male with history of esrd on hd, cva, htn, chf, presented to [place] with ams.  He had fever of 101.6 at [place] and only responsive to deep stimuli.  Cxr showed diffuse infiltrates. Inutbated from 3/28 until 4/4. VT arrest requiring amiodarone and chest compressions on 06/27/2018, cath nonobstructive disease.     PT Comments    Pt reports he threw up earlier today after being given a medication. Was able to move from supine to sit (with HOB elevated) without assist but closely monitored. PT cues to keep pt from attempting to stand independently but when asked, denied wanting to stand or move to chair. BP to 188/85 while seated and reported dizziness. Was able to move from sit to supine independently but 2 PTs required for shifting pt toward HPB. BP 185/67 in supine position.   Follow Up Recommendations  CIR     Equipment Recommendations  Rolling walker with 5" wheels;3in1 (PT)    Recommendations for Other Services       Precautions / Restrictions Precautions Precautions: Fall Restrictions Weight Bearing Restrictions: No    Mobility  Bed Mobility Overal bed mobility: Needs Assistance Bed Mobility: Supine to Sit     Supine to sit: Min assist     General bed mobility comments: elevated HOB  Transfers                    Ambulation/Gait                 Stairs             Wheelchair Mobility    Modified Rankin (Stroke Patients Only)       Balance Overall balance assessment: Needs assistance Sitting-balance support: Bilateral upper extremity supported Sitting balance-Leahy Scale: Fair       Standing balance-Leahy Scale: (not performed)                              Cognition Arousal/Alertness: Awake/alert Behavior During Therapy: WFL for tasks  assessed/performed;Impulsive Overall Cognitive Status: No family/caregiver present to determine baseline cognitive functioning                                        Exercises      General Comments        Pertinent Vitals/Pain Pain Assessment: No/denies pain    Home Living                      Prior Function            PT Goals (current goals can now be found in the care plan section)      Frequency    Min 3X/week      PT Plan      Co-evaluation              AM-PAC PT "6 Clicks" Mobility   Outcome Measure  Help needed turning from your back to your side while in a flat bed without using bedrails?: None Help needed moving from lying on your back to sitting on the side of a flat bed without using bedrails?: A Little Help needed moving to and from a bed to a  chair (including a wheelchair)?: A Lot Help needed standing up from a chair using your arms (e.g., wheelchair or bedside chair)?: A Lot Help needed to walk in hospital room?: A Lot Help needed climbing 3-5 steps with a railing? : Total 6 Click Score: 14    End of Session   Activity Tolerance: Patient tolerated treatment well Patient left: in bed;with bed alarm set;with nursing/sitter in room;with call bell/phone within reach   PT Visit Diagnosis: Unsteadiness on feet (R26.81);Other abnormalities of gait and mobility (R26.89)     Time: 1050-1104 PT Time Calculation (min) (ACUTE ONLY): 14 min  Charges:  $Therapeutic Activity: 8-22 mins                     Selinda Eon PT, DPT Acute Rehab [phone]

## 2018-07-02 NOTE — Progress Notes (Signed)
Inpatient Rehabilitation-Admissions Coordinator    Met with patient at the bedside to discuss team's recommendation for inpatient rehabilitation. Shared booklets, expectations while in CIR, expected length of stay, and anticipated functional level at DC.    Pt adamantly refusing CIR and states he wants to return home. Due to current level of assist [facility] requested to speak with pt's wife to ensure she understands the amount of assistance he would need. Spoke with wife, and she states she feels she is capable of that amount of assistance and wants the pt home.   Due to continued concern for pt safety, AC will follow up one more time with pt and his wife to ensure they are clear about current recommended level of assistance and for return home.   Please call if questions.   Jhonnie Garner, OTR/L  Rehab Admissions Coordinator  [phone] 07/02/2018 3:16 PM

## 2018-07-02 NOTE — Progress Notes (Signed)
[facility] ROUNDING NOTE   Subjective:   This is a 68 year old gentleman with a history of community-acquired pneumonia and sepsis and pulmonary infiltrates.  He required intubation.  He also had a V. tach cardiac arrest with CPR lasted about 5 minutes and IV amiodarone 06/27/2018.  He was intubated and extubated on 06/30/2018.  He received CVVHD 06/23/2020 06/26/2018.  His next dialysis session is planned for Tuesday for 07/03/2018  Blood pressure 184/62 pulse 83 temperature 98.6 O2 sats 98% room air  Dialysis 06/30/2018   1.6 L removed  Amlodipine 10 mg daily, aspirin 81 mg daily metoprolol 25 mg twice daily levothyroxine 50 mcg daily Protonix 40 mg daily Aranesp 150 mcg every 2 weeks, IV Nulicit load  Sodium 938 potassium 3.4 chloride 103 CO2 23 BUN 29 creatinine 4.95 glucose 130 calcium 7.8, phosphorus 3.9 albumin 2.3.  WBC 4.7 hemoglobin 9.1 platelets 179  Chest x-ray showed improving pulmonary edema.    Objective:  Vital signs in last 24 hours:  Temp:  [98.4 F (36.9 C)-99.2 F (37.3 C)] 98.6 F (37 C) (04/06 0317) Pulse Rate:  [75-110] 83 (04/06 0522) Resp:  [18-27] 26 (04/06 0317) BP: (157-184)/(52-108) 184/62 (04/06 0522) SpO2:  [97 %-98 %] 98 % (04/06 0317) Weight:  [72.3 kg-72.9 kg] 72.9 kg (04/06 0311)  Weight change: 2.003 kg Filed Weights   06/30/18 1130 07/01/18 1225 07/02/18 0311  Weight: 70.3 kg 72.3 kg 72.9 kg    Intake/Output: I/O last 3 completed shifts: In: 334.9 [P.O.:120; I.V.:14.9; IV Piggyback:200] Out: -    Intake/Output this shift:  No intake/output data recorded. Awake alert oriented no distress CVS- RRR RS- CTA no wheezes or rales ABD- BS present soft non-distended EXT- no edema AV fistula positive bruit  [facility]  4 hours 76.5 kg   2/2.25 bath   no heparin   right upper arm AV fistula 400/800 Micera 200 mcg every 2 weeks Venofer 50 mg weekly    Basic Metabolic Panel: Recent Labs  Lab 06/26/18 0519  06/27/18 0437   06/27/18 1102 06/28/18 0459 06/29/18 0518 06/30/18 0320 07/02/18 0313  NA 138   < > 139   < > 143 143 143 138 139  K 4.3   < > 4.1   < > 3.6 4.1 3.3* 3.6 3.4*  CL 103   < > 103  --   --  107 109 102 103  CO2 23   < > 15*  --   --  17* 21* 24 23  GLUCOSE 131*   < > 157*  --   --  151* 177* 234* 130*  BUN 17   < > 26*  --   --  34* 49* 34* 29*  CREATININE 2.86*   < > 4.26*  --   --  6.09* 7.44* 4.23* 4.95*  CALCIUM 7.8*   < > 8.1*  --   --  7.7* 7.6* 7.4* 7.8*  MG 2.3   < > 2.6*  --   --  2.5* 2.4 1.9 2.0  PHOS 3.8  --  3.9  --   --  4.7*  --  3.1 3.9   < > = values in this interval not displayed.    Liver Function Tests: Recent Labs  Lab 06/25/18 1442 06/26/18 0519 06/30/18 0320 07/02/18 0313  ALBUMIN 2.5* 2.8* 2.1* 2.3*   No results for input(s): LIPASE, AMYLASE in the last 168 hours. No results for input(s): AMMONIA in the last 168 hours.  CBC:  Recent Labs  Lab 06/28/18 0459 06/29/18 0518 06/30/18 0320 07/01/18 0227 07/02/18 0313  WBC 10.1 5.4 5.9 5.8 4.7  NEUTROABS 7.7  --   --   --   --   HGB 9.0* 8.5* 8.3* 9.3* 9.1*  HCT 30.4* 28.7* 27.4* 30.1* 28.8*  MCV 100.0 98.3 96.1 95.6 92.9  PLT 158 123* 119* 133* 179    Cardiac Enzymes: Recent Labs  Lab 06/26/18 2153 06/27/18 0437 06/27/18 1012  TROPONINI 2.05* 2.65* 2.75*    BNP: Invalid input(s): POCBNP  CBG: Recent Labs  Lab 07/01/18 2025 07/02/18 0019 07/02/18 0413 07/02/18 0523 07/02/18 0738  GLUCAP 123* 105* 120* 136* 119*    Microbiology: Results for orders placed or performed during the hospital encounter of 06/24/18  MRSA PCR Screening     Status: None   Collection Time: 06/24/18  4:12 AM  Result Value Ref Range Status   MRSA by PCR NEGATIVE NEGATIVE Final    Comment:        The GeneXpert MRSA Assay (FDA approved for NASAL specimens only), is one component of a comprehensive MRSA colonization surveillance program. It is not intended to diagnose MRSA infection nor to guide  or monitor treatment for MRSA infections. Performed at [facility], [place] [address], [place], [place] [address]   Culture, blood (routine x 2) Call MD if unable to obtain prior to antibiotics being given     Status: None   Collection Time: 06/24/18  6:39 AM  Result Value Ref Range Status   Specimen Description BLOOD A-LINE DRAW  Final   Special Requests   Final    BOTTLES DRAWN AEROBIC AND ANAEROBIC Blood Culture adequate volume   Culture   Final    NO GROWTH 5 DAYS Performed at [facility], [place] [address], [place], [place] [address]    Report Status 06/29/2018 FINAL  Final  Culture, respiratory (non-expectorated)     Status: None   Collection Time: 06/24/18  7:35 AM  Result Value Ref Range Status   Specimen Description TRACHEAL ASPIRATE  Final   Special Requests NONE  Final   Gram Stain   Final    MODERATE WBC PRESENT, PREDOMINANTLY PMN RARE BUDDING YEAST SEEN Performed at [facility], [place] [address], [place], [place] [address]    Culture ABUNDANT CANDIDA [place]  Final   Report Status 06/27/2018 FINAL  Final  Culture, blood (routine x 2)     Status: None   Collection Time: 06/24/18  8:01 AM  Result Value Ref Range Status   Specimen Description BLOOD CENTRAL LINE  Final   Special Requests   Final    BOTTLES DRAWN AEROBIC AND ANAEROBIC Blood Culture adequate volume   Culture   Final    NO GROWTH 5 DAYS Performed at [facility], [place] [address] [place],  [address]    Report Status 06/29/2018 FINAL  Final    Coagulation Studies: No results for input(s): LABPROT, INR in the last 72 hours.  Urinalysis: No results for input(s): COLORURINE, LABSPEC, PHURINE, GLUCOSEU, HGBUR, BILIRUBINUR, KETONESUR, PROTEINUR, UROBILINOGEN, NITRITE, LEUKOCYTESUR in the last 72 hours.  Invalid input(s): APPERANCEUR    Imaging: Dg Swallowing Func-speech Pathology  Result Date: 07/01/2018 Objective Swallowing Evaluation: Type of Study: MBS-Modified Barium  Swallow Study  Patient Details Name: JAHZIAH SIMONIN MRN: [id number] Date of Birth: November 10, 1950 Today's Date: 07/01/2018 Time: SLP Start Time (ACUTE ONLY): 5056 -SLP Stop Time (ACUTE ONLY): 1330 SLP Time Calculation (min) (  ACUTE ONLY): 15 min Past Medical History: Past Medical History: Diagnosis Date . Anemia  . Arthritis   per pt (11/21/2017) . CHF (congestive heart failure) ([place]) ~ 2017 . CKD stage 3 due to type 2 diabetes mellitus ([place])   stage 5 Dialysis T/Th/Sa . CVA (cerebral vascular accident) [facility]) 2007  wife denies residual on 11/21/2017 . Diabetic peripheral neuropathy ([place])  . Dyspnea  . ESRD (end stage renal disease) on dialysis ([place])   "[place]; TTS" (11/21/2017) . GERD (gastroesophageal reflux disease)   has in the past . Headache   since going on dialysis . Hypertension  . Hypothyroidism  . PONV (postoperative nausea and vomiting)  . Restless legs  . Type II diabetes mellitus ([place])  Past Surgical History: Past Surgical History: Procedure Laterality Date . AV FISTULA PLACEMENT Right 10/07/2016  Procedure: RIGHT BRACHIOCEPHALIC ARTERIOVENOUS (AV) FISTULA CREATION;  Surgeon: Angelia Mould, MD;  Location: [facility];  Service: Vascular;  Laterality: Right; . BACK SURGERY   . CATARACT EXTRACTION W/ INTRAOCULAR LENS IMPLANT Left  . CIRCUMCISION  2015 . HERNIA REPAIR  2016  "stomach" . LEFT HEART CATH AND CORONARY ANGIOGRAPHY N/A 06/27/2018  Procedure: LEFT HEART CATH AND CORONARY ANGIOGRAPHY;  Surgeon: Troy Sine, MD;  Location: [facility];  Service: Cardiovascular;  Laterality: N/A; . POSTERIOR LUMBAR FUSION  1990s?  "has bolts and rods in there" HPI: 68 year old male with history of esrd on hd, cva, htn, chf, presented to [facility] with ams.  He had fever of 101.6 at [place] and only responsive to deep stimuli.  Cxr showed diffuse infiltrates. Inutbated from 3/28 until 4/4. VT arrest requiring amiodarone and chest compressions on 06/27/2018, cath nonobstructive disease.  Subjective: alert, upright in  chair; requires cueing Assessment / Plan / Recommendation CHL IP CLINICAL IMPRESSIONS 07/01/2018 Clinical Impression Pt presents with a mild oropharyngeal dysphagia. Oral deficitc c/b delayed AP transit, lingual pumping, reduced bolus cohesion, and mild oral residuals. Pt unable to propel 13 mm barium tablet to hypopharynx and eventually expecorated from oral cavity. Pharyngeal deficits c/b one instance of mistimed epiglottic inversion with reduced laryngeal vestibule closure resulting in penetration of thin liquids before the swallow (on initial thin series). No aspiration evidenced, no further penetration exhibited. Recommend dysphagia 2 (fine chopped) and thin liquids with medicines as tolerated and safe swallow strategies including full supervision with all PO. SLP to follow up.  SLP Visit Diagnosis Dysphagia, oropharyngeal phase (R13.12) Attention and concentration deficit following -- Frontal lobe and executive function deficit following -- Impact on safety and function Mild aspiration risk   CHL IP TREATMENT RECOMMENDATION 07/01/2018 Treatment Recommendations Therapy as outlined in treatment plan below   Prognosis 07/01/2018 Prognosis for Safe Diet Advancement Good Barriers to Reach Goals Cognitive deficits Barriers/Prognosis Comment -- CHL IP DIET RECOMMENDATION 07/01/2018 SLP Diet Recommendations Dysphagia 2 (Fine chop) solids;Thin liquid Liquid Administration via Cup;Straw Medication Administration Whole meds with puree Compensations Minimize environmental distractions;Slow rate;Small sips/bites Postural Changes Seated upright at 90 degrees;Remain semi-upright after after feeds/meals (Comment)   CHL IP OTHER RECOMMENDATIONS 07/01/2018 Recommended Consults -- Oral Care Recommendations Oral care BID Other Recommendations --   CHL IP FOLLOW UP RECOMMENDATIONS 07/01/2018 Follow up Recommendations 24 hour supervision/assistance;Skilled Nursing facility   [facility] IP FREQUENCY AND DURATION 07/01/2018 Speech Therapy Frequency  (ACUTE ONLY) min 2x/week Treatment Duration 1 week      CHL IP ORAL PHASE 07/01/2018 Oral Phase Impaired Oral - Pudding Teaspoon -- Oral - Pudding Cup -- Oral - Honey  Teaspoon -- Oral - Honey Cup -- Oral - Nectar Teaspoon -- Oral - Nectar Cup -- Oral - Nectar Straw -- Oral - Thin Teaspoon -- Oral - Thin Cup -- Oral - Thin Straw -- Oral - Puree -- Oral - Mech Soft -- Oral - Regular -- Oral - Multi-Consistency -- Oral - Pill -- Oral Phase - Comment --  CHL IP PHARYNGEAL PHASE 07/01/2018 Pharyngeal Phase Impaired Pharyngeal- Pudding Teaspoon -- Pharyngeal -- Pharyngeal- Pudding Cup -- Pharyngeal -- Pharyngeal- Honey Teaspoon -- Pharyngeal -- Pharyngeal- Honey Cup -- Pharyngeal -- Pharyngeal- Nectar Teaspoon -- Pharyngeal -- Pharyngeal- Nectar Cup Delayed swallow initiation-pyriform sinuses Pharyngeal -- Pharyngeal- Nectar Straw -- Pharyngeal -- Pharyngeal- Thin Teaspoon -- Pharyngeal -- Pharyngeal- Thin Cup Reduced epiglottic inversion;Reduced airway/laryngeal closure;Penetration/Aspiration before swallow Pharyngeal Material enters airway, remains ABOVE vocal cords and not ejected out Pharyngeal- Thin Straw Delayed swallow initiation-pyriform sinuses Pharyngeal Material does not enter airway Pharyngeal- Puree Reduced tongue base retraction;Pharyngeal residue - valleculae Pharyngeal Material does not enter airway Pharyngeal- Mechanical Soft -- Pharyngeal -- Pharyngeal- Regular Delayed swallow initiation-vallecula;Pharyngeal residue - valleculae;Reduced tongue base retraction Pharyngeal Material does not enter airway Pharyngeal- Multi-consistency -- Pharyngeal -- Pharyngeal- Pill Other (Comment) Pharyngeal -- Pharyngeal Comment --  CHL IP CERVICAL ESOPHAGEAL PHASE 07/01/2018 Cervical Esophageal Phase WFL Pudding Teaspoon -- Pudding Cup -- Honey Teaspoon -- Honey Cup -- Nectar Teaspoon -- Nectar Cup -- Nectar Straw -- Thin Teaspoon -- Thin Cup -- Thin Straw -- Puree -- Mechanical Soft -- Regular -- Multi-consistency -- Pill --  Cervical Esophageal Comment -- Chelsea E Hartness MA, CCC-SLP 07/01/2018, 1:45 PM                Medications:   . sodium chloride Stopped (07/01/18 1007)  . [START ON 07/03/2018] ferric gluconate (FERRLECIT/NULECIT) IV    . sodium chloride 250 mL (06/28/18 1511)   . amLODipine  10 mg Oral Daily  . aspirin  81 mg Per Tube Daily  . chlorhexidine  15 mL Mouth Rinse BID  . Chlorhexidine Gluconate Cloth  6 each Topical Q0600  . [START ON 07/03/2018] darbepoetin (ARANESP) injection - NON-DIALYSIS  150 mcg Subcutaneous Q Tue-HD  . heparin  5,000 Units Subcutaneous Q8H  . insulin aspart  0-24 Units Subcutaneous Q4H  . levothyroxine  50 mcg Per Tube Q0600  . mouth rinse  15 mL Mouth Rinse q12n4p  . metoprolol tartrate  25 mg Oral BID  . pantoprazole sodium  40 mg Per Tube Q24H  . sodium chloride flush  3 mL Intravenous Q12H   sodium chloride, acetaminophen, hydrALAZINE, ondansetron (ZOFRAN) IV, sodium chloride, sodium chloride flush  Assessment/ Plan:   Community acquired pneumonia.  Intubated now extubated.  COVID-19 test negative.  Receiving IV antibiotics.  It appears her last dose was 06/30/2018 of Rocephin..  Azithromycin administered 06/23/2020 -  06/28/2018 chest x-ray improved.  But decreasing oxygen requirements  VT cardiac arrest transition to IV amiodarone  Ventilator dependent respiratory failure extubated 06/30/2018  Hypertension/volume appears to be doing well will continue to challenge estimated dry weight encourage compliance with dialysis  Anemia iron low T sats 10% started IV iron loading and darbepoetin 06/30/2018  Bones.  No binders at this present time  Nutrition stable at this present time we will continue to follow consider adding Protostat and vitamins  Diabetes mellitus sliding scale  Hypothyroidism on replacement therapy  Congestive heart failure with diastolic dysfunction 2D echo performed 06/28/2018   LOS: [place] $RemoveBef'@TODAY'ZImOitwCGr$ $Remo'@8'HChRx$ :24 AM

## 2018-07-02 NOTE — Progress Notes (Signed)
Spoke with Dr. Justin Mend regarding patient pulling out HD cath, per MD should be okay to use R arm fistula for dialysis on 4/7.

## 2018-07-02 NOTE — Evaluation (Signed)
Occupational Therapy Evaluation Patient Details Name: Alexander Whitaker MRN: [id number] DOB: 09-Sep-1950 Today's Date: 07/02/2018    History of Present Illness 68 year old male with history of esrd on hd, cva, htn, chf, presented to [facility] with ams.  He had fever of 101.6 at [employer] and only responsive to deep stimuli.  Cxr showed diffuse infiltrates. Inutbated from 3/28 until 4/4. VT arrest requiring amiodarone and chest compressions on 06/27/2018, cath nonobstructive disease.    Clinical Impression   Pt reports being independent prior to admission and drives. He presents with impaired cognition, decreased activity tolerance, generalized weakness and poor standing balance. He required 2 person assist for OOB mobility this visit. Pt is able to self feed and perform simple grooming with set up, he needs min to total assist for bathing, dressing and toileting. Pt will need intensive rehab to prepare him to return home with his wife. Will follow acutely.    Follow Up Recommendations  CIR;Supervision/Assistance - 24 hour    Equipment Recommendations  Other (comment)(defer to next venue)    Recommendations for Other Services       Precautions / Restrictions Precautions Precautions: Fall Restrictions Weight Bearing Restrictions: No      Mobility Bed Mobility Overal bed mobility: Needs Assistance Bed Mobility: Supine to Sit;Sit to Supine     Supine to sit: Min assist Sit to supine: Mod assist   General bed mobility comments: elevated HOB, pulled up on therapist's hand, assisted LEs back into bed  Transfers Overall transfer level: Needs assistance Equipment used: 2 person hand held assist Transfers: Sit to/from [employer] Transfers Sit to Stand: +2 physical assistance;Mod assist Stand pivot transfers: +2 physical assistance;Mod assist       General transfer comment: assist to rise and steady    Balance Overall balance assessment: Needs assistance Sitting-balance  support: Bilateral upper extremity supported Sitting balance-Leahy Scale: Fair Sitting balance - Comments: without UE support while participating in grooming and self feeding     Standing balance-Leahy Scale: Poor                             ADL either performed or assessed with clinical judgement   ADL Overall ADL's : Needs assistance/impaired Eating/Feeding: Set up;Sitting;Supervision/ safety   Grooming: Wash/dry hands;Wash/dry face;Sitting;Supervision/safety Grooming Details (indicate cue type and reason): verbal cues for thoroughness Upper Body Bathing: Minimal assistance;Sitting   Lower Body Bathing: Maximal assistance;Sit to/from stand   Upper Body Dressing : Minimal assistance;Sitting   Lower Body Dressing: Maximal assistance;Sit to/from stand   Toilet Transfer: +2 for physical assistance;Moderate assistance;Stand-pivot;BSC   Toileting- Clothing Manipulation and Hygiene: Total assistance;+2 for physical assistance;Sit to/from stand         General ADL Comments: sat at EOB to eat magic cup and performed grooming     Vision Patient Visual Report: No change from baseline       Perception     Praxis      Pertinent Vitals/Pain Pain Assessment: Faces Faces Pain Scale: No hurt Pain Intervention(s): Monitored during session     Hand Dominance Right   Extremity/Trunk Assessment Upper Extremity Assessment Upper Extremity Assessment: Generalized weakness   Lower Extremity Assessment Lower Extremity Assessment: Defer to PT evaluation       Communication Communication Communication: HOH   Cognition Arousal/Alertness: Awake/alert Behavior During Therapy: WFL for tasks assessed/performed;Impulsive Overall Cognitive Status: No family/caregiver present to determine baseline cognitive functioning Area of Impairment: Orientation;Memory;Following commands;Safety/judgement;Awareness;Problem solving  Orientation Level: Disoriented  to;Time;Situation   Memory: Decreased short-term memory Following Commands: Follows one step commands with increased time Safety/Judgement: Decreased awareness of safety;Decreased awareness of deficits Awareness: Intellectual Problem Solving: Decreased initiation;Slow processing;Requires verbal cues General Comments: pt with mitt on R hand, pulls at lines   General Comments       Exercises     Shoulder Instructions      Home Living Family/patient expects to be discharged to:: Private residence Living Arrangements: Spouse/significant other Available Help at Discharge: Family Type of Home: Mobile home Home Access: Stairs to enter Entrance Stairs-Number of Steps: 6 Entrance Stairs-Rails: Right;Left Home Layout: One level     Bathroom Shower/Tub: Walk-in shower;Tub/shower unit   [employer]: Standard     Home Equipment: Shower seat - built in;Grab bars - tub/shower          Prior Functioning/Environment Level of Independence: Independent        Comments: drives, reports no hobbies        OT Problem List: Decreased strength;Decreased activity tolerance;Impaired balance (sitting and/or standing);Decreased cognition;Decreased safety awareness;Decreased knowledge of use of DME or AE      OT Treatment/Interventions: Self-care/ADL training;DME and/or AE instruction;Cognitive remediation/compensation;Patient/family education;Balance training    OT Goals(Current goals can be found in the care plan section) Acute Rehab OT Goals Patient Stated Goal: go home with [name] OT Goal Formulation: With patient Time For Goal Achievement: 07/16/18 Potential to Achieve Goals: Good ADL Goals Pt Will Perform Grooming: with min guard assist;standing Pt Will Perform Upper Body Dressing: with set-up;with supervision;sitting Pt Will Perform Lower Body Dressing: with min assist;sit to/from stand Pt Will Transfer to Toilet: with min guard assist;ambulating;bedside commode Pt  Will Perform Toileting - Clothing Manipulation and hygiene: with min assist;sit to/from stand Additional ADL Goal #1: Pt will perform bed mobility with supervision in preparation for ADL.  OT Frequency: Min 2X/week   Barriers to D/C:            Co-evaluation              AM-PAC OT "6 Clicks" Daily Activity     Outcome Measure Help from another person eating meals?: None Help from another person taking care of personal grooming?: A Little Help from another person toileting, which includes using toliet, bedpan, or urinal?: A Lot Help from another person bathing (including washing, rinsing, drying)?: A Lot Help from another person to put on and taking off regular upper body clothing?: A Little Help from another person to put on and taking off regular lower body clothing?: Total 6 Click Score: 15   End of Session Equipment Utilized During Treatment: Gait belt  Activity Tolerance: Patient tolerated treatment well Patient left: in bed;with call bell/phone within reach;with bed alarm set;with restraints reapplied(R mitt)  OT Visit Diagnosis: Unsteadiness on feet (R26.81);Muscle weakness (generalized) (M62.81);Other symptoms and signs involving cognitive function                Time: 1400-1429 OT Time Calculation (min): 29 min Charges:  OT General Charges $OT Visit: 1 Visit OT Evaluation $OT Eval Moderate Complexity: 1 Mod OT Treatments $Self Care/Home Management : 8-22 mins  Nestor Lewandowsky, OTR/L Acute Rehabilitation Services Pager: [phone] Office: [phone]  Malka So 07/02/2018, 2:47 PM

## 2018-07-02 NOTE — Consult Note (Signed)
Physical Medicine and Rehabilitation Consult Reason for Consult: Decreased functional mobility Referring Physician: [facility]   HPI: Alexander Whitaker is a 68 y.o.right handed male with history of ESRD with hemodialysis, CVA, hypertension, diastolic congestive heart failure, diabetes mellitus, quit smoking 15 months ago. Per chart review, patient lives with spouse. Mobile home 6 steps to entry. Reportedly independent prior to admission and driving. Presented 06/24/2018 to [facility] with AMS, fever 101.6. He did require intubation. Chest x-ray showed diffuse infiltrates as well as blood sugar 638 placed on insulin drip. He was given broad-spectrum antibiotics and transferred to [facility] for suspected septic shock. Blood cultures no growth to date, WBC 23,400, creatinine 5.94, lactic acid 1.9.  He had V. Tach arrest 5 minutes which required CPR and defibrillated on 06/27/2018 cardiology follow-up. Troponin 2.05-2.75. cardiac catheterization completed showing mild nonobstructive CAD, moderate LV dysfunction with ejection fraction of 40%. Medical therapy recommended for nonischemic cardiomyopathy and maintained on aspirin therapy. Subcutaneous heparin for DVT prophylaxis. Patient was extubated 06/30/2018.  Hospital course further complicated by dysphagia #2 thin liquid diet. Therapy evaluations completed with recommendations of physical medicine rehabilitation consult.   Review of Systems  HENT: Negative for hearing loss.   Eyes: Negative for blurred vision and double vision.  Respiratory: Positive for wheezing.   Cardiovascular: Negative for chest pain and palpitations.  Gastrointestinal: Positive for constipation. Negative for heartburn, nausea and vomiting.       GERD  Genitourinary: Negative for flank pain and hematuria.  Skin: Negative for rash.  Neurological: Positive for weakness and headaches.  All other systems reviewed and are negative.  Past Medical History:  Diagnosis  Date  . Anemia   . Arthritis    per pt (11/21/2017)  . CHF (congestive heart failure) ([place]) ~ 2017  . CKD stage 3 due to type 2 diabetes mellitus ([place])    stage 5 Dialysis T/Th/Sa  . CVA (cerebral vascular accident) [facility]) 2007   wife denies residual on 11/21/2017  . Diabetic peripheral neuropathy ([place])   . Dyspnea   . ESRD (end stage renal disease) on dialysis ([place])    "[place]; TTS" (11/21/2017)  . GERD (gastroesophageal reflux disease)    has in the past  . Headache    since going on dialysis  . Hypertension   . Hypothyroidism   . PONV (postoperative nausea and vomiting)   . Restless legs   . Type II diabetes mellitus ([place])    Past Surgical History:  Procedure Laterality Date  . AV FISTULA PLACEMENT Right 10/07/2016   Procedure: RIGHT BRACHIOCEPHALIC ARTERIOVENOUS (AV) FISTULA CREATION;  Surgeon: Angelia Mould, MD;  Location: [place] [facility];  Service: Vascular;  Laterality: Right;  . BACK SURGERY    . CATARACT EXTRACTION W/ INTRAOCULAR LENS IMPLANT Left   . CIRCUMCISION  2015  . HERNIA REPAIR  2016   "stomach"  . LEFT HEART CATH AND CORONARY ANGIOGRAPHY N/A 06/27/2018   Procedure: LEFT HEART CATH AND CORONARY ANGIOGRAPHY;  Surgeon: Troy Sine, MD;  Location: [facility];  Service: Cardiovascular;  Laterality: N/A;  . POSTERIOR LUMBAR FUSION  1990s?   "has bolts and rods in there"   Family History  Problem Relation Age of Onset  . Leukemia Mother 68  . CVA Father 75   Social History:  reports that he quit smoking about 15 months ago. His smoking use included cigarettes. He has a 57.00 pack-year smoking history. He has never used smokeless tobacco. He  reports previous alcohol use. He reports that he does not use drugs. Allergies: No Known Allergies Medications Prior to Admission  Medication Sig Dispense Refill  . albuterol (PROVENTIL HFA;VENTOLIN HFA) 108 (90 Base) MCG/ACT inhaler Inhale 2 puffs into the lungs every 6 (six) hours as needed for wheezing or  shortness of breath.    [name] amLODipine (NORVASC) 10 MG tablet Take 1 tablet (10 mg total) by mouth daily. 30 tablet 0  . camphor-menthol (SARNA) lotion Apply 1 application topically as needed for itching.    . clonazePAM (KLONOPIN) 2 MG tablet Take 2 mg by mouth 3 (three) times daily as needed for anxiety.    . diclofenac sodium (VOLTAREN) 1 % GEL Apply 2 g topically 3 (three) times daily as needed for pain. knees    . escitalopram (LEXAPRO) 10 MG tablet Take 10 mg by mouth daily.    . famotidine (PEPCID) 40 MG tablet Take 40 mg by mouth daily.    [name] gabapentin (NEURONTIN) 300 MG capsule Take 300 mg by mouth 2 (two) times daily.    . hydrOXYzine (ATARAX/VISTARIL) 25 MG tablet Take 25 mg by mouth 2 (two) times daily as needed for itching.     . Insulin Detemir (LEVEMIR) 100 UNIT/ML Pen Inject 10 Units into the skin daily at 12 noon. (Patient taking differently: Inject 50 Units into the skin daily at 12 noon. )    . levothyroxine (SYNTHROID, LEVOTHROID) 50 MCG tablet Take 50 mcg by mouth daily before breakfast.     . lidocaine-prilocaine (EMLA) cream Apply 1 application topically See admin instructions. Apply small amount to access site 1 to 2 hours before dialysis.  12  . loratadine (CLARITIN) 10 MG tablet Take 10 mg by mouth daily as needed for rhinitis.     . Melatonin 10 MG CAPS Take 10 mg by mouth at bedtime as needed (sleep).     . ondansetron (ZOFRAN) 4 MG tablet Take 4 mg by mouth 4 (four) times daily as needed for nausea/vomiting.    . pantoprazole (PROTONIX) 40 MG tablet Take 40 mg by mouth daily.    . pramipexole (MIRAPEX) 0.5 MG tablet Take 0.5 mg by mouth at bedtime as needed (sleep).      Home: Home Living Family/patient expects to be discharged to:: Private residence Living Arrangements: Spouse/significant other Available Help at Discharge: Family Type of Home: Mobile home Home Access: Stairs to enter Technical brewer of Steps: 6 Entrance Stairs-Rails: Right, Left Home  Layout: One level Bathroom Shower/Tub: Gaffer, Chiropodist: Standard Home Equipment: Civil engineer, contracting - built in, [employer] - tub/shower  Functional History: Prior Function Level of Independence: Independent Comments: drives, reports no hobbies Functional Status:  Mobility: Bed Mobility Overal bed mobility: Needs Assistance Bed Mobility: Supine to Sit Supine to sit: Mod assist, +2 for physical assistance General bed mobility comments: Heavy mod assist and use of bed pad to move hips to EOB and elevate trunk to sit Transfers Overall transfer level: Needs assistance Equipment used: Rolling walker (2 wheeled) Transfers: Sit to/from Stand Sit to Stand: +2 physical assistance, +2 safety/equipment, Mod assist General transfer comment: Heavy mod assist to power up to stand; Difficulty processing hand placement/RW use, so opted to proceend with 2 person handheld assist Ambulation/Gait Ambulation/Gait assistance: +2 physical assistance, Mod assist Gait Distance (Feet): 5 Feet Assistive device: 2 person hand held assist Gait Pattern/deviations: Shuffle General Gait Details: Difficulty with weight shifting completely into single limb stance and unwighing swing LE for  stepping; faciliation of weght shift; heavy R lean in standing; LOTS of difficulty with stepping backwards to reclienr    ADL:    Cognition: Cognition Overall Cognitive Status: No family/caregiver present to determine baseline cognitive functioning Orientation Level: Oriented to place, Oriented to person, Disoriented to time, Disoriented to situation Cognition Arousal/Alertness: Awake/alert Behavior During Therapy: WFL for tasks assessed/performed, Impulsive Overall Cognitive Status: No family/caregiver present to determine baseline cognitive functioning  Blood pressure (!) 184/62, pulse 83, temperature 98.6 F (37 C), temperature source Oral, resp. rate (!) 26, height 5\' 5"  (1.651 m), weight 72.9  kg, SpO2 98 %. Physical Exam  Constitutional: He appears well-developed and well-nourished.  HENT:  Head: Normocephalic and atraumatic.  Eyes: EOM are normal. Right eye exhibits no discharge. Left eye exhibits no discharge.  Respiratory: Effort normal.  GI: He exhibits no distension.  Musculoskeletal:     Comments: No edema or tenderness in lower extremities  Neurological: He is alert.  Patient is alert with mittens on right hand He did follow simple commands.  Made good eye contact with examiner.   Oriented x1  Motor: Bilateral lower extremities: Grossly hip flexion, knee extension 4-/5, ankle dorsiflexion 4/5  Skin: Skin is warm and dry.  Psychiatric: His speech is delayed. Cognition and memory are impaired.    Results for orders placed or performed during the hospital encounter of 06/24/18 (from the past 24 hour(s))  Glucose, capillary     Status: Abnormal   Collection Time: 07/01/18  7:37 AM  Result Value Ref Range   Glucose-Capillary 148 (H) 70 - 99 mg/dL  Glucose, capillary     Status: Abnormal   Collection Time: 07/01/18 11:25 AM  Result Value Ref Range   Glucose-Capillary 147 (H) 70 - 99 mg/dL  Glucose, capillary     Status: Abnormal   Collection Time: 07/01/18  6:28 PM  Result Value Ref Range   Glucose-Capillary 148 (H) 70 - 99 mg/dL  Glucose, capillary     Status: Abnormal   Collection Time: 07/01/18  8:25 PM  Result Value Ref Range   Glucose-Capillary 123 (H) 70 - 99 mg/dL  Glucose, capillary     Status: Abnormal   Collection Time: 07/02/18 12:19 AM  Result Value Ref Range   Glucose-Capillary 105 (H) 70 - 99 mg/dL  CBC     Status: Abnormal   Collection Time: 07/02/18  3:13 AM  Result Value Ref Range   WBC 4.7 4.0 - 10.5 K/uL   RBC 3.10 (L) 4.22 - 5.81 MIL/uL   Hemoglobin 9.1 (L) 13.0 - 17.0 g/dL   HCT 09/01/18 (L) 25.2 - 26.1 %   MCV 92.9 80.0 - 100.0 fL   MCH 29.4 26.0 - 34.0 pg   MCHC 31.6 30.0 - 36.0 g/dL   RDW 67.4 62.8 - 28.6 %   Platelets 179 150 - 400  K/uL   nRBC 0.0 0.0 - 0.2 %  Renal function panel     Status: Abnormal   Collection Time: 07/02/18  3:13 AM  Result Value Ref Range   Sodium 139 135 - 145 mmol/L   Potassium 3.4 (L) 3.5 - 5.1 mmol/L   Chloride 103 98 - 111 mmol/L   CO2 23 22 - 32 mmol/L   Glucose, Bld 130 (H) 70 - 99 mg/dL   BUN 29 (H) 8 - 23 mg/dL   Creatinine, Ser 09/01/18 (H) 0.61 - 1.24 mg/dL   Calcium 7.8 (L) 8.9 - 10.3 mg/dL   Phosphorus 3.9 2.5 -  4.6 mg/dL   Albumin 2.3 (L) 3.5 - 5.0 g/dL   GFR calc non Af Amer 11 (L) >60 mL/min   GFR calc Af Amer 13 (L) >60 mL/min   Anion gap 13 5 - 15  Magnesium     Status: None   Collection Time: 07/02/18  3:13 AM  Result Value Ref Range   Magnesium 2.0 1.7 - 2.4 mg/dL  Glucose, capillary     Status: Abnormal   Collection Time: 07/02/18  4:13 AM  Result Value Ref Range   Glucose-Capillary 120 (H) 70 - 99 mg/dL  Glucose, capillary     Status: Abnormal   Collection Time: 07/02/18  5:23 AM  Result Value Ref Range   Glucose-Capillary 136 (H) 70 - 99 mg/dL   Dg Swallowing Func-speech Pathology  Result Date: 07/01/2018 Objective Swallowing Evaluation: Type of Study: MBS-Modified Barium Swallow Study  Patient Details Name: Alexander Whitaker MRN: [id number] Date of Birth: 08-Oct-1950 Today's Date: 07/01/2018 Time: SLP Start Time (ACUTE ONLY): 5701 -SLP Stop Time (ACUTE ONLY): 1330 SLP Time Calculation (min) (ACUTE ONLY): 15 min Past Medical History: Past Medical History: Diagnosis Date . Anemia  . Arthritis   per pt (11/21/2017) . CHF (congestive heart failure) ([place]) ~ 2017 . CKD stage 3 due to type 2 diabetes mellitus ([place])   stage 5 Dialysis T/Th/Sa . CVA (cerebral vascular accident) [facility]) 2007  wife denies residual on 11/21/2017 . Diabetic peripheral neuropathy ([place])  . Dyspnea  . ESRD (end stage renal disease) on dialysis ([place])   "[place]; TTS" (11/21/2017) . GERD (gastroesophageal reflux disease)   has in the past . Headache   since going on dialysis . Hypertension  . Hypothyroidism  . PONV  (postoperative nausea and vomiting)  . Restless legs  . Type II diabetes mellitus ([place])  Past Surgical History: Past Surgical History: Procedure Laterality Date . AV FISTULA PLACEMENT Right 10/07/2016  Procedure: RIGHT BRACHIOCEPHALIC ARTERIOVENOUS (AV) FISTULA CREATION;  Surgeon: Angelia Mould, MD;  Location: [facility];  Service: Vascular;  Laterality: Right; . BACK SURGERY   . CATARACT EXTRACTION W/ INTRAOCULAR LENS IMPLANT Left  . CIRCUMCISION  2015 . HERNIA REPAIR  2016  "stomach" . LEFT HEART CATH AND CORONARY ANGIOGRAPHY N/A 06/27/2018  Procedure: LEFT HEART CATH AND CORONARY ANGIOGRAPHY;  Surgeon: Troy Sine, MD;  Location: [facility];  Service: Cardiovascular;  Laterality: N/A; . POSTERIOR LUMBAR FUSION  1990s?  "has bolts and rods in there" HPI: 68 year old male with history of esrd on hd, cva, htn, chf, presented to [facility] with ams.  He had fever of 101.6 at [place] and only responsive to deep stimuli.  Cxr showed diffuse infiltrates. Inutbated from 3/28 until 4/4. VT arrest requiring amiodarone and chest compressions on 06/27/2018, cath nonobstructive disease.  Subjective: alert, upright in chair; requires cueing Assessment / Plan / Recommendation CHL IP CLINICAL IMPRESSIONS 07/01/2018 Clinical Impression Pt presents with a mild oropharyngeal dysphagia. Oral deficitc c/b delayed AP transit, lingual pumping, reduced bolus cohesion, and mild oral residuals. Pt unable to propel 13 mm barium tablet to hypopharynx and eventually expecorated from oral cavity. Pharyngeal deficits c/b one instance of mistimed epiglottic inversion with reduced laryngeal vestibule closure resulting in penetration of thin liquids before the swallow (on initial thin series). No aspiration evidenced, no further penetration exhibited. Recommend dysphagia 2 (fine chopped) and thin liquids with medicines as tolerated and safe swallow strategies including full supervision with all PO. SLP to follow up.  SLP Visit Diagnosis  Dysphagia, oropharyngeal phase (R13.12) Attention and concentration deficit following -- Frontal lobe and executive function deficit following -- Impact on safety and function Mild aspiration risk   CHL IP TREATMENT RECOMMENDATION 07/01/2018 Treatment Recommendations Therapy as outlined in treatment plan below   Prognosis 07/01/2018 Prognosis for Safe Diet Advancement Good Barriers to Reach Goals Cognitive deficits Barriers/Prognosis Comment -- CHL IP DIET RECOMMENDATION 07/01/2018 SLP Diet Recommendations Dysphagia 2 (Fine chop) solids;Thin liquid Liquid Administration via Cup;Straw Medication Administration Whole meds with puree Compensations Minimize environmental distractions;Slow rate;Small sips/bites Postural Changes Seated upright at 90 degrees;Remain semi-upright after after feeds/meals (Comment)   CHL IP OTHER RECOMMENDATIONS 07/01/2018 Recommended Consults -- Oral Care Recommendations Oral care BID Other Recommendations --   CHL IP FOLLOW UP RECOMMENDATIONS 07/01/2018 Follow up Recommendations 24 hour supervision/assistance;Skilled Nursing facility   [facility] IP FREQUENCY AND DURATION 07/01/2018 Speech Therapy Frequency (ACUTE ONLY) min 2x/week Treatment Duration 1 week      CHL IP ORAL PHASE 07/01/2018 Oral Phase Impaired Oral - Pudding Teaspoon -- Oral - Pudding Cup -- Oral - Honey Teaspoon -- Oral - Honey Cup -- Oral - Nectar Teaspoon -- Oral - Nectar Cup -- Oral - Nectar Straw -- Oral - Thin Teaspoon -- Oral - Thin Cup -- Oral - Thin Straw -- Oral - Puree -- Oral - Mech Soft -- Oral - Regular -- Oral - Multi-Consistency -- Oral - Pill -- Oral Phase - Comment --  CHL IP PHARYNGEAL PHASE 07/01/2018 Pharyngeal Phase Impaired Pharyngeal- Pudding Teaspoon -- Pharyngeal -- Pharyngeal- Pudding Cup -- Pharyngeal -- Pharyngeal- Honey Teaspoon -- Pharyngeal -- Pharyngeal- Honey Cup -- Pharyngeal -- Pharyngeal- Nectar Teaspoon -- Pharyngeal -- Pharyngeal- Nectar Cup Delayed swallow initiation-pyriform sinuses Pharyngeal --  Pharyngeal- Nectar Straw -- Pharyngeal -- Pharyngeal- Thin Teaspoon -- Pharyngeal -- Pharyngeal- Thin Cup Reduced epiglottic inversion;Reduced airway/laryngeal closure;Penetration/Aspiration before swallow Pharyngeal Material enters airway, remains ABOVE vocal cords and not ejected out Pharyngeal- Thin Straw Delayed swallow initiation-pyriform sinuses Pharyngeal Material does not enter airway Pharyngeal- Puree Reduced tongue base retraction;Pharyngeal residue - valleculae Pharyngeal Material does not enter airway Pharyngeal- Mechanical Soft -- Pharyngeal -- Pharyngeal- Regular Delayed swallow initiation-vallecula;Pharyngeal residue - valleculae;Reduced tongue base retraction Pharyngeal Material does not enter airway Pharyngeal- Multi-consistency -- Pharyngeal -- Pharyngeal- Pill Other (Comment) Pharyngeal -- Pharyngeal Comment --  CHL IP CERVICAL ESOPHAGEAL PHASE 07/01/2018 Cervical Esophageal Phase WFL Pudding Teaspoon -- Pudding Cup -- Honey Teaspoon -- Honey Cup -- Nectar Teaspoon -- Nectar Cup -- Nectar Straw -- Thin Teaspoon -- Thin Cup -- Thin Straw -- Puree -- Mechanical Soft -- Regular -- Multi-consistency -- Pill -- Cervical Esophageal Comment -- Chelsea E Hartness MA, CCC-SLP 07/01/2018, 1:45 PM               Assessment/Plan: Diagnosis: Debility Labs independently reviewed.  Records reviewed and summated above.  1. Does the need for close, 24 hr/day medical supervision in concert with the patient's rehab needs make it unreasonable for this patient to be served in a less intensive setting? Yes  2. Co-Morbidities requiring supervision/potential complications: ESRD with hemodialysis (recs per nephro), CVA, HTN (currently with hypertensive crisis, monitor and provide prns in accordance with increased physical exertion and pain), combined CHF (Monitor in accordance with increased physical activity and avoid UE resistance exercises, monitor for signs and symptoms of fluid overload), DM (Monitor in accordance  with exercise and adjust meds as necessary), nonischemic cardiomyopathy, Dysphagia (advance diet as tolerated), anemia of chronic disease (repeat labs, consider transfusion if necessary to ensure  appropriate perfusion for increased activity tolerance), prolonged QT (recs per cards, avoid QT prolonging medications) 3. Due to bladder management, bowel management, safety, skin/wound care, disease management, medication administration, pain management and patient education, does the patient require 24 hr/day rehab nursing? Yes 4. Does the patient require coordinated care of a physician, rehab nurse, PT (1-2 hrs/day, 5 days/week), OT (1-2 hrs/day, 5 days/week) and SLP (1-2 hrs/day, 5 days/week) to address physical and functional deficits in the context of the above medical diagnosis(es)? Yes Addressing deficits in the following areas: balance, endurance, locomotion, strength, transferring, bathing, dressing, toileting, cognition, swallowing and psychosocial support 5. Can the patient actively participate in an intensive therapy program of at least 3 hrs of therapy per day at least 5 days per week? Potentially 6. The potential for patient to make measurable gains while on inpatient rehab is excellent 7. Anticipated functional outcomes upon discharge from inpatient rehab are supervision and min assist  with PT, supervision and min assist with OT, supervision with SLP. 8. Estimated rehab length of stay to reach the above functional goals is: 12-16 days. 9. Anticipated D/C setting: Home 10. Anticipated post D/C treatments: HH therapy and Home excercise program 11. Overall Rehab/Functional Prognosis: good  RECOMMENDATIONS: This patient's condition is appropriate for continued rehabilitative care in the following setting: Will await more thorough therapies, however likely CIR once medically stable and able to tolerate 3 hours of therapy per day. Patient has agreed to participate in recommended program.  Potentially Note that insurance prior authorization may be required for reimbursement for recommended care.  Comment: Rehab Admissions Coordinator to follow up.   I have personally performed a face to face diagnostic evaluation, including, but not limited to relevant history and physical exam findings, of this patient and developed relevant assessment and plan.  Additionally, I have reviewed and concur with the physician assistant's documentation above.   Delice Lesch, MD, ABPMR Lavon Paganini Angiulli, PA-C 07/02/2018

## 2018-07-02 NOTE — Progress Notes (Signed)
PROGRESS NOTE    Alexander Whitaker   SNK:[id number]  DOB: 05-01-1950  DOA: 06/24/2018 PCP: [name], MD   Brief Narrative:  Alexander Whitaker 68 y/o with ESRD on HD, CVA, HTN, CHF sent from [facility] for fever 101.6 and obtunded state for which he was intubated.  CXR showed diffuse infiltrates- started on Vanc, Ceftriaxone and Zithro. Blood sugar of 638, HONK, started on Insulin infusion. Hypotensive, started on Levophed.   Admitted to ICU team.   Femoral aline 06/24/18 RIJ 06/24/18  3/31-NSTEMI-  Troponin 2.75- Cardiology consulted 4/1- A-fib with RVR started on Amio infusion- subsequently had ? Torsades vs VT- compression, defib x 1> ROSC 4/2- EP consulted- suspected QT (which was already prolonged) became more prolonged with Amio, Azithromycin and abnormal electrolytes 4/1- Cardiac cath > no significant ischemia 4/2 Limited ECHO- normal EF, no diastolic dysfunction- mild to mod TR 4/4 Extubated  Subjective: - he has no complaints.   Assessment & Plan:   Principal Problem:   Sepsis- CAP-  Respiratory failure with hypoxia  - Ruled out for COVID - completed Ceftriaxone - weaned to room air  Active Problems:   Non-ST elevation (NSTEMI) myocardial infarction - Cath showing normal coronaries- see above    Cardiac arrest with ventricular fibrillation  - avoid QT prolonging meds- ensure electrolytes are normal    Acute diastolic congestive heart failure - CXR 4/3> Improving pulmonary edema pattern. Mild residual perihilar/infrahilar opacities. - fluid management with dialysis  Dysphagia after intubation - now on D 2 diet after SLP eval    Diabetes mellitus, type 2  - uses Levemir 10 U at home - currently on SSI    ESRD (end stage renal disease) - dialysis per nephrology  Anemia due to chronic disease - appreciate renal management of this    Essential hypertension - on Lopressor, Amlodipine  Hypothyroidism -cont Synthroid   GERD - on Protonix daily  at home and here     Time spent in minutes: 35 DVT prophylaxis: Heparin Code Status: Full code Family Communication:  Disposition Plan: CIR eval pending Consultants:   Admitted by [facility]  Cardiology  EP  Nephrology Procedures:  1. The left ventricle has normal systolic function, with an ejection fraction of 60-65%. The cavity size was normal. There is mildly increased left ventricular wall thickness. No evidence of left ventricular regional wall motion abnormalities.  2. The tricuspid valve was grossly normal. Tricuspid valve regurgitation is mild-moderate.  3. The aortic valve is tricuspid.  Antimicrobials:  Anti-infectives (From admission, onward)   Start     Dose/Rate Route Frequency Ordered Stop   06/28/18 1000  azithromycin (ZITHROMAX) 500 mg in sodium chloride 0.9 % 250 mL IVPB  Status:  Discontinued     500 mg 250 mL/hr over 60 Minutes Intravenous Every 24 hours 06/27/18 1007 06/28/18 0749   06/26/18 1200  vancomycin (VANCOCIN) IVPB 750 mg/150 ml premix  Status:  Discontinued     750 mg 150 mL/hr over 60 Minutes Intravenous Every T-Th-Sa (Hemodialysis) 06/24/18 0527 06/24/18 1316   06/24/18 2200  cefTRIAXone (ROCEPHIN) 1 g in sodium chloride 0.9 % 100 mL IVPB     1 g 200 mL/hr over 30 Minutes Intravenous Every 24 hours 06/24/18 0428 06/30/18 2153   06/24/18 2200  vancomycin (VANCOCIN) IVPB 750 mg/150 ml premix  Status:  Discontinued     750 mg 150 mL/hr over 60 Minutes Intravenous Every 24 hours 06/24/18 1316 06/26/18 0955   06/24/18 0500  azithromycin (ZITHROMAX)  500 mg in sodium chloride 0.9 % 250 mL IVPB  Status:  Discontinued     500 mg 250 mL/hr over 60 Minutes Intravenous Every 24 hours 06/24/18 0428 06/27/18 1007       Objective: Vitals:   07/02/18 0317 07/02/18 0324 07/02/18 0449 07/02/18 0522  BP: (!) 183/69 (!) 180/68 (!) 173/60 (!) 184/62  Pulse: 75 77 83 83  Resp: (!) 26     Temp: 98.6 F (37 C)     TempSrc: Oral     SpO2: 98%     Weight:       Height:        Intake/Output Summary (Last 24 hours) at 07/02/2018 0820 Last data filed at 07/02/2018 0520 Gross per 24 hour  Intake 231.05 ml  Output -  Net 231.05 ml   Filed Weights   06/30/18 1130 07/01/18 1225 07/02/18 0311  Weight: 70.3 kg 72.3 kg 72.9 kg    Examination: General exam: Appears comfortable  HEENT: PERRLA, oral mucosa moist, no sclera icterus or thrush Respiratory system: Clear to auscultation. Respiratory effort normal. Cardiovascular system: S1 & S2 heard, RRR.   Gastrointestinal system: Abdomen soft, non-tender, nondistended. Normal bowel sounds. Central nervous system: Alert - . No focal neurological deficits. Extremities: No cyanosis, clubbing or edema Skin: No rashes or ulcers Psychiatry:  Mood & affect appropriate.     Data Reviewed: I have personally reviewed following labs and imaging studies  CBC: Recent Labs  Lab 06/28/18 0459 06/29/18 0518 06/30/18 0320 07/01/18 0227 07/02/18 0313  WBC 10.1 5.4 5.9 5.8 4.7  NEUTROABS 7.7  --   --   --   --   HGB 9.0* 8.5* 8.3* 9.3* 9.1*  HCT 30.4* 28.7* 27.4* 30.1* 28.8*  MCV 100.0 98.3 96.1 95.6 92.9  PLT 158 123* 119* 133* 846   Basic Metabolic Panel: Recent Labs  Lab 06/26/18 0519  06/27/18 0437  06/27/18 1102 06/28/18 0459 06/29/18 0518 06/30/18 0320 07/02/18 0313  NA 138   < > 139   < > 143 143 143 138 139  K 4.3   < > 4.1   < > 3.6 4.1 3.3* 3.6 3.4*  CL 103   < > 103  --   --  107 109 102 103  CO2 23   < > 15*  --   --  17* 21* 24 23  GLUCOSE 131*   < > 157*  --   --  151* 177* 234* 130*  BUN 17   < > 26*  --   --  34* 49* 34* 29*  CREATININE 2.86*   < > 4.26*  --   --  6.09* 7.44* 4.23* 4.95*  CALCIUM 7.8*   < > 8.1*  --   --  7.7* 7.6* 7.4* 7.8*  MG 2.3   < > 2.6*  --   --  2.5* 2.4 1.9 2.0  PHOS 3.8  --  3.9  --   --  4.7*  --  3.1 3.9   < > = values in this interval not displayed.   GFR: Estimated Creatinine Clearance: 12.3 mL/min (A) (by C-G formula based on SCr of 4.95 mg/dL  (H)). Liver Function Tests: Recent Labs  Lab 06/25/18 1442 06/26/18 0519 06/30/18 0320 07/02/18 0313  ALBUMIN 2.5* 2.8* 2.1* 2.3*   No results for input(s): LIPASE, AMYLASE in the last 168 hours. No results for input(s): AMMONIA in the last 168 hours. Coagulation Profile: No results for input(s): INR, PROTIME  in the last 168 hours. Cardiac Enzymes: Recent Labs  Lab 06/26/18 2153 06/27/18 0437 06/27/18 1012  TROPONINI 2.05* 2.65* 2.75*   BNP (last 3 results) No results for input(s): PROBNP in the last 8760 hours. HbA1C: No results for input(s): HGBA1C in the last 72 hours. CBG: Recent Labs  Lab 07/01/18 2025 07/02/18 0019 07/02/18 0413 07/02/18 0523 07/02/18 0738  GLUCAP 123* 105* 120* 136* 119*   Lipid Profile: Recent Labs    06/30/18 0320  TRIG 173*   Thyroid Function Tests: No results for input(s): TSH, T4TOTAL, FREET4, T3FREE, THYROIDAB in the last 72 hours. Anemia Panel: Recent Labs    06/30/18 1552  FERRITIN 1,274*  TIBC 125*  IRON 13*   Urine analysis:    Component Value Date/Time   COLORURINE STRAW (A) 03/10/2016 1905   APPEARANCEUR CLEAR 03/10/2016 1905   LABSPEC 1.011 03/10/2016 1905   PHURINE 6.0 03/10/2016 1905   GLUCOSEU >=500 (A) 03/10/2016 1905   HGBUR SMALL (A) 03/10/2016 1905   BILIRUBINUR NEGATIVE 03/10/2016 [place] 03/10/2016 1905   PROTEINUR >=300 (A) 03/10/2016 1905   NITRITE NEGATIVE 03/10/2016 1905   LEUKOCYTESUR NEGATIVE 03/10/2016 1905   Sepsis Labs: $[email or web address]'[email or web address]$ (procalcitonin:4,lacticidven:4) ) Recent Results (from the past 240 hour(s))  MRSA PCR Screening     Status: None   Collection Time: 06/24/18  4:12 AM  Result Value Ref Range Status   MRSA by PCR NEGATIVE NEGATIVE Final    Comment:        The GeneXpert MRSA Assay (FDA approved for NASAL specimens only), is one component of a comprehensive MRSA colonization surveillance program. It is not intended to diagnose MRSA infection nor to guide or  monitor treatment for MRSA infections. Performed at [facility], [place] [address] [place], [place] [address]   Culture, blood (routine x 2) Call MD if unable to obtain prior to antibiotics being given     Status: None   Collection Time: 06/24/18  6:39 AM  Result Value Ref Range Status   Specimen Description BLOOD A-LINE DRAW  Final   Special Requests   Final    BOTTLES DRAWN AEROBIC AND ANAEROBIC Blood Culture adequate volume   Culture   Final    NO GROWTH 5 DAYS Performed at [facility], [place] [address], [place], [place] [address]    Report Status 06/29/2018 FINAL  Final  Culture, respiratory (non-expectorated)     Status: None   Collection Time: 06/24/18  7:35 AM  Result Value Ref Range Status   Specimen Description TRACHEAL ASPIRATE  Final   Special Requests NONE  Final   Gram Stain   Final    MODERATE WBC PRESENT, PREDOMINANTLY PMN RARE BUDDING YEAST SEEN Performed at [facility], [place] [address], [place], [place] [address]    Culture ABUNDANT CANDIDA [place]  Final   Report Status 06/27/2018 FINAL  Final  Culture, blood (routine x 2)     Status: None   Collection Time: 06/24/18  8:01 AM  Result Value Ref Range Status   Specimen Description BLOOD CENTRAL LINE  Final   Special Requests   Final    BOTTLES DRAWN AEROBIC AND ANAEROBIC Blood Culture adequate volume   Culture   Final    NO GROWTH 5 DAYS Performed at [facility], [place] [address] [place], [place] [address]    Report Status 06/29/2018 FINAL  Final         Radiology Studies: Dg Swallowing Func-speech  Pathology  Result Date: 07/01/2018 Objective Swallowing Evaluation: Type of Study: MBS-Modified Barium Swallow Study  Patient Details Name: Alexander Whitaker MRN: [id number] Date of Birth: [birth year] Today's Date: 07/01/2018 Time: SLP Start Time (ACUTE ONLY): 1315 -SLP Stop Time (ACUTE ONLY): 1330 SLP Time Calculation (min) (ACUTE ONLY): 15 min Past Medical History: Past Medical History:  Diagnosis Date . Anemia  . Arthritis   per pt (11/21/2017) . CHF (congestive heart failure) ([place]) ~ 2017 . CKD stage 3 due to type 2 diabetes mellitus ([place])   stage 5 Dialysis T/Th/Sa . CVA (cerebral vascular accident) [facility]) 2007  wife denies residual on 11/21/2017 . Diabetic peripheral neuropathy ([place])  . Dyspnea  . ESRD (end stage renal disease) on dialysis ([place])   "[place]; TTS" (11/21/2017) . GERD (gastroesophageal reflux disease)   has in the past . Headache   since going on dialysis . Hypertension  . Hypothyroidism  . PONV (postoperative nausea and vomiting)  . Restless legs  . Type II diabetes mellitus ([place])  Past Surgical History: Past Surgical History: Procedure Laterality Date . AV FISTULA PLACEMENT Right 10/07/2016  Procedure: RIGHT BRACHIOCEPHALIC ARTERIOVENOUS (AV) FISTULA CREATION;  Surgeon: Angelia Mould, MD;  Location: [place];  Service: Vascular;  Laterality: Right; . BACK SURGERY   . CATARACT EXTRACTION W/ INTRAOCULAR LENS IMPLANT Left  . CIRCUMCISION  2015 . HERNIA REPAIR  2016  "stomach" . LEFT HEART CATH AND CORONARY ANGIOGRAPHY N/A 06/27/2018  Procedure: LEFT HEART CATH AND CORONARY ANGIOGRAPHY;  Surgeon: Troy Sine, MD;  Location: [facility];  Service: Cardiovascular;  Laterality: N/A; . POSTERIOR LUMBAR FUSION  1990s?  "has bolts and rods in there" HPI: 68 year old male with history of esrd on hd, cva, htn, chf, presented to [place] with ams.  He had fever of 101.6 at [place] and only responsive to deep stimuli.  Cxr showed diffuse infiltrates. Inutbated from 3/28 until 4/4. VT arrest requiring amiodarone and chest compressions on 06/27/2018, cath nonobstructive disease.  Subjective: alert, upright in chair; requires cueing Assessment / Plan / Recommendation CHL IP CLINICAL IMPRESSIONS 07/01/2018 Clinical Impression Pt presents with a mild oropharyngeal dysphagia. Oral deficitc c/b delayed AP transit, lingual pumping, reduced bolus cohesion, and mild oral residuals. Pt unable to  propel 13 mm barium tablet to hypopharynx and eventually expecorated from oral cavity. Pharyngeal deficits c/b one instance of mistimed epiglottic inversion with reduced laryngeal vestibule closure resulting in penetration of thin liquids before the swallow (on initial thin series). No aspiration evidenced, no further penetration exhibited. Recommend dysphagia 2 (fine chopped) and thin liquids with medicines as tolerated and safe swallow strategies including full supervision with all PO. SLP to follow up.  SLP Visit Diagnosis Dysphagia, oropharyngeal phase (R13.12) Attention and concentration deficit following -- Frontal lobe and executive function deficit following -- Impact on safety and function Mild aspiration risk   CHL IP TREATMENT RECOMMENDATION 07/01/2018 Treatment Recommendations Therapy as outlined in treatment plan below   Prognosis 07/01/2018 Prognosis for Safe Diet Advancement Good Barriers to Reach Goals Cognitive deficits Barriers/Prognosis Comment -- CHL IP DIET RECOMMENDATION 07/01/2018 SLP Diet Recommendations Dysphagia 2 (Fine chop) solids;Thin liquid Liquid Administration via Cup;Straw Medication Administration Whole meds with puree Compensations Minimize environmental distractions;Slow rate;Small sips/bites Postural Changes Seated upright at 90 degrees;Remain semi-upright after after feeds/meals (Comment)   CHL IP OTHER RECOMMENDATIONS 07/01/2018 Recommended Consults -- Oral Care Recommendations Oral care BID Other Recommendations --   CHL IP FOLLOW UP RECOMMENDATIONS 07/01/2018 Follow up Recommendations 24 hour  supervision/assistance;Skilled Nursing facility   [facility] IP FREQUENCY AND DURATION 07/01/2018 Speech Therapy Frequency (ACUTE ONLY) min 2x/week Treatment Duration 1 week      CHL IP ORAL PHASE 07/01/2018 Oral Phase Impaired Oral - Pudding Teaspoon -- Oral - Pudding Cup -- Oral - Honey Teaspoon -- Oral - Honey Cup -- Oral - Nectar Teaspoon -- Oral - Nectar Cup -- Oral - Nectar Straw -- Oral - Thin  Teaspoon -- Oral - Thin Cup -- Oral - Thin Straw -- Oral - Puree -- Oral - Mech Soft -- Oral - Regular -- Oral - Multi-Consistency -- Oral - Pill -- Oral Phase - Comment --  CHL IP PHARYNGEAL PHASE 07/01/2018 Pharyngeal Phase Impaired Pharyngeal- Pudding Teaspoon -- Pharyngeal -- Pharyngeal- Pudding Cup -- Pharyngeal -- Pharyngeal- Honey Teaspoon -- Pharyngeal -- Pharyngeal- Honey Cup -- Pharyngeal -- Pharyngeal- Nectar Teaspoon -- Pharyngeal -- Pharyngeal- Nectar Cup Delayed swallow initiation-pyriform sinuses Pharyngeal -- Pharyngeal- Nectar Straw -- Pharyngeal -- Pharyngeal- Thin Teaspoon -- Pharyngeal -- Pharyngeal- Thin Cup Reduced epiglottic inversion;Reduced airway/laryngeal closure;Penetration/Aspiration before swallow Pharyngeal Material enters airway, remains ABOVE vocal cords and not ejected out Pharyngeal- Thin Straw Delayed swallow initiation-pyriform sinuses Pharyngeal Material does not enter airway Pharyngeal- Puree Reduced tongue base retraction;Pharyngeal residue - valleculae Pharyngeal Material does not enter airway Pharyngeal- Mechanical Soft -- Pharyngeal -- Pharyngeal- Regular Delayed swallow initiation-vallecula;Pharyngeal residue - valleculae;Reduced tongue base retraction Pharyngeal Material does not enter airway Pharyngeal- Multi-consistency -- Pharyngeal -- Pharyngeal- Pill Other (Comment) Pharyngeal -- Pharyngeal Comment --  CHL IP CERVICAL ESOPHAGEAL PHASE 07/01/2018 Cervical Esophageal Phase WFL Pudding Teaspoon -- Pudding Cup -- Honey Teaspoon -- Honey Cup -- Nectar Teaspoon -- Nectar Cup -- Nectar Straw -- Thin Teaspoon -- Thin Cup -- Thin Straw -- Puree -- Mechanical Soft -- Regular -- Multi-consistency -- Pill -- Cervical Esophageal Comment -- Chelsea E Hartness MA, CCC-SLP 07/01/2018, 1:45 PM                 Scheduled Meds: . amLODipine  10 mg Oral Daily  . aspirin  81 mg Per Tube Daily  . chlorhexidine  15 mL Mouth Rinse BID  . Chlorhexidine Gluconate Cloth  6 each Topical Q0600   . [START ON 07/03/2018] darbepoetin (ARANESP) injection - NON-DIALYSIS  150 mcg Subcutaneous Q Tue-HD  . heparin  5,000 Units Subcutaneous Q8H  . insulin aspart  0-24 Units Subcutaneous Q4H  . levothyroxine  50 mcg Per Tube Q0600  . mouth rinse  15 mL Mouth Rinse q12n4p  . metoprolol tartrate  25 mg Oral BID  . pantoprazole sodium  40 mg Per Tube Q24H  . sodium chloride flush  3 mL Intravenous Q12H   Continuous Infusions: . sodium chloride Stopped (07/01/18 1007)  . [START ON 07/03/2018] ferric gluconate (FERRLECIT/NULECIT) IV    . sodium chloride 250 mL (06/28/18 1511)     LOS: 8 days      Debbe Odea, MD [facility] Pager: [email or web address] Password [email or web address] 07/02/2018, 8:20 AM

## 2018-07-02 NOTE — Progress Notes (Signed)
  Speech Language Pathology Treatment: Dysphagia  Patient Details Name: Alexander Whitaker MRN: [id number] DOB: 05/24/1950 Today's Date: 07/02/2018 Time: 2694-8546 SLP Time Calculation (min) (ACUTE ONLY): 15 min  Assessment / Plan / Recommendation Clinical Impression  Pt seen at bedside following MBS completed 07/01/2018. Pt reported not receiving breakfast this morning. Nursing confirmed this, stating lunch was received at 1030. Nursing reports pt tolerating current (dys2.thin) diet. Pt was observed eating a graham cracker and drinking a soda. Extended oral prep of cracker, likely due to being edentulous. No overt s/s aspiration observed or reported by pt. Safe swallow precautions posted at [facility]. SLP will follow briefly for diet tolerance and education. Will continue dys2/thin liquid diet at this time.   HPI HPI: 68 year old male with history of esrd on hd, cva, htn, chf, presented to [place] with ams.  He had fever of 101.6 at [place] and only responsive to deep stimuli.  Cxr showed diffuse infiltrates. Inutbated from 3/28 until 4/4. VT arrest requiring amiodarone and chest compressions on 06/27/2018, cath nonobstructive disease.      SLP Plan  Continue with current plan of care       Recommendations  Diet recommendations: Dysphagia 2 (fine chop);Thin liquid Liquids provided via: Cup;Straw Medication Administration: Whole meds with puree Supervision: Full supervision/cueing for compensatory strategies Compensations: Minimize environmental distractions;Slow rate;Small sips/bites Postural Changes and/or Swallow Maneuvers: Seated upright 90 degrees;Upright 30-60 min after meal                Oral Care Recommendations: Oral care QID Follow up Recommendations: 24 hour supervision/assistance;Skilled Nursing facility SLP Visit Diagnosis: Dysphagia, oropharyngeal phase (R13.12) Plan: Continue with current plan of care       GO              Alezandra Egli B. Quentin Ore [facility], CCC-SLP Speech Language  Pathologist [phone]  Alexander Whitaker 07/02/2018, 1:17 PM

## 2018-07-02 NOTE — Progress Notes (Signed)
eLink Physician-Brief Progress Note Patient Name: Alexander Whitaker DOB: March 15, 1951 MRN: [id number]   Date of Service  07/02/2018  HPI/Events of Note  Pt transferred out of the ICU 07/01/18.  Notified on hypertension with SBP in the 180s despite hydralazine IV.   eICU Interventions  Give 1 dose labetalol IV now.  Restart amlodipine 10mg  in the AM.      Intervention Category Intermediate Interventions: Hypertension - evaluation and management  Elsie Lincoln 07/02/2018, 5:57 AM

## 2018-07-03 LAB — RENAL FUNCTION PANEL
Albumin: 2.4 g/dL — ABNORMAL LOW (ref 3.5–5.0)
Anion gap: 17 — ABNORMAL HIGH (ref 5–15)
BUN: 38 mg/dL — ABNORMAL HIGH (ref 8–23)
CO2: 21 mmol/L — ABNORMAL LOW (ref 22–32)
Calcium: 7.6 mg/dL — ABNORMAL LOW (ref 8.9–10.3)
Chloride: 104 mmol/L (ref 98–111)
Creatinine, Ser: 6.3 mg/dL — ABNORMAL HIGH (ref 0.61–1.24)
GFR calc Af Amer: 10 mL/min — ABNORMAL LOW (ref >60)
GFR calc non Af Amer: 8 mL/min — ABNORMAL LOW (ref >60)
Glucose, Bld: 190 mg/dL — ABNORMAL HIGH (ref 70–99)
Phosphorus: 4.7 mg/dL — ABNORMAL HIGH (ref 2.5–4.6)
Potassium: 3.3 mmol/L — ABNORMAL LOW (ref 3.5–5.1)
Sodium: 142 mmol/L (ref 135–145)

## 2018-07-03 LAB — GLUCOSE, CAPILLARY
Glucose-Capillary: 145 mg/dL — ABNORMAL HIGH (ref 70–99)
Glucose-Capillary: 139 mg/dL — ABNORMAL HIGH (ref 70–99)
Glucose-Capillary: 213 mg/dL — ABNORMAL HIGH (ref 70–99)
Glucose-Capillary: 155 mg/dL — ABNORMAL HIGH (ref 70–99)

## 2018-07-03 LAB — BASIC METABOLIC PANEL
Anion gap: 14 (ref 5–15)
BUN: 15 mg/dL (ref 8–23)
CO2: 26 mmol/L (ref 22–32)
Calcium: 8.1 mg/dL — ABNORMAL LOW (ref 8.9–10.3)
Chloride: 99 mmol/L (ref 98–111)
Creatinine, Ser: 3.05 mg/dL — ABNORMAL HIGH (ref 0.61–1.24)
GFR calc Af Amer: 23 mL/min — ABNORMAL LOW (ref >60)
GFR calc non Af Amer: 20 mL/min — ABNORMAL LOW (ref >60)
Glucose, Bld: 240 mg/dL — ABNORMAL HIGH (ref 70–99)
Potassium: 3.7 mmol/L (ref 3.5–5.1)
Sodium: 139 mmol/L (ref 135–145)

## 2018-07-03 LAB — CBC
HCT: 29.7 % — ABNORMAL LOW (ref 39.0–52.0)
Hemoglobin: 9.2 g/dL — ABNORMAL LOW (ref 13.0–17.0)
MCH: 29.1 pg (ref 26.0–34.0)
MCHC: 31 g/dL (ref 30.0–36.0)
MCV: 94 fL (ref 80.0–100.0)
Platelets: 221 10*3/uL (ref 150–400)
RBC: 3.16 MIL/uL — ABNORMAL LOW (ref 4.22–5.81)
RDW: 14.4 % (ref 11.5–15.5)
WBC: 4.7 10*3/uL (ref 4.0–10.5)
nRBC: 0 % (ref 0.0–0.2)

## 2018-07-03 MED ORDER — NEPRO/CARBSTEADY PO LIQD
237.0000 mL | Freq: Two times a day (BID) | ORAL | Status: DC
  Administered 2018-07-03 – 2018-07-09 (×4): 237 mL via ORAL
  Filled 2018-07-03 (×8): qty 237

## 2018-07-03 MED ORDER — LISINOPRIL 5 MG PO TABS
5.0000 mg | ORAL_TABLET | Freq: Every day | ORAL | Status: DC
  Administered 2018-07-03 – 2018-07-09 (×6): 5 mg via ORAL
  Filled 2018-07-03 (×7): qty 1

## 2018-07-03 MED ORDER — METOPROLOL TARTRATE 50 MG PO TABS
50.0000 mg | ORAL_TABLET | Freq: Two times a day (BID) | ORAL | Status: DC
  Administered 2018-07-03 (×2): 50 mg
  Filled 2018-07-03 (×2): qty 1

## 2018-07-03 MED ORDER — PANTOPRAZOLE SODIUM 40 MG PO TBEC
40.0000 mg | DELAYED_RELEASE_TABLET | Freq: Every day | ORAL | Status: DC
  Administered 2018-07-04 – 2018-07-05 (×2): 40 mg via ORAL
  Filled 2018-07-03 (×3): qty 1

## 2018-07-03 MED ORDER — ALTEPLASE 2 MG IJ SOLR: 2.0000 mg | Freq: Once | INTRAMUSCULAR | Status: DC | PRN

## 2018-07-03 MED ORDER — DARBEPOETIN ALFA 150 MCG/0.3ML IJ SOSY
PREFILLED_SYRINGE | INTRAMUSCULAR | Status: AC
  Administered 2018-07-03: 150 ug via INTRAVENOUS
  Filled 2018-07-03: qty 0.3

## 2018-07-03 MED ORDER — SODIUM CHLORIDE 0.9 % IV SOLN: 100.0000 mL | INTRAVENOUS | Status: DC | PRN

## 2018-07-03 MED ORDER — LIDOCAINE HCL (PF) 1 % IJ SOLN: 5.0000 mL | INTRAMUSCULAR | Status: DC | PRN

## 2018-07-03 MED ORDER — HEPARIN SODIUM (PORCINE) 1000 UNIT/ML DIALYSIS: 1000.0000 [IU] | INTRAMUSCULAR | Status: DC | PRN

## 2018-07-03 MED ORDER — PENTAFLUOROPROP-TETRAFLUOROETH EX AERO: 1.0000 "application " | INHALATION_SPRAY | CUTANEOUS | Status: DC | PRN

## 2018-07-03 MED ORDER — LIDOCAINE-PRILOCAINE 2.5-2.5 % EX CREA: 1.0000 "application " | TOPICAL_CREAM | CUTANEOUS | Status: DC | PRN

## 2018-07-03 MED ORDER — PROCHLORPERAZINE EDISYLATE 10 MG/2ML IJ SOLN
5.0000 mg | Freq: Four times a day (QID) | INTRAMUSCULAR | Status: DC | PRN
  Administered 2018-07-03 (×2): 5 mg via INTRAVENOUS
  Filled 2018-07-03 (×2): qty 2

## 2018-07-03 NOTE — Consult Note (Signed)
   [facility] CM Inpatient Consult   07/03/2018  Alexander Whitaker [birth year] [id number]    Patient was screened for potential [facility] Care Management services needs under his [facility] plan with extreme risk score (35%) for unplanned readmission.     Per chart review, this 68 year old gentleman admitted with fever and sob, subsequently developed PMVT in the setting of long QT in the setting of QT prolonging drugs. He was sent from [facility] for fever 101.6 and obtunded state for which he was intubated. He has history of community-acquired pneumonia and sepsis and pulmonary infiltrates; history significant with ESRD on HD, CVA, HTN, CHF.  Patient is not currently a beneficiary of the attributed [place] in the [employer].   This patient is currently Not eligible for [facility] Care Management Services. Membership roster used to verify non-eligible status.    Will sign off.   Eufemia Prindle A. Haik Mahoney, BSN, RN-BC [facility] Liaison Cell: [phone]

## 2018-07-03 NOTE — TOC Initial Note (Addendum)
Transition of Care [facility]) - Initial/Assessment Note    Patient Details  Name: Alexander Whitaker MRN: [id number] Date of Birth: Apr 21, 1950  Transition of Care [facility]) CM/SW Contact:    Bethena Roys, RN Phone Number: 07/03/2018, 1:43 PM  Clinical Narrative:   Pt presented for fever and sob. Hx ESRD- HD TTS schedule. Patient and wife refused CIR and wife wants patient to return home. Patient was previously active with [facility] and wife wants patient to use them again. Referral made to Cassie with [facility] PT/OT orders will be needed to transition home with F2F. Pt has DME 3n1 and WC at home. Per wife they live in a double wide and she has people working on building a ramp now. CM did call PT and they will work with patient today. Only DME needs at this time is for a RW- pt will need order placed. Will be delivered via [employer] to the room prior to transition home. CM did ask wife if she wanted patient to use ambulance transport home- wife stated she will pick the patient up to transport via private vehicle. CM will continue to monitor for additional transition of care needs.             Pt will need order for DME [facility] and 3n1 Expected Discharge Plan: [place] Barriers to Discharge: No Barriers Identified   Patient Goals and CMS Choice Patient states their goals for this hospitalization and ongoing recovery are:: "Pt is ready to return home" CMS Medicare.gov Compare Post Acute Care list provided to:: (Wife did not need- they had used [facility] in the past. ) Choice offered to / list presented to : Spouse(Patient has used [facility] in the past.)  Expected Discharge Plan and Services Expected Discharge Plan: [place] In-house Referral: NA Discharge Planning Services: CM Consult Post Acute Care Choice: Durable Medical Equipment, Home Health Living arrangements for the past 2 months: Mobile Home(Double-Wide)                 DME  Arranged: [place] rolling DME Agency: [facility] HH Arranged: PT, OT HH Agency: [facility]  Prior Living Arrangements/Services Living arrangements for the past 2 months: Mobile Home(Double-Wide) Lives with:: Spouse Patient language and need for interpreter reviewed:: Yes Do you feel safe going back to the place where you live?: Yes      Need for Family Participation in Patient Care: Yes (Comment) Care giver support system in place?: Yes (comment) Current home services: DME(Pt has WC and 3n1) Criminal Activity/Legal Involvement Pertinent to Current Situation/Hospitalization: No - Comment as needed  Permission Sought/Granted Permission sought to share information with : Family Supports     Emotional Assessment Appearance:: Appears stated age Attitude/Demeanor/Rapport: Engaged Affect (typically observed): Accepting Orientation: : Oriented to Self Alcohol / Substance Use: Not Applicable Psych Involvement: No (comment)  Admission diagnosis:  Respiratory failure, acute ([place]) [J96.00] Diabetic acidosis ([place]) [E11.10] COVID-19 virus infection [U07.1] Patient Active Problem List   Diagnosis Date Noted  . History of CVA (cerebrovascular accident)   . Acute on chronic combined systolic and diastolic CHF (congestive heart failure) ([place])   . Dysphagia   . Anemia of chronic disease   . Prolonged QT interval   . Cardiac arrest with ventricular fibrillation ([place]) 06/27/2018  . Non-ST elevation (NSTEMI) myocardial infarction ([place]) 06/27/2018  . Sepsis ([place]) 06/24/2018  . Respiratory failure with hypoxia ([place]) 02/06/2018  . Acute encephalopathy 11/21/2017  . Dysarthria  11/21/2017  . ESRD (end stage renal disease) ([place]) 11/21/2017  . Essential hypertension 11/21/2017  . Normocytic anemia 11/21/2017  . Acute renal failure superimposed on chronic kidney disease ([place]) 03/10/2016  . Acute diastolic congestive heart failure ([place]) 03/10/2016  . Diabetes mellitus, type 2 ([place])  03/10/2016  . Malignant hypertension 03/10/2016  . Hypothyroidism 03/10/2016  . Tobacco use disorder 03/10/2016  . Acute renal failure (ARF) ([place]) 03/10/2016   PCP:  [name], MD Pharmacy:   [facility] [place], [place] [address] Phone: [phone] Fax: [phone]  [facility] [address] [place] [address] [place] [address] Phone: [phone] Fax: [phone]     Social Determinants of Health (SDOH) Interventions    Readmission Risk Interventions Readmission Risk Prevention Plan 11/24/2017  Transportation Screening Complete  PCP follow-up Complete  Some recent data might be hidden

## 2018-07-03 NOTE — Progress Notes (Addendum)
Progress Note  Patient Name: Alexander Whitaker Date of Encounter: 07/03/2018  Primary Cardiologist: Glenetta Hew, MD   Subjective   Seen in HD, crying and upset because wife is not here. No CP or palpitations.  Inpatient Medications    Scheduled Meds: . amLODipine  10 mg Oral Daily  . aspirin  81 mg Per Tube Daily  . chlorhexidine  15 mL Mouth Rinse BID  . Chlorhexidine Gluconate Cloth  6 each Topical Q0600  . Chlorhexidine Gluconate Cloth  6 each Topical Q0600  . darbepoetin (ARANESP) injection - DIALYSIS  150 mcg Intravenous Q Tue-HD  . feeding supplement (PRO-STAT SUGAR FREE 64)  30 mL Oral BID  . heparin  5,000 Units Subcutaneous Q8H  . insulin aspart  0-5 Units Subcutaneous QHS  . insulin aspart  0-9 Units Subcutaneous TID WC  . levothyroxine  50 mcg Per Tube Q0600  . lisinopril  5 mg Oral Daily  . mouth rinse  15 mL Mouth Rinse q12n4p  . metoprolol tartrate  50 mg Per Tube BID  . multivitamin  1 tablet Oral QHS  . pantoprazole sodium  40 mg Oral Q24H  . sodium chloride flush  3 mL Intravenous Q12H   Continuous Infusions: . sodium chloride Stopped (07/01/18 1007)  . sodium chloride    . sodium chloride    . ferric gluconate (FERRLECIT/NULECIT) IV 125 mg (07/03/18 1000)  . sodium chloride 250 mL (06/28/18 1511)   PRN Meds: sodium chloride, sodium chloride, sodium chloride, acetaminophen, alteplase, heparin, hydrALAZINE, lidocaine (PF), lidocaine-prilocaine, pentafluoroprop-tetrafluoroeth, prochlorperazine, sodium chloride, sodium chloride flush   Vital Signs    Vitals:   07/02/18 2112 07/03/18 0312 07/03/18 0537 07/03/18 0621  BP: (!) 180/70 (!) 183/72 (!) 180/68   Pulse: 89 86 88   Resp: (!) 24 (!) 25    Temp: 98.2 F (36.8 C) 98.7 F (37.1 C)    TempSrc: Oral Oral    SpO2: 95% 98%    Weight:    70.6 kg  Height:        Intake/Output Summary (Last 24 hours) at 07/03/2018 1006 Last data filed at 07/03/2018 0531 Gross per 24 hour  Intake 523 ml  Output -   Net 523 ml   Last 3 Weights 07/03/2018 07/02/2018 07/01/2018  Weight (lbs) 155 lb 10.3 oz 160 lb 11.5 oz 159 lb 6.4 oz  Weight (kg) 70.6 kg 72.9 kg 72.303 kg      Telemetry    SR, no sig ectopy- Personally Reviewed  ECG    07/03/2018: SR, HR 88, QT 508, QTc 614 ms.  06/29/2018 ECG, HR 71, QT 588, QTc 638 ms  Personally Reviewed  Physical Exam   General: Well developed, well nourished, male in emotional distress Head: Eyes PERRLA, No xanthomas.   Normocephalic and atraumatic Lungs: Clear anteriorly to auscultation. Heart: HRRR S1 S2, without MRG.  Pulses are 2+ & equal. No JVD. Abdomen: Bowel sounds are present, abdomen soft and non-tender without masses or  hernias noted. Msk: Normal strength and tone for age. Extremities: No clubbing, cyanosis or edema.    Skin:  No rashes or lesions noted. Neuro: Alert and oriented X 3. Psych:  Good affect, responds appropriately   Labs    Chemistry Recent Labs  Lab 06/30/18 0320 07/02/18 0313 07/03/18 0649  NA 138 139 142  K 3.6 3.4* 3.3*  CL 102 103 104  CO2 24 23 21*  GLUCOSE 234* 130* 190*  BUN 34* 29* 38*  CREATININE  4.23* 4.95* 6.30*  CALCIUM 7.4* 7.8* 7.6*  ALBUMIN 2.1* 2.3* 2.4*  GFRNONAA 13* 11* 8*  GFRAA 15* 13* 10*  ANIONGAP 12 13 17*     Hematology Recent Labs  Lab 07/01/18 0227 07/02/18 0313 07/03/18 0649  WBC 5.8 4.7 4.7  RBC 3.15* 3.10* 3.16*  HGB 9.3* 9.1* 9.2*  HCT 30.1* 28.8* 29.7*  MCV 95.6 92.9 94.0  MCH 29.5 29.4 29.1  MCHC 30.9 31.6 31.0  RDW 14.6 14.4 14.4  PLT 133* 179 221    Cardiac Enzymes Recent Labs  Lab 06/26/18 2153 06/27/18 0437 06/27/18 1012  TROPONINI 2.05* 2.65* 2.75*   Magnesium  Date Value Ref Range Status  07/02/2018 2.0 1.7 - 2.4 mg/dL Final    Comment:    Performed at [facility], [address], [place], [place] [address]     Radiology    Dg Swallowing Func-speech Pathology  Result Date: 07/01/2018 Objective Swallowing Evaluation: Type of Study:  MBS-Modified Barium Swallow Study  Patient Details Name: Alexander Whitaker MRN: [id number] Date of Birth: September 12, 1950 Today's Date: 07/01/2018 Time: SLP Start Time (ACUTE ONLY): 1315 -SLP Stop Time (ACUTE ONLY): 1330 SLP Time Calculation (min) (ACUTE ONLY): 15 min Past Medical History: Past Medical History: Diagnosis Date . Anemia  . Arthritis   per pt (11/21/2017) . CHF (congestive heart failure) (HCC) ~ 2017 . CKD stage 3 due to type 2 diabetes mellitus (HCC)   stage 5 Dialysis T/Th/Sa . CVA (cerebral vascular accident) [facility]) 2007  wife denies residual on 11/21/2017 . Diabetic peripheral neuropathy (HCC)  . Dyspnea  . ESRD (end stage renal disease) on dialysis (HCC)   "[place]; TTS" (11/21/2017) . GERD (gastroesophageal reflux disease)   has in the past . Headache   since going on dialysis . Hypertension  . Hypothyroidism  . PONV (postoperative nausea and vomiting)  . Restless legs  . Type II diabetes mellitus (HCC)  Past Surgical History: Past Surgical History: Procedure Laterality Date . AV FISTULA PLACEMENT Right 10/07/2016  Procedure: RIGHT BRACHIOCEPHALIC ARTERIOVENOUS (AV) FISTULA CREATION;  Surgeon: Chuck Hint, MD;  Location: [facility] OR;  Service: Vascular;  Laterality: Right; . BACK SURGERY   . CATARACT EXTRACTION W/ INTRAOCULAR LENS IMPLANT Left  . CIRCUMCISION  2015 . HERNIA REPAIR  2016  "stomach" . LEFT HEART CATH AND CORONARY ANGIOGRAPHY N/A 06/27/2018  Procedure: LEFT HEART CATH AND CORONARY ANGIOGRAPHY;  Surgeon: Lennette Bihari, MD;  Location: [facility] INVASIVE CV LAB;  Service: Cardiovascular;  Laterality: N/A; . POSTERIOR LUMBAR FUSION  1990s?  "has bolts and rods in there" HPI: 68 year old male with history of esrd on hd, cva, htn, chf, presented to [facility] with ams.  He had fever of 101.6 at [place] and only responsive to deep stimuli.  Cxr showed diffuse infiltrates. Inutbated from 3/28 until 4/4. VT arrest requiring amiodarone and chest compressions on 06/27/2018, cath nonobstructive disease.   Subjective: alert, upright in chair; requires cueing Assessment / Plan / Recommendation CHL IP CLINICAL IMPRESSIONS 07/01/2018 Clinical Impression Pt presents with a mild oropharyngeal dysphagia. Oral deficitc c/b delayed AP transit, lingual pumping, reduced bolus cohesion, and mild oral residuals. Pt unable to propel 13 mm barium tablet to hypopharynx and eventually expecorated from oral cavity. Pharyngeal deficits c/b one instance of mistimed epiglottic inversion with reduced laryngeal vestibule closure resulting in penetration of thin liquids before the swallow (on initial thin series). No aspiration evidenced, no further penetration exhibited. Recommend dysphagia 2 (fine chopped) and thin liquids with  medicines as tolerated and safe swallow strategies including full supervision with all PO. SLP to follow up.  SLP Visit Diagnosis Dysphagia, oropharyngeal phase (R13.12) Attention and concentration deficit following -- Frontal lobe and executive function deficit following -- Impact on safety and function Mild aspiration risk   CHL IP TREATMENT RECOMMENDATION 07/01/2018 Treatment Recommendations Therapy as outlined in treatment plan below   Prognosis 07/01/2018 Prognosis for Safe Diet Advancement Good Barriers to Reach Goals Cognitive deficits Barriers/Prognosis Comment -- CHL IP DIET RECOMMENDATION 07/01/2018 SLP Diet Recommendations Dysphagia 2 (Fine chop) solids;Thin liquid Liquid Administration via Cup;Straw Medication Administration Whole meds with puree Compensations Minimize environmental distractions;Slow rate;Small sips/bites Postural Changes Seated upright at 90 degrees;Remain semi-upright after after feeds/meals (Comment)   CHL IP OTHER RECOMMENDATIONS 07/01/2018 Recommended Consults -- Oral Care Recommendations Oral care BID Other Recommendations --   CHL IP FOLLOW UP RECOMMENDATIONS 07/01/2018 Follow up Recommendations 24 hour supervision/assistance;Skilled Nursing facility   [facility] IP FREQUENCY AND DURATION 07/01/2018  Speech Therapy Frequency (ACUTE ONLY) min 2x/week Treatment Duration 1 week      CHL IP ORAL PHASE 07/01/2018 Oral Phase Impaired Oral - Pudding Teaspoon -- Oral - Pudding Cup -- Oral - Honey Teaspoon -- Oral - Honey Cup -- Oral - Nectar Teaspoon -- Oral - Nectar Cup -- Oral - Nectar Straw -- Oral - Thin Teaspoon -- Oral - Thin Cup -- Oral - Thin Straw -- Oral - Puree -- Oral - Mech Soft -- Oral - Regular -- Oral - Multi-Consistency -- Oral - Pill -- Oral Phase - Comment --  CHL IP PHARYNGEAL PHASE 07/01/2018 Pharyngeal Phase Impaired Pharyngeal- Pudding Teaspoon -- Pharyngeal -- Pharyngeal- Pudding Cup -- Pharyngeal -- Pharyngeal- Honey Teaspoon -- Pharyngeal -- Pharyngeal- Honey Cup -- Pharyngeal -- Pharyngeal- Nectar Teaspoon -- Pharyngeal -- Pharyngeal- Nectar Cup Delayed swallow initiation-pyriform sinuses Pharyngeal -- Pharyngeal- Nectar Straw -- Pharyngeal -- Pharyngeal- Thin Teaspoon -- Pharyngeal -- Pharyngeal- Thin Cup Reduced epiglottic inversion;Reduced airway/laryngeal closure;Penetration/Aspiration before swallow Pharyngeal Material enters airway, remains ABOVE vocal cords and not ejected out Pharyngeal- Thin Straw Delayed swallow initiation-pyriform sinuses Pharyngeal Material does not enter airway Pharyngeal- Puree Reduced tongue base retraction;Pharyngeal residue - valleculae Pharyngeal Material does not enter airway Pharyngeal- Mechanical Soft -- Pharyngeal -- Pharyngeal- Regular Delayed swallow initiation-vallecula;Pharyngeal residue - valleculae;Reduced tongue base retraction Pharyngeal Material does not enter airway Pharyngeal- Multi-consistency -- Pharyngeal -- Pharyngeal- Pill Other (Comment) Pharyngeal -- Pharyngeal Comment --  CHL IP CERVICAL ESOPHAGEAL PHASE 07/01/2018 Cervical Esophageal Phase WFL Pudding Teaspoon -- Pudding Cup -- Honey Teaspoon -- Honey Cup -- Nectar Teaspoon -- Nectar Cup -- Nectar Straw -- Thin Teaspoon -- Thin Cup -- Thin Straw -- Puree -- Mechanical Soft -- Regular --  Multi-consistency -- Pill -- Cervical Esophageal Comment -- Chelsea E Hartness MA, CCC-SLP 07/01/2018, 1:45 PM               Cardiac Studies   Cath: 06/27/18   Prox RCA lesion is 30% stenosed.  Mid RCA-1 lesion is 20% stenosed.  Mid RCA-2 lesion is 30% stenosed.  Ost LM to Mid LM lesion is 5% stenosed.  Mid LAD lesion is 10% stenosed.  There is moderate left ventricular systolic dysfunction.   Moderate LV dysfunction with an EF estimated 40% with diffuse hypocontractility.  LVEDP 13 mm.  Mild nonobstructive CAD with smooth 5% narrowing in a long large left main coronary artery which trifurcated into a large LAD, ramus intermediate, and left circumflex vessel.  There was mild 10% smooth narrowing in  the distal LAD.    Large dominant RCA with 3020 and 30% proximal to mid and mid distal narrowings.  RECOMMENDATION: Medical therapy for nonischemic cardiomyopathy.  Present the patient continues to be on epinephrine, Neo-Synephrine, amiodarone.  Plan dialysis as scheduled tomorrow.    ECHO IMPRESSIONS04/04/2018  1. The left ventricle has normal systolic function, with an ejection fraction of 60-65%. The cavity size was normal. There is mildly increased left ventricular wall thickness. No evidence of left ventricular regional wall motion abnormalities.  2. The tricuspid valve was grossly normal. Tricuspid valve regurgitation is mild-moderate.  3. The aortic valve is tricuspid.  Patient Profile     68 y.o. male admitted with fever and sob, subsequently develoed PMVT in the setting of long QT in the setting of QT prolonging drugs.   Assessment & Plan   1. PMVT:  Continue BB  2. Prolonged QT:  - has had before, worsened by azithromycin and (?) amio - avoid QT prolonging drugs - QT has been trending up since 12/2017 - continue to follow, keep Mg >  2.0, K+ per HD - metoprolol increased to 50 mg bid starting today - can cut back the amlodipine prn to get the BB on board  3.  Respiratory failure 2/2 CAP:  - Extubated 04/04, COVID neg - CIR pending  4. ESRD on HD: per renal team   For questions or updates, please contact [place] Please consult [email or web address] for contact info under     Signed, Rosaria Ferries, PA-C  07/03/2018, 10:06 AM     Patient seen and examined. Agree with assessment and plan.  Currently undergoing dialysis.  Recurrent chest pain.  No shortness of breath.  ECG today personally reviewed by me shows sinus rhythm at 88 with an isolated PAC.  QTc interval continues to be prolonged but continues to improve now at 614 ms.  He continues to have T wave inversion V2 through V6.  Tolerating increased dose of metoprolol 50 twice daily.  Not yet steady state.  Resting pulse still in the 90s.  Normal LV function on echo Doppler evaluation.  Blood pressure significantly elevated prior to dialysis.  Await final blood pressure reading.   Troy Sine, MD, [facility] 07/03/2018 12:08 PM

## 2018-07-03 NOTE — Progress Notes (Signed)
Inpatient Rehabilitation-Admissions Coordinator   Spoke with pt and his wife via phone. Both adamantly refuse CIR. Pt's wife feels she can manage his needs at home, despite [facility] discussing current functional level and recommendation for assistance. AC has communicated with both PT and CM regarding pt/wife wishes.   AC will sign off.   Please call if questions.   Jhonnie Garner, OTR/L  Rehab Admissions Coordinator  [phone] 07/03/2018 2:19 PM

## 2018-07-03 NOTE — Progress Notes (Signed)
Patient with emesis x1.  Vitals obtained, BP 183/72,  $Remove'10mg'VBDFxxM$  IV push Hydralazine given per PRN order.  EKG obtained, QTc 614.  [facility] text paged with this information.

## 2018-07-03 NOTE — Progress Notes (Signed)
[facility] ROUNDING NOTE   Subjective:   This is a 68 year old gentleman with a history of community-acquired pneumonia and sepsis and pulmonary infiltrates.  He required intubation.  He also had a V. tach cardiac arrest with CPR lasted about 5 minutes and IV amiodarone 06/27/2018.  He was intubated and extubated on 06/30/2018.  He received CVVHD 06/23/2020 06/26/2018.  His next dialysis session is planned for Tuesday for 07/03/2018.  Noted increased blood pressure with nausea vomiting  Blood pressure 180/68 pulse 88 temperature 98.7 O2 sats 98 room air  Dialysis 06/30/2018   1.6 L removed  Amlodipine 10 mg daily, aspirin 81 mg daily metoprolol 25 mg twice daily levothyroxine 50 mcg daily Protonix 40 mg daily Aranesp 150 mcg every 2 weeks, IV Nulicit load  Sodium 161 potassium 3.4 chloride 103 CO2 23 BUN 29 creatinine 4.95 glucose 130 calcium 7.8, phosphorus 3.9 albumin 2.3.  WBC 4.7 hemoglobin 9.2 platelets 221  Chest x-ray showed improving pulmonary edema.    Objective:  Vital signs in last 24 hours:  Temp:  [98.2 F (36.8 C)-98.7 F (37.1 C)] 98.7 F (37.1 C) (04/07 0312) Pulse Rate:  [82-89] 88 (04/07 0537) Resp:  [23-25] 25 (04/07 0312) BP: (161-185)/(66-72) 180/68 (04/07 0537) SpO2:  [95 %-98 %] 98 % (04/07 0312) Weight:  [70.6 kg] 70.6 kg (04/07 0621)  Weight change: -1.703 kg Filed Weights   07/01/18 1225 07/02/18 0311 07/03/18 0621  Weight: 72.3 kg 72.9 kg 70.6 kg    Intake/Output: I/O last 3 completed shifts: In: 096 [P.O.:640; I.V.:6] Out: -    Intake/Output this shift:  No intake/output data recorded. Awake alert oriented no distress CVS- RRR RS- CTA no wheezes or rales ABD- BS present soft non-distended EXT- no edema AV fistula positive bruit  [facility]  4 hours 76.5 kg   2/2.25 bath   no heparin   right upper arm AV fistula 400/800 Micera 200 mcg every 2 weeks Venofer 50 mg weekly    Basic Metabolic Panel: Recent Labs  Lab  06/27/18 0437  06/27/18 1102 06/28/18 0459 06/29/18 0518 06/30/18 0320 07/02/18 0313  NA 139   < > 143 143 143 138 139  K 4.1   < > 3.6 4.1 3.3* 3.6 3.4*  CL 103  --   --  107 109 102 103  CO2 15*  --   --  17* 21* 24 23  GLUCOSE 157*  --   --  151* 177* 234* 130*  BUN 26*  --   --  34* 49* 34* 29*  CREATININE 4.26*  --   --  6.09* 7.44* 4.23* 4.95*  CALCIUM 8.1*  --   --  7.7* 7.6* 7.4* 7.8*  MG 2.6*  --   --  2.5* 2.4 1.9 2.0  PHOS 3.9  --   --  4.7*  --  3.1 3.9   < > = values in this interval not displayed.    Liver Function Tests: Recent Labs  Lab 06/30/18 0320 07/02/18 0313  ALBUMIN 2.1* 2.3*   No results for input(s): LIPASE, AMYLASE in the last 168 hours. No results for input(s): AMMONIA in the last 168 hours.  CBC: Recent Labs  Lab 06/28/18 0459 06/29/18 0518 06/30/18 0320 07/01/18 0227 07/02/18 0313 07/03/18 0649  WBC 10.1 5.4 5.9 5.8 4.7 4.7  NEUTROABS 7.7  --   --   --   --   --   HGB 9.0* 8.5* 8.3* 9.3* 9.1* 9.2*  HCT 30.4*  28.7* 27.4* 30.1* 28.8* 29.7*  MCV 100.0 98.3 96.1 95.6 92.9 94.0  PLT 158 123* 119* 133* 179 221    Cardiac Enzymes: Recent Labs  Lab 06/26/18 2153 06/27/18 0437 06/27/18 1012  TROPONINI 2.05* 2.65* 2.75*    BNP: Invalid input(s): POCBNP  CBG: Recent Labs  Lab 07/02/18 0523 07/02/18 0738 07/02/18 1119 07/02/18 1646 07/02/18 2109  GLUCAP 136* 119* 163* 249* 128*    Microbiology: Results for orders placed or performed during the hospital encounter of 06/24/18  MRSA PCR Screening     Status: None   Collection Time: 06/24/18  4:12 AM  Result Value Ref Range Status   MRSA by PCR NEGATIVE NEGATIVE Final    Comment:        The GeneXpert MRSA Assay (FDA approved for NASAL specimens only), is one component of a comprehensive MRSA colonization surveillance program. It is not intended to diagnose MRSA infection nor to guide or monitor treatment for MRSA infections. Performed at [facility], [place] [address], [place], [place] [address]   Culture, blood (routine x 2) Call MD if unable to obtain prior to antibiotics being given     Status: None   Collection Time: 06/24/18  6:39 AM  Result Value Ref Range Status   Specimen Description BLOOD A-LINE DRAW  Final   Special Requests   Final    BOTTLES DRAWN AEROBIC AND ANAEROBIC Blood Culture adequate volume   Culture   Final    NO GROWTH 5 DAYS Performed at [facility], [place] [address] [place], [place] [address]    Report Status 06/29/2018 FINAL  Final  Culture, respiratory (non-expectorated)     Status: None   Collection Time: 06/24/18  7:35 AM  Result Value Ref Range Status   Specimen Description TRACHEAL ASPIRATE  Final   Special Requests NONE  Final   Gram Stain   Final    MODERATE WBC PRESENT, PREDOMINANTLY PMN RARE BUDDING YEAST SEEN Performed at [facility], [place] [address], [place], [place] [address]    Culture ABUNDANT CANDIDA [place]  Final   Report Status 06/27/2018 FINAL  Final  Culture, blood (routine x 2)     Status: None   Collection Time: 06/24/18  8:01 AM  Result Value Ref Range Status   Specimen Description BLOOD CENTRAL LINE  Final   Special Requests   Final    BOTTLES DRAWN AEROBIC AND ANAEROBIC Blood Culture adequate volume   Culture   Final    NO GROWTH 5 DAYS Performed at [facility], [place] [address], [place],  [address]    Report Status 06/29/2018 FINAL  Final    Coagulation Studies: No results for input(s): LABPROT, INR in the last 72 hours.  Urinalysis: No results for input(s): COLORURINE, LABSPEC, PHURINE, GLUCOSEU, HGBUR, BILIRUBINUR, KETONESUR, PROTEINUR, UROBILINOGEN, NITRITE, LEUKOCYTESUR in the last 72 hours.  Invalid input(s): APPERANCEUR    Imaging: Dg Swallowing Func-speech Pathology  Result Date: 07/01/2018 Objective Swallowing Evaluation: Type of Study: MBS-Modified Barium Swallow Study  Patient Details Name: TRENELL CONCANNON MRN: [id number] Date of Birth:  07/03/1950 Today's Date: 07/01/2018 Time: SLP Start Time (ACUTE ONLY): 1315 -SLP Stop Time (ACUTE ONLY): 1330 SLP Time Calculation (min) (ACUTE ONLY): 15 min Past Medical History: Past Medical History: Diagnosis Date . Anemia  . Arthritis   per pt (11/21/2017) . CHF (congestive heart failure) ([place]) ~ 2017 . CKD stage 3 due to type 2 diabetes mellitus ([place])  stage 5 Dialysis T/Th/Sa . CVA (cerebral vascular accident) [facility]) 2007  wife denies residual on 11/21/2017 . Diabetic peripheral neuropathy ([place])  . Dyspnea  . ESRD (end stage renal disease) on dialysis ([place])   "[facility]; TTS" (11/21/2017) . GERD (gastroesophageal reflux disease)   has in the past . Headache   since going on dialysis . Hypertension  . Hypothyroidism  . PONV (postoperative nausea and vomiting)  . Restless legs  . Type II diabetes mellitus ([place])  Past Surgical History: Past Surgical History: Procedure Laterality Date . AV FISTULA PLACEMENT Right 10/07/2016  Procedure: RIGHT BRACHIOCEPHALIC ARTERIOVENOUS (AV) FISTULA CREATION;  Surgeon: Angelia Mould, MD;  Location: [facility];  Service: Vascular;  Laterality: Right; . BACK SURGERY   . CATARACT EXTRACTION W/ INTRAOCULAR LENS IMPLANT Left  . CIRCUMCISION  2015 . HERNIA REPAIR  2016  "stomach" . LEFT HEART CATH AND CORONARY ANGIOGRAPHY N/A 06/27/2018  Procedure: LEFT HEART CATH AND CORONARY ANGIOGRAPHY;  Surgeon: Troy Sine, MD;  Location: [facility];  Service: Cardiovascular;  Laterality: N/A; . POSTERIOR LUMBAR FUSION  1990s?  "has bolts and rods in there" HPI: 68 year old male with history of esrd on hd, cva, htn, chf, presented to [place] with ams.  He had fever of 101.6 at  and only responsive to deep stimuli.  Cxr showed diffuse infiltrates. Inutbated from 3/28 until 4/4. VT arrest requiring amiodarone and chest compressions on 06/27/2018, cath nonobstructive disease.  Subjective: alert, upright in chair; requires cueing Assessment / Plan / Recommendation CHL IP CLINICAL  IMPRESSIONS 07/01/2018 Clinical Impression Pt presents with a mild oropharyngeal dysphagia. Oral deficitc c/b delayed AP transit, lingual pumping, reduced bolus cohesion, and mild oral residuals. Pt unable to propel 13 mm barium tablet to hypopharynx and eventually expecorated from oral cavity. Pharyngeal deficits c/b one instance of mistimed epiglottic inversion with reduced laryngeal vestibule closure resulting in penetration of thin liquids before the swallow (on initial thin series). No aspiration evidenced, no further penetration exhibited. Recommend dysphagia 2 (fine chopped) and thin liquids with medicines as tolerated and safe swallow strategies including full supervision with all PO. SLP to follow up.  SLP Visit Diagnosis Dysphagia, oropharyngeal phase (R13.12) Attention and concentration deficit following -- Frontal lobe and executive function deficit following -- Impact on safety and function Mild aspiration risk   CHL IP TREATMENT RECOMMENDATION 07/01/2018 Treatment Recommendations Therapy as outlined in treatment plan below   Prognosis 07/01/2018 Prognosis for Safe Diet Advancement Good Barriers to Reach Goals Cognitive deficits Barriers/Prognosis Comment -- CHL IP DIET RECOMMENDATION 07/01/2018 SLP Diet Recommendations Dysphagia 2 (Fine chop) solids;Thin liquid Liquid Administration via Cup;Straw Medication Administration Whole meds with puree Compensations Minimize environmental distractions;Slow rate;Small sips/bites Postural Changes Seated upright at 90 degrees;Remain semi-upright after after feeds/meals (Comment)   CHL IP OTHER RECOMMENDATIONS 07/01/2018 Recommended Consults -- Oral Care Recommendations Oral care BID Other Recommendations --   CHL IP FOLLOW UP RECOMMENDATIONS 07/01/2018 Follow up Recommendations 24 hour supervision/assistance;Skilled Nursing facility   [facility] IP FREQUENCY AND DURATION 07/01/2018 Speech Therapy Frequency (ACUTE ONLY) min 2x/week Treatment Duration 1 week      CHL IP ORAL PHASE  07/01/2018 Oral Phase Impaired Oral - Pudding Teaspoon -- Oral - Pudding Cup -- Oral - Honey Teaspoon -- Oral - Honey Cup -- Oral - Nectar Teaspoon -- Oral - Nectar Cup -- Oral - Nectar Straw -- Oral - Thin Teaspoon -- Oral - Thin Cup -- Oral - Thin Straw -- Oral - Puree -- Oral -  Mech Soft -- Oral - Regular -- Oral - Multi-Consistency -- Oral - Pill -- Oral Phase - Comment --  CHL IP PHARYNGEAL PHASE 07/01/2018 Pharyngeal Phase Impaired Pharyngeal- Pudding Teaspoon -- Pharyngeal -- Pharyngeal- Pudding Cup -- Pharyngeal -- Pharyngeal- Honey Teaspoon -- Pharyngeal -- Pharyngeal- Honey Cup -- Pharyngeal -- Pharyngeal- Nectar Teaspoon -- Pharyngeal -- Pharyngeal- Nectar Cup Delayed swallow initiation-pyriform sinuses Pharyngeal -- Pharyngeal- Nectar Straw -- Pharyngeal -- Pharyngeal- Thin Teaspoon -- Pharyngeal -- Pharyngeal- Thin Cup Reduced epiglottic inversion;Reduced airway/laryngeal closure;Penetration/Aspiration before swallow Pharyngeal Material enters airway, remains ABOVE vocal cords and not ejected out Pharyngeal- Thin Straw Delayed swallow initiation-pyriform sinuses Pharyngeal Material does not enter airway Pharyngeal- Puree Reduced tongue base retraction;Pharyngeal residue - valleculae Pharyngeal Material does not enter airway Pharyngeal- Mechanical Soft -- Pharyngeal -- Pharyngeal- Regular Delayed swallow initiation-vallecula;Pharyngeal residue - valleculae;Reduced tongue base retraction Pharyngeal Material does not enter airway Pharyngeal- Multi-consistency -- Pharyngeal -- Pharyngeal- Pill Other (Comment) Pharyngeal -- Pharyngeal Comment --  CHL IP CERVICAL ESOPHAGEAL PHASE 07/01/2018 Cervical Esophageal Phase WFL Pudding Teaspoon -- Pudding Cup -- Honey Teaspoon -- Honey Cup -- Nectar Teaspoon -- Nectar Cup -- Nectar Straw -- Thin Teaspoon -- Thin Cup -- Thin Straw -- Puree -- Mechanical Soft -- Regular -- Multi-consistency -- Pill -- Cervical Esophageal Comment -- Chelsea E Hartness MA, CCC-SLP 07/01/2018,  1:45 PM                Medications:   . sodium chloride Stopped (07/01/18 1007)  . sodium chloride    . sodium chloride    . ferric gluconate (FERRLECIT/NULECIT) IV    . sodium chloride 250 mL (06/28/18 1511)   . amLODipine  10 mg Oral Daily  . aspirin  81 mg Per Tube Daily  . chlorhexidine  15 mL Mouth Rinse BID  . Chlorhexidine Gluconate Cloth  6 each Topical Q0600  . Chlorhexidine Gluconate Cloth  6 each Topical Q0600  . darbepoetin (ARANESP) injection - DIALYSIS  150 mcg Intravenous Q Tue-HD  . feeding supplement (PRO-STAT SUGAR FREE 64)  30 mL Oral BID  . heparin  5,000 Units Subcutaneous Q8H  . insulin aspart  0-5 Units Subcutaneous QHS  . insulin aspart  0-9 Units Subcutaneous TID WC  . levothyroxine  50 mcg Per Tube Q0600  . mouth rinse  15 mL Mouth Rinse q12n4p  . metoprolol tartrate  50 mg Per Tube BID  . multivitamin  1 tablet Oral QHS  . pantoprazole sodium  40 mg Oral Q24H  . sodium chloride flush  3 mL Intravenous Q12H   sodium chloride, sodium chloride, sodium chloride, acetaminophen, alteplase, heparin, hydrALAZINE, lidocaine (PF), lidocaine-prilocaine, pentafluoroprop-tetrafluoroeth, prochlorperazine, sodium chloride, sodium chloride flush  Assessment/ Plan:   Community acquired pneumonia.  Intubated now extubated.  COVID-19 test negative.  Receiving IV antibiotics.  It appears her last dose was 06/30/2018 of Rocephin..  Azithromycin administered 06/23/2020 -  06/28/2018 chest x-ray improved.  But decreasing oxygen requirements  VT cardiac arrest transition to IV amiodarone  Ventilator dependent respiratory failure extubated 06/30/2018  Hypertension/volume appears to be doing well will continue to challenge estimated dry weight encourage compliance with dialysis.  We will add low-dose ACE inhibitor to her regimen.  Suggest lisinopril 5 mg nightly   anemia iron low T sats 10% started IV iron loading, darbepoetin to be administered 150 mcg 07/03/2018  Bones.  No  binders at this present time  Nutrition stable at this present time we will continue to follow consider adding Protostat  and vitamins  Diabetes mellitus sliding scale  Hypothyroidism on replacement therapy  Congestive heart failure with diastolic dysfunction 2D echo performed 06/28/2018   LOS: [place] $RemoveBe'@TODAY'FTVAkUvfu$ $Remo'@7'DrgbX$ :54 AM

## 2018-07-03 NOTE — Progress Notes (Signed)
PT Cancellation Note  Patient Details Name: NASSER KU MRN: [id number] DOB: [birth year]   Cancelled Treatment:    Reason Eval/Treat Not Completed: Patient at procedure or test/unavailable (HD). Will follow-up for PT treatment as schedule permits.  Mabeline Caras, PT, DPT Acute Rehabilitation Services  Pager [phone] Office [place] 07/03/2018, 8:00 AM

## 2018-07-03 NOTE — Progress Notes (Signed)
Physical Therapy Treatment Patient Details Name: Alexander Whitaker MRN: [id number] DOB: 1951/02/12 Today's Date: 07/03/2018    History of Present Illness Pt is a 68 y.o. male admitted 06/24/18 with fever and AMS. CXR showed diffuse infiltrates. Worked up for sepsis, CAP, VDRF with hypoxia. 3/31 with NSTEMI. 4/1 afib with RVR subsequently had torsades vs VT, requiring compression and defibrillation. ETT 3/28-4/4. S/p cardiac cath showing nonobstructive disease. PMH includes ESRD on HD, stroke, HTN, CHF.   PT Comments    Pt slowly progressing with mobility. Pt with impaired cognition, including disorientation, decreased attention and very poor insight into deficits. Currently requires mod-maxA to maintain standing balance and take steps; despite this, and losing balance without assist, pt continues to state he can do everything by himself. Spoke with wife on phone who is declining CIR/SNF, will have pt return home with her assist; discussed current assist needs and DME recommendations, including hoyer lift, wife agreeable. Will continue to follow acutely.   Follow Up Recommendations  CIR;Supervision/Assistance - 24 hour(wife declined; will return home with HHPT and wife assist)     Equipment Recommendations  Rolling walker with 5" wheels;3in1 (PT);Other (comment)(hoyer lift)    Recommendations for Other Services       Precautions / Restrictions Precautions Precautions: Fall Restrictions Weight Bearing Restrictions: No    Mobility  Bed Mobility Overal bed mobility: Needs Assistance Bed Mobility: Supine to Sit     Supine to sit: Mod assist     General bed mobility comments: Increased time to get pt to focus and to convince him to sit up; modA for UE support to assist trunk elevation  Transfers Overall transfer level: Needs assistance Equipment used: 1 person hand held assist Transfers: Sit to/from Stand Sit to Stand: Mod assist         General transfer comment: ModA to assist  trunk elevation and prevent posterior LOB; pt holding onto RW with L hand but would not keep hold with R despite cues. Stating "I can hop up like a bunny" but when assist not provided, pt falling back to sitting on bed  Ambulation/Gait Ambulation/Gait assistance: Mod assist Gait Distance (Feet): 3 Feet Assistive device: 1 person hand held assist Gait Pattern/deviations: Shuffle;Trunk flexed;Leaning posteriorly Gait velocity: Decreased   General Gait Details: Short, shuffling steps from bed to recliner requiring mod-maxA to prevent posterior LOB; pt holding onto RW with L hand but not safely despite max cues. Poor awareness, max/multimodal cues for safety but not consistently following commands   Stairs             Wheelchair Mobility    Modified Rankin (Stroke Patients Only)       Balance Overall balance assessment: Needs assistance   Sitting balance-Leahy Scale: Fair       Standing balance-Leahy Scale: Poor                              Cognition Arousal/Alertness: Awake/alert Behavior During Therapy: WFL for tasks assessed/performed;Impulsive Overall Cognitive Status: Impaired/Different from baseline Area of Impairment: Orientation;Memory;Following commands;Safety/judgement;Awareness;Problem solving                 Orientation Level: Disoriented to;Time;Situation;Place   Memory: Decreased short-term memory Following Commands: Follows one step commands inconsistently;Follows one step commands with increased time Safety/Judgement: Decreased awareness of safety;Decreased awareness of deficits Awareness: Intellectual Problem Solving: Decreased initiation;Slow processing;Requires verbal cues General Comments: Poor attention, easily distracted. When I walked into room pt  stating, "I thought you were Mrs. Pablo" becoming emotional when wife called at end of session. Difficult getting baseline cognition info from wife; wife reports "he has some trouble  remembering year but knows where he is and his birthday... he normally takes a medicine to help anxiety which makes him more normal to talk to"      Exercises      General Comments General comments (skin integrity, edema, etc.): Very poor awareness of deficits continuing to state he can do it all by himself despite showing pt otherwise. Spoke with wife on phone regarding d/c plans as wife declined CIR/SNF, pt will return home with HHPT and her assist; wife agreeable to RW, [facility] and hoyer lift      Pertinent Vitals/Pain Pain Assessment: Faces Faces Pain Scale: No hurt    Home Living                      Prior Function            PT Goals (current goals can now be found in the care plan section) Acute Rehab PT Goals Patient Stated Goal: go home with [name] PT Goal Formulation: With patient/family Time For Goal Achievement: 07/15/18 Potential to Achieve Goals: Good Progress towards PT goals: Progressing toward goals    Frequency    Min 3X/week      PT Plan Current plan remains appropriate    Co-evaluation              AM-PAC PT "6 Clicks" Mobility   Outcome Measure  Help needed turning from your back to your side while in a flat bed without using bedrails?: A Lot Help needed moving from lying on your back to sitting on the side of a flat bed without using bedrails?: A Lot Help needed moving to and from a bed to a chair (including a wheelchair)?: A Lot Help needed standing up from a chair using your arms (e.g., wheelchair or bedside chair)?: A Lot Help needed to walk in hospital room?: A Lot Help needed climbing 3-5 steps with a railing? : Total 6 Click Score: 11    End of Session   Activity Tolerance: Patient tolerated treatment well;Patient limited by fatigue Patient left: in chair;with call bell/phone within reach;with chair alarm set Nurse Communication: Mobility status PT Visit Diagnosis: Unsteadiness on feet (R26.81);Other abnormalities  of gait and mobility (R26.89)     Time: 1779-3903 PT Time Calculation (min) (ACUTE ONLY): 24 min  Charges:  $Therapeutic Activity: 8-22 mins $Self Care/Home Management: 8-22                    Mabeline Caras, PT, DPT Acute Rehabilitation Services  Pager [phone] Office [place] 07/03/2018, 3:35 PM

## 2018-07-03 NOTE — Progress Notes (Signed)
PROGRESS NOTE    Alexander Whitaker   IRS:[id number]  DOB: 11-Jul-1950  DOA: 06/24/2018 PCP: [name], MD   Brief Narrative:  Alexander Whitaker 68 y/o with ESRD on HD, CVA, HTN, CHF sent from [facility] for fever 101.6 and obtunded state for which he was intubated.  CXR showed diffuse infiltrates- started on Vanc, Ceftriaxone and Zithro. Blood sugar of 638, HONK, started on Insulin infusion. Hypotensive, started on Levophed.   Admitted to ICU team.   Femoral aline 06/24/18 RIJ 06/24/18  3/31-NSTEMI-  Troponin 2.75- Cardiology consulted 4/1- A-fib with RVR started on Amio infusion- subsequently had ? Torsades vs VT- compression, defib x 1> ROSC 4/2- EP consulted- suspected QT (which was already prolonged) became more prolonged with Amio, Azithromycin and abnormal electrolytes 4/1- Cardiac cath > no significant ischemia 4/2 Limited ECHO- normal EF, no diastolic dysfunction- mild to mod TR 4/4 Extubated  Subjective: Seen while being dialyzed- he has no complaints.   Assessment & Plan:   Principal Problem:   Sepsis- CAP-  VDRF with hypoxia  - Ruled out for COVID - completed Ceftriaxone for CAP - weaned to room air  Active Problems:   Non-ST elevation (NSTEMI) myocardial infarction - Cath showing normal coronaries- see above    Cardiac arrest with ventricular fibrillation  - avoid QT prolonging meds- ensure electrolytes are normal - QT still quite long but K is only 3.3 today- expect he will receive K in dialysate - will recheck K at 6 PM and replace if still low - Mg normal today    Acute diastolic congestive heart failure - CXR 4/3> Improving pulmonary edema pattern. Mild residual perihilar/infrahilar opacities. - fluid management with dialysis  Dysphagia oropharyngeal phase after intubation - now on D 2 diet after SLP eval    Diabetes mellitus, type 2  - uses Levemir 10 U at home - currently on SSI    ESRD (end stage renal disease) - dialysis per nephrology   Anemia due to chronic disease - appreciate renal management of this    Essential hypertension - on Lopressor, Amlodipine  Hypothyroidism -cont Synthroid   GERD - on Protonix daily at home and here     Time spent in minutes: 35 DVT prophylaxis: Heparin Code Status: Full code Family Communication:  Disposition Plan: refuses CIR, wife states she can take care of him at home- can d/c once cleared by cardiology and when K > 4 Consultants:   Admitted by [facility]  Cardiology  EP  Nephrology Procedures:  1. The left ventricle has normal systolic function, with an ejection fraction of 60-65%. The cavity size was normal. There is mildly increased left ventricular wall thickness. No evidence of left ventricular regional wall motion abnormalities.  2. The tricuspid valve was grossly normal. Tricuspid valve regurgitation is mild-moderate.  3. The aortic valve is tricuspid.  Antimicrobials:  Anti-infectives (From admission, onward)   Start     Dose/Rate Route Frequency Ordered Stop   06/28/18 1000  azithromycin (ZITHROMAX) 500 mg in sodium chloride 0.9 % 250 mL IVPB  Status:  Discontinued     500 mg 250 mL/hr over 60 Minutes Intravenous Every 24 hours 06/27/18 1007 06/28/18 0749   06/26/18 1200  vancomycin (VANCOCIN) IVPB 750 mg/150 ml premix  Status:  Discontinued     750 mg 150 mL/hr over 60 Minutes Intravenous Every T-Th-Sa (Hemodialysis) 06/24/18 0527 06/24/18 1316   06/24/18 2200  cefTRIAXone (ROCEPHIN) 1 g in sodium chloride 0.9 % 100 mL IVPB  1 g 200 mL/hr over 30 Minutes Intravenous Every 24 hours 06/24/18 0428 06/30/18 2153   06/24/18 2200  vancomycin (VANCOCIN) IVPB 750 mg/150 ml premix  Status:  Discontinued     750 mg 150 mL/hr over 60 Minutes Intravenous Every 24 hours 06/24/18 1316 06/26/18 0955   06/24/18 0500  azithromycin (ZITHROMAX) 500 mg in sodium chloride 0.9 % 250 mL IVPB  Status:  Discontinued     500 mg 250 mL/hr over 60 Minutes Intravenous Every 24  hours 06/24/18 0428 06/27/18 1007       Objective: Vitals:   07/03/18 1129 07/03/18 1200 07/03/18 1220 07/03/18 1311  BP: 139/65 (!) 146/73 (!) 170/86 (!) 163/55  Pulse: 91 95 86 86  Resp:   16   Temp:   99.3 F (37.4 C)   TempSrc:   Oral   SpO2:   96% 100%  Weight:   66.9 kg   Height:        Intake/Output Summary (Last 24 hours) at 07/03/2018 1419 Last data filed at 07/03/2018 1314 Gross per 24 hour  Intake 346 ml  Output 3000 ml  Net -2654 ml   Filed Weights   07/03/18 0621 07/03/18 0800 07/03/18 1220  Weight: 70.6 kg 70.4 kg 66.9 kg    Examination: General exam: Appears comfortable  HEENT: PERRLA, oral mucosa moist, no sclera icterus or thrush Respiratory system: Clear to auscultation. Respiratory effort normal. Cardiovascular system: S1 & S2 heard,  No murmurs  Gastrointestinal system: Abdomen soft, non-tender, nondistended. Normal bowel sounds   Central nervous system: Alert and oriented. No focal neurological deficits. Extremities: No cyanosis, clubbing or edema Skin: No rashes or ulcers Psychiatry:  Mood & affect appropriate.    Data Reviewed: I have personally reviewed following labs and imaging studies  CBC: Recent Labs  Lab 06/28/18 0459 06/29/18 0518 06/30/18 0320 07/01/18 0227 07/02/18 0313 07/03/18 0649  WBC 10.1 5.4 5.9 5.8 4.7 4.7  NEUTROABS 7.7  --   --   --   --   --   HGB 9.0* 8.5* 8.3* 9.3* 9.1* 9.2*  HCT 30.4* 28.7* 27.4* 30.1* 28.8* 29.7*  MCV 100.0 98.3 96.1 95.6 92.9 94.0  PLT 158 123* 119* 133* 179 409   Basic Metabolic Panel: Recent Labs  Lab 06/27/18 0437  06/28/18 0459 06/29/18 0518 06/30/18 0320 07/02/18 0313 07/03/18 0649  NA 139   < > 143 143 138 139 142  K 4.1   < > 4.1 3.3* 3.6 3.4* 3.3*  CL 103  --  107 109 102 103 104  CO2 15*  --  17* 21* 24 23 21*  GLUCOSE 157*  --  151* 177* 234* 130* 190*  BUN 26*  --  34* 49* 34* 29* 38*  CREATININE 4.26*  --  6.09* 7.44* 4.23* 4.95* 6.30*  CALCIUM 8.1*  --  7.7* 7.6* 7.4*  7.8* 7.6*  MG 2.6*  --  2.5* 2.4 1.9 2.0  --   PHOS 3.9  --  4.7*  --  3.1 3.9 4.7*   < > = values in this interval not displayed.   GFR: Estimated Creatinine Clearance: 9.6 mL/min (A) (by C-G formula based on SCr of 6.3 mg/dL (H)). Liver Function Tests: Recent Labs  Lab 06/30/18 0320 07/02/18 0313 07/03/18 0649  ALBUMIN 2.1* 2.3* 2.4*   No results for input(s): LIPASE, AMYLASE in the last 168 hours. No results for input(s): AMMONIA in the last 168 hours. Coagulation Profile: No results for input(s): INR, PROTIME  in the last 168 hours. Cardiac Enzymes: Recent Labs  Lab 06/26/18 2153 06/27/18 0437 06/27/18 1012  TROPONINI 2.05* 2.65* 2.75*   BNP (last 3 results) No results for input(s): PROBNP in the last 8760 hours. HbA1C: No results for input(s): HGBA1C in the last 72 hours. CBG: Recent Labs  Lab 07/02/18 1119 07/02/18 1646 07/02/18 2109 07/03/18 0908 07/03/18 1311  GLUCAP 163* 249* 128* 145* 139*   Lipid Profile: No results for input(s): CHOL, HDL, LDLCALC, TRIG, CHOLHDL, LDLDIRECT in the last 72 hours. Thyroid Function Tests: No results for input(s): TSH, T4TOTAL, FREET4, T3FREE, THYROIDAB in the last 72 hours. Anemia Panel: Recent Labs    06/30/18 1552  FERRITIN 1,274*  TIBC 125*  IRON 13*   Urine analysis:    Component Value Date/Time   COLORURINE STRAW (A) 03/10/2016 1905   APPEARANCEUR CLEAR 03/10/2016 1905   LABSPEC 1.011 03/10/2016 1905   PHURINE 6.0 03/10/2016 1905   GLUCOSEU >=500 (A) 03/10/2016 1905   HGBUR SMALL (A) 03/10/2016 1905   BILIRUBINUR NEGATIVE 03/10/2016 [place] 03/10/2016 1905   PROTEINUR >=300 (A) 03/10/2016 1905   NITRITE NEGATIVE 03/10/2016 1905   LEUKOCYTESUR NEGATIVE 03/10/2016 1905   Sepsis Labs: $RemoveBefo'@[id number]'[id number]$ (procalcitonin:4,lacticidven:4) ) Recent Results (from the past 240 hour(s))  MRSA PCR Screening     Status: None   Collection Time: 06/24/18  4:12 AM  Result Value Ref Range Status   MRSA by  PCR NEGATIVE NEGATIVE Final    Comment:        The GeneXpert MRSA Assay (FDA approved for NASAL specimens only), is one component of a comprehensive MRSA colonization surveillance program. It is not intended to diagnose MRSA infection nor to guide or monitor treatment for MRSA infections. Performed at [facility], [place] [address] [place], [place] [address]   Culture, blood (routine x 2) Call MD if unable to obtain prior to antibiotics being given     Status: None   Collection Time: 06/24/18  6:39 AM  Result Value Ref Range Status   Specimen Description BLOOD A-LINE DRAW  Final   Special Requests   Final    BOTTLES DRAWN AEROBIC AND ANAEROBIC Blood Culture adequate volume   Culture   Final    NO GROWTH 5 DAYS Performed at [facility], [place] [address] [place], [place] [address]    Report Status 06/29/2018 FINAL  Final  Culture, respiratory (non-expectorated)     Status: None   Collection Time: 06/24/18  7:35 AM  Result Value Ref Range Status   Specimen Description TRACHEAL ASPIRATE  Final   Special Requests NONE  Final   Gram Stain   Final    MODERATE WBC PRESENT, PREDOMINANTLY PMN RARE BUDDING YEAST SEEN Performed at [facility], [place] [address], [place], [place] [address]    Culture ABUNDANT CANDIDA [place]  Final   Report Status 06/27/2018 FINAL  Final  Culture, blood (routine x 2)     Status: None   Collection Time: 06/24/18  8:01 AM  Result Value Ref Range Status   Specimen Description BLOOD CENTRAL LINE  Final   Special Requests   Final    BOTTLES DRAWN AEROBIC AND ANAEROBIC Blood Culture adequate volume   Culture   Final    NO GROWTH 5 DAYS Performed at [facility], [place] [address], [place], [place] [address]    Report Status 06/29/2018 FINAL  Final         Radiology Studies:  No results found.    Scheduled Meds: . amLODipine  10 mg Oral Daily  . aspirin  81 mg Per Tube Daily  . chlorhexidine  15 mL Mouth Rinse BID  .  Chlorhexidine Gluconate Cloth  6 each Topical Q0600  . Chlorhexidine Gluconate Cloth  6 each Topical Q0600  . darbepoetin (ARANESP) injection - DIALYSIS  150 mcg Intravenous Q Tue-HD  . feeding supplement (PRO-STAT SUGAR FREE 64)  30 mL Oral BID  . heparin  5,000 Units Subcutaneous Q8H  . insulin aspart  0-5 Units Subcutaneous QHS  . insulin aspart  0-9 Units Subcutaneous TID WC  . levothyroxine  50 mcg Per Tube Q0600  . lisinopril  5 mg Oral Daily  . mouth rinse  15 mL Mouth Rinse q12n4p  . metoprolol tartrate  50 mg Per Tube BID  . multivitamin  1 tablet Oral QHS  . [START ON 07/04/2018] pantoprazole  40 mg Oral Daily  . pantoprazole sodium  40 mg Oral Q24H  . sodium chloride flush  3 mL Intravenous Q12H   Continuous Infusions: . sodium chloride Stopped (07/01/18 1007)  . ferric gluconate (FERRLECIT/NULECIT) IV Stopped (07/03/18 1257)  . sodium chloride 250 mL (06/28/18 1511)     LOS: 9 days      Debbe Odea, MD [facility] Pager: [email or web address] Password [email or web address] 07/03/2018, 2:19 PM

## 2018-07-03 NOTE — Progress Notes (Signed)
Nutrition Follow-up  DOCUMENTATION CODES:   Not applicable  INTERVENTION:    Nepro Shake po BID, each supplement provides 425 kcal and 19 grams protein  NUTRITION DIAGNOSIS:   Inadequate oral intake related to decreased appetite, dysphagia as evidenced by meal completion < 50%.  Ongoing  GOAL:   Patient will meet greater than or equal to 90% of their needs  Progressing   MONITOR:   PO intake, Supplement acceptance, Labs  REASON FOR ASSESSMENT:   Ventilator    ASSESSMENT:   68 yo male with PMH of HTN, dyspnea, ESRD on HD, CHF, DM-2, CVA who was admitted from [facility] with AMS, respiratory failure requiring intubation.  Patient S/P HD today.  Patient was extubated on 4/4. S/P MBS with SLP 4/5. Diet advanced to dysphagia 2 with thin liquids. Patient is consuming  25-50% of meals. Would benefit from a PO supplement to maximize protein and calorie intake.   Labs reviewed. Potassium 3.3 (L), phosphorus 4.7 (H) CBG's: 145-139  Medications reviewed and include Novolog, Rena-vit, ferric gluconate, Aranesp.    NUTRITION - FOCUSED PHYSICAL EXAM:  unable to complete  Diet Order:   Diet Order            DIET DYS 2 Room service appropriate? Yes; Fluid consistency: Thin  Diet effective now              EDUCATION NEEDS:   No education needs have been identified at this time  Skin:  Skin Assessment: Reviewed RN Assessment  Last BM:  4/1 (type 7)  Height:   Ht Readings from Last 1 Encounters:  07/01/18 $RemoveB'5\' 5"'oCwLJppd$  (1.651 m)    Weight:   Wt Readings from Last 1 Encounters:  07/03/18 66.9 kg    Ideal Body Weight:  61.8 kg  BMI:  Body mass index is 24.54 kg/m.  Estimated Nutritional Needs:   Kcal:  3202-3343  Protein:  90-110 gm  Fluid:  1 L + UOP    Molli Barrows, RD, LDN, [place] Pager [phone] After Hours Pager [phone]

## 2018-07-04 DIAGNOSIS — I472 Ventricular tachycardia: Secondary | ICD-10-CM

## 2018-07-04 DIAGNOSIS — I4729 Other ventricular tachycardia: Secondary | ICD-10-CM

## 2018-07-04 LAB — CBC
HCT: 35.7 % — ABNORMAL LOW (ref 39.0–52.0)
Hemoglobin: 11.4 g/dL — ABNORMAL LOW (ref 13.0–17.0)
MCH: 30.2 pg (ref 26.0–34.0)
MCHC: 31.9 g/dL (ref 30.0–36.0)
MCV: 94.7 fL (ref 80.0–100.0)
Platelets: 332 10*3/uL (ref 150–400)
RBC: 3.77 MIL/uL — ABNORMAL LOW (ref 4.22–5.81)
RDW: 14.4 % (ref 11.5–15.5)
WBC: 8.4 10*3/uL (ref 4.0–10.5)
nRBC: 0 % (ref 0.0–0.2)

## 2018-07-04 LAB — GLUCOSE, CAPILLARY
Glucose-Capillary: 222 mg/dL — ABNORMAL HIGH (ref 70–99)
Glucose-Capillary: 227 mg/dL — ABNORMAL HIGH (ref 70–99)
Glucose-Capillary: 186 mg/dL — ABNORMAL HIGH (ref 70–99)

## 2018-07-04 LAB — BASIC METABOLIC PANEL
Anion gap: 15 (ref 5–15)
BUN: 20 mg/dL (ref 8–23)
CO2: 23 mmol/L (ref 22–32)
Calcium: 8.5 mg/dL — ABNORMAL LOW (ref 8.9–10.3)
Chloride: 100 mmol/L (ref 98–111)
Creatinine, Ser: 4.05 mg/dL — ABNORMAL HIGH (ref 0.61–1.24)
GFR calc Af Amer: 16 mL/min — ABNORMAL LOW (ref >60)
GFR calc non Af Amer: 14 mL/min — ABNORMAL LOW (ref >60)
Glucose, Bld: 217 mg/dL — ABNORMAL HIGH (ref 70–99)
Potassium: 3.7 mmol/L (ref 3.5–5.1)
Sodium: 138 mmol/L (ref 135–145)

## 2018-07-04 LAB — MAGNESIUM
Magnesium: 2 mg/dL (ref 1.7–2.4)
Magnesium: 2.4 mg/dL (ref 1.7–2.4)

## 2018-07-04 LAB — POTASSIUM: Potassium: 3.7 mmol/L (ref 3.5–5.1)

## 2018-07-04 LAB — MRSA PCR SCREENING: MRSA by PCR: NEGATIVE

## 2018-07-04 LAB — LIDOCAINE LEVEL: Lidocaine Lvl: 2 ug/mL (ref 1.5–5.0)

## 2018-07-04 MED ORDER — LIDOCAINE IN D5W 4-5 MG/ML-% IV SOLN
1.0000 mg/min | INTRAVENOUS | Status: DC
  Administered 2018-07-04: 03:00:00 1 mg/min via INTRAVENOUS
  Filled 2018-07-04: qty 500

## 2018-07-04 MED ORDER — MAGNESIUM SULFATE 2 GM/50ML IV SOLN
2.0000 g | Freq: Once | INTRAVENOUS | Status: AC
  Administered 2018-07-04: 02:00:00 2 g via INTRAVENOUS
  Filled 2018-07-04: qty 50

## 2018-07-04 MED ORDER — POTASSIUM CHLORIDE 10 MEQ/100ML IV SOLN
10.0000 meq | INTRAVENOUS | Status: AC
  Administered 2018-07-04 (×2): 10 meq via INTRAVENOUS
  Filled 2018-07-04 (×2): qty 100

## 2018-07-04 MED ORDER — ORAL CARE MOUTH RINSE
15.0000 mL | Freq: Two times a day (BID) | OROMUCOSAL | Status: DC
  Administered 2018-07-05 – 2018-07-08 (×3): 15 mL via OROMUCOSAL

## 2018-07-04 MED ORDER — METOPROLOL TARTRATE 50 MG PO TABS
50.0000 mg | ORAL_TABLET | Freq: Two times a day (BID) | ORAL | Status: DC
  Administered 2018-07-04 (×2): 50 mg via ORAL
  Filled 2018-07-04 (×2): qty 1

## 2018-07-04 MED ORDER — CHLORHEXIDINE GLUCONATE CLOTH 2 % EX PADS
6.0000 | MEDICATED_PAD | Freq: Every day | CUTANEOUS | Status: DC
  Administered 2018-07-05 – 2018-07-07 (×3): 6 via TOPICAL

## 2018-07-04 MED ORDER — ASPIRIN 81 MG PO CHEW
81.0000 mg | CHEWABLE_TABLET | Freq: Every day | ORAL | Status: DC
  Administered 2018-07-04 – 2018-07-09 (×6): 81 mg via ORAL
  Filled 2018-07-04 (×6): qty 1

## 2018-07-04 MED ORDER — LIDOCAINE BOLUS VIA INFUSION
75.0000 mg | Freq: Once | INTRAVENOUS | Status: AC
  Administered 2018-07-04: 75 mg via INTRAVENOUS
  Filled 2018-07-04: qty 76

## 2018-07-04 MED ORDER — POTASSIUM CHLORIDE CRYS ER 20 MEQ PO TBCR
20.0000 meq | EXTENDED_RELEASE_TABLET | Freq: Once | ORAL | Status: DC
  Filled 2018-07-04: qty 1

## 2018-07-04 NOTE — Progress Notes (Signed)
MEDICATION RELATED CONSULT NOTE - INITIAL   Pharmacy Consult for QTc-prolonging medication review Indication: Torsades arrest  No Known Allergies  Patient Measurements: Height: 5\' 5"  (165.1 cm) Weight: 147 lb 7.8 oz (66.9 kg) IBW/kg (Calculated) : 61.5  Vital Signs: Temp: 98.2 F (36.8 C) (04/08 1119) Temp Source: Oral (04/08 1119) BP: 143/49 (04/08 1900) Pulse Rate: 68 (04/08 1900) Intake/Output from previous day: 04/07 0701 - 04/08 0700 In: 296.2 [I.V.:94.2; IV Piggyback:202] Out: 3000  Intake/Output from this shift: No intake/output data recorded.  Labs: Recent Labs    07/02/18 0313 07/03/18 0649 07/03/18 1650 07/04/18 0204  WBC 4.7 4.7  --  8.4  HGB 9.1* 9.2*  --  11.4*  HCT 28.8* 29.7*  --  35.7*  PLT 179 221  --  332  CREATININE 4.95* 6.30* 3.05* 4.05*  MG 2.0  --   --  2.0  PHOS 3.9 4.7*  --   --   ALBUMIN 2.3* 2.4*  --   --    Estimated Creatinine Clearance: 15 mL/min (A) (by C-G formula based on SCr of 4.05 mg/dL (H)).   Microbiology: Recent Results (from the past 720 hour(s))  MRSA PCR Screening     Status: None   Collection Time: 06/24/18  4:12 AM  Result Value Ref Range Status   MRSA by PCR NEGATIVE NEGATIVE Final    Comment:        The GeneXpert MRSA Assay (FDA approved for NASAL specimens only), is one component of a comprehensive MRSA colonization surveillance program. It is not intended to diagnose MRSA infection nor to guide or monitor treatment for MRSA infections. Performed at [facility], [place] [address] [place], [place] [address]   Culture, blood (routine x 2) Call MD if unable to obtain prior to antibiotics being given     Status: None   Collection Time: 06/24/18  6:39 AM  Result Value Ref Range Status   Specimen Description BLOOD A-LINE DRAW  Final   Special Requests   Final    BOTTLES DRAWN AEROBIC AND ANAEROBIC Blood Culture adequate volume   Culture   Final    NO GROWTH 5 DAYS Performed at [facility],  [place] [address], [place], [place] [address]    Report Status 06/29/2018 FINAL  Final  Culture, respiratory (non-expectorated)     Status: None   Collection Time: 06/24/18  7:35 AM  Result Value Ref Range Status   Specimen Description TRACHEAL ASPIRATE  Final   Special Requests NONE  Final   Gram Stain   Final    MODERATE WBC PRESENT, PREDOMINANTLY PMN RARE BUDDING YEAST SEEN Performed at [facility], [place] [address] [place], [place] [address]    Culture ABUNDANT CANDIDA [place]  Final   Report Status 06/27/2018 FINAL  Final  Culture, blood (routine x 2)     Status: None   Collection Time: 06/24/18  8:01 AM  Result Value Ref Range Status   Specimen Description BLOOD CENTRAL LINE  Final   Special Requests   Final    BOTTLES DRAWN AEROBIC AND ANAEROBIC Blood Culture adequate volume   Culture   Final    NO GROWTH 5 DAYS Performed at [facility], [place] [address], [place], [place] [address]    Report Status 06/29/2018 FINAL  Final  MRSA PCR Screening     Status: None   Collection Time: 07/04/18  3:29 AM  Result Value Ref Range Status   MRSA by  PCR NEGATIVE NEGATIVE Final    Comment:        The GeneXpert MRSA Assay (FDA approved for NASAL specimens only), is one component of a comprehensive MRSA colonization surveillance program. It is not intended to diagnose MRSA infection nor to guide or monitor treatment for MRSA infections. Performed at [facility], [place] [address], [place], [place] [address]     Medical History: Past Medical History:  Diagnosis Date  . Anemia   . Arthritis    per pt (11/21/2017)  . CHF (congestive heart failure) ([place]) ~ 2017  . CKD stage 3 due to type 2 diabetes mellitus ([place])    stage 5 Dialysis T/Th/Sa  . CVA (cerebral vascular accident) [facility]) 2007   wife denies residual on 11/21/2017  . Diabetic peripheral neuropathy ([place])   . Dyspnea   . ESRD (end stage renal disease) on dialysis ([place])    "[place]; TTS" (11/21/2017)  . GERD  (gastroesophageal reflux disease)    has in the past  . Headache    since going on dialysis  . Hypertension   . Hypothyroidism   . PONV (postoperative nausea and vomiting)   . Restless legs   . Type II diabetes mellitus (HCC)     Assessment: 58 yoM s/p Torsades arrest on 4/8 early am with ROSC after CPR alone (no shocks or medications). Pharmacy asked to evaluate for QTc-prolonging medications. Of currently ordered medications, only pantoprazole has risk of QTc prolongation, though this is rare and likely due to risk of hypomagnesemia. Of home meds not currently ordered in-patient - albuterol, escitalopram, famotidine, and hydroxyzine can all potentially prolong QTc, with escitalopram being the most likely offender. Note that pt received two doses of prochlorperazine on 4/7 as well. Data regarding QTc prolongation with prochlorperazine is scarce but there appears to be some risk.   Plan:  -Could consider stopping protonix -Would avoid further prochlorperazine doses   Arrie Senate, PharmD, BCPS Clinical Pharmacist [phone] Please check AMION for all [place] numbers 07/04/2018

## 2018-07-04 NOTE — Progress Notes (Signed)
On call for cardiology Dr.Martin was notified of the patient having a 5 beat run of [place]. Pt is awake and talking, no complains of pain, no distress, and vital signs are stable. Informed doctor of most recently K and Mag levels. Order received to recheck Mag and K.

## 2018-07-04 NOTE — Progress Notes (Signed)
Physical Therapy Treatment Patient Details Name: Alexander Whitaker MRN: [id number] DOB: [birth year] Today's Date: 07/04/2018    History of Present Illness Pt is a 68 y.o. male admitted 06/24/18 with fever and AMS. CXR showed diffuse infiltrates. Worked up for sepsis, CAP, VDRF with hypoxia. 3/31 with NSTEMI. 4/1 afib with RVR subsequently had torsades vs VT, requiring compression and defibrillation. ETT 3/28-4/4. S/p cardiac cath showing nonobstructive disease. Transfer to ICU early 4/8 due to cardiac arrest with 3 mins of CPR with ROSC. PMH includes ESRD on HD, stroke, HTN, CHF.   PT Comments    Pt transferred to ICU early this morning due to cardiac arrest. Seen with OT due to change in medical status. Pt with increased fatigue and decreased awareness, requiring max cues to open eyes. Pt currently requiring maxA (+1-2) for mobility. Pt with decreased initiation and problem solving, difficult to encourage OOB mobility. As of yesterday, wife adamantly declining post-acute rehab and plans to have pt return home; pt with increased participation, continue to recommend CIR despite this.    Follow Up Recommendations  CIR;Supervision/Assistance - 24 hour(wife declined CIR, will return home)     Equipment Recommendations  Rolling walker with 5" wheels;3in1 (PT);Other (comment)(hoyer lift)    Recommendations for Other Services Rehab consult     Precautions / Restrictions Precautions Precautions: Fall Restrictions Weight Bearing Restrictions: No    Mobility  Bed Mobility Overal bed mobility: Needs Assistance Bed Mobility: Supine to Sit     Supine to sit: Max assist     General bed mobility comments: Pt lethargic, max cues to open eyes and interact; maxA to assist trunk elevation. Once sitting pt able to assist scooting hips to EOB. MaxA+2 return to supine as pt attempting to lay to wrong side with no awareness  Transfers                 General transfer comment: Pt not initiating  standing despite maxA+2 for trunk elevation; declining further attempts for transfer to recliner despite max encouragement  Ambulation/Gait                 Stairs             Wheelchair Mobility    Modified Rankin (Stroke Patients Only)       Balance Overall balance assessment: Needs assistance Sitting-balance support: Bilateral upper extremity supported Sitting balance-Leahy Scale: Fair                                      Cognition Arousal/Alertness: Lethargic Behavior During Therapy: Flat affect Overall Cognitive Status: Impaired/Different from baseline Area of Impairment: Orientation;Memory;Following commands;Safety/judgement;Awareness;Problem solving                 Orientation Level: Disoriented to;Time;Situation;Place   Memory: Decreased short-term memory Following Commands: Follows one step commands inconsistently Safety/Judgement: Decreased awareness of safety;Decreased awareness of deficits Awareness: Intellectual Problem Solving: Decreased initiation;Slow processing;Requires verbal cues General Comments: Increased fatigue this session, opening eyes to cues but then closing. Initially stating "yeah" to majority of questions, intermittently answering appropriate yes/no. Poor attention and awareness. Difficult to obtain baseline cognition from wife who reports "he has some trouble remembering year, but knows where he is and his birthday..."      Exercises      General Comments        Pertinent Vitals/Pain Pain Assessment: Faces Faces Pain Scale: No hurt  Home Living                      Prior Function            PT Goals (current goals can now be found in the care plan section) Acute Rehab PT Goals Patient Stated Goal: go home with [name] PT Goal Formulation: With patient/family Time For Goal Achievement: 07/15/18 Potential to Achieve Goals: Fair Progress towards PT goals: Progressing toward  goals    Frequency           PT Plan Current plan remains appropriate    Co-evaluation PT/OT/SLP Co-Evaluation/Treatment: Yes Reason for Co-Treatment: Necessary to address cognition/behavior during functional activity;For patient/therapist safety;To address functional/ADL transfers(new transfer to ICU, pt lethargy) PT goals addressed during session: Mobility/safety with mobility        AM-PAC PT "6 Clicks" Mobility   Outcome Measure  Help needed turning from your back to your side while in a flat bed without using bedrails?: A Lot Help needed moving from lying on your back to sitting on the side of a flat bed without using bedrails?: A Lot Help needed moving to and from a bed to a chair (including a wheelchair)?: A Lot Help needed standing up from a chair using your arms (e.g., wheelchair or bedside chair)?: A Lot Help needed to walk in hospital room?: Total Help needed climbing 3-5 steps with a railing? : Total 6 Click Score: 10    End of Session Equipment Utilized During Treatment: Gait belt Activity Tolerance: Patient limited by fatigue Patient left: in bed;with call bell/phone within reach Nurse Communication: Mobility status PT Visit Diagnosis: Unsteadiness on feet (R26.81);Other abnormalities of gait and mobility (R26.89)     Time: 5400-8676 PT Time Calculation (min) (ACUTE ONLY): 24 min  Charges:  $Therapeutic Activity: 8-22 mins                    Mabeline Caras, PT, DPT Acute Rehabilitation Services  Pager [phone] Office [place] 07/04/2018, 10:04 AM

## 2018-07-04 NOTE — Progress Notes (Signed)
Progress Note  Patient Name: Alexander Whitaker Date of Encounter: 07/04/2018  Primary Cardiologist: Dr. Ellyn Hack  Subjective   Feels well, now in 2 heart unit following a recurrent episode of torsades du pointes.  Inpatient Medications    Scheduled Meds: . amLODipine  10 mg Oral Daily  . aspirin  81 mg Oral Daily  . chlorhexidine  15 mL Mouth Rinse BID  . Chlorhexidine Gluconate Cloth  6 each Topical Q0600  . Chlorhexidine Gluconate Cloth  6 each Topical Q0600  . Chlorhexidine Gluconate Cloth  6 each Topical Q0600  . darbepoetin (ARANESP) injection - DIALYSIS  150 mcg Intravenous Q Tue-HD  . feeding supplement (NEPRO CARB STEADY)  237 mL Oral BID BM  . feeding supplement (PRO-STAT SUGAR FREE 64)  30 mL Oral BID  . heparin  5,000 Units Subcutaneous Q8H  . insulin aspart  0-5 Units Subcutaneous QHS  . insulin aspart  0-9 Units Subcutaneous TID WC  . levothyroxine  50 mcg Per Tube Q0600  . lisinopril  5 mg Oral Daily  . mouth rinse  15 mL Mouth Rinse BID  . metoprolol tartrate  50 mg Oral BID  . multivitamin  1 tablet Oral QHS  . pantoprazole  40 mg Oral Daily  . sodium chloride flush  3 mL Intravenous Q12H   Continuous Infusions: . sodium chloride 10 mL/hr at 07/04/18 1200  . ferric gluconate (FERRLECIT/NULECIT) IV Stopped (07/03/18 1257)  . lidocaine 1 mg/min (07/04/18 1200)  . sodium chloride 250 mL (06/28/18 1511)   PRN Meds: sodium chloride, acetaminophen, hydrALAZINE, sodium chloride, sodium chloride flush   Vital Signs    Vitals:   07/04/18 1000 07/04/18 1100 07/04/18 1119 07/04/18 1200  BP: (!) 152/57 133/60  (!) 135/58  Pulse: 76 64  71  Resp: 20 (!) 21  19  Temp:   98.2 F (36.8 C)   TempSrc:   Oral   SpO2: 100% 100%  99%  Weight:      Height:        Intake/Output Summary (Last 24 hours) at 07/04/2018 1411 Last data filed at 07/04/2018 1200 Gross per 24 hour  Intake 421.18 ml  Output -  Net 421.18 ml    I/O since admission: -297  Filed Weights   07/03/18 0621 07/03/18 0800 07/03/18 1220  Weight: 70.6 kg 70.4 kg 66.9 kg    Telemetry    Sinus - Personally Reviewed  ECG    ECG (independently read by me): Post recurrent torsade arrest: Normal sinus rhythm at 79, left axis deviation, LVH, QTc interval 616.  Previously noted T wave inversions V3 through V6 and mildly in 1 and aVL.  Physical Exam   BP (!) 135/58 (BP Location: Left Arm)   Pulse 71   Temp 98.2 F (36.8 C) (Oral)   Resp 19   Ht $R'5\' 5"'XL$  (1.651 m)   Wt 66.9 kg   SpO2 99%   BMI 24.54 kg/m  General: Alert, oriented, no distress.  Skin: normal turgor, no rashes, warm and dry HEENT: Normocephalic, atraumatic. Pupils equal round and reactive to light; sclera anicteric; extraocular muscles intact; Nose without nasal septal hypertrophy Mouth/Parynx benign;  Neck: No JVD, no carotid bruits; normal carotid upstroke Lungs: clear to ausculatation and percussion; no wheezing or rales Chest wall: without tenderness to palpitation Heart: PMI not displaced, RRR, s1 s2 normal, 1/6 systolic murmur, no diastolic murmur, no rubs, gallops, thrills, or heaves Abdomen: soft, nontender; no hepatosplenomehaly, BS+; abdominal aorta nontender and  not dilated by palpation. Back: no CVA tenderness Pulses 2+ Musculoskeletal: full range of motion, normal strength, no joint deformities Extremities: no clubbing cyanosis or edema, Homan's sign negative  Neurologic: grossly nonfocal; Cranial nerves grossly wnl Psychologic: Normal mood and affect   Labs    Chemistry Recent Labs  Lab 06/30/18 0320 07/02/18 0313 07/03/18 0649 07/03/18 1650 07/04/18 0204  NA 138 139 142 139 138  K 3.6 3.4* 3.3* 3.7 3.7  CL 102 103 104 99 100  CO2 24 23 21* 26 23  GLUCOSE 234* 130* 190* 240* 217*  BUN 34* 29* 38* 15 20  CREATININE 4.23* 4.95* 6.30* 3.05* 4.05*  CALCIUM 7.4* 7.8* 7.6* 8.1* 8.5*  ALBUMIN 2.1* 2.3* 2.4*  --   --   GFRNONAA 13* 11* 8* 20* 14*  GFRAA 15* 13* 10* 23* 16*  ANIONGAP 12 13  17* 14 15     Hematology Recent Labs  Lab 07/02/18 0313 07/03/18 0649 07/04/18 0204  WBC 4.7 4.7 8.4  RBC 3.10* 3.16* 3.77*  HGB 9.1* 9.2* 11.4*  HCT 28.8* 29.7* 35.7*  MCV 92.9 94.0 94.7  MCH 29.4 29.1 30.2  MCHC 31.6 31.0 31.9  RDW 14.4 14.4 14.4  PLT 179 221 332    Cardiac EnzymesNo results for input(s): TROPONINI in the last 168 hours. No results for input(s): TROPIPOC in the last 168 hours.   BNPNo results for input(s): BNP, PROBNP in the last 168 hours.   DDimer No results for input(s): DDIMER in the last 168 hours.   Lipid Panel     Component Value Date/Time   CHOL 183 11/22/2017 0322   TRIG 173 (H) 06/30/2018 0320   HDL 27 (L) 11/22/2017 0322   CHOLHDL 6.8 11/22/2017 0322   VLDL 57 (H) 11/22/2017 0322   LDLCALC 99 11/22/2017 0322     Radiology    No results found.  Cardiac Studies   Cath: 06/27/18   Prox RCA lesion is 30% stenosed.  Mid RCA-1 lesion is 20% stenosed.  Mid RCA-2 lesion is 30% stenosed.  Ost LM to Mid LM lesion is 5% stenosed.  Mid LAD lesion is 10% stenosed.  There is moderate left ventricular systolic dysfunction.  Moderate LV dysfunction with an EF estimated 40% with diffuse hypocontractility. LVEDP 13 mm.  Mild nonobstructive CAD with smooth 5% narrowing in a long large left main coronary artery which trifurcated into a large LAD, ramus intermediate, and left circumflex vessel. There was mild 10% smooth narrowing in the distal LAD.   Large dominant RCA with 3020 and 30% proximal to mid and mid distal narrowings.  RECOMMENDATION: Medical therapy for nonischemic cardiomyopathy. Present the patient continues to be on epinephrine, Neo-Synephrine, amiodarone. Plan dialysis as scheduled tomorrow.   ECHO IMPRESSIONS04/04/2018 1. The left ventricle has normal systolic function, with an ejection fraction of 60-65%. The cavity size was normal. There is mildly increased left ventricular wall thickness. No evidence of left  ventricular regional wall motion abnormalities. 2. The tricuspid valve was grossly normal. Tricuspid valve regurgitation is mild-moderate. 3. The aortic valve is tricuspid.  Patient Profile     68 y.o. male admitted with fever and sob, subsequently develoed PMVT in the setting of long QT in the setting of QT prolonging drugs.    Assessment & Plan    1.  Recurrent polymorphic ventricular tachycardia/torsade in the early morning excessively defibrillated x1 with restoration of sinus rhythm.  Of note, the patient had received a dose of Compazine shortly prior to the  episode.  Magnesium 2.0, potassium 8.7 today.  2.  Prolonged QTc interval.  Previous event may have been worsened by azithromycin and possible amiodarone.   Recurrent PMTearly am today  most likely contributed by a PRN dose of Compazine which apparently was ordered yesterday due to the patient's complaints of nausea.  Yesterday, since his resting heart rate was in the 90s, I further titrated metoprolol to 50 mg every 12 hours which she had tolerated.  He has been on metoprolol 50 mg twice a day magnesium and potassium are stable.  Must avoid all QTc prolonging drugs.  Suspect most recent polymorphic VT episode related to Compazine resulting in further QTC prolongation.  I have spoken with Dr. Crissie Sickles who had seen the patient previously.  Will DC lidocaine.  3.  Nonischemic cardiomyopathy with EF at cath estimated at 40% with diffuse hypocontractility.  Mild nonobstructive CAD.   3.  Stage renal disease on hemodialysis with dialysis on Tuesday Thursday and Saturday.  Last dialysis was yesterday morning  Signed, Troy Sine, MD, [facility] 07/04/2018, 2:11 PM

## 2018-07-04 NOTE — Progress Notes (Signed)
Inpatient Diabetes Program Recommendations  AACE/ADA: New Consensus Statement on Inpatient Glycemic Control (2015)  Target Ranges:  Prepandial:   less than 140 mg/dL      Peak postprandial:   less than 180 mg/dL (1-2 hours)      Critically ill patients:  140 - 180 mg/dL   Lab Results  Component Value Date   GLUCAP 186 (H) 07/04/2018   HGBA1C 9.1 (H) 11/21/2017    Review of Glycemic Control Results for Alexander Whitaker, Alexander Whitaker (MRN [id number]) as of 07/04/2018 13:42  Ref. Range 07/04/2018 06:37 07/04/2018 07:49 07/04/2018 11:21  Glucose-Capillary Latest Ref Range: 70 - 99 mg/dL 222 (H) 227 (H) 186 (H)   Diabetes history: DM 2 Outpatient Diabetes medications: Levemir 10 units daily (patient taking Levemir 50 units daily per medication reconciliation) Current orders for Inpatient glycemic control:  Novolog sensitive tid with meals and HS Inpatient Diabetes Program Recommendations:    Please consider adding Levemir 10 units daily.   Thanks,  Adah Perl, RN, BC-ADM Inpatient Diabetes Coordinator Pager [phone] (8a-5p)

## 2018-07-04 NOTE — Progress Notes (Signed)
SLP Cancellation Note  Patient Details Name: Alexander Whitaker MRN: [id number] DOB: 04-Dec-1950   Cancelled treatment:       Reason Eval/Treat Not Completed: Medical issues which prohibited therapy. Events of this morning noted. Per RN, pt took his meds well this morning, but that other POs are currently on hold pending Cardiology rounding. Will f/u as able for diet tolerance.   [name] 07/04/2018, 10:50 AM  [name], M.A. [place] Acute Environmental education officer [phone] Office [phone]

## 2018-07-04 NOTE — Progress Notes (Signed)
[facility] ROUNDING NOTE   Subjective:   This is a 68 year old gentleman with a history of community-acquired pneumonia and sepsis and pulmonary infiltrates.  He required intubation.  He also had a V. tach cardiac arrest with CPR lasted about 5 minutes and IV amiodarone 06/27/2018.  He was intubated and extubated on 06/30/2018.  He received CVVHD 06/23/2020 06/26/2018.  Events noted for the early morning of 07/04/2018 patient developed torsades and became unresponsive.  He received chest compressions for 3 minutes and was transferred to the intensive care unit.  He received magnesium and potassium  Blood pressure 129/59 pulse 74 temperature 98 O2 sats 100% 2 L nasal cannula  Dialysis 07/03/2018      3 L removed  Amlodipine 10 mg daily, aspirin 81 mg daily metoprolol 50 mg twice daily levothyroxine 50 mcg daily Protonix 40 mg daily Aranesp 150 mcg every 2 weeks, IV Nulicit load, lisinopril 5 mg daily  Sodium 138 potassium 3.7 chloride 100 CO2 23 BUN 20 creatinine 4.05 glucose 217 calcium 8.5 magnesium 2.0 WBC 8.4 hemoglobin 11.4 platelets 332  Chest x-ray showed improving pulmonary edema.    Objective:  Vital signs in last 24 hours:  Temp:  [98 F (36.7 C)-99.3 F (37.4 C)] 98 F (36.7 C) (04/08 0400) Pulse Rate:  [69-101] 74 (04/08 0700) Resp:  [16-29] 22 (04/08 0700) BP: (127-181)/(55-86) 129/59 (04/08 0700) SpO2:  [96 %-100 %] 100 % (04/08 0700) Weight:  [66.9 kg-70.4 kg] 66.9 kg (04/07 1220)  Weight change: -0.2 kg Filed Weights   07/03/18 0621 07/03/18 0800 07/03/18 1220  Weight: 70.6 kg 70.4 kg 66.9 kg    Intake/Output: I/O last 3 completed shifts: In: 459.2 [P.O.:160; I.V.:97.2; IV Piggyback:202] Out: 3000 [Other:3000]   Intake/Output this shift:  No intake/output data recorded. Awake alert oriented no distress CVS- RRR RS- CTA no wheezes or rales ABD- BS present soft non-distended EXT- no edema AV fistula positive bruit  [place] T[facility]  4 hours 76.5 kg    2/2.25 bath   no heparin   right upper arm AV fistula 400/800 Micera 200 mcg every 2 weeks Venofer 50 mg weekly    Basic Metabolic Panel: Recent Labs  Lab 06/28/18 0459 06/29/18 0518 06/30/18 0320 07/02/18 0313 07/03/18 0649 07/03/18 1650 07/04/18 0204  NA 143 143 138 139 142 139 138  K 4.1 3.3* 3.6 3.4* 3.3* 3.7 3.7  CL 107 109 102 103 104 99 100  CO2 17* 21* 24 23 21* 26 23  GLUCOSE 151* 177* 234* 130* 190* 240* 217*  BUN 34* 49* 34* 29* 38* 15 20  CREATININE 6.09* 7.44* 4.23* 4.95* 6.30* 3.05* 4.05*  CALCIUM 7.7* 7.6* 7.4* 7.8* 7.6* 8.1* 8.5*  MG 2.5* 2.4 1.9 2.0  --   --  2.0  PHOS 4.7*  --  3.1 3.9 4.7*  --   --     Liver Function Tests: Recent Labs  Lab 06/30/18 0320 07/02/18 0313 07/03/18 0649  ALBUMIN 2.1* 2.3* 2.4*   No results for input(s): LIPASE, AMYLASE in the last 168 hours. No results for input(s): AMMONIA in the last 168 hours.  CBC: Recent Labs  Lab 06/28/18 0459  06/30/18 0320 07/01/18 0227 07/02/18 0313 07/03/18 0649 07/04/18 0204  WBC 10.1   < > 5.9 5.8 4.7 4.7 8.4  NEUTROABS 7.7  --   --   --   --   --   --   HGB 9.0*   < > 8.3* 9.3* 9.1* 9.2* 11.4*  HCT 30.4*   < > 27.4* 30.1* 28.8* 29.7* 35.7*  MCV 100.0   < > 96.1 95.6 92.9 94.0 94.7  PLT 158   < > 119* 133* 179 221 332   < > = values in this interval not displayed.    Cardiac Enzymes: Recent Labs  Lab 06/27/18 1012  TROPONINI 2.75*    BNP: Invalid input(s): POCBNP  CBG: Recent Labs  Lab 07/03/18 0908 07/03/18 1311 07/03/18 1621 07/03/18 2123 07/04/18 0637  GLUCAP 145* 139* 213* 155* 62*    Microbiology: Results for orders placed or performed during the hospital encounter of 06/24/18  MRSA PCR Screening     Status: None   Collection Time: 06/24/18  4:12 AM  Result Value Ref Range Status   MRSA by PCR NEGATIVE NEGATIVE Final    Comment:        The GeneXpert MRSA Assay (FDA approved for NASAL specimens only), is one component of a comprehensive MRSA  colonization surveillance program. It is not intended to diagnose MRSA infection nor to guide or monitor treatment for MRSA infections. Performed at [facility], [place] [address], [place], [place] [address]   Culture, blood (routine x 2) Call MD if unable to obtain prior to antibiotics being given     Status: None   Collection Time: 06/24/18  6:39 AM  Result Value Ref Range Status   Specimen Description BLOOD A-LINE DRAW  Final   Special Requests   Final    BOTTLES DRAWN AEROBIC AND ANAEROBIC Blood Culture adequate volume   Culture   Final    NO GROWTH 5 DAYS Performed at [facility], [place] [address], [place], [place] [address]    Report Status 06/29/2018 FINAL  Final  Culture, respiratory (non-expectorated)     Status: None   Collection Time: 06/24/18  7:35 AM  Result Value Ref Range Status   Specimen Description TRACHEAL ASPIRATE  Final   Special Requests NONE  Final   Gram Stain   Final    MODERATE WBC PRESENT, PREDOMINANTLY PMN RARE BUDDING YEAST SEEN Performed at [facility], [place] [address], [place], [place] [address]    Culture ABUNDANT CANDIDA [place]  Final   Report Status 06/27/2018 FINAL  Final  Culture, blood (routine x 2)     Status: None   Collection Time: 06/24/18  8:01 AM  Result Value Ref Range Status   Specimen Description BLOOD CENTRAL LINE  Final   Special Requests   Final    BOTTLES DRAWN AEROBIC AND ANAEROBIC Blood Culture adequate volume   Culture   Final    NO GROWTH 5 DAYS Performed at [facility], [place] [address] [place], [place] [address]    Report Status 06/29/2018 FINAL  Final  MRSA PCR Screening     Status: None   Collection Time: 07/04/18  3:29 AM  Result Value Ref Range Status   MRSA by PCR NEGATIVE NEGATIVE Final    Comment:        The GeneXpert MRSA Assay (FDA approved for NASAL specimens only), is one component of a comprehensive MRSA colonization surveillance program. It is not intended to diagnose  MRSA infection nor to guide or monitor treatment for MRSA infections. Performed at [facility], [place] [address] [place], [place] [address]     Coagulation Studies: No results for input(s): LABPROT, INR in the last 72 hours.  Urinalysis: No results for input(s): COLORURINE, LABSPEC, [place], [place], [place],  BILIRUBINUR, KETONESUR, PROTEINUR, UROBILINOGEN, NITRITE, LEUKOCYTESUR in the last 72 hours.  Invalid input(s): APPERANCEUR    Imaging: No results found.   Medications:   . sodium chloride 10 mL/hr at 07/04/18 0700  . ferric gluconate (FERRLECIT/NULECIT) IV Stopped (07/03/18 1257)  . lidocaine 1 mg/min (07/04/18 0700)  . sodium chloride 250 mL (06/28/18 1511)   . amLODipine  10 mg Oral Daily  . aspirin  81 mg Per Tube Daily  . chlorhexidine  15 mL Mouth Rinse BID  . Chlorhexidine Gluconate Cloth  6 each Topical Q0600  . Chlorhexidine Gluconate Cloth  6 each Topical Q0600  . darbepoetin (ARANESP) injection - DIALYSIS  150 mcg Intravenous Q Tue-HD  . feeding supplement (NEPRO CARB STEADY)  237 mL Oral BID BM  . feeding supplement (PRO-STAT SUGAR FREE 64)  30 mL Oral BID  . heparin  5,000 Units Subcutaneous Q8H  . insulin aspart  0-5 Units Subcutaneous QHS  . insulin aspart  0-9 Units Subcutaneous TID WC  . levothyroxine  50 mcg Per Tube Q0600  . lisinopril  5 mg Oral Daily  . mouth rinse  15 mL Mouth Rinse q12n4p  . mouth rinse  15 mL Mouth Rinse BID  . metoprolol tartrate  50 mg Per Tube BID  . multivitamin  1 tablet Oral QHS  . pantoprazole  40 mg Oral Daily  . pantoprazole sodium  40 mg Oral Q24H  . sodium chloride flush  3 mL Intravenous Q12H   sodium chloride, acetaminophen, hydrALAZINE, sodium chloride, sodium chloride flush  Assessment/ Plan:   Community acquired pneumonia.  Intubated now extubated.  COVID-19 test negative.  Receiving IV antibiotics.  It appears her last dose was 06/30/2018 of Rocephin..  Azithromycin administered 06/23/2020 -   06/28/2018 chest x-ray improved.  But decreasing oxygen requirements.  End-stage renal disease receiving dialysis Tuesday Thursday Saturday next treatment 07/05/2018  VT cardiac arrest 06/27/2018.  Torsades 07/04/2018 started on IV lidocaine  Ventilator dependent respiratory failure extubated 06/30/2018  Hypertension/volume appears to be doing well will continue to challenge estimated dry weight encourage compliance with dialysis.  Have added lisinopril 5 mg nightly.  Increase dose of metoprolol and patient appears to have a better blood pressure.  anemia iron low T sats 10% started IV iron loading, darbepoetin to be administered 150 mcg 07/03/2018  Bones.  No binders at this present time  Nutrition stable at this present time we will continue to follow consider adding Protostat and vitamins  Diabetes mellitus sliding scale  Hypothyroidism on replacement therapy  Congestive heart failure with diastolic dysfunction 2D echo performed 06/28/2018   LOS: [place] $RemoveBefore'@TODAY'zDjTTlwMQtPaB$ $RemoveB'@7'sdWtEfNn$ :37 AM

## 2018-07-04 NOTE — Progress Notes (Signed)
Occupational Therapy Treatment Patient Details Name: Alexander Whitaker MRN: [id number] DOB: 17-Aug-1950 Today's Date: 07/04/2018    History of present illness Pt is a 68 y.o. male admitted 06/24/18 with fever and AMS. CXR showed diffuse infiltrates. Worked up for sepsis, CAP, VDRF with hypoxia. 3/31 with NSTEMI. 4/1 afib with RVR subsequently had torsades vs VT, requiring compression and defibrillation. ETT 3/28-4/4. S/p cardiac cath showing nonobstructive disease. Transfer to ICU early 4/8 due to cardiac arrest with 3 mins of CPR with ROSC. PMH includes ESRD on HD, stroke, HTN, CHF.   OT comments  Pt with new Cardiac arrest and increased lethargy this session - limiting participation. Pt was able to sit EOB min guard for basic grooming tasks requiring cues. He declined OOB despite max education and encouragement. Pt very confused this session, saying he planted "fish" and that both of his daughters are named "Barnabas Lister" CIR continues to be most appropriate venue of care. OT will continue to follow acutely.    Follow Up Recommendations  CIR;Supervision/Assistance - 24 hour    Equipment Recommendations  Other (comment);3 in 1 bedside commode(defer to next venue of care)    Recommendations for Other Services      Precautions / Restrictions Precautions Precautions: Fall Restrictions Weight Bearing Restrictions: No       Mobility Bed Mobility Overal bed mobility: Needs Assistance Bed Mobility: Supine to Sit;Sit to Supine     Supine to sit: Max assist Sit to supine: Mod assist   General bed mobility comments: Pt lethargic, max cues to open eyes and interact; maxA to assist trunk elevation. Once sitting pt able to assist scooting hips to EOB. MaxA+2 return to supine as pt attempting to lay to wrong side with no awareness  Transfers                 General transfer comment: Pt not initiating standing despite maxA+2 for trunk elevation; declining further attempts for transfer to recliner  despite max encouragement and education    Balance Overall balance assessment: Needs assistance Sitting-balance support: Bilateral upper extremity supported Sitting balance-Leahy Scale: Fair Sitting balance - Comments: without UE support while participating in grooming and self feeding                                   ADL either performed or assessed with clinical judgement   ADL Overall ADL's : Needs assistance/impaired     Grooming: Moderate assistance;Sitting Grooming Details (indicate cue type and reason): cognition impacting abiulity to perform grooming                               General ADL Comments: Sat EOB, declining OOB despite max education, Pt with eyes closed throughout most of session despite increased light, positional changes, and cool wash cloth     Vision       Perception     Praxis      Cognition Arousal/Alertness: Lethargic Behavior During Therapy: Flat affect Overall Cognitive Status: Impaired/Different from baseline Area of Impairment: Orientation;Memory;Following commands;Safety/judgement;Awareness;Problem solving                 Orientation Level: Disoriented to;Time;Situation;Place   Memory: Decreased short-term memory Following Commands: Follows one step commands inconsistently Safety/Judgement: Decreased awareness of safety;Decreased awareness of deficits Awareness: Intellectual Problem Solving: Decreased initiation;Slow processing;Requires verbal cues General Comments: Increased fatigue this session,  opening eyes to cues but then closing. Initially stating "yeah" to majority of questions, intermittently answering appropriate yes/no. Poor attention and awareness. Difficult to obtain baseline cognition from wife who reports "he has some trouble remembering year, but knows where he is and his birthday..."        Exercises     Shoulder Instructions       General Comments O2 remained 99 throughout session  on RA- left off at EOS RN aware    Pertinent Vitals/ Pain       Pain Assessment: Faces Faces Pain Scale: No hurt Pain Intervention(s): Monitored during session;Repositioned  Home Living                                          Prior Functioning/Environment              Frequency  Min 2X/week        Progress Toward Goals  OT Goals(current goals can now be found in the care plan section)  Progress towards OT goals: Not progressing toward goals - comment(lethargy, change in medical status, goals remain appropriate)  Acute Rehab OT Goals Patient Stated Goal: go home with [name] OT Goal Formulation: With patient Time For Goal Achievement: 07/16/18 Potential to Achieve Goals: Good  Plan Discharge plan remains appropriate;Frequency remains appropriate    Co-evaluation    PT/OT/SLP Co-Evaluation/Treatment: Yes Reason for Co-Treatment: Necessary to address cognition/behavior during functional activity;For patient/therapist safety;To address functional/ADL transfers;Other (comment)(change in medical status) PT goals addressed during session: Mobility/safety with mobility;Balance;Strengthening/ROM OT goals addressed during session: ADL's and self-care;Strengthening/ROM      AM-PAC OT "6 Clicks" Daily Activity     Outcome Measure   Help from another person eating meals?: Total(NPO) Help from another person taking care of personal grooming?: A Little Help from another person toileting, which includes using toliet, bedpan, or urinal?: A Lot Help from another person bathing (including washing, rinsing, drying)?: A Lot Help from another person to put on and taking off regular upper body clothing?: A Little Help from another person to put on and taking off regular lower body clothing?: Total 6 Click Score: 12    End of Session    OT Visit Diagnosis: Unsteadiness on feet (R26.81);Muscle weakness (generalized) (M62.81);Other symptoms and signs  involving cognitive function   Activity Tolerance Patient limited by lethargy   Patient Left in bed;with call bell/phone within reach;with bed alarm set   Nurse Communication Mobility status;Other (comment)(O2 saturations, left on RA)        Time: 3614-4315 OT Time Calculation (min): 24 min  Charges: OT General Charges $OT Visit: 1 Visit OT Treatments $Self Care/Home Management : 8-22 mins  Hulda Humphrey OTR/L Acute Rehabilitation Services Pager: [phone] Office: [place] 07/04/2018, 11:09 AM

## 2018-07-04 NOTE — Code Documentation (Signed)
  Patient Name: Alexander Whitaker   MRN: [id number]   Date of Birth/ Sex: 1950/12/06 , male      Admission Date: 06/24/2018  Attending Provider: Larey Dresser, MD  Primary Diagnosis: Sepsis [facility])   Indication: Pt was in his usual state of health until this AM, when he was noted to be unresponsive. Code blue was subsequently called. At the time of arrival on scene, ROSC had already been achieved after 3 minutes of chest compressions. Per RN, he was in torsades prior to becoming unresponsive. She was about to administer Mag when he became unresponsive. EKG prior to code showed prolonged QTc of 616. The code team discontinued his compazine, which he had received a few hours prior to code. No other QT prolonging medications were identified.   Technical Description:  - CPR performance duration:  3 minutes  - Was defibrillation or cardioversion used? No   - Was external pacer placed? No  - Was patient intubated pre/post CPR? No   Medications Administered: Y = Yes; Blank = No Amiodarone    Atropine    Calcium    Epinephrine    Lidocaine    Magnesium    Norepinephrine    Phenylephrine    Sodium bicarbonate    Vasopressin     Post CPR evaluation:  - Final Status - Was patient successfully resuscitated ? Yes - What is current rhythm? NSR with prolonged QT - What is current hemodynamic status? Stable  Miscellaneous Information:  - Labs sent, including: BMP, Mag, CBC  - Primary team notified?  Yes  - Family Notified? Yes  - Additional notes/ transfer status: Transferred to [place]. Started on a lidocaine drip and given mag per cardiology.      Dorrell, Andree Elk, MD  07/04/2018, 3:41 AM

## 2018-07-04 NOTE — Significant Event (Addendum)
Shift event: NP paged that pt had cardiac arrested, had only 3 mins of CPR with ROSC. No ACLS meds were given. NP to bedside. S: Pt states he feels "great". No CP, palpitations, SOB, HA, n/v, or weakness. Per RN, she called cardio earlier because pt's QTc was prolonged and he was experiencing some arrythmia. Mg and K was ordered. When RN went into the room to hang the Mg, they pt just "went out". ACLS was started.  O: Chronically ill appearing elderly male in NAD. He does not appear toxic. He is alert and oriented. BP 157/82, HR 81. 12 lead without acute changes. Card: RRR. Lungs-CTA except for mild rhonchi in RLL. MOE x 4 spontaneously. Neuro is non focal. No LE edema. Skin pale, but dry, warm.  A/P: 1. Cardiac arrest-CPR x 3 mins with ROSC. No intubation or ACLS meds. Called cardiology and discussed. MD familiar with pt because of earlier page. Lidocaine drip per pharmacy dosing ordered per cardiology recs. Pt to be moved to [facility] ICU. Will ask PCCM to see as well. Pt was on PCCM service when first transferred from [place]. NPO for now.  2. Prolonged QTc-in review of earlier notes, was thought to be secondary to Amiodorone and Azithromycin. These meds were stopped. Pt has been followed by cardiology since admission. Replace electrolytes.  KJKG, NP [facility] Will touch base with pt to see if he would like me to call his wife and give her an update. Called wife with update.  Called PCCM as well.   Total critical care time: Start 0215  End 0320 for a total of 65 mins. Critical care time was exclusive of separately billable procedures and treating other patients. Critical care was necessary to treat or prevent imminent or life-threatening deterioration. Critical care was time spent personally by me on the following activities: development of treatment plan with patient and/or surrogate as well as nursing, discussions with consultants, evaluation of patient's response to treatment, examination of patient,  obtaining history from patient or surrogate, ordering and performing treatments and interventions, ordering and review of laboratory studies, ordering and review of radiographic studies, pulse oximetry and re-evaluation of patient's condition.

## 2018-07-04 NOTE — Treatment Plan (Signed)
Overnight events noted. Patient experienced symptomatic torsades/Vtach overnight resulting in Code Blue being called and brief chest compressions. Since then, pt was started on lidocaine gtt. Cardiology had already been following. After overnight event, Cardiology since listed as attending. Pt observed briefly and appears otherwise stable at this time. As pt is on lidocaine gtt, will follow peripherally for now.

## 2018-07-04 NOTE — Significant Event (Signed)
Dr Aundra Dubin notified at approx 0140 pt had run of torsades and having more frequent NSVT. At the time pt was asymptomatic, BP 148/64. Orders received for Magnesium 2gm IV and Potassium 22meq PO.  Upon entering room to hang Mg pt became unresponsive and monitor showing sustained VT. Code Blue called and CPR initiated at 0200. Pt regained ROSC with 3 min chest compressions-no drugs pushed or Shock delivered. Rapid Response and Tylene Fantasia NP in to evaluate and patient transferred to 2H02. Per verbal Jacques Earthly NP to call patients wife. Jessie Foot, RN

## 2018-07-05 DIAGNOSIS — E108 Type 1 diabetes mellitus with unspecified complications: Secondary | ICD-10-CM

## 2018-07-05 DIAGNOSIS — I4729 Other ventricular tachycardia: Secondary | ICD-10-CM

## 2018-07-05 DIAGNOSIS — I428 Other cardiomyopathies: Secondary | ICD-10-CM

## 2018-07-05 DIAGNOSIS — Z8679 Personal history of other diseases of the circulatory system: Secondary | ICD-10-CM

## 2018-07-05 DIAGNOSIS — I472 Ventricular tachycardia: Secondary | ICD-10-CM

## 2018-07-05 LAB — GLUCOSE, CAPILLARY
Glucose-Capillary: 173 mg/dL — ABNORMAL HIGH (ref 70–99)
Glucose-Capillary: 242 mg/dL — ABNORMAL HIGH (ref 70–99)
Glucose-Capillary: 226 mg/dL — ABNORMAL HIGH (ref 70–99)
Glucose-Capillary: 182 mg/dL — ABNORMAL HIGH (ref 70–99)
Glucose-Capillary: 113 mg/dL — ABNORMAL HIGH (ref 70–99)
Glucose-Capillary: 193 mg/dL — ABNORMAL HIGH (ref 70–99)

## 2018-07-05 LAB — POCT ACTIVATED CLOTTING TIME: Activated Clotting Time: 109 seconds

## 2018-07-05 MED ORDER — LABETALOL HCL 5 MG/ML IV SOLN: 5.0000 mg | INTRAVENOUS | Status: DC | PRN

## 2018-07-05 MED ORDER — METOPROLOL TARTRATE 50 MG PO TABS
75.0000 mg | ORAL_TABLET | Freq: Two times a day (BID) | ORAL | Status: DC
  Administered 2018-07-05 – 2018-07-06 (×4): 75 mg via ORAL
  Filled 2018-07-05 (×4): qty 1

## 2018-07-05 MED ORDER — INSULIN DETEMIR 100 UNIT/ML ~~LOC~~ SOLN
10.0000 [IU] | Freq: Every day | SUBCUTANEOUS | Status: DC
  Administered 2018-07-05 – 2018-07-08 (×4): 10 [IU] via SUBCUTANEOUS
  Filled 2018-07-05 (×6): qty 0.1

## 2018-07-05 NOTE — Progress Notes (Signed)
PT Cancellation Note  Patient Details Name: Alexander Whitaker MRN: [id number] DOB: [birth year]   Cancelled Treatment:    Reason Eval/Treat Not Completed: Patient at procedure or test/unavailable. Pt getting HD.   Shary Decamp [facility] 07/05/2018, 2:41 PM [place] Pager [phone] Office [phone]

## 2018-07-05 NOTE — Progress Notes (Signed)
Nutrition Follow-up  RD working remotely.  DOCUMENTATION CODES:   Not applicable  INTERVENTION:   If pt continues to refuse PO intake, would recommend palliative care consult for [place] discussion specifically regarding nutrition support.  - Continue Nepro Shake po BID, each supplement provides 425 kcal and 19 grams protein  - Continue rena-vit daily  - Encourage adequate PO intake  NUTRITION DIAGNOSIS:   Inadequate oral intake related to decreased appetite, dysphagia as evidenced by meal completion < 50%.  Ongoing  GOAL:   Patient will meet greater than or equal to 90% of their needs  Progressing  MONITOR:   PO intake, Supplement acceptance, Labs, Weight trends, I & O's  REASON FOR ASSESSMENT:   Ventilator    ASSESSMENT:   68 yo male with PMH of HTN, dyspnea, ESRD on HD, CHF, DM-2, CVA who was admitted from [facility] with AMS, respiratory failure requiring intubation.  4/04 - extubated 4/05 - s/p MBS with recommendations for DYS 2, thin liquids 4/08 - Code Blue due to torsades/Vtach and CPR initiated, transferred to ICU  Spoke with RN via phone call. RN reports pt has not been eating much and only ate some of [place] at lunch meal. RN reports PO intake is limited mainly by pt's mental status and agitation. Pt has not been receptive to PO intake. Plan is for HD later today and RN hopes pt's mental status will improve after. Noted pt has also been refusing medications and pulled out some IV's.  Will monitor for improvements in mental status and will monitor PO intake. If pt continues to refuse to eat, recommend palliative care consult for [place] discussion. Pt is at risk for malnutrition.  Overall, weight down 20 lbs since admission on 06/24/18. Unsure how much of weight loss is related to fluid status vs true weight loss. Per RN edema assessment, pt with generalized edema.  Meal Completion: 10% x 1 meal on 4/09  Medications reviewed and include: Nepro BID (pt  refusing), Pro-stat 30 ml BID (pt refusing), SSI, Levemir 10 units daily, rena-vit, Protonix  Labs reviewed. CBG's: 182, 226, 242, 173 x 24 hours  Net UF on 4/07: 3000 ml Post-HD weight: 66.9 kg  Diet Order:   Diet Order            DIET DYS 2 Room service appropriate? Yes; Fluid consistency: Thin  Diet effective now              EDUCATION NEEDS:   No education needs have been identified at this time  Skin:  Skin Assessment: Reviewed RN Assessment  Last BM:  07/05/18 medium type 6  Height:   Ht Readings from Last 1 Encounters:  07/01/18 $RemoveB'5\' 5"'EQOtnGhj$  (1.651 m)    Weight:   Wt Readings from Last 1 Encounters:  07/05/18 67.3 kg    Ideal Body Weight:  61.8 kg  BMI:  Body mass index is 24.69 kg/m.  Estimated Nutritional Needs:   Kcal:  4315-4008  Protein:  90-110 gm  Fluid:  1 L + UOP    Gaynell Face, MS, RD, LDN Inpatient Clinical Dietitian Pager: [phone] Weekend/After Hours: [phone]

## 2018-07-05 NOTE — Progress Notes (Signed)
SLP Cancellation Note  Patient Details Name: Alexander Whitaker MRN: [id number] DOB: Apr 08, 1950   Cancelled treatment:    Attempted to see pt for ongoing dysphagia therapy.  Held per RN at this time. Pt has been agitated today and has not been receptive to PO intake.  No clinical s/s of aspiration noted with minimal intake per RN.  Will reattempt as SLP schedule permits.       Celedonio Savage, MA, [place] Office: [phone] 07/05/2018, 1:48 PM

## 2018-07-05 NOTE — Progress Notes (Signed)
PROGRESS NOTE    Alexander Whitaker  ZOX:[id number] DOB: 01-Dec-1950 DOA: 06/24/2018 PCP: [name], MD    Brief Narrative:  Alexander Whitaker 68 y/o with ESRD on HD, CVA, HTN, CHF sent from [facility] for fever 101.6 and obtunded state for which he was intubated.  CXR showed diffuse infiltrates- started on Vanc, Ceftriaxone and Zithro. Blood sugar of 638, HONK, started on Insulin infusion. Hypotensive, started on Levophed.   Admitted to ICU team.   Femoral aline 06/24/18 RIJ 06/24/18  3/31-NSTEMI-  Troponin 2.75- Cardiology consulted 4/1- A-fib with RVR started on Amio infusion- subsequently had ? Torsades vs VT- compression, defib x 1> ROSC 4/2- EP consulted- suspected QT (which was already prolonged) became more prolonged with Amio, Azithromycin and abnormal electrolytes 4/1- Cardiac cath > no significant ischemia 4/2 Limited ECHO- normal EF, no diastolic dysfunction- mild to mod TR 4/4 Extubated   Assessment & Plan:   Principal Problem:   Sepsis ([place]) Active Problems:   Acute diastolic congestive heart failure (HCC)   Diabetes mellitus, type 2 (HCC)   ESRD (end stage renal disease) ([place])   Essential hypertension   Respiratory failure with hypoxia (HCC)   Cardiac arrest with ventricular fibrillation (HCC)   Non-ST elevation (NSTEMI) myocardial infarction ([place])   History of CVA (cerebrovascular accident)   Acute on chronic combined systolic and diastolic CHF (congestive heart failure) (HCC)   Dysphagia   Anemia of chronic disease   Prolonged QT interval   Polymorphic ventricular tachycardia (HCC)   NSVT (nonsustained ventricular tachycardia) (HCC)   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 Given short course of lidocaine gtt - Brief episode of vtach overnight noted -Cardiology following - avoid QT prolonging meds    Acute diastolic congestive heart failure - CXR 4/3> Improving pulmonary edema pattern. Mild residual perihilar/infrahilar opacities. - fluid management with dialysis - Nephrology following  Dysphagia oropharyngeal phase after intubation - now on D 2 diet as tolerated    Diabetes mellitus, type 2  - uses Levemir 10 U at home - currently on SSI as needed    ESRD (end stage renal disease) - dialysis per nephrology - Seems stable  Anemia due to chronic disease - appreciate renal management of this    Essential hypertension - on Lopressor, Amlodipine - BP stable, albeit suboptimally controlled - Continue hydralazine IV PRN sbp>160 -will add PRN IV labetalol for added BP control  Hypothyroidism -cont Synthroid as tolerated   GERD - on Protonix daily at home   DVT prophylaxis: heparin subQ Code Status: Full Family Communication: Pt in room, family not at bedside, pt declined MD calling Disposition Plan: Uncertain at this time  Consultants:   Cardiology  Nephrology  Procedures:  1. The left ventricle has normal systolic function, with an ejection fraction of 60-65%. The cavity size was normal. There is mildly increased left ventricular wall thickness. No evidence of left ventricular regional wall motion  abnormalities. 2. The tricuspid valve was grossly normal. Tricuspid valve regurgitation is mild-moderate. 3. The aortic valve is tricuspid.  Antimicrobials: Anti-infectives (From admission, onward)   Start     Dose/Rate Route  Frequency Ordered Stop   06/28/18 1000  azithromycin (ZITHROMAX) 500 mg in sodium chloride 0.9 % 250 mL IVPB  Status:  Discontinued     500 mg 250 mL/hr over 60 Minutes Intravenous Every 24 hours 06/27/18 1007 06/28/18 0749   06/26/18 1200  vancomycin (VANCOCIN) IVPB 750 mg/150 ml premix  Status:  Discontinued     750 mg 150 mL/hr over 60 Minutes Intravenous Every T-Th-Sa (Hemodialysis) 06/24/18 0527 06/24/18 1316   06/24/18 2200  cefTRIAXone (ROCEPHIN) 1 g in sodium chloride 0.9 % 100 mL IVPB     1 g 200 mL/hr over 30 Minutes Intravenous Every 24 hours 06/24/18 0428 06/30/18 2153   06/24/18 2200  vancomycin (VANCOCIN) IVPB 750 mg/150 ml premix  Status:  Discontinued     750 mg 150 mL/hr over 60 Minutes Intravenous Every 24 hours 06/24/18 1316 06/26/18 0955   06/24/18 0500  azithromycin (ZITHROMAX) 500 mg in sodium chloride 0.9 % 250 mL IVPB  Status:  Discontinued     500 mg 250 mL/hr over 60 Minutes Intravenous Every 24 hours 06/24/18 0428 06/27/18 1007       Subjective: Reports feeling well this AM. Denies chest pain  Objective: Vitals:   07/05/18 0800 07/05/18 0900 07/05/18 1035 07/05/18 1109  BP: (!) 160/58 (!) 150/58 (!) 171/63   Pulse: 74 66 74   Resp: 19 18    Temp:    (!) 97.4 F (36.3 C)  TempSrc:    Oral  SpO2: 96% 97%    Weight:      Height:        Intake/Output Summary (Last 24 hours) at 07/05/2018 1238 Last data filed at 07/04/2018 2326 Gross per 24 hour  Intake 343.34 ml  Output -  Net 343.34 ml   Filed Weights   07/03/18 0800 07/03/18 1220 07/05/18 0638  Weight: 70.4 kg 66.9 kg 67.3 kg    Examination:  General exam: Appears calm and comfortable, laying in bed Respiratory system: Clear to auscultation. Respiratory effort normal. Cardiovascular system: S1 & S2 heard, RRR Gastrointestinal system: Abdomen is nondistended, soft and nontender. No organomegaly or masses felt. Normal bowel sounds heard. Central nervous system: Alert and oriented. No focal  neurological deficits. Extremities: Symmetric 5 x 5 power. Skin: No rashes, lesions  Psychiatry: Judgement and insight appear normal. Mood & affect appropriate.   Data Reviewed: I have personally reviewed following labs and imaging studies  CBC: Recent Labs  Lab 06/30/18 0320 07/01/18 0227 07/02/18 0313 07/03/18 0649 07/04/18 0204  WBC 5.9 5.8 4.7 4.7 8.4  HGB 8.3* 9.3* 9.1* 9.2* 11.4*  HCT 27.4* 30.1* 28.8* 29.7* 35.7*  MCV 96.1 95.6 92.9 94.0 94.7  PLT 119* 133* 179 221 373   Basic Metabolic Panel: Recent Labs  Lab 06/29/18 0518 06/30/18 0320 07/02/18 0313 07/03/18 0649 07/03/18 1650 07/04/18 0204 07/04/18 2251  NA 143 138 139 142 139 138  --   K 3.3* 3.6 3.4* 3.3* 3.7 3.7 3.7  CL 109 102 103 104 99 100  --   CO2 21* 24 23 21* 26 23  --   GLUCOSE 177* 234* 130* 190* 240* 217*  --   BUN 49* 34* 29* 38* 15 20  --   CREATININE 7.44* 4.23* 4.95* 6.30* 3.05* 4.05*  --  CALCIUM 7.6* 7.4* 7.8* 7.6* 8.1* 8.5*  --   MG 2.4 1.9 2.0  --   --  2.0 2.4  PHOS  --  3.1 3.9 4.7*  --   --   --    GFR: Estimated Creatinine Clearance: 15 mL/min (A) (by C-G formula based on SCr of 4.05 mg/dL (H)). Liver Function Tests: Recent Labs  Lab 06/30/18 0320 07/02/18 0313 07/03/18 0649  ALBUMIN 2.1* 2.3* 2.4*   No results for input(s): LIPASE, AMYLASE in the last 168 hours. No results for input(s): AMMONIA in the last 168 hours. Coagulation Profile: No results for input(s): INR, PROTIME in the last 168 hours. Cardiac Enzymes: No results for input(s): CKTOTAL, CKMB, CKMBINDEX, TROPONINI in the last 168 hours. BNP (last 3 results) No results for input(s): PROBNP in the last 8760 hours. HbA1C: No results for input(s): HGBA1C in the last 72 hours. CBG: Recent Labs  Lab 07/04/18 1121 07/04/18 1532 07/04/18 2314 07/05/18 0648 07/05/18 1107  GLUCAP 186* 173* 242* 226* 182*   Lipid Profile: No results for input(s): CHOL, HDL, LDLCALC, TRIG, CHOLHDL, LDLDIRECT in the last 72  hours. Thyroid Function Tests: No results for input(s): TSH, T4TOTAL, FREET4, T3FREE, THYROIDAB in the last 72 hours. Anemia Panel: No results for input(s): VITAMINB12, FOLATE, FERRITIN, TIBC, IRON, RETICCTPCT in the last 72 hours. Sepsis Labs: No results for input(s): PROCALCITON, LATICACIDVEN in the last 168 hours.  Recent Results (from the past 240 hour(s))  MRSA PCR Screening     Status: None   Collection Time: 07/04/18  3:29 AM  Result Value Ref Range Status   MRSA by PCR NEGATIVE NEGATIVE Final    Comment:        The GeneXpert MRSA Assay (FDA approved for NASAL specimens only), is one component of a comprehensive MRSA colonization surveillance program. It is not intended to diagnose MRSA infection nor to guide or monitor treatment for MRSA infections. Performed at [facility], [address], [place], [place] [address]      Radiology Studies: No results found.  Scheduled Meds: . amLODipine  10 mg Oral Daily  . aspirin  81 mg Oral Daily  . chlorhexidine  15 mL Mouth Rinse BID  . Chlorhexidine Gluconate Cloth  6 each Topical Q0600  . Chlorhexidine Gluconate Cloth  6 each Topical Q0600  . Chlorhexidine Gluconate Cloth  6 each Topical Q0600  . darbepoetin (ARANESP) injection - DIALYSIS  150 mcg Intravenous Q Tue-HD  . feeding supplement (NEPRO CARB STEADY)  237 mL Oral BID BM  . feeding supplement (PRO-STAT SUGAR FREE 64)  30 mL Oral BID  . heparin  5,000 Units Subcutaneous Q8H  . insulin aspart  0-5 Units Subcutaneous QHS  . insulin aspart  0-9 Units Subcutaneous TID WC  . insulin detemir  10 Units Subcutaneous QHS  . levothyroxine  50 mcg Per Tube Q0600  . lisinopril  5 mg Oral Daily  . mouth rinse  15 mL Mouth Rinse BID  . metoprolol tartrate  75 mg Oral BID  . multivitamin  1 tablet Oral QHS  . pantoprazole  40 mg Oral Daily  . sodium chloride flush  3 mL Intravenous Q12H   Continuous Infusions: . sodium chloride 10 mL/hr at 07/04/18 1800  . ferric  gluconate (FERRLECIT/NULECIT) IV Stopped (07/03/18 1257)  . sodium chloride 250 mL (06/28/18 1511)     LOS: 11 days   Rickey Barbara, MD [facility] Pager On Amion  If 7PM-7AM, please contact night-coverage  07/05/2018, 12:38 PM

## 2018-07-05 NOTE — Progress Notes (Signed)
Progress Note  Patient Name: Alexander Whitaker Date of Encounter: 07/05/2018  Primary Cardiologist: Dr. Ellyn Hack  Subjective   No chest pain;  Had a 5 beat run of NSVT 4/8 pm at 21:14 with ventriclar rate ~ 150.  He had pulled out his iv's  Inpatient Medications    Scheduled Meds: . amLODipine  10 mg Oral Daily  . aspirin  81 mg Oral Daily  . chlorhexidine  15 mL Mouth Rinse BID  . Chlorhexidine Gluconate Cloth  6 each Topical Q0600  . Chlorhexidine Gluconate Cloth  6 each Topical Q0600  . Chlorhexidine Gluconate Cloth  6 each Topical Q0600  . darbepoetin (ARANESP) injection - DIALYSIS  150 mcg Intravenous Q Tue-HD  . feeding supplement (NEPRO CARB STEADY)  237 mL Oral BID BM  . feeding supplement (PRO-STAT SUGAR FREE 64)  30 mL Oral BID  . heparin  5,000 Units Subcutaneous Q8H  . insulin aspart  0-5 Units Subcutaneous QHS  . insulin aspart  0-9 Units Subcutaneous TID WC  . levothyroxine  50 mcg Per Tube Q0600  . lisinopril  5 mg Oral Daily  . mouth rinse  15 mL Mouth Rinse BID  . metoprolol tartrate  50 mg Oral BID  . multivitamin  1 tablet Oral QHS  . pantoprazole  40 mg Oral Daily  . sodium chloride flush  3 mL Intravenous Q12H   Continuous Infusions: . sodium chloride 10 mL/hr at 07/04/18 1800  . ferric gluconate (FERRLECIT/NULECIT) IV Stopped (07/03/18 1257)  . sodium chloride 250 mL (06/28/18 1511)   PRN Meds: sodium chloride, acetaminophen, hydrALAZINE, sodium chloride, sodium chloride flush   Vital Signs    Vitals:   07/05/18 0638 07/05/18 0646 07/05/18 0700 07/05/18 0800  BP:   (!) 141/61 (!) 160/58  Pulse:   78 74  Resp:   20 19  Temp:  98.1 F (36.7 C)    TempSrc:  Oral    SpO2:   98% 96%  Weight: 67.3 kg     Height:        Intake/Output Summary (Last 24 hours) at 07/05/2018 0853 Last data filed at 07/04/2018 2326 Gross per 24 hour  Intake 446.25 ml  Output -  Net 446.25 ml    I/O since admission: +46  Filed Weights   07/03/18 0800 07/03/18  1220 07/05/18 0638  Weight: 70.4 kg 66.9 kg 67.3 kg    Telemetry    Currently NSR at 71;  5 beat run of NSVT 4/8/pm at 21:14 with ventriclar rate ~ 150.     ECG    ECG (independently read by me): NSR at 72; LAD Diffuse marked T wave inversion ; Increaed QTc 586 msec, but improved.      ECG (independently read by me): Post recurrent torsade arrest: Normal sinus rhythm at 79, left axis deviation, LVH, QTc interval 616.  Previously noted T wave inversions V3 through V6 and mildly in 1 and aVL.  Physical Exam   BP (!) 160/58   Pulse 74   Temp 98.1 F (36.7 C) (Oral)   Resp 19   Ht $R'5\' 5"'ql$  (1.651 m)   Wt 67.3 kg   SpO2 96%   BMI 24.69 kg/m  General: Appears comfortable;  Not well oriented;  Year 2002, [employer] [place] Skin: normal turgor, no rashes, warm and dry HEENT: Normocephalic, atraumatic. Pupils equal round and reactive to light; sclera anicteric; extraocular muscles intact;  Nose without nasal septal hypertrophy Mouth/Parynx benign; Neck: No JVD, no  carotid bruits; normal carotid upstroke Lungs: no wheezing or rales Chest wall: without tenderness to palpitation Heart: PMI not displaced, RRR, s1 s2 normal, 1/6 systolic murmur, no diastolic murmur, no rubs, gallops, thrills, or heaves Abdomen: soft, nontender; no hepatosplenomehaly, BS+; abdominal aorta nontender and not dilated by palpation. Back: no CVA tenderness Pulses 2+ Musculoskeletal: full range of motion, normal strength, no joint deformities Extremities: no clubbing cyanosis or edema, Homan's sign negative  Neurologic: grossly nonfocal; Cranial nerves grossly wnl Psychologic: pleasant affect   Labs    Chemistry Recent Labs  Lab 06/30/18 0320 07/02/18 0313 07/03/18 0649 07/03/18 1650 07/04/18 0204 07/04/18 2251  NA 138 139 142 139 138  --   K 3.6 3.4* 3.3* 3.7 3.7 3.7  CL 102 103 104 99 100  --   CO2 24 23 21* 26 23  --   GLUCOSE 234* 130* 190* 240* 217*  --   BUN 34* 29* 38* 15 20  --   CREATININE  4.23* 4.95* 6.30* 3.05* 4.05*  --   CALCIUM 7.4* 7.8* 7.6* 8.1* 8.5*  --   ALBUMIN 2.1* 2.3* 2.4*  --   --   --   GFRNONAA 13* 11* 8* 20* 14*  --   GFRAA 15* 13* 10* 23* 16*  --   ANIONGAP 12 13 17* 14 15  --      Hematology Recent Labs  Lab 07/02/18 0313 07/03/18 0649 07/04/18 0204  WBC 4.7 4.7 8.4  RBC 3.10* 3.16* 3.77*  HGB 9.1* 9.2* 11.4*  HCT 28.8* 29.7* 35.7*  MCV 92.9 94.0 94.7  MCH 29.4 29.1 30.2  MCHC 31.6 31.0 31.9  RDW 14.4 14.4 14.4  PLT 179 221 332    Cardiac EnzymesNo results for input(s): TROPONINI in the last 168 hours. No results for input(s): TROPIPOC in the last 168 hours.   BNPNo results for input(s): BNP, PROBNP in the last 168 hours.   DDimer No results for input(s): DDIMER in the last 168 hours.   Lipid Panel     Component Value Date/Time   CHOL 183 11/22/2017 0322   TRIG 173 (H) 06/30/2018 0320   HDL 27 (L) 11/22/2017 0322   CHOLHDL 6.8 11/22/2017 0322   VLDL 57 (H) 11/22/2017 0322   LDLCALC 99 11/22/2017 0322     Radiology    No results found.  Cardiac Studies   Cath: 06/27/18   Prox RCA lesion is 30% stenosed.  Mid RCA-1 lesion is 20% stenosed.  Mid RCA-2 lesion is 30% stenosed.  Ost LM to Mid LM lesion is 5% stenosed.  Mid LAD lesion is 10% stenosed.  There is moderate left ventricular systolic dysfunction.  Moderate LV dysfunction with an EF estimated 40% with diffuse hypocontractility. LVEDP 13 mm.  Mild nonobstructive CAD with smooth 5% narrowing in a long large left main coronary artery which trifurcated into a large LAD, ramus intermediate, and left circumflex vessel. There was mild 10% smooth narrowing in the distal LAD.   Large dominant RCA with 3020 and 30% proximal to mid and mid distal narrowings.  RECOMMENDATION: Medical therapy for nonischemic cardiomyopathy. Present the patient continues to be on epinephrine, Neo-Synephrine, amiodarone. Plan dialysis as scheduled tomorrow.   ECHO  IMPRESSIONS04/04/2018 1. The left ventricle has normal systolic function, with an ejection fraction of 60-65%. The cavity size was normal. There is mildly increased left ventricular wall thickness. No evidence of left ventricular regional wall motion abnormalities. 2. The tricuspid valve was grossly normal. Tricuspid valve regurgitation is  mild-moderate. 3. The aortic valve is tricuspid.  Patient Profile     68 y.o. male admitted with fever and sob, subsequently develoed PMVT in the setting of long QT in the setting of QT prolonging drugs.    Assessment & Plan    1.  Recurrent polymorphic ventricular tachycardia/torsade in the early morning of 4/8 defibrillated x1 with restoration of sinus rhythm.  Of note, the patient had received a dose of Compazine shortly prior to the episode.  Last Magnesium 2.4 ; K 3.7 .  Short burst of NSVT at 21:14 pm 4/8  2.  Prolonged QTc interval.  Previous event may have been worsened by azithromycin and possible amiodarone.   Recurrent PMTearly am today  most likely contributed by a PRN dose of Compazine which apparently was ordered yesterday due to the patient's complaints of nausea.  Yesterday, since his resting heart rate was in the 90s, I further titrated metoprolol to 50 mg every 12 hours which she had tolerated.  He has been on metoprolol 50 mg twice a day magnesium and potassium are stable.  Must avoid all QTc prolonging drugs.  Suspect most recent polymorphic VT episode related to Compazine resulting in further QTC prolongation. Now off lidocaine.  QTc today improved to 586 msec, but still with marked T wave inversion diffusely.  Will increase metoprolol to 75 mg bid today.  3.  Nonischemic cardiomyopathy with EF at cath estimated at 40% with diffuse hypocontractility.  Mild nonobstructive CAD.   4.  Stage renal disease on hemodialysis with dialysis on Tuesday Thursday and Saturday.  For dialysis later today.  5. Diabetes: Elevated glucose > 200, diabetes  coordinator saw pt yesterday;  Will add levimer per recommendations.   6. Hypothyroidism on levothyroxine.  7 Altered mental status; pleasantly alert, but 2002 and [place] [employer].  Signed, Troy Sine, MD, [facility] 07/05/2018, 8:53 AM

## 2018-07-05 NOTE — Progress Notes (Signed)
[facility] ROUNDING NOTE   Subjective:   This is a 68 year old gentleman with a history of community-acquired pneumonia and sepsis and pulmonary infiltrates.  He required intubation.  He also had a V. tach cardiac arrest with CPR lasted about 5 minutes and IV amiodarone 06/27/2018.  He was intubated and extubated on 06/30/2018.  He received CVVHD 06/23/2020 06/26/2018.  Events noted for the early morning of 07/04/2018 patient developed torsades and became unresponsive.  He received chest compressions for 3 minutes and was transferred to the cardiac care unit.  He received magnesium and potassium.  Appreciate assistance from Dr. Shelva Majestic.  Blood pressure 160/58 pulse 73 temperature 98.1  Dialysis 07/03/2018      3 L removed   dialysis planned for 07/05/2018  Amlodipine 10 mg daily, aspirin 81 mg daily metoprolol 50 mg twice daily levothyroxine 50 mcg daily Protonix 40 mg daily Aranesp 150 mcg every 2 weeks, IV Nulicit load, lisinopril 5 mg daily  Sodium 138 potassium 3.7 chloride 100 CO2 23 BUN 20 creatinine 4.05 glucose 217 calcium 8.5 magnesium 2.0 WBC 8.4 hemoglobin 11.4 platelets 332  Chest x-ray showed improving pulmonary edema.    Objective:  Vital signs in last 24 hours:  Temp:  [97.9 F (36.6 C)-98.7 F (37.1 C)] 98.1 F (36.7 C) (04/09 0646) Pulse Rate:  [64-85] 78 (04/09 0700) Resp:  [18-24] 20 (04/09 0700) BP: (117-154)/(42-67) 141/61 (04/09 0700) SpO2:  [94 %-100 %] 98 % (04/09 0700) Weight:  [67.3 kg] 67.3 kg (04/09 0638)  Weight change: -3.1 kg Filed Weights   07/03/18 0800 07/03/18 1220 07/05/18 0638  Weight: 70.4 kg 66.9 kg 67.3 kg    Intake/Output: I/O last 3 completed shifts: In: 764.5 [P.O.:240; I.V.:322.5; IV Piggyback:202] Out: -    Intake/Output this shift:  No intake/output data recorded. Awake alert oriented no distress CVS- RRR RS- CTA no wheezes or rales ABD- BS present soft non-distended EXT- no edema AV fistula positive  bruit  [place] TTS  4 hours 76.5 kg   2/2.25 bath   no heparin   right upper arm AV fistula 400/800 Micera 200 mcg every 2 weeks Venofer 50 mg weekly    Basic Metabolic Panel: Recent Labs  Lab 06/29/18 0518 06/30/18 0320 07/02/18 0313 07/03/18 0649 07/03/18 1650 07/04/18 0204 07/04/18 2251  NA 143 138 139 142 139 138  --   K 3.3* 3.6 3.4* 3.3* 3.7 3.7 3.7  CL 109 102 103 104 99 100  --   CO2 21* 24 23 21* 26 23  --   GLUCOSE 177* 234* 130* 190* 240* 217*  --   BUN 49* 34* 29* 38* 15 20  --   CREATININE 7.44* 4.23* 4.95* 6.30* 3.05* 4.05*  --   CALCIUM 7.6* 7.4* 7.8* 7.6* 8.1* 8.5*  --   MG 2.4 1.9 2.0  --   --  2.0 2.4  PHOS  --  3.1 3.9 4.7*  --   --   --     Liver Function Tests: Recent Labs  Lab 06/30/18 0320 07/02/18 0313 07/03/18 0649  ALBUMIN 2.1* 2.3* 2.4*   No results for input(s): LIPASE, AMYLASE in the last 168 hours. No results for input(s): AMMONIA in the last 168 hours.  CBC: Recent Labs  Lab 06/30/18 0320 07/01/18 0227 07/02/18 0313 07/03/18 0649 07/04/18 0204  WBC 5.9 5.8 4.7 4.7 8.4  HGB 8.3* 9.3* 9.1* 9.2* 11.4*  HCT 27.4* 30.1* 28.8* 29.7* 35.7*  MCV 96.1 95.6 92.9 94.0 94.7  PLT 119* 133* 179 221 332    Cardiac Enzymes: No results for input(s): CKTOTAL, CKMB, CKMBINDEX, TROPONINI in the last 168 hours.  BNP: Invalid input(s): POCBNP  CBG: Recent Labs  Lab 07/04/18 0749 07/04/18 1121 07/04/18 1532 07/04/18 2314 07/05/18 0648  GLUCAP 227* 186* 173* 242* 48*    Microbiology: Results for orders placed or performed during the hospital encounter of 06/24/18  MRSA PCR Screening     Status: None   Collection Time: 06/24/18  4:12 AM  Result Value Ref Range Status   MRSA by PCR NEGATIVE NEGATIVE Final    Comment:        The GeneXpert MRSA Assay (FDA approved for NASAL specimens only), is one component of a comprehensive MRSA colonization surveillance program. It is not intended to diagnose MRSA infection nor to guide  or monitor treatment for MRSA infections. Performed at [facility], [place] [address] [place], [place] [address]   Culture, blood (routine x 2) Call MD if unable to obtain prior to antibiotics being given     Status: None   Collection Time: 06/24/18  6:39 AM  Result Value Ref Range Status   Specimen Description BLOOD A-LINE DRAW  Final   Special Requests   Final    BOTTLES DRAWN AEROBIC AND ANAEROBIC Blood Culture adequate volume   Culture   Final    NO GROWTH 5 DAYS Performed at [facility], [place] [address], [place], [place] [address]    Report Status 06/29/2018 FINAL  Final  Culture, respiratory (non-expectorated)     Status: None   Collection Time: 06/24/18  7:35 AM  Result Value Ref Range Status   Specimen Description TRACHEAL ASPIRATE  Final   Special Requests NONE  Final   Gram Stain   Final    MODERATE WBC PRESENT, PREDOMINANTLY PMN RARE BUDDING YEAST SEEN Performed at [facility], [place] [address] [place], [place] [address]    Culture ABUNDANT CANDIDA Adrian  Final   Report Status 06/27/2018 FINAL  Final  Culture, blood (routine x 2)     Status: None   Collection Time: 06/24/18  8:01 AM  Result Value Ref Range Status   Specimen Description BLOOD CENTRAL LINE  Final   Special Requests   Final    BOTTLES DRAWN AEROBIC AND ANAEROBIC Blood Culture adequate volume   Culture   Final    NO GROWTH 5 DAYS Performed at [facility], [place] [address], [place], [place] [address]    Report Status 06/29/2018 FINAL  Final  MRSA PCR Screening     Status: None   Collection Time: 07/04/18  3:29 AM  Result Value Ref Range Status   MRSA by PCR NEGATIVE NEGATIVE Final    Comment:        The GeneXpert MRSA Assay (FDA approved for NASAL specimens only), is one component of a comprehensive MRSA colonization surveillance program. It is not intended to diagnose MRSA infection nor to guide or monitor treatment for MRSA infections. Performed at [facility], [place] [address] [place],  [address]     Coagulation Studies: No results for input(s): LABPROT, INR in the last 72 hours.  Urinalysis: No results for input(s): COLORURINE, LABSPEC, PHURINE, GLUCOSEU, HGBUR, BILIRUBINUR, KETONESUR, PROTEINUR, UROBILINOGEN, NITRITE, LEUKOCYTESUR in the last 72 hours.  Invalid input(s): APPERANCEUR    Imaging: No results found.   Medications:   . sodium chloride 10 mL/hr at 07/04/18 1800  . ferric  gluconate (FERRLECIT/NULECIT) IV Stopped (07/03/18 1257)  . sodium chloride 250 mL (06/28/18 1511)   . amLODipine  10 mg Oral Daily  . aspirin  81 mg Oral Daily  . chlorhexidine  15 mL Mouth Rinse BID  . Chlorhexidine Gluconate Cloth  6 each Topical Q0600  . Chlorhexidine Gluconate Cloth  6 each Topical Q0600  . Chlorhexidine Gluconate Cloth  6 each Topical Q0600  . darbepoetin (ARANESP) injection - DIALYSIS  150 mcg Intravenous Q Tue-HD  . feeding supplement (NEPRO CARB STEADY)  237 mL Oral BID BM  . feeding supplement (PRO-STAT SUGAR FREE 64)  30 mL Oral BID  . heparin  5,000 Units Subcutaneous Q8H  . insulin aspart  0-5 Units Subcutaneous QHS  . insulin aspart  0-9 Units Subcutaneous TID WC  . levothyroxine  50 mcg Per Tube Q0600  . lisinopril  5 mg Oral Daily  . mouth rinse  15 mL Mouth Rinse BID  . metoprolol tartrate  50 mg Oral BID  . multivitamin  1 tablet Oral QHS  . pantoprazole  40 mg Oral Daily  . sodium chloride flush  3 mL Intravenous Q12H   sodium chloride, acetaminophen, hydrALAZINE, sodium chloride, sodium chloride flush  Assessment/ Plan:   Community acquired pneumonia.  Intubated now extubated.  COVID-19 test negative.  Receiving IV antibiotics.  It appears her last dose was 06/30/2018 of Rocephin..  Azithromycin administered 06/23/2020 -  06/28/2018 chest x-ray improved.  But decreasing oxygen requirements.  End-stage renal disease receiving dialysis Tuesday Thursday Saturday next treatment 07/05/2018  VT cardiac  arrest 06/27/2018.  Appreciate assistance from Dr. Claiborne Billings and Dr. Lovena Le.  Patient with polymorphic ventricular tachycardia related possibly to QTC prolonging drugs including Compazine azithromycin and amiodarone.  Ventilator dependent respiratory failure extubated 06/30/2018  Hypertension/volume appears to be doing well will continue to challenge estimated dry weight encourage compliance with dialysis.  Have added lisinopril 5 mg nightly.  Increase dose of metoprolol and patient appears to have a better blood pressure.  anemia iron low T sats 10% started IV iron loading, darbepoetin to be administered 150 mcg 07/03/2018  Bones.  No binders at this present time  Nutrition stable at this present time we will continue to follow consider adding Protostat and vitamins  Diabetes mellitus sliding scale  Hypothyroidism on replacement therapy  Congestive heart failure nonischemic cardiomyopathy with an ejection fraction estimated at catheter 40%   LOS: [place] $RemoveBefore'@TODAY'[email or web address]$ $RemoveB'@8'eDzzBeZA$ :07 AM

## 2018-07-06 ENCOUNTER — Inpatient Hospital Stay (HOSPITAL_COMMUNITY): Payer: Medicare HMO

## 2018-07-06 DIAGNOSIS — Z452 Encounter for adjustment and management of vascular access device: Secondary | ICD-10-CM

## 2018-07-06 DIAGNOSIS — R9431 Abnormal electrocardiogram [ECG] [EKG]: Secondary | ICD-10-CM

## 2018-07-06 LAB — ECHOCARDIOGRAM COMPLETE
Height: 65 in
Weight: 2292.78 oz

## 2018-07-06 LAB — BASIC METABOLIC PANEL
Anion gap: 16 — ABNORMAL HIGH (ref 5–15)
BUN: 21 mg/dL (ref 8–23)
CO2: 25 mmol/L (ref 22–32)
Calcium: 8.4 mg/dL — ABNORMAL LOW (ref 8.9–10.3)
Chloride: 97 mmol/L — ABNORMAL LOW (ref 98–111)
Creatinine, Ser: 3.93 mg/dL — ABNORMAL HIGH (ref 0.61–1.24)
GFR calc Af Amer: 17 mL/min — ABNORMAL LOW (ref >60)
GFR calc non Af Amer: 15 mL/min — ABNORMAL LOW (ref >60)
Glucose, Bld: 218 mg/dL — ABNORMAL HIGH (ref 70–99)
Potassium: 3.8 mmol/L (ref 3.5–5.1)
Sodium: 138 mmol/L (ref 135–145)

## 2018-07-06 LAB — GLUCOSE, CAPILLARY
Glucose-Capillary: 209 mg/dL — ABNORMAL HIGH (ref 70–99)
Glucose-Capillary: 175 mg/dL — ABNORMAL HIGH (ref 70–99)
Glucose-Capillary: 219 mg/dL — ABNORMAL HIGH (ref 70–99)

## 2018-07-06 LAB — MAGNESIUM: Magnesium: 2 mg/dL (ref 1.7–2.4)

## 2018-07-06 MED ORDER — PERFLUTREN LIPID MICROSPHERE
1.0000 mL | INTRAVENOUS | Status: AC | PRN
  Administered 2018-07-06: 12:00:00 2 mL via INTRAVENOUS
  Filled 2018-07-06: qty 10

## 2018-07-06 MED ORDER — METOCLOPRAMIDE HCL 10 MG PO TABS: 5.0000 mg | ORAL_TABLET | Freq: Four times a day (QID) | ORAL | Status: DC | PRN

## 2018-07-06 MED ORDER — METOCLOPRAMIDE HCL 10 MG PO TABS: 5.0000 mg | ORAL_TABLET | Freq: Three times a day (TID) | ORAL | Status: DC | PRN

## 2018-07-06 MED ORDER — PANTOPRAZOLE SODIUM 40 MG IV SOLR
40.0000 mg | Freq: Every day | INTRAVENOUS | Status: DC
  Administered 2018-07-06 – 2018-07-08 (×3): 40 mg via INTRAVENOUS
  Filled 2018-07-06 (×3): qty 40

## 2018-07-06 MED ORDER — PERFLUTREN LIPID MICROSPHERE
INTRAVENOUS | Status: AC
  Administered 2018-07-06: 2 mL via INTRAVENOUS
  Filled 2018-07-06: qty 10

## 2018-07-06 MED ORDER — NICOTINE 14 MG/24HR TD PT24
14.0000 mg | MEDICATED_PATCH | Freq: Every day | TRANSDERMAL | Status: DC
  Administered 2018-07-06 – 2018-07-09 (×4): 14 mg via TRANSDERMAL
  Filled 2018-07-06 (×4): qty 1

## 2018-07-06 NOTE — Progress Notes (Signed)
[facility] ROUNDING NOTE   Subjective:   This is a 68 year old gentleman with a history of community-acquired pneumonia and sepsis and pulmonary infiltrates.  He required intubation.  He also had a V. tach cardiac arrest with CPR lasted about 5 minutes and IV amiodarone 06/27/2018.  He was intubated and extubated on 06/30/2018.  He received CVVHD 06/23/2020 06/26/2018.  Events noted for the early morning of 07/04/2018 patient developed torsades and became unresponsive.  He received chest compressions for 3 minutes and was transferred to the cardiac care unit.  He received magnesium and potassium.  Appreciate assistance from Dr. Shelva Majestic.  Blood pressure 135/63 pulse 77 temperature 98.1 O2 sats 97% room air  Dialysis 07/03/2018      3 L removed   dialysis planned for 07/05/2018  Amlodipine 10 mg daily, aspirin 81 mg daily metoprolol 50 mg twice daily levothyroxine 50 mcg daily Protonix 40 mg daily Aranesp 150 mcg every 2 weeks, IV Nulicit load, lisinopril 5 mg daily  Sodium 138 potassium 3.8 chloride 97 CO2 25 BUN 21 creatinine 3.93 glucose 218 calcium 8.4 magnesium 2.0  Chest x-ray showed improving pulmonary edema.    Objective:  Vital signs in last 24 hours:  Temp:  [97.4 F (36.3 C)-98.6 F (37 C)] 98.1 F (36.7 C) (04/10 0700) Pulse Rate:  [66-84] 77 (04/10 0800) Resp:  [12-23] 13 (04/10 0800) BP: (65-173)/(41-72) 135/63 (04/10 0800) SpO2:  [95 %-98 %] 98 % (04/10 0800) Weight:  [65 kg-67.3 kg] 65 kg (04/10 0616)  Weight change: 0 kg Filed Weights   07/05/18 1426 07/05/18 1836 07/06/18 0616  Weight: 67.3 kg 65.3 kg 65 kg    Intake/Output: I/O last 3 completed shifts: In: 360 [P.O.:360] Out: 2060 [Urine:60; Other:2000]   Intake/Output this shift:  Total I/O In: 42 [P.O.:60] Out: -  Awake alert oriented no distress CVS- RRR RS- CTA no wheezes or rales ABD- BS present soft non-distended EXT- no edema AV fistula positive bruit  [facility]  4 hours 76.5 kg    2/2.25 bath   no heparin   right upper arm AV fistula 400/800 Micera 200 mcg every 2 weeks Venofer 50 mg weekly    Basic Metabolic Panel: Recent Labs  Lab 06/30/18 0320 07/02/18 0313 07/03/18 0649 07/03/18 1650 07/04/18 0204 07/04/18 2251 07/06/18 0409  NA 138 139 142 139 138  --  138  K 3.6 3.4* 3.3* 3.7 3.7 3.7 3.8  CL 102 103 104 99 100  --  97*  CO2 24 23 21* 26 23  --  25  GLUCOSE 234* 130* 190* 240* 217*  --  218*  BUN 34* 29* 38* 15 20  --  21  CREATININE 4.23* 4.95* 6.30* 3.05* 4.05*  --  3.93*  CALCIUM 7.4* 7.8* 7.6* 8.1* 8.5*  --  8.4*  MG 1.9 2.0  --   --  2.0 2.4 2.0  PHOS 3.1 3.9 4.7*  --   --   --   --     Liver Function Tests: Recent Labs  Lab 06/30/18 0320 07/02/18 0313 07/03/18 0649  ALBUMIN 2.1* 2.3* 2.4*   No results for input(s): LIPASE, AMYLASE in the last 168 hours. No results for input(s): AMMONIA in the last 168 hours.  CBC: Recent Labs  Lab 06/30/18 0320 07/01/18 0227 07/02/18 0313 07/03/18 0649 07/04/18 0204  WBC 5.9 5.8 4.7 4.7 8.4  HGB 8.3* 9.3* 9.1* 9.2* 11.4*  HCT 27.4* 30.1* 28.8* 29.7* 35.7*  MCV 96.1 95.6 92.9  94.0 94.7  PLT 119* 133* 179 221 332    Cardiac Enzymes: No results for input(s): CKTOTAL, CKMB, CKMBINDEX, TROPONINI in the last 168 hours.  BNP: Invalid input(s): POCBNP  CBG: Recent Labs  Lab 07/05/18 0648 07/05/18 1107 07/05/18 1641 07/05/18 2147 07/06/18 0657  GLUCAP 226* 182* 113* 193* 42*    Microbiology: Results for orders placed or performed during the hospital encounter of 06/24/18  MRSA PCR Screening     Status: None   Collection Time: 06/24/18  4:12 AM  Result Value Ref Range Status   MRSA by PCR NEGATIVE NEGATIVE Final    Comment:        The GeneXpert MRSA Assay (FDA approved for NASAL specimens only), is one component of a comprehensive MRSA colonization surveillance program. It is not intended to diagnose MRSA infection nor to guide or monitor treatment for MRSA  infections. Performed at [facility], [place] [address] [place], [place] [address]   Culture, blood (routine x 2) Call MD if unable to obtain prior to antibiotics being given     Status: None   Collection Time: 06/24/18  6:39 AM  Result Value Ref Range Status   Specimen Description BLOOD A-LINE DRAW  Final   Special Requests   Final    BOTTLES DRAWN AEROBIC AND ANAEROBIC Blood Culture adequate volume   Culture   Final    NO GROWTH 5 DAYS Performed at [facility], [place] [address] [place], [place] [address]    Report Status 06/29/2018 FINAL  Final  Culture, respiratory (non-expectorated)     Status: None   Collection Time: 06/24/18  7:35 AM  Result Value Ref Range Status   Specimen Description TRACHEAL ASPIRATE  Final   Special Requests NONE  Final   Gram Stain   Final    MODERATE WBC PRESENT, PREDOMINANTLY PMN RARE BUDDING YEAST SEEN Performed at [facility], [place] [address] [place], [place] [address]    Culture ABUNDANT CANDIDA [place]  Final   Report Status 06/27/2018 FINAL  Final  Culture, blood (routine x 2)     Status: None   Collection Time: 06/24/18  8:01 AM  Result Value Ref Range Status   Specimen Description BLOOD CENTRAL LINE  Final   Special Requests   Final    BOTTLES DRAWN AEROBIC AND ANAEROBIC Blood Culture adequate volume   Culture   Final    NO GROWTH 5 DAYS Performed at [facility], [place] [address], [place], [place] [address]    Report Status 06/29/2018 FINAL  Final  MRSA PCR Screening     Status: None   Collection Time: 07/04/18  3:29 AM  Result Value Ref Range Status   MRSA by PCR NEGATIVE NEGATIVE Final    Comment:        The GeneXpert MRSA Assay (FDA approved for NASAL specimens only), is one component of a comprehensive MRSA colonization surveillance program. It is not intended to diagnose MRSA infection nor to guide or monitor treatment for MRSA infections. Performed at [facility], [place] [address],  [place], [place] [address]     Coagulation Studies: No results for input(s): LABPROT, INR in the last 72 hours.  Urinalysis: No results for input(s): COLORURINE, LABSPEC, PHURINE, GLUCOSEU, HGBUR, BILIRUBINUR, KETONESUR, PROTEINUR, UROBILINOGEN, NITRITE, LEUKOCYTESUR in the last 72 hours.  Invalid input(s): APPERANCEUR    Imaging: No results found.   Medications:   . sodium chloride 10 mL/hr at 07/04/18 1800  .  ferric gluconate (FERRLECIT/NULECIT) IV Stopped (07/03/18 1257)  . sodium chloride 250 mL (06/28/18 1511)   . amLODipine  10 mg Oral Daily  . aspirin  81 mg Oral Daily  . chlorhexidine  15 mL Mouth Rinse BID  . Chlorhexidine Gluconate Cloth  6 each Topical Q0600  . Chlorhexidine Gluconate Cloth  6 each Topical Q0600  . Chlorhexidine Gluconate Cloth  6 each Topical Q0600  . darbepoetin (ARANESP) injection - DIALYSIS  150 mcg Intravenous Q Tue-HD  . feeding supplement (NEPRO CARB STEADY)  237 mL Oral BID BM  . feeding supplement (PRO-STAT SUGAR FREE 64)  30 mL Oral BID  . heparin  5,000 Units Subcutaneous Q8H  . insulin aspart  0-5 Units Subcutaneous QHS  . insulin aspart  0-9 Units Subcutaneous TID WC  . insulin detemir  10 Units Subcutaneous QHS  . levothyroxine  50 mcg Per Tube Q0600  . lisinopril  5 mg Oral Daily  . mouth rinse  15 mL Mouth Rinse BID  . metoprolol tartrate  75 mg Oral BID  . multivitamin  1 tablet Oral QHS  . pantoprazole  40 mg Oral Daily  . sodium chloride flush  3 mL Intravenous Q12H   sodium chloride, acetaminophen, hydrALAZINE, labetalol, sodium chloride, sodium chloride flush  Assessment/ Plan:   Community acquired pneumonia.  Intubated now extubated.  COVID-19 test negative.  Receiving IV antibiotics.  It appears her last dose was 06/30/2018 of Rocephin..  Azithromycin administered 06/23/2020 -  06/28/2018 chest x-ray improved.  But decreasing oxygen requirements.  End-stage renal disease receiving dialysis Tuesday Thursday Saturday next  treatment 07/05/2018  VT cardiac arrest 06/27/2018.  Appreciate assistance from Dr. Claiborne Billings and Dr. Lovena Le.  Patient with polymorphic ventricular tachycardia related possibly to QTC prolonging drugs including Compazine azithromycin and amiodarone.  Ventilator dependent respiratory failure extubated 06/30/2018  Hypertension/volume appears to be doing well will continue to challenge estimated dry weight encourage compliance with dialysis.  Appears to be tolerating his lisinopril well.  Increased dose of metoprolol and patient appears to have a better blood pressure.  anemia iron low T sats 10% started IV iron loading, darbepoetin to be administered 150 mcg 07/03/2018  Bones.  No binders at this present time  Nutrition stable at this present time we will continue to follow consider adding Protostat and vitamins  Diabetes mellitus sliding scale  Hypothyroidism on replacement therapy  Congestive heart failure nonischemic cardiomyopathy with an ejection fraction estimated at catheter 40%   LOS: [place] $RemoveBeforeD'@TODAY'NpsXsoBlEovOCe$ $RemoveBe'@8'WvhRbPaOi$ :54 AM

## 2018-07-06 NOTE — Progress Notes (Signed)
Progress Note  Patient Name: Alexander Whitaker Date of Encounter: 07/06/2018  Primary Cardiologist: Dr. Ellyn Hack  Subjective   Feels well but remains confused;  2002, Maudie Flakes president; time closer to Christmas  Inpatient Medications    Scheduled Meds: . amLODipine  10 mg Oral Daily  . aspirin  81 mg Oral Daily  . chlorhexidine  15 mL Mouth Rinse BID  . Chlorhexidine Gluconate Cloth  6 each Topical Q0600  . Chlorhexidine Gluconate Cloth  6 each Topical Q0600  . Chlorhexidine Gluconate Cloth  6 each Topical Q0600  . darbepoetin (ARANESP) injection - DIALYSIS  150 mcg Intravenous Q Tue-HD  . feeding supplement (NEPRO CARB STEADY)  237 mL Oral BID BM  . feeding supplement (PRO-STAT SUGAR FREE 64)  30 mL Oral BID  . heparin  5,000 Units Subcutaneous Q8H  . insulin aspart  0-5 Units Subcutaneous QHS  . insulin aspart  0-9 Units Subcutaneous TID WC  . insulin detemir  10 Units Subcutaneous QHS  . levothyroxine  50 mcg Per Tube Q0600  . lisinopril  5 mg Oral Daily  . mouth rinse  15 mL Mouth Rinse BID  . metoprolol tartrate  75 mg Oral BID  . multivitamin  1 tablet Oral QHS  . nicotine  14 mg Transdermal Daily  . pantoprazole (PROTONIX) IV  40 mg Intravenous Daily  . sodium chloride flush  3 mL Intravenous Q12H   Continuous Infusions: . sodium chloride 10 mL/hr at 07/04/18 1800  . ferric gluconate (FERRLECIT/NULECIT) IV Stopped (07/03/18 1257)  . sodium chloride 250 mL (06/28/18 1511)   PRN Meds: sodium chloride, acetaminophen, hydrALAZINE, labetalol, metoCLOPramide, sodium chloride, sodium chloride flush   Vital Signs    Vitals:   07/06/18 0600 07/06/18 0616 07/06/18 0700 07/06/18 0800  BP: (!) 139/57  (!) 150/62 135/63  Pulse: 68  71 77  Resp: (!) 21  (!) 22 13  Temp:   98.1 F (36.7 C)   TempSrc:   Oral   SpO2: 96%  96% 98%  Weight:  65 kg    Height:        Intake/Output Summary (Last 24 hours) at 07/06/2018 0936 Last data filed at 07/06/2018 0800 Gross per  24 hour  Intake 120 ml  Output 2060 ml  Net -1940 ml    I/O since admission: +46  Filed Weights   07/05/18 1426 07/05/18 1836 07/06/18 0616  Weight: 67.3 kg 65.3 kg 65 kg    Telemetry    Currently NSR at 71;  5 beat run of NSVT 4/8/pm at 21:14 with ventriclar rate ~ 150.     ECG    07/06/2018 ECG (independently read by me): Normal sinus rhythm at 75 bpm.  LVH.  QTc interval  587 ms; persistent marked T wave inversion V2 through V6 and mild T wave inversion 1 and aVL.  07/05/2018 ECG (independently read by me): NSR at 72; LAD Diffuse marked T wave inversion ; Increaed QTc 586 msec, but improved.      07/04/2018 ECG (independently read by me): Post recurrent torsade arrest: Normal sinus rhythm at 79, left axis deviation, LVH, QTc interval 616.  Previously noted T wave inversions V3 through V6 and mildly in 1 and aVL.  Physical Exam    BP 135/63 (BP Location: Left Arm)   Pulse 77   Temp 98.1 F (36.7 C) (Oral)   Resp 13   Ht $R'5\' 5"'GV$  (1.651 m)   Wt 65 kg   SpO2  98%   BMI 23.85 kg/m  General: No acute distress, not oriented with the year 2002, current president is Maudie Flakes, when asked about what month is it or what holiday or we closest to his response was Christmas Skin: normal turgor, no rashes, warm and dry HEENT: Normocephalic, atraumatic. Pupils equal round and reactive to light; sclera anicteric; extraocular muscles intact;  Nose without nasal septal hypertrophy Mouth/Parynx benign; Neck: No JVD, no carotid bruits; normal carotid upstroke Lungs: clear to ausculatation and percussion; no wheezing or rales Chest wall: without tenderness to palpitation Heart: PMI not displaced, RRR, s1 s2 normal, 1/6 systolic murmur, no diastolic murmur, no rubs, gallops, thrills, or heaves Abdomen: soft, nontender; no hepatosplenomehaly, BS+; abdominal aorta nontender and not dilated by palpation. Back: no CVA tenderness Pulses 2+ Musculoskeletal: no joint deformities Extremities: no  clubbing cyanosis or edema, Homan's sign negative  Neurologic: grossly nonfocal; Cranial nerves grossly wnl Psychologic: Pleasant mood and affect   Labs    Chemistry Recent Labs  Lab 06/30/18 0320 07/02/18 0313 07/03/18 0649 07/03/18 1650 07/04/18 0204 07/04/18 2251 07/06/18 0409  NA 138 139 142 139 138  --  138  K 3.6 3.4* 3.3* 3.7 3.7 3.7 3.8  CL 102 103 104 99 100  --  97*  CO2 24 23 21* 26 23  --  25  GLUCOSE 234* 130* 190* 240* 217*  --  218*  BUN 34* 29* 38* 15 20  --  21  CREATININE 4.23* 4.95* 6.30* 3.05* 4.05*  --  3.93*  CALCIUM 7.4* 7.8* 7.6* 8.1* 8.5*  --  8.4*  ALBUMIN 2.1* 2.3* 2.4*  --   --   --   --   GFRNONAA 13* 11* 8* 20* 14*  --  15*  GFRAA 15* 13* 10* 23* 16*  --  17*  ANIONGAP 12 13 17* 14 15  --  16*     Hematology Recent Labs  Lab 07/02/18 0313 07/03/18 0649 07/04/18 0204  WBC 4.7 4.7 8.4  RBC 3.10* 3.16* 3.77*  HGB 9.1* 9.2* 11.4*  HCT 28.8* 29.7* 35.7*  MCV 92.9 94.0 94.7  MCH 29.4 29.1 30.2  MCHC 31.6 31.0 31.9  RDW 14.4 14.4 14.4  PLT 179 221 332    Cardiac EnzymesNo results for input(s): TROPONINI in the last 168 hours. No results for input(s): TROPIPOC in the last 168 hours.   BNPNo results for input(s): BNP, PROBNP in the last 168 hours.   DDimer No results for input(s): DDIMER in the last 168 hours.   Lipid Panel     Component Value Date/Time   CHOL 183 11/22/2017 0322   TRIG 173 (H) 06/30/2018 0320   HDL 27 (L) 11/22/2017 0322   CHOLHDL 6.8 11/22/2017 0322   VLDL 57 (H) 11/22/2017 0322   LDLCALC 99 11/22/2017 0322     Radiology    No results found.  Cardiac Studies   Cath: 06/27/18   Prox RCA lesion is 30% stenosed.  Mid RCA-1 lesion is 20% stenosed.  Mid RCA-2 lesion is 30% stenosed.  Ost LM to Mid LM lesion is 5% stenosed.  Mid LAD lesion is 10% stenosed.  There is moderate left ventricular systolic dysfunction.  Moderate LV dysfunction with an EF estimated 40% with diffuse hypocontractility.  LVEDP 13 mm.  Mild nonobstructive CAD with smooth 5% narrowing in a long large left main coronary artery which trifurcated into a large LAD, ramus intermediate, and left circumflex vessel. There was mild 10% smooth narrowing in  the distal LAD.   Large dominant RCA with 3020 and 30% proximal to mid and mid distal narrowings.  RECOMMENDATION: Medical therapy for nonischemic cardiomyopathy. Present the patient continues to be on epinephrine, Neo-Synephrine, amiodarone. Plan dialysis as scheduled tomorrow.   ECHO IMPRESSIONS04/04/2018 1. The left ventricle has normal systolic function, with an ejection fraction of 60-65%. The cavity size was normal. There is mildly increased left ventricular wall thickness. No evidence of left ventricular regional wall motion abnormalities. 2. The tricuspid valve was grossly normal. Tricuspid valve regurgitation is mild-moderate. 3. The aortic valve is tricuspid.  Patient Profile     68 y.o. male admitted with fever and sob, subsequently develoed PMVT in the setting of long QT in the setting of QT prolonging drugs.    Assessment & Plan    1.  Recurrent polymorphic ventricular tachycardia/torsade in the early morning of 4/8 defibrillated x1 with restoration of sinus rhythm.  Of note, the patient had received a dose of Compazine shortly prior to the episode.  Last Magnesium 2.4 ; K 3.7 .  Short burst of NSVT at 21:14 pm 4/8.   2.  Prolonged QTc interval.  Previous event may have been worsened by azithromycin and possible amiodarone.   Recurrent PMTearly am today  most likely contributed by a PRN dose of Compazine which apparently was ordered yesterday due to the patient's complaints of nausea.  Since his resting heart rate was in the 90s, I further titrated metoprolol to 50 mg every 12 hours which she had tolerated.  Must avoid all QTc prolonging drugs.  Suspect most recent polymorphic VT episode related to Compazine resulting in further QTC  prolongation. Now off lidocaine.  QTc yesterday improved to 586 msec, but still with marked T wave inversion diffusely.  ECG today shows QTc interval 587 but this is variable on telemetry and has also been increasing.  We will continue with the present dose of metoprolol 75 mg twice a day today and plan to further titrate this to 100 mg twice a day tomorrow if blood pressure and heart rate allow.  Primary care had put an order for metoclopramide.  I would recommend this not be given due to slight potential for the prolongation of QT interval.     3.  Nonischemic cardiomyopathy with EF at cath estimated at 40% with diffuse hypocontractility.  Mild nonobstructive CAD.  Patient continues to have marked T wave abnormality across the precordium.  Will recheck echo Doppler study today and assess for possible apical hypertrophy  4.  Stage renal disease on hemodialysis with dialysis on Tuesday Thursday and Saturday.  Next dialysis was yesterday  5. Diabetes: Elevated glucose > 200, diabetes coordinator saw pt yesterday; the meter was added yesterday per recommendations.  6. Hypothyroidism on levothyroxine.  7 Altered mental status; pleasantly alert, but 2002,  [place], and near Christmas.  Signed, Troy Sine, MD, [facility] 07/06/2018, 9:36 AM

## 2018-07-06 NOTE — Progress Notes (Signed)
SLP Cancellation Note  Patient Details Name: Alexander Whitaker MRN: [id number] DOB: 05/06/1950   Cancelled treatment:       Reason Eval/Treat Not Completed: Patient having testing done in room. RN reports that he continues to have limited PO intake. Will f/u as able.   Venita Sheffield Laycie Schriner 07/06/2018, 11:11 AM  [name], M.A. [place] Acute Environmental education officer [phone] Office [phone]

## 2018-07-06 NOTE — Progress Notes (Signed)
  Echocardiogram 2D Echocardiogram has been performed.  Sladen Plancarte G Nevan Creighton 07/06/2018, 12:12 PM

## 2018-07-06 NOTE — Progress Notes (Signed)
PROGRESS NOTE    Alexander Whitaker  UVO:[id number] DOB: [birth year] DOA: 06/24/2018 PCP: [name], MD    Brief Narrative:  Alexander Whitaker 68 y/o with ESRD on HD, CVA, HTN, CHF sent from [facility] for fever 101.6 and obtunded state for which he was intubated.  CXR showed diffuse infiltrates- started on Vanc, Ceftriaxone and Zithro. Blood sugar of 638, HONK, started on Insulin infusion. Hypotensive, started on Levophed.   Admitted to ICU team.   Femoral aline 06/24/18 RIJ 06/24/18  3/31-NSTEMI-  Troponin 2.75- Cardiology consulted 4/1- A-fib with RVR started on Amio infusion- subsequently had ? Torsades vs VT- compression, defib x 1> ROSC 4/2- EP consulted- suspected QT (which was already prolonged) became more prolonged with Amio, Azithromycin and abnormal electrolytes 4/1- Cardiac cath > no significant ischemia 4/2 Limited ECHO- normal EF, no diastolic dysfunction- mild to mod TR 4/4 Extubated   Assessment & Plan:   Principal Problem:   Sepsis ([name]) Active Problems:   Acute diastolic congestive heart failure (HCC)   Diabetes mellitus, type 2 (HCC)   ESRD (end stage renal disease) ([name])   Essential hypertension   Respiratory failure with hypoxia (HCC)   Cardiac arrest with ventricular fibrillation (HCC)   Non-ST elevation (NSTEMI) myocardial infarction ([name])   History of CVA (cerebrovascular accident)   Acute on chronic combined systolic and diastolic CHF (congestive heart failure) (HCC)   Dysphagia   Anemia of chronic disease   Prolonged QT interval   Polymorphic ventricular tachycardia (HCC)   NSVT (nonsustained ventricular tachycardia) (HCC)   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 Given short course of lidocaine gtt - Brief episode of vtach overnight noted -Cardiology following - avoid QT prolonging meds -Pharmacy following    Acute diastolic congestive heart failure - CXR 4/3> Improving pulmonary edema pattern. Mild residual perihilar/infrahilar opacities. - fluid management with dialysis - Nephrology continues to follow  Dysphagia oropharyngeal phase after intubation - now on Dysphagia 2 diet as tolerated    Diabetes mellitus, type 2  - uses Levemir 10 U at home - currently on SSI as needed    ESRD (end stage renal disease) - dialysis per nephrology - Stable at this time  Anemia due to chronic disease - appreciate renal management of this    Essential hypertension - on Lopressor, Amlodipine - BP stable, albeit suboptimally controlled - Continue hydralazine IV PRN sbp>160 - Continue PRN IV labetalol for added BP control  Hypothyroidism -cont Synthroid as tolerated -Stable at this time   GERD - on Protonix daily at home   DVT prophylaxis: heparin subQ Code Status: Full Family Communication: Pt in room, family not at bedside, pt declined MD calling Disposition Plan: Uncertain at this time  Consultants:   Cardiology  Nephrology  Procedures:  1. The left ventricle has normal systolic function, with an ejection fraction of 60-65%. The cavity size was normal. There is mildly increased left ventricular wall thickness. No evidence of  left ventricular regional wall motion abnormalities. 2. The tricuspid valve was grossly normal. Tricuspid valve regurgitation is mild-moderate. 3. The aortic valve is tricuspid.  Antimicrobials: Anti-infectives (From  admission, onward)   Start     Dose/Rate Route Frequency Ordered Stop   06/28/18 1000  azithromycin (ZITHROMAX) 500 mg in sodium chloride 0.9 % 250 mL IVPB  Status:  Discontinued     500 mg 250 mL/hr over 60 Minutes Intravenous Every 24 hours 06/27/18 1007 06/28/18 0749   06/26/18 1200  vancomycin (VANCOCIN) IVPB 750 mg/150 ml premix  Status:  Discontinued     750 mg 150 mL/hr over 60 Minutes Intravenous Every T-Th-Sa (Hemodialysis) 06/24/18 0527 06/24/18 1316   06/24/18 2200  cefTRIAXone (ROCEPHIN) 1 g in sodium chloride 0.9 % 100 mL IVPB     1 g 200 mL/hr over 30 Minutes Intravenous Every 24 hours 06/24/18 0428 06/30/18 2153   06/24/18 2200  vancomycin (VANCOCIN) IVPB 750 mg/150 ml premix  Status:  Discontinued     750 mg 150 mL/hr over 60 Minutes Intravenous Every 24 hours 06/24/18 1316 06/26/18 0955   06/24/18 0500  azithromycin (ZITHROMAX) 500 mg in sodium chloride 0.9 % 250 mL IVPB  Status:  Discontinued     500 mg 250 mL/hr over 60 Minutes Intravenous Every 24 hours 06/24/18 0428 06/27/18 1007      Subjective: Feeling nauseated this morning while working with PT  Objective: Vitals:   07/06/18 1000 07/06/18 1002 07/06/18 1100 07/06/18 1200  BP: (!) 121/51 (!) 121/51 (!) 135/53 (!) 129/51  Pulse:      Resp: (!) 23  (!) 23 18  Temp:   98.2 F (36.8 C)   TempSrc:   Oral   SpO2:      Weight:      Height:        Intake/Output Summary (Last 24 hours) at 07/06/2018 1357 Last data filed at 07/06/2018 1000 Gross per 24 hour  Intake 180 ml  Output 2060 ml  Net -1880 ml   Filed Weights   07/05/18 1426 07/05/18 1836 07/06/18 0616  Weight: 67.3 kg 65.3 kg 65 kg    Examination: General exam: Awake, laying in bed, in nad Respiratory system: Normal respiratory effort, no wheezing Cardiovascular system: regular rate, s1, s2 Gastrointestinal system: Soft, nondistended, positive BS Central nervous system: CN2-12 grossly intact, strength intact Extremities: Perfused, no  clubbing Skin: Normal skin turgor, no notable skin lesions seen Psychiatry: Mood normal // no visual hallucinations   Data Reviewed: I have personally reviewed following labs and imaging studies  CBC: Recent Labs  Lab 06/30/18 0320 07/01/18 0227 07/02/18 0313 07/03/18 0649 07/04/18 0204  WBC 5.9 5.8 4.7 4.7 8.4  HGB 8.3* 9.3* 9.1* 9.2* 11.4*  HCT 27.4* 30.1* 28.8* 29.7* 35.7*  MCV 96.1 95.6 92.9 94.0 94.7  PLT 119* 133* 179 221 893   Basic Metabolic Panel: Recent Labs  Lab 06/30/18 0320 07/02/18 0313 07/03/18 0649 07/03/18 1650 07/04/18 0204 07/04/18 2251 07/06/18 0409  NA 138 139 142 139 138  --  138  K 3.6 3.4* 3.3* 3.7 3.7 3.7 3.8  CL 102 103 104 99 100  --  97*  CO2 24 23 21* 26 23  --  25  GLUCOSE 234* 130* 190* 240* 217*  --  218*  BUN 34* 29* 38* 15 20  --  21  CREATININE 4.23* 4.95* 6.30* 3.05* 4.05*  --  3.93*  CALCIUM 7.4* 7.8* 7.6* 8.1* 8.5*  --  8.4*  MG 1.9 2.0  --   --  2.0 2.4 2.0  PHOS 3.1 3.9 4.7*  --   --   --   --    GFR: Estimated Creatinine Clearance: 15.4 mL/min (A) (by C-G formula based on SCr of 3.93 mg/dL (H)). Liver Function Tests: Recent Labs  Lab 06/30/18 0320 07/02/18 0313 07/03/18 0649  ALBUMIN 2.1* 2.3* 2.4*   No results for input(s): LIPASE, AMYLASE in the last 168 hours. No results for input(s): AMMONIA in the last 168 hours. Coagulation Profile: No results for input(s): INR, PROTIME in the last 168 hours. Cardiac Enzymes: No results for input(s): CKTOTAL, CKMB, CKMBINDEX, TROPONINI in the last 168 hours. BNP (last 3 results) No results for input(s): PROBNP in the last 8760 hours. HbA1C: No results for input(s): HGBA1C in the last 72 hours. CBG: Recent Labs  Lab 07/05/18 0648 07/05/18 1107 07/05/18 1641 07/05/18 2147 07/06/18 0657  GLUCAP 226* 182* 113* 193* 209*   Lipid Profile: No results for input(s): CHOL, HDL, LDLCALC, TRIG, CHOLHDL, LDLDIRECT in the last 72 hours. Thyroid Function Tests: No results for  input(s): TSH, T4TOTAL, FREET4, T3FREE, THYROIDAB in the last 72 hours. Anemia Panel: No results for input(s): VITAMINB12, FOLATE, FERRITIN, TIBC, IRON, RETICCTPCT in the last 72 hours. Sepsis Labs: No results for input(s): PROCALCITON, LATICACIDVEN in the last 168 hours.  Recent Results (from the past 240 hour(s))  MRSA PCR Screening     Status: None   Collection Time: 07/04/18  3:29 AM  Result Value Ref Range Status   MRSA by PCR NEGATIVE NEGATIVE Final    Comment:        The GeneXpert MRSA Assay (FDA approved for NASAL specimens only), is one component of a comprehensive MRSA colonization surveillance program. It is not intended to diagnose MRSA infection nor to guide or monitor treatment for MRSA infections. Performed at [facility], [address] [place], [place] [address]      Radiology Studies: No results found.  Scheduled Meds: . amLODipine  10 mg Oral Daily  . aspirin  81 mg Oral Daily  . chlorhexidine  15 mL Mouth Rinse BID  . Chlorhexidine Gluconate Cloth  6 each Topical Q0600  . Chlorhexidine Gluconate Cloth  6 each Topical Q0600  . Chlorhexidine Gluconate Cloth  6 each Topical Q0600  . darbepoetin (ARANESP) injection - DIALYSIS  150 mcg Intravenous Q Tue-HD  . feeding supplement (NEPRO CARB STEADY)  237 mL Oral BID BM  . feeding supplement (PRO-STAT SUGAR FREE 64)  30 mL Oral BID  . heparin  5,000 Units Subcutaneous Q8H  . insulin aspart  0-5 Units Subcutaneous QHS  . insulin aspart  0-9 Units Subcutaneous TID WC  . insulin detemir  10 Units Subcutaneous QHS  . levothyroxine  50 mcg Per Tube Q0600  . lisinopril  5 mg Oral Daily  . mouth rinse  15 mL Mouth Rinse BID  . metoprolol tartrate  75 mg Oral BID  . multivitamin  1 tablet Oral QHS  . nicotine  14 mg Transdermal Daily  . pantoprazole (PROTONIX) IV  40 mg Intravenous Daily  . sodium chloride flush  3 mL Intravenous Q12H   Continuous Infusions: . sodium chloride 10 mL/hr at 07/04/18 1800   . ferric gluconate (FERRLECIT/NULECIT) IV Stopped (07/03/18 1257)  . sodium chloride 250 mL (06/28/18 1511)     LOS: 12 days   Marylu Lund, MD [facility] Pager On Amion  If 7PM-7AM, please contact night-coverage 07/06/2018,  1:57 PM

## 2018-07-06 NOTE — Progress Notes (Signed)
Occupational Therapy Treatment Patient Details Name: Alexander Whitaker MRN: [id number] DOB: 08/31/1950 Today's Date: 07/06/2018    History of present illness Pt is a 68 y.o. male admitted 06/24/18 with fever and AMS. CXR showed diffuse infiltrates. Worked up for sepsis, CAP, VDRF with hypoxia. 3/31 with NSTEMI. 4/1 afib with RVR subsequently had torsades vs VT, requiring compression and defibrillation. ETT 3/28-4/4. S/p cardiac cath showing nonobstructive disease. Transfer to ICU early 4/8 due to cardiac arrest with 3 mins of CPR with ROSC. PMH includes ESRD on HD, stroke, HTN, CHF.   OT comments  Pt progressing towards OT goals this session. Will not track past midline visually and R inattention to UE during RW use. Unsure of [name] due to history of CVA. Pt was able to perform BCS transfer x2 with mod A +2, max A +2 safety with RW for analpericare. While supine in the bed pt was very lethargic. Upon upright he became very nauseous. Pt continues to be impacted MOST by his cognitive deficits and we continue to recommend CIR level therapy post-acute. Should his wife still choose to take him home he will need HHOT, potentially aide, and DME to the fullest.    Follow Up Recommendations  CIR;Supervision/Assistance - 24 hour    Equipment Recommendations  3 in 1 bedside commode    Recommendations for Other Services      Precautions / Restrictions Precautions Precautions: Fall Restrictions Weight Bearing Restrictions: No       Mobility Bed Mobility Overal bed mobility: Needs Assistance Bed Mobility: Supine to Sit     Supine to sit: +2 for physical assistance;Total assist     General bed mobility comments: Pt with difficulty initiating movement. Total assist due to cognition  Transfers Overall transfer level: Needs assistance Equipment used: Rolling walker (2 wheeled) Transfers: Sit to/from [facility] Sit to Stand: +2 physical assistance;Max assist;Mod assist Stand  pivot transfers: +2 physical assistance;Mod assist       General transfer comment: Assist to bring hips up and for balance. Initial stand from bed with hand held required +2 max assist due to decr initiation. On subsequent stands with walker pt able to stand with +2 mod to min assist. Assist to place rt hand on walker due to rt inattention.     Balance Overall balance assessment: Needs assistance Sitting-balance support: Bilateral upper extremity supported Sitting balance-Leahy Scale: Poor Sitting balance - Comments: UE support for sitting   Standing balance support: Bilateral upper extremity supported Standing balance-Leahy Scale: Poor Standing balance comment: walker and min assist for static standing                           ADL either performed or assessed with clinical judgement   ADL Overall ADL's : Needs assistance/impaired     Grooming: Moderate assistance;Sitting Grooming Details (indicate cue type and reason): cognition impacting abiulity to perform grooming                 Toilet Transfer: +2 for physical assistance;Moderate assistance;Stand-pivot;BSC Toilet Transfer Details (indicate cue type and reason): with hand placement and verbal cues, pt can perform with min to mod A Toileting- Clothing Manipulation and Hygiene: Maximal assistance;+2 for physical assistance;+2 for safety/equipment;Sit to/from stand Toileting - Clothing Manipulation Details (indicate cue type and reason): Pt able to stand with RW and one assist, one therapist to perform rear peri care     Functional mobility during ADLs: Moderate assistance;+2 for  safety/equipment;Rolling walker;Cueing for sequencing       Vision       Perception     Praxis      Cognition Arousal/Alertness: Awake/alert Behavior During Therapy: Flat affect Overall Cognitive Status: Impaired/Different from baseline Area of Impairment: Orientation;Memory;Following  commands;Safety/judgement;Awareness;Problem solving;Attention                 Orientation Level: Disoriented to;Time;Situation;Place Current Attention Level: Sustained Memory: Decreased short-term memory;Decreased recall of precautions Following Commands: Follows one step commands inconsistently;Follows one step commands with increased time Safety/Judgement: Decreased awareness of safety;Decreased awareness of deficits Awareness: Intellectual Problem Solving: Decreased initiation;Slow processing;Requires verbal cues;Requires tactile cues General Comments: Pt with noted inattention to right side, will not track past midline, unsure if this is baseline        Exercises     Shoulder Instructions       General Comments      Pertinent Vitals/ Pain       Pain Assessment: No/denies pain  Home Living                                          Prior Functioning/Environment              Frequency  Min 2X/week        Progress Toward Goals  OT Goals(current goals can now be found in the care plan section)  Progress towards OT goals: Progressing toward goals  Acute Rehab OT Goals Patient Stated Goal: go home with his wife Alexander Whitaker OT Goal Formulation: With patient Time For Goal Achievement: 07/16/18 Potential to Achieve Goals: Good  Plan Discharge plan remains appropriate;Frequency remains appropriate    Co-evaluation    PT/OT/SLP Co-Evaluation/Treatment: Yes Reason for Co-Treatment: For patient/therapist safety;Necessary to address cognition/behavior during functional activity PT goals addressed during session: Mobility/safety with mobility;Balance;Proper use of DME OT goals addressed during session: ADL's and self-care;Proper use of [employer]ptive equipment and DME      AM-PAC OT "6 Clicks" Daily Activity     Outcome Measure   Help from another person eating meals?: A Lot Help from another person taking care of personal grooming?: A Lot Help from  another person toileting, which includes using toliet, bedpan, or urinal?: A Lot Help from another person bathing (including washing, rinsing, drying)?: A Lot Help from another person to put on and taking off regular upper body clothing?: A Lot Help from another person to put on and taking off regular lower body clothing?: A Lot 6 Click Score: 12    End of Session Equipment Utilized During Treatment: Gait belt  OT Visit Diagnosis: Unsteadiness on feet (R26.81);Muscle weakness (generalized) (M62.81);Other symptoms and signs involving cognitive function   Activity Tolerance Patient limited by lethargy   Patient Left in bed;with call bell/phone within reach;with bed alarm set   Nurse Communication Mobility status        Time: 2122-4825 OT Time Calculation (min): 43 min  Charges: OT General Charges $OT Visit: 1 Visit OT Treatments $Self Care/Home Management : 8-22 mins  Hulda Humphrey OTR/L Acute Rehabilitation Services Pager: [phone] Office: [place] 07/06/2018, 9:37 AM

## 2018-07-06 NOTE — Progress Notes (Signed)
Physical Therapy Treatment Patient Details Name: Alexander Whitaker MRN: [id number] DOB: Apr 01, 1950 Today's Date: 07/06/2018    History of Present Illness Pt is a 68 y.o. male admitted 06/24/18 with fever and AMS. CXR showed diffuse infiltrates. Worked up for sepsis, CAP, VDRF with hypoxia. 3/31 with NSTEMI. 4/1 afib with RVR subsequently had torsades vs VT, requiring compression and defibrillation. ETT 3/28-4/4. S/p cardiac cath showing nonobstructive disease. Transfer to ICU early 4/8 due to cardiac arrest with 3 mins of CPR with ROSC. PMH includes ESRD on HD, stroke, HTN, CHF.    PT Comments    Pt making steady progress. Pt's cognition causes fluctuation in mobility.  Pt's wife plans to have pt return home. Pt will need significant assist due to cognition and weakness.   Follow Up Recommendations  Home health PT;Supervision/Assistance - 24 hour(wife refusing CIR or SNF )     Equipment Recommendations  Rolling walker with 5" wheels;3in1 (PT);Wheelchair (measurements PT);Other (comment)(possibly hoyer lift)    Recommendations for Other Services       Precautions / Restrictions Precautions Precautions: Fall Restrictions Weight Bearing Restrictions: No    Mobility  Bed Mobility Overal bed mobility: Needs Assistance Bed Mobility: Supine to Sit     Supine to sit: +2 for physical assistance;Total assist     General bed mobility comments: Pt with difficulty initiating movement. Total assist due to cognition  Transfers Overall transfer level: Needs assistance Equipment used: Rolling walker (2 wheeled) Transfers: Sit to/from [facility] Sit to Stand: +2 physical assistance;Max assist;Mod assist Stand pivot transfers: +2 physical assistance;Mod assist       General transfer comment: Assist to bring hips up and for balance. Initial stand from bed with hand held required +2 max assist due to decr initiation. On subsequent stands with walker pt able to stand with +2  mod to min assist. Assist to place rt hand on walker due to rt inattention.   Ambulation/Gait Ambulation/Gait assistance: Mod assist;+2 safety/equipment Gait Distance (Feet): 10 Feet Assistive device: Rolling walker (2 wheeled) Gait Pattern/deviations: Step-through pattern;Decreased step length - right;Decreased step length - left;Shuffle;Trunk flexed Gait velocity: decr Gait velocity interpretation: <1.31 ft/sec, indicative of household ambulator General Gait Details: Assist with balance and support and close follow with chair. Assist for pt to place rt hand on walker due to inattention.    Stairs             Wheelchair Mobility    Modified Rankin (Stroke Patients Only)       Balance Overall balance assessment: Needs assistance Sitting-balance support: Bilateral upper extremity supported Sitting balance-Leahy Scale: Poor Sitting balance - Comments: UE support for sitting   Standing balance support: Bilateral upper extremity supported Standing balance-Leahy Scale: Poor Standing balance comment: walker and min assist for static standing                            Cognition Arousal/Alertness: Awake/alert Behavior During Therapy: Flat affect Overall Cognitive Status: Impaired/Different from baseline Area of Impairment: Orientation;Memory;Following commands;Safety/judgement;Awareness;Problem solving;Attention                 Orientation Level: Disoriented to;Time;Situation;Place Current Attention Level: Sustained Memory: Decreased short-term memory;Decreased recall of precautions Following Commands: Follows one step commands inconsistently;Follows one step commands with increased time Safety/Judgement: Decreased awareness of safety;Decreased awareness of deficits Awareness: Intellectual Problem Solving: Decreased initiation;Slow processing;Requires verbal cues;Requires tactile cues        Exercises  General Comments        Pertinent  Vitals/Pain Pain Assessment: No/denies pain    Home Living                      Prior Function            PT Goals (current goals can now be found in the care plan section) Progress towards PT goals: Progressing toward goals    Frequency    Min 3X/week      PT Plan Discharge plan needs to be updated    Co-evaluation PT/OT/SLP Co-Evaluation/Treatment: Yes Reason for Co-Treatment: Necessary to address cognition/behavior during functional activity;For patient/therapist safety PT goals addressed during session: Mobility/safety with mobility        AM-PAC PT "6 Clicks" Mobility   Outcome Measure  Help needed turning from your back to your side while in a flat bed without using bedrails?: Total Help needed moving from lying on your back to sitting on the side of a flat bed without using bedrails?: Total Help needed moving to and from a bed to a chair (including a wheelchair)?: A Lot Help needed standing up from a chair using your arms (e.g., wheelchair or bedside chair)?: A Lot Help needed to walk in hospital room?: A Lot Help needed climbing 3-5 steps with a railing? : Total 6 Click Score: 9    End of Session Equipment Utilized During Treatment: Gait belt Activity Tolerance: Patient limited by fatigue Patient left: in chair;with call bell/phone within reach;with chair alarm set Nurse Communication: Mobility status PT Visit Diagnosis: Unsteadiness on feet (R26.81);Muscle weakness (generalized) (M62.81);Other abnormalities of gait and mobility (R26.89)     Time: 5449-2010 PT Time Calculation (min) (ACUTE ONLY): 43 min  Charges:  $Therapeutic Activity: 23-37 mins                     [place] Pager [phone] Office [place] 07/06/2018, 9:34 AM

## 2018-07-06 NOTE — Progress Notes (Signed)
  Speech Language Pathology Treatment: Dysphagia  Patient Details Name: Alexander Whitaker MRN: [id number] DOB: [birth year] Today's Date: 07/06/2018 Time: 6837-2902 SLP Time Calculation (min) (ACUTE ONLY): 15 min  Assessment / Plan / Recommendation Clinical Impression  Pt was drowsy but agreeable to bites of magic cup, sips of water. RN and SLP assisted with repositioning him but he continues to keep his neck in extension. Multiple swallows and coughing was noted with both consistencies, but was eliminated after SLP provided physical assist to bring head to a more neutral position. Mod cues were provided for sustained attention as he was drowsy this afternoon. Would maintain current diet but will f/u for potential advancement if mentation improves.   HPI HPI: 68 year old male with history of esrd on hd, cva, htn, chf, presented to [place] with ams.  He had fever of 101.6 at [place] and only responsive to deep stimuli.  Cxr showed diffuse infiltrates. Inutbated from 3/28 until 4/4. VT arrest requiring amiodarone and chest compressions on 06/27/2018, cath nonobstructive disease.      SLP Plan  Continue with current plan of care       Recommendations  Diet recommendations: Dysphagia 2 (fine chop);Thin liquid Liquids provided via: Cup;Straw Medication Administration: Whole meds with puree Supervision: Full supervision/cueing for compensatory strategies Compensations: Minimize environmental distractions;Slow rate;Small sips/bites Postural Changes and/or Swallow Maneuvers: Seated upright 90 degrees;Upright 30-60 min after meal                Oral Care Recommendations: Oral care BID Follow up Recommendations: 24 hour supervision/assistance;Inpatient Rehab SLP Visit Diagnosis: Dysphagia, oropharyngeal phase (R13.12) Plan: Continue with current plan of care       GO                Venita Sheffield Daron Stutz 07/06/2018, 4:15 PM  [name], M.A. [place] Acute Environmental education officer  [phone] Office [phone]

## 2018-07-06 NOTE — Progress Notes (Signed)
Inpatient Diabetes Program Recommendations  AACE/ADA: New Consensus Statement on Inpatient Glycemic Control (2015)  Target Ranges:  Prepandial:   less than 140 mg/dL      Peak postprandial:   less than 180 mg/dL (1-2 hours)      Critically ill patients:  140 - 180 mg/dL   Lab Results  Component Value Date   GLUCAP 209 (H) 07/06/2018   HGBA1C 9.1 (H) 11/21/2017    Review of Glycemic Control Results for Alexander Whitaker, Alexander Whitaker (MRN [id number]) as of 07/06/2018 12:49  Ref. Range 07/05/2018 16:41 07/05/2018 21:47 07/06/2018 06:57  Glucose-Capillary Latest Ref Range: 70 - 99 mg/dL 113 (H) 193 (H) 209 (H)  Diabetes history: DM 2 Outpatient Diabetes medications: Levemir 10 units daily (patient taking Levemir 50 units daily per medication reconciliation) Current orders for Inpatient glycemic control:  Novolog sensitive tid with meals and HS, Levemir 10 units daily Inpatient Diabetes Program Recommendations:    May consider increasing Levemir to 14 units daily.   Thanks,  Adah Perl, RN, BC-ADM Inpatient Diabetes Coordinator Pager [phone] (8a-5p)

## 2018-07-07 LAB — RENAL FUNCTION PANEL
Albumin: 2.8 g/dL — ABNORMAL LOW (ref 3.5–5.0)
Anion gap: 15 (ref 5–15)
BUN: 45 mg/dL — ABNORMAL HIGH (ref 8–23)
CO2: 22 mmol/L (ref 22–32)
Calcium: 8.4 mg/dL — ABNORMAL LOW (ref 8.9–10.3)
Chloride: 98 mmol/L (ref 98–111)
Creatinine, Ser: 6.42 mg/dL — ABNORMAL HIGH (ref 0.61–1.24)
GFR calc Af Amer: 9 mL/min — ABNORMAL LOW (ref >60)
GFR calc non Af Amer: 8 mL/min — ABNORMAL LOW (ref >60)
Glucose, Bld: 272 mg/dL — ABNORMAL HIGH (ref 70–99)
Phosphorus: 4 mg/dL (ref 2.5–4.6)
Potassium: 3.9 mmol/L (ref 3.5–5.1)
Sodium: 135 mmol/L (ref 135–145)

## 2018-07-07 LAB — BASIC METABOLIC PANEL
Anion gap: 16 — ABNORMAL HIGH (ref 5–15)
BUN: 43 mg/dL — ABNORMAL HIGH (ref 8–23)
CO2: 25 mmol/L (ref 22–32)
Calcium: 8.5 mg/dL — ABNORMAL LOW (ref 8.9–10.3)
Chloride: 94 mmol/L — ABNORMAL LOW (ref 98–111)
Creatinine, Ser: 6.57 mg/dL — ABNORMAL HIGH (ref 0.61–1.24)
GFR calc Af Amer: 9 mL/min — ABNORMAL LOW (ref >60)
GFR calc non Af Amer: 8 mL/min — ABNORMAL LOW (ref >60)
Glucose, Bld: 274 mg/dL — ABNORMAL HIGH (ref 70–99)
Potassium: 3.8 mmol/L (ref 3.5–5.1)
Sodium: 135 mmol/L (ref 135–145)

## 2018-07-07 LAB — CBC
HCT: 35.6 % — ABNORMAL LOW (ref 39.0–52.0)
Hemoglobin: 11.3 g/dL — ABNORMAL LOW (ref 13.0–17.0)
MCH: 29.8 pg (ref 26.0–34.0)
MCHC: 31.7 g/dL (ref 30.0–36.0)
MCV: 93.9 fL (ref 80.0–100.0)
Platelets: 345 10*3/uL (ref 150–400)
RBC: 3.79 MIL/uL — ABNORMAL LOW (ref 4.22–5.81)
RDW: 14.5 % (ref 11.5–15.5)
WBC: 8.3 10*3/uL (ref 4.0–10.5)
nRBC: 0 % (ref 0.0–0.2)

## 2018-07-07 LAB — GLUCOSE, CAPILLARY
Glucose-Capillary: 212 mg/dL — ABNORMAL HIGH (ref 70–99)
Glucose-Capillary: 219 mg/dL — ABNORMAL HIGH (ref 70–99)
Glucose-Capillary: 271 mg/dL — ABNORMAL HIGH (ref 70–99)
Glucose-Capillary: 170 mg/dL — ABNORMAL HIGH (ref 70–99)
Glucose-Capillary: 158 mg/dL — ABNORMAL HIGH (ref 70–99)

## 2018-07-07 LAB — MAGNESIUM: Magnesium: 2.2 mg/dL (ref 1.7–2.4)

## 2018-07-07 MED ORDER — ALTEPLASE 2 MG IJ SOLR: 2.0000 mg | Freq: Once | INTRAMUSCULAR | Status: DC | PRN

## 2018-07-07 MED ORDER — PENTAFLUOROPROP-TETRAFLUOROETH EX AERO: 1.0000 "application " | INHALATION_SPRAY | CUTANEOUS | Status: DC | PRN

## 2018-07-07 MED ORDER — CHLORHEXIDINE GLUCONATE CLOTH 2 % EX PADS: 6.0000 | MEDICATED_PAD | Freq: Every day | CUTANEOUS | Status: DC

## 2018-07-07 MED ORDER — SODIUM CHLORIDE 0.9 % IV SOLN: 100.0000 mL | INTRAVENOUS | Status: DC | PRN

## 2018-07-07 MED ORDER — HEPARIN SODIUM (PORCINE) 1000 UNIT/ML DIALYSIS: 1000.0000 [IU] | INTRAMUSCULAR | Status: DC | PRN

## 2018-07-07 MED ORDER — LIDOCAINE-PRILOCAINE 2.5-2.5 % EX CREA: 1.0000 "application " | TOPICAL_CREAM | CUTANEOUS | Status: DC | PRN

## 2018-07-07 MED ORDER — METOPROLOL TARTRATE 100 MG PO TABS
100.0000 mg | ORAL_TABLET | Freq: Two times a day (BID) | ORAL | Status: DC
  Administered 2018-07-07 – 2018-07-09 (×4): 100 mg via ORAL
  Filled 2018-07-07 (×4): qty 1

## 2018-07-07 MED ORDER — LIDOCAINE HCL (PF) 1 % IJ SOLN: 5.0000 mL | INTRAMUSCULAR | Status: DC | PRN

## 2018-07-07 NOTE — Progress Notes (Signed)
Progress Note  Patient Name: Alexander Whitaker Date of Encounter: 07/07/2018  Primary Cardiologist: Dr. Ellyn Hack  Subjective   Patient feels well but remains confused.  Thinks that it is 2002 and Maudie Flakes is the president.  Does know that he is in [facility].  Inpatient Medications    Scheduled Meds: . amLODipine  10 mg Oral Daily  . aspirin  81 mg Oral Daily  . chlorhexidine  15 mL Mouth Rinse BID  . Chlorhexidine Gluconate Cloth  6 each Topical Q0600  . Chlorhexidine Gluconate Cloth  6 each Topical Q0600  . Chlorhexidine Gluconate Cloth  6 each Topical Q0600  . darbepoetin (ARANESP) injection - DIALYSIS  150 mcg Intravenous Q Tue-HD  . feeding supplement (NEPRO CARB STEADY)  237 mL Oral BID BM  . feeding supplement (PRO-STAT SUGAR FREE 64)  30 mL Oral BID  . heparin  5,000 Units Subcutaneous Q8H  . insulin aspart  0-5 Units Subcutaneous QHS  . insulin aspart  0-9 Units Subcutaneous TID WC  . insulin detemir  10 Units Subcutaneous QHS  . levothyroxine  50 mcg Per Tube Q0600  . lisinopril  5 mg Oral Daily  . mouth rinse  15 mL Mouth Rinse BID  . metoprolol tartrate  75 mg Oral BID  . multivitamin  1 tablet Oral QHS  . nicotine  14 mg Transdermal Daily  . pantoprazole (PROTONIX) IV  40 mg Intravenous Daily  . sodium chloride flush  3 mL Intravenous Q12H   Continuous Infusions: . sodium chloride 10 mL/hr at 07/04/18 1800  . ferric gluconate (FERRLECIT/NULECIT) IV Stopped (07/03/18 1257)  . sodium chloride 250 mL (06/28/18 1511)   PRN Meds: sodium chloride, acetaminophen, hydrALAZINE, labetalol, sodium chloride, sodium chloride flush   Vital Signs    Vitals:   07/07/18 0303 07/07/18 0400 07/07/18 0500 07/07/18 0656  BP: (!) 130/55 (!) 125/51 139/60   Pulse: 69 67 70   Resp: 19 13 (!) 26   Temp:    98.1 F (36.7 C)  TempSrc:    Oral  SpO2: 96% 98% 95%   Weight:      Height:        Intake/Output Summary (Last 24 hours) at 07/07/2018 0821 Last data filed at  07/07/2018 0600 Gross per 24 hour  Intake 480 ml  Output 30 ml  Net 450 ml    I/O since admission: +46  Filed Weights   07/05/18 1836 07/06/18 0616 07/07/18 0300  Weight: 65.3 kg 65 kg 65.4 kg    Telemetry    Sinus rhythm personally reviewed   ECG    ECG 07/07/2018 personally reviewed sinus rhythm, QTC 600, deep T wave inversions V2 through V6, T wave inversions lead I and aVL  07/06/2018 ECG (independently read by me): Normal sinus rhythm at 75 bpm.  LVH.  QTc interval  587 ms; persistent marked T wave inversion V2 through V6 and mild T wave inversion 1 and aVL.  07/05/2018 ECG (independently read by me): NSR at 72; LAD Diffuse marked T wave inversion ; Increaed QTc 586 msec, but improved.      07/04/2018 ECG (independently read by me): Post recurrent torsade arrest: Normal sinus rhythm at 79, left axis deviation, LVH, QTc interval 616.  Previously noted T wave inversions V3 through V6 and mildly in 1 and aVL.  Physical Exam    BP 139/60   Pulse 70   Temp 98.1 F (36.7 C) (Oral)   Resp (!) 26  Ht 5\' 5"  (1.651 m)   Wt 65.4 kg   SpO2 95%   BMI 23.99 kg/m  GEN: Well nourished, well developed, in no acute distress  HEENT: normal  Neck: no JVD, carotid bruits, or masses Cardiac: RRR; no murmurs, rubs, or gallops,no edema  Respiratory:  clear to auscultation bilaterally, normal work of breathing GI: soft, nontender, nondistended, + BS MS: no deformity or atrophy  Skin: warm and dry Neuro:  Strength and sensation are intact Psych: euthymic mood, full affect oriented to place, not time    Labs    Chemistry Recent Labs  Lab 07/02/18 0313 07/03/18 0649 07/03/18 1650 07/04/18 0204 07/04/18 2251 07/06/18 0409  NA 139 142 139 138  --  138  K 3.4* 3.3* 3.7 3.7 3.7 3.8  CL 103 104 99 100  --  97*  CO2 23 21* 26 23  --  25  GLUCOSE 130* 190* 240* 217*  --  218*  BUN 29* 38* 15 20  --  21  CREATININE 4.95* 6.30* 3.05* 4.05*  --  3.93*  CALCIUM 7.8* 7.6* 8.1* 8.5*   --  8.4*  ALBUMIN 2.3* 2.4*  --   --   --   --   GFRNONAA 11* 8* 20* 14*  --  15*  GFRAA 13* 10* 23* 16*  --  17*  ANIONGAP 13 17* 14 15  --  16*     Hematology Recent Labs  Lab 07/02/18 0313 07/03/18 0649 07/04/18 0204  WBC 4.7 4.7 8.4  RBC 3.10* 3.16* 3.77*  HGB 9.1* 9.2* 11.4*  HCT 28.8* 29.7* 35.7*  MCV 92.9 94.0 94.7  MCH 29.4 29.1 30.2  MCHC 31.6 31.0 31.9  RDW 14.4 14.4 14.4  PLT 179 221 332    Cardiac EnzymesNo results for input(s): TROPONINI in the last 168 hours. No results for input(s): TROPIPOC in the last 168 hours.   BNPNo results for input(s): BNP, PROBNP in the last 168 hours.   DDimer No results for input(s): DDIMER in the last 168 hours.   Lipid Panel     Component Value Date/Time   CHOL 183 11/22/2017 0322   TRIG 173 (H) 06/30/2018 0320   HDL 27 (L) 11/22/2017 0322   CHOLHDL 6.8 11/22/2017 0322   VLDL 57 (H) 11/22/2017 0322   LDLCALC 99 11/22/2017 0322     Radiology    No results found.  Cardiac Studies   Cath: 06/27/18   Prox RCA lesion is 30% stenosed.  Mid RCA-1 lesion is 20% stenosed.  Mid RCA-2 lesion is 30% stenosed.  Ost LM to Mid LM lesion is 5% stenosed.  Mid LAD lesion is 10% stenosed.  There is moderate left ventricular systolic dysfunction.  Moderate LV dysfunction with an EF estimated 40% with diffuse hypocontractility. LVEDP 13 mm.  Mild nonobstructive CAD with smooth 5% narrowing in a long large left main coronary artery which trifurcated into a large LAD, ramus intermediate, and left circumflex vessel. There was mild 10% smooth narrowing in the distal LAD.   Large dominant RCA with 3020 and 30% proximal to mid and mid distal narrowings.  RECOMMENDATION: Medical therapy for nonischemic cardiomyopathy. Present the patient continues to be on epinephrine, Neo-Synephrine, amiodarone. Plan dialysis as scheduled tomorrow.   ECHO IMPRESSIONS04/04/2018 1. The left ventricle has normal systolic function, with  an ejection fraction of 60-65%. The cavity size was normal. There is mildly increased left ventricular wall thickness. No evidence of left ventricular regional wall motion abnormalities. 2. The  tricuspid valve was grossly normal. Tricuspid valve regurgitation is mild-moderate. 3. The aortic valve is tricuspid.  Patient Profile     68 y.o. male admitted with fever and sob, subsequently develoed PMVT in the setting of long QT in the setting of QT prolonging drugs.    Assessment & Plan    1.  Recurrent polymorphic ventricular tachycardia/torsade: Occurred in the morning of 4/8 with 1 defibrillation with restoration of sinus rhythm.  He had received a dose of Compazine prior to the episode.  He has had no further ventricular arrhythmias overnight.    2.  Prolonged QTc interval: Thought to be the cause of his ventricular arrhythmias.  May have been worsened by azithromycin and possible amiodarone.  He did get a dose of Compazine prior to his event.  At this point it appears that his issues with ventricular arrhythmias are likely due to prolonged QTC.  His QTC remains prolonged today.  We Leul Narramore work to increase his beta-blocker dose.  Must avoid all QT prolonging medications.  As he is remained stable, Malina Geers transfer him to the floor.    3.  Nonischemic cardiomyopathy: Ejection fraction approximately 40% via cath with diffuse hypocontractility.  Mild nonobstructive coronary artery disease.  Marked T wave abnormalities across the precordium could be due to his prolonged QTC.Echo has normalized.  His low ejection fraction could be due to his prior arrest.  4.  Stage renal disease: Currently on hemodialysis Tuesday Thursday Saturday.  Plan per renal.   5. Diabetes: Blood glucose has been elevated, Lavonta Tillis continue to have a goal less than 150.  6. Hypothyroidism continue Synthroid  7 Altered mental status; pleasantly alert, but not oriented.  Continue with current therapy.  Signed, Troy Sine, MD,  [facility] 07/07/2018, 8:21 AM

## 2018-07-07 NOTE — Progress Notes (Signed)
PROGRESS NOTE    Alexander Whitaker  LKG:[id number] DOB: 1950/04/30 DOA: 06/24/2018 PCP: Paulina Fusi, MD    Brief Narrative:  Alexander Whitaker 68 y/o with ESRD on HD, CVA, HTN, CHF sent from [facility] for fever 101.6 and obtunded state for which he was intubated.  CXR showed diffuse infiltrates- started on Vanc, Ceftriaxone and Zithro. Blood sugar of 638, HONK, started on Insulin infusion. Hypotensive, started on Levophed.   Admitted to ICU team.   Femoral aline 06/24/18 RIJ 06/24/18  3/31-NSTEMI-  Troponin 2.75- Cardiology consulted 4/1- A-fib with RVR started on Amio infusion- subsequently had ? Torsades vs VT- compression, defib x 1> ROSC 4/2- EP consulted- suspected QT (which was already prolonged) became more prolonged with Amio, Azithromycin and abnormal electrolytes 4/1- Cardiac cath > no significant ischemia 4/2 Limited ECHO- normal EF, no diastolic dysfunction- mild to mod TR 4/4 Extubated   Assessment & Plan:   Principal Problem:   Sepsis (HCC) Active Problems:   Acute diastolic congestive heart failure (HCC)   Diabetes mellitus, type 2 (HCC)   ESRD (end stage renal disease) (HCC)   Essential hypertension   Respiratory failure with hypoxia (HCC)   Cardiac arrest with ventricular fibrillation (HCC)   Non-ST elevation (NSTEMI) myocardial infarction (HCC)   History of CVA (cerebrovascular accident)   Acute on chronic combined systolic and diastolic CHF (congestive heart failure) (HCC)   Dysphagia   Anemia of chronic disease   Prolonged QT interval   Polymorphic ventricular tachycardia (HCC)   NSVT (nonsustained ventricular tachycardia) (HCC)   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 Denies chest pain at this time    Cardiac arrest with ventricular fibrillation  - avoid QT prolonging meds - QTc just under 600 this AM - Recent torsades and symptomatic vtach -Cardiology following - avoid QT prolonging meds -Transfer to floor per Cardiology    Acute diastolic congestive heart failure - CXR 4/3> Improving pulmonary edema pattern. Mild residual perihilar/infrahilar opacities. - continue with fluid management with dialysis - Nephrology continues to follow  Dysphagia oropharyngeal phase after intubation - Dysphagia 2 diet per SLP    Diabetes mellitus, type 2  - uses Levemir 10 U at home - currently on SSI as needed    ESRD (end stage renal disease) - dialysis per nephrology - Stable at this time  Anemia due to chronic disease - appreciate renal management of this    Essential hypertension - on Lopressor, Amlodipine - BP stable, albeit suboptimally controlled - Continue hydralazine IV PRN sbp>160 - Continue on PRN IV labetalol for added BP control  Hypothyroidism -cont Synthroid as tolerated -Remains stable at this time   GERD - on Protonix daily at home   Hx CVA -Stable at present -PT/OT following  DVT prophylaxis: heparin subQ Code Status: Full Family Communication: Pt in room, family not at bedside, pt declined MD calling Disposition Plan: Uncertain at this time  Consultants:   Cardiology  Nephrology  Procedures:  1. The left ventricle has normal systolic function, with an ejection fraction of 60-65%. The cavity size was normal. There is mildly increased left ventricular wall thickness.  No evidence of left ventricular regional wall motion abnormalities. 2. The tricuspid valve was grossly normal. Tricuspid valve regurgitation is mild-moderate. 3. The aortic valve is tricuspid.  Antimicrobials: Anti-infectives (From  admission, onward)   Start     Dose/Rate Route Frequency Ordered Stop   06/28/18 1000  azithromycin (ZITHROMAX) 500 mg in sodium chloride 0.9 % 250 mL IVPB  Status:  Discontinued     500 mg 250 mL/hr over 60 Minutes Intravenous Every 24 hours 06/27/18 1007 06/28/18 0749   06/26/18 1200  vancomycin (VANCOCIN) IVPB 750 mg/150 ml premix  Status:  Discontinued     750 mg 150 mL/hr over 60 Minutes Intravenous Every T-Th-Sa (Hemodialysis) 06/24/18 0527 06/24/18 1316   06/24/18 2200  cefTRIAXone (ROCEPHIN) 1 g in sodium chloride 0.9 % 100 mL IVPB     1 g 200 mL/hr over 30 Minutes Intravenous Every 24 hours 06/24/18 0428 06/30/18 2153   06/24/18 2200  vancomycin (VANCOCIN) IVPB 750 mg/150 ml premix  Status:  Discontinued     750 mg 150 mL/hr over 60 Minutes Intravenous Every 24 hours 06/24/18 1316 06/26/18 0955   06/24/18 0500  azithromycin (ZITHROMAX) 500 mg in sodium chloride 0.9 % 250 mL IVPB  Status:  Discontinued     500 mg 250 mL/hr over 60 Minutes Intravenous Every 24 hours 06/24/18 0428 06/27/18 1007      Subjective: Seems somewhat confused this AM, otherwise denies chest pain or sob  Objective: Vitals:   07/07/18 0656 07/07/18 0700 07/07/18 0800 07/07/18 0900  BP:  (!) 162/62 (!) 149/62 (!) 115/52  Pulse:  71 77 73  Resp:  (!) 21 19 (!) 23  Temp: 98.1 F (36.7 C)     TempSrc: Oral     SpO2:  96% 95% 97%  Weight:      Height:        Intake/Output Summary (Last 24 hours) at 07/07/2018 1039 Last data filed at 07/07/2018 0800 Gross per 24 hour  Intake 600 ml  Output 30 ml  Net 570 ml   Filed Weights   07/05/18 1836 07/06/18 0616 07/07/18 0300  Weight: 65.3 kg 65 kg 65.4 kg    Examination: General exam: Conversant, in no acute distress Respiratory system: normal chest rise, clear, no audible wheezing Cardiovascular system: regular rhythm, s1-s2 Gastrointestinal system: Nondistended, nontender, pos BS Central nervous system: No seizures, no tremors Extremities: No  cyanosis, no joint deformities Skin: No rashes, no pallor Psychiatry: Affect normal // no auditory hallucinations   Data Reviewed: I have personally reviewed following labs and imaging studies  CBC: Recent Labs  Lab 07/01/18 0227 07/02/18 0313 07/03/18 0649 07/04/18 0204  WBC 5.8 4.7 4.7 8.4  HGB 9.3* 9.1* 9.2* 11.4*  HCT 30.1* 28.8* 29.7* 35.7*  MCV 95.6 92.9 94.0 94.7  PLT 133* 179 221 696   Basic Metabolic Panel: Recent Labs  Lab 07/02/18 0313 07/03/18 0649 07/03/18 1650 07/04/18 0204 07/04/18 2251 07/06/18 0409 07/07/18 0855  NA 139 142 139 138  --  138 135  K 3.4* 3.3* 3.7 3.7 3.7 3.8 3.8  CL 103 104 99 100  --  97* 94*  CO2 23 21* 26 23  --  25 25  GLUCOSE 130* 190* 240* 217*  --  218* 274*  BUN 29* 38* 15 20  --  21 43*  CREATININE 4.95* 6.30* 3.05* 4.05*  --  3.93* 6.57*  CALCIUM 7.8* 7.6* 8.1* 8.5*  --  8.4* 8.5*  MG 2.0  --   --  2.0 2.4 2.0 2.2  PHOS 3.9 4.7*  --   --   --   --   --    GFR: Estimated Creatinine Clearance: 9.2 mL/min (A) (by C-G formula based on SCr of 6.57 mg/dL (H)). Liver Function Tests: Recent Labs  Lab 07/02/18 0313 07/03/18 0649  ALBUMIN 2.3* 2.4*   No results for input(s): LIPASE, AMYLASE in the last 168 hours. No results for input(s): AMMONIA in the last 168 hours. Coagulation Profile: No results for input(s): INR, PROTIME in the last 168 hours. Cardiac Enzymes: No results for input(s): CKTOTAL, CKMB, CKMBINDEX, TROPONINI in the last 168 hours. BNP (last 3 results) No results for input(s): PROBNP in the last 8760 hours. HbA1C: No results for input(s): HGBA1C in the last 72 hours. CBG: Recent Labs  Lab 07/06/18 0657 07/06/18 1129 07/06/18 1602 07/06/18 2218 07/07/18 0653  GLUCAP 209* 212* 175* 219* 219*   Lipid Profile: No results for input(s): CHOL, HDL, LDLCALC, TRIG, CHOLHDL, LDLDIRECT in the last 72 hours. Thyroid Function Tests: No results for input(s): TSH, T4TOTAL, FREET4, T3FREE, THYROIDAB in the last 72  hours. Anemia Panel: No results for input(s): VITAMINB12, FOLATE, FERRITIN, TIBC, IRON, RETICCTPCT in the last 72 hours. Sepsis Labs: No results for input(s): PROCALCITON, LATICACIDVEN in the last 168 hours.  Recent Results (from the past 240 hour(s))  MRSA PCR Screening     Status: None   Collection Time: 07/04/18  3:29 AM  Result Value Ref Range Status   MRSA by PCR NEGATIVE NEGATIVE Final    Comment:        The GeneXpert MRSA Assay (FDA approved for NASAL specimens only), is one component of a comprehensive MRSA colonization surveillance program. It is not intended to diagnose MRSA infection nor to guide or monitor treatment for MRSA infections. Performed at [facility], [place] [address], [place], [place] [address]      Radiology Studies: No results found.  Scheduled Meds: . amLODipine  10 mg Oral Daily  . aspirin  81 mg Oral Daily  . chlorhexidine  15 mL Mouth Rinse BID  . Chlorhexidine Gluconate Cloth  6 each Topical Q0600  . darbepoetin (ARANESP) injection - DIALYSIS  150 mcg Intravenous Q Tue-HD  . feeding supplement (NEPRO CARB STEADY)  237 mL Oral BID BM  . feeding supplement (PRO-STAT SUGAR FREE 64)  30 mL Oral BID  . heparin  5,000 Units Subcutaneous Q8H  . insulin aspart  0-5 Units Subcutaneous QHS  . insulin aspart  0-9 Units Subcutaneous TID WC  . insulin detemir  10 Units Subcutaneous QHS  . levothyroxine  50 mcg Per Tube Q0600  . lisinopril  5 mg Oral Daily  . mouth rinse  15 mL Mouth Rinse BID  . metoprolol tartrate  100 mg Oral BID  . multivitamin  1 tablet Oral QHS  . nicotine  14 mg Transdermal Daily  . pantoprazole (PROTONIX) IV  40 mg Intravenous Daily  . sodium chloride flush  3 mL Intravenous Q12H   Continuous Infusions: . sodium chloride 10 mL/hr at 07/04/18 1800  . ferric gluconate (FERRLECIT/NULECIT) IV Stopped (07/03/18 1257)  . sodium chloride 250 mL (06/28/18 1511)     LOS: 13 days   Marylu Lund, MD [facility]  Pager On Amion  If 7PM-7AM, please contact night-coverage 07/07/2018, 10:39 AM

## 2018-07-07 NOTE — Patient Care Conference (Signed)
Tried to call patient's family member for update. No answer. Will try again later

## 2018-07-07 NOTE — Progress Notes (Signed)
[facility] ROUNDING NOTE   Subjective:   This is a 68 year old gentleman with a history of community-acquired pneumonia and sepsis and pulmonary infiltrates.  He required intubation.  He also had a V. tach cardiac arrest with CPR lasted about 5 minutes and IV amiodarone 06/27/2018.  He was intubated and extubated on 06/30/2018.  He received CVVHD 06/23/2020 06/26/2018.  Events noted for the early morning of 07/04/2018 patient developed torsades and became unresponsive.  He received chest compressions for 3 minutes and was transferred to the cardiac care unit.  He received magnesium and potassium.  Appreciate assistance from Dr. Shelva Majestic.  Blood pressure 135/63 pulse 77 temperature 98.1 O2 sats 97% room air  Dialysis removal of 2 L 07/05/2018 plan dialysis for 07/07/2018  Amlodipine 10 mg daily, aspirin 81 mg daily metoprolol 100 mg twice daily levothyroxine 50 mcg daily Protonix 40 mg daily Aranesp 150 mcg every 2 weeks, IV Nulicit load, lisinopril 5 mg daily  Blood pressure 149/62 pulse 77 temperature 98.1 O2 sats 95% room air  Chest x-ray showed improving pulmonary edema.  07/01/2018    Objective:  Vital signs in last 24 hours:  Temp:  [97.9 F (36.6 C)-98.3 F (36.8 C)] 98.1 F (36.7 C) (04/11 0656) Pulse Rate:  [67-81] 77 (04/11 0800) Resp:  [13-26] 19 (04/11 0800) BP: (105-162)/(36-79) 149/62 (04/11 0800) SpO2:  [95 %-98 %] 95 % (04/11 0800) Weight:  [65.4 kg] 65.4 kg (04/11 0300)  Weight change: -1.9 kg Filed Weights   07/05/18 1836 07/06/18 0616 07/07/18 0300  Weight: 65.3 kg 65 kg 65.4 kg    Intake/Output: I/O last 3 completed shifts: In: 540 [P.O.:540] Out: 90 [Urine:90]   Intake/Output this shift:  Total I/O In: 240 [P.O.:240] Out: -  Awake alert oriented no distress CVS- RRR RS- CTA no wheezes or rales ABD- BS present soft non-distended EXT- no edema AV fistula positive bruit  [facility]  4 hours 76.5 kg   2/2.25 bath   no heparin   right upper  arm AV fistula 400/800 Micera 200 mcg every 2 weeks Venofer 50 mg weekly    Basic Metabolic Panel: Recent Labs  Lab 07/02/18 0313 07/03/18 0649 07/03/18 1650 07/04/18 0204 07/04/18 2251 07/06/18 0409  NA 139 142 139 138  --  138  K 3.4* 3.3* 3.7 3.7 3.7 3.8  CL 103 104 99 100  --  97*  CO2 23 21* 26 23  --  25  GLUCOSE 130* 190* 240* 217*  --  218*  BUN 29* 38* 15 20  --  21  CREATININE 4.95* 6.30* 3.05* 4.05*  --  3.93*  CALCIUM 7.8* 7.6* 8.1* 8.5*  --  8.4*  MG 2.0  --   --  2.0 2.4 2.0  PHOS 3.9 4.7*  --   --   --   --     Liver Function Tests: Recent Labs  Lab 07/02/18 0313 07/03/18 0649  ALBUMIN 2.3* 2.4*   No results for input(s): LIPASE, AMYLASE in the last 168 hours. No results for input(s): AMMONIA in the last 168 hours.  CBC: Recent Labs  Lab 07/01/18 0227 07/02/18 0313 07/03/18 0649 07/04/18 0204  WBC 5.8 4.7 4.7 8.4  HGB 9.3* 9.1* 9.2* 11.4*  HCT 30.1* 28.8* 29.7* 35.7*  MCV 95.6 92.9 94.0 94.7  PLT 133* 179 221 332    Cardiac Enzymes: No results for input(s): CKTOTAL, CKMB, CKMBINDEX, TROPONINI in the last 168 hours.  BNP: Invalid input(s): POCBNP  CBG:  Recent Labs  Lab 07/06/18 0657 07/06/18 1129 07/06/18 1602 07/06/18 2218 07/07/18 0653  GLUCAP 209* 212* 175* 219* 219*    Microbiology: Results for orders placed or performed during the hospital encounter of 06/24/18  MRSA PCR Screening     Status: None   Collection Time: 06/24/18  4:12 AM  Result Value Ref Range Status   MRSA by PCR NEGATIVE NEGATIVE Final    Comment:        The GeneXpert MRSA Assay (FDA approved for NASAL specimens only), is one component of a comprehensive MRSA colonization surveillance program. It is not intended to diagnose MRSA infection nor to guide or monitor treatment for MRSA infections. Performed at [facility], [place] [address] [place], [place] [address]   Culture, blood (routine x 2) Call MD if unable to obtain prior to antibiotics  being given     Status: None   Collection Time: 06/24/18  6:39 AM  Result Value Ref Range Status   Specimen Description BLOOD A-LINE DRAW  Final   Special Requests   Final    BOTTLES DRAWN AEROBIC AND ANAEROBIC Blood Culture adequate volume   Culture   Final    NO GROWTH 5 DAYS Performed at [facility], [place] [address], [place], [place] [address]    Report Status 06/29/2018 FINAL  Final  Culture, respiratory (non-expectorated)     Status: None   Collection Time: 06/24/18  7:35 AM  Result Value Ref Range Status   Specimen Description TRACHEAL ASPIRATE  Final   Special Requests NONE  Final   Gram Stain   Final    MODERATE WBC PRESENT, PREDOMINANTLY PMN RARE BUDDING YEAST SEEN Performed at [facility], [place] [address], [place], [place] [address]    Culture ABUNDANT CANDIDA [place]  Final   Report Status 06/27/2018 FINAL  Final  Culture, blood (routine x 2)     Status: None   Collection Time: 06/24/18  8:01 AM  Result Value Ref Range Status   Specimen Description BLOOD CENTRAL LINE  Final   Special Requests   Final    BOTTLES DRAWN AEROBIC AND ANAEROBIC Blood Culture adequate volume   Culture   Final    NO GROWTH 5 DAYS Performed at [facility], [place] [address] [place], [place] [address]    Report Status 06/29/2018 FINAL  Final  MRSA PCR Screening     Status: None   Collection Time: 07/04/18  3:29 AM  Result Value Ref Range Status   MRSA by PCR NEGATIVE NEGATIVE Final    Comment:        The GeneXpert MRSA Assay (FDA approved for NASAL specimens only), is one component of a comprehensive MRSA colonization surveillance program. It is not intended to diagnose MRSA infection nor to guide or monitor treatment for MRSA infections. Performed at [facility], [place] [address], [place], [place] [address]     Coagulation Studies: No results for input(s): LABPROT, INR in the last 72 hours.  Urinalysis: No results for input(s): COLORURINE, LABSPEC,  PHURINE, GLUCOSEU, HGBUR, BILIRUBINUR, KETONESUR, PROTEINUR, UROBILINOGEN, NITRITE, LEUKOCYTESUR in the last 72 hours.  Invalid input(s): APPERANCEUR    Imaging: No results found.   Medications:   . sodium chloride 10 mL/hr at 07/04/18 1800  . ferric gluconate (FERRLECIT/NULECIT) IV Stopped (07/03/18 1257)  . sodium chloride 250 mL (06/28/18 1511)   . amLODipine  10 mg Oral Daily  . aspirin  81 mg Oral Daily  .  chlorhexidine  15 mL Mouth Rinse BID  . Chlorhexidine Gluconate Cloth  6 each Topical Q0600  . Chlorhexidine Gluconate Cloth  6 each Topical Q0600  . Chlorhexidine Gluconate Cloth  6 each Topical Q0600  . Chlorhexidine Gluconate Cloth  6 each Topical Q0600  . darbepoetin (ARANESP) injection - DIALYSIS  150 mcg Intravenous Q Tue-HD  . feeding supplement (NEPRO CARB STEADY)  237 mL Oral BID BM  . feeding supplement (PRO-STAT SUGAR FREE 64)  30 mL Oral BID  . heparin  5,000 Units Subcutaneous Q8H  . insulin aspart  0-5 Units Subcutaneous QHS  . insulin aspart  0-9 Units Subcutaneous TID WC  . insulin detemir  10 Units Subcutaneous QHS  . levothyroxine  50 mcg Per Tube Q0600  . lisinopril  5 mg Oral Daily  . mouth rinse  15 mL Mouth Rinse BID  . metoprolol tartrate  100 mg Oral BID  . multivitamin  1 tablet Oral QHS  . nicotine  14 mg Transdermal Daily  . pantoprazole (PROTONIX) IV  40 mg Intravenous Daily  . sodium chloride flush  3 mL Intravenous Q12H   sodium chloride, acetaminophen, hydrALAZINE, labetalol, sodium chloride, sodium chloride flush  Assessment/ Plan:   Community acquired pneumonia.  Intubated now extubated.  COVID-19 test negative.  Receiving IV antibiotics.  It appears his last dose was 06/30/2018 of Rocephin..  Azithromycin administered 06/23/2020 -  06/28/2018 chest x-ray improved.  But decreasing oxygen requirements.  End-stage renal disease receiving dialysis Tuesday Thursday Saturday next treatment 07/07/2018  VT cardiac arrest 06/27/2018.  Appreciate  assistance from Dr. Claiborne Billings and Dr. Lovena Le.  Patient with polymorphic ventricular tachycardia related possibly to QTC prolonging drugs including Compazine azithromycin and amiodarone.  Ventilator dependent respiratory failure extubated 06/30/2018  Hypertension/volume appears to be doing well will continue to challenge estimated dry weight encourage compliance with dialysis.  Appears to be tolerating his lisinopril well.  Increased dose of metoprolol and patient appears to have a better blood pressure.  anemia iron low T sats 10% started IV iron loading, darbepoetin to be administered 150 mcg 07/03/2018  Bones.  No binders at this present time  Nutrition stable at this present time we will continue to follow consider adding Protostat and vitamins  Diabetes mellitus sliding scale  Hypothyroidism on replacement therapy  Congestive heart failure nonischemic cardiomyopathy with an ejection fraction estimated at catheter 40%.  2D echo performed 07/07/2018 demonstrated EF of 60 to 65%.   LOS: [place] $RemoveBe'@TODAY'qlxKPnMwf$ $Rem'@8'XOwZ$ :44 AM

## 2018-07-08 LAB — BASIC METABOLIC PANEL
Anion gap: 18 — ABNORMAL HIGH (ref 5–15)
BUN: 25 mg/dL — ABNORMAL HIGH (ref 8–23)
CO2: 26 mmol/L (ref 22–32)
Calcium: 9.4 mg/dL (ref 8.9–10.3)
Chloride: 93 mmol/L — ABNORMAL LOW (ref 98–111)
Creatinine, Ser: 4.61 mg/dL — ABNORMAL HIGH (ref 0.61–1.24)
GFR calc Af Amer: 14 mL/min — ABNORMAL LOW (ref >60)
GFR calc non Af Amer: 12 mL/min — ABNORMAL LOW (ref >60)
Glucose, Bld: 217 mg/dL — ABNORMAL HIGH (ref 70–99)
Potassium: 4.4 mmol/L (ref 3.5–5.1)
Sodium: 137 mmol/L (ref 135–145)

## 2018-07-08 LAB — GLUCOSE, CAPILLARY
Glucose-Capillary: 210 mg/dL — ABNORMAL HIGH (ref 70–99)
Glucose-Capillary: 245 mg/dL — ABNORMAL HIGH (ref 70–99)
Glucose-Capillary: 255 mg/dL — ABNORMAL HIGH (ref 70–99)

## 2018-07-08 LAB — MAGNESIUM: Magnesium: 2.2 mg/dL (ref 1.7–2.4)

## 2018-07-08 MED ORDER — DARBEPOETIN ALFA 60 MCG/0.3ML IJ SOSY: 60.0000 ug | PREFILLED_SYRINGE | INTRAMUSCULAR | Status: DC

## 2018-07-08 MED ORDER — BISMUTH SUBSALICYLATE 262 MG/15ML PO SUSP
30.0000 mL | ORAL | Status: DC | PRN
  Administered 2018-07-08 – 2018-07-09 (×2): 30 mL via ORAL
  Filled 2018-07-08 (×2): qty 236

## 2018-07-08 NOTE — Progress Notes (Signed)
[facility] Progress Note   Dialysis Orders:  Center: [facility]  on TTS . EDW 76.5 HD Bath 2K/2.25Ca  Time 4 hrs Heparin none. Access RUE AVF BFR 400 DFR 800, Micera 200 mcg IV every 2 weeks, Venofer $RemoveBefore'50mg'dpjdsJqRJxBVq$  IV once per week     Assessment/Plan: 1. CAP with VDRF- required prev intubation, COVID neg, s/p antibioitics on supplemental O2 now 2. ESRD -TTS  K 4.4 next HD Tuesday -avoid low K 3. Anemia - hgb 11.3 Aranesp 150 given 4/7 on course of Fe for tsat 10% 4/4 - lowered aranesp dose for Tuesday hgb on upward trend - hold if hgb >11.5  4. Secondary hyperparathyroidism - no VDRA/binders -corrected Ca high - needs 2.25 or 2 K bath 5. HTN/volume volume    ok EF60 - 65% net UF 2.1 L 4/1 to 62.8 kg - if weights are correct this is dramatically lower that outpatient EDW - review of weights should downward trend - on high dose BB and low dose ACE 6. Nutrition - alb 2.8 - D2  diet + suppl/vits - unintentional weight loss - intake very poor- some degree of FTT 7. NSTEMI/ VT arrest 4/1 - cards following - possibly reatled to QTC prolonged meds - on high dose BB  8. DM - per primary  9. Hypothyroidism - on synthroid  Alexander Jacobson, PA-C [facility] [place] [phone] 07/08/2018,8:15 AM  LOS: 14 days   Subjective:   "i'm [place]...why don't you shut up"  Nursing reports ^ agitation with questioning refusing to eat much  Objective Vitals:   07/07/18 1646 07/07/18 1744 07/07/18 2100 07/07/18 2157  BP: (!) 100/51 (!) 120/52 (!) 109/53   Pulse: 93 86 86 92  Resp: $Remo'20 18 18   'wMKuj$ Temp: 98.2 F (36.8 C) 98.4 F (36.9 C) 98.1 F (36.7 C)   TempSrc: Oral Oral Oral   SpO2: 100% 97% 93%   Weight: 62.8 kg     Height:       Physical Exam General: thin elderly male breathing easily  Heart: bigeminy on tele Lungs: clear anteriorly Abdomen: soft NT Extremities: no edema/muscle wasting Dialysis Access: right upper AVF + bruit   Additional Objective Labs: Basic  Metabolic Panel: Recent Labs  Lab 07/02/18 0313 07/03/18 0649  07/07/18 0855 07/07/18 1117 07/08/18 0515  NA 139 142   < > 135 135 137  K 3.4* 3.3*   < > 3.8 3.9 4.4  CL 103 104   < > 94* 98 93*  CO2 23 21*   < > $R'25 22 26  'Kn$ GLUCOSE 130* 190*   < > 274* 272* 217*  BUN 29* 38*   < > 43* 45* 25*  CREATININE 4.95* 6.30*   < > 6.57* 6.42* 4.61*  CALCIUM 7.8* 7.6*   < > 8.5* 8.4* 9.4  PHOS 3.9 4.7*  --   --  4.0  --    < > = values in this interval not displayed.   Liver Function Tests: Recent Labs  Lab 07/02/18 0313 07/03/18 0649 07/07/18 1117  ALBUMIN 2.3* 2.4* 2.8*   No results for input(s): LIPASE, AMYLASE in the last 168 hours. CBC: Recent Labs  Lab 07/02/18 0313 07/03/18 0649 07/04/18 0204 07/07/18 1117  WBC 4.7 4.7 8.4 8.3  HGB 9.1* 9.2* 11.4* 11.3*  HCT 28.8* 29.7* 35.7* 35.6*  MCV 92.9 94.0 94.7 93.9  PLT 179 221 332 345   Blood Culture    Component Value Date/Time  SDES BLOOD CENTRAL LINE 06/24/2018 0801   SPECREQUEST  06/24/2018 0801    BOTTLES DRAWN AEROBIC AND ANAEROBIC Blood Culture adequate volume   CULT  06/24/2018 0801    NO GROWTH 5 DAYS Performed at [facility], [place] [address], [place], [place] [address]    REPTSTATUS 06/29/2018 FINAL 06/24/2018 0801    Cardiac Enzymes: No results for input(s): CKTOTAL, CKMB, CKMBINDEX, TROPONINI in the last 168 hours. CBG: Recent Labs  Lab 07/07/18 0653 07/07/18 1133 07/07/18 1743 07/07/18 2059 07/08/18 0737  GLUCAP 219* 271* 170* 158* 210*   Iron Studies: No results for input(s): IRON, TIBC, TRANSFERRIN, FERRITIN in the last 72 hours. Lab Results  Component Value Date   INR 1.19 02/06/2018   INR 1.01 01/18/2018   INR 1.09 03/14/2016   Studies/Results: No results found. Medications: . ferric gluconate (FERRLECIT/NULECIT) IV 125 mg (07/07/18 1400)  . sodium chloride 250 mL (06/28/18 1511)   . amLODipine  10 mg Oral Daily  . aspirin  81 mg Oral Daily  . chlorhexidine  15 mL Mouth  Rinse BID  . Chlorhexidine Gluconate Cloth  6 each Topical Q0600  . darbepoetin (ARANESP) injection - DIALYSIS  150 mcg Intravenous Q Tue-HD  . feeding supplement (NEPRO CARB STEADY)  237 mL Oral BID BM  . feeding supplement (PRO-STAT SUGAR FREE 64)  30 mL Oral BID  . heparin  5,000 Units Subcutaneous Q8H  . insulin aspart  0-5 Units Subcutaneous QHS  . insulin aspart  0-9 Units Subcutaneous TID WC  . insulin detemir  10 Units Subcutaneous QHS  . levothyroxine  50 mcg Per Tube Q0600  . lisinopril  5 mg Oral Daily  . mouth rinse  15 mL Mouth Rinse BID  . metoprolol tartrate  100 mg Oral BID  . multivitamin  1 tablet Oral QHS  . nicotine  14 mg Transdermal Daily  . pantoprazole (PROTONIX) IV  40 mg Intravenous Daily

## 2018-07-08 NOTE — Progress Notes (Signed)
PROGRESS NOTE    EATHEN BUDREAU  YQM:[id number] DOB: [birth year] DOA: 06/24/2018 PCP: [name], MD    Brief Narrative:  [name] 68 y/o with ESRD on HD, CVA, HTN, CHF sent from [facility] for fever 101.6 and obtunded state for which he was intubated.  CXR showed diffuse infiltrates- started on Vanc, Ceftriaxone and Zithro. Blood sugar of 638, HONK, started on Insulin infusion. Hypotensive, started on Levophed.   Admitted to ICU team.   Femoral aline 06/24/18 RIJ 06/24/18  3/31-NSTEMI-  Troponin 2.75- Cardiology consulted 4/1- A-fib with RVR started on Amio infusion- subsequently had ? Torsades vs VT- compression, defib x 1> ROSC 4/2- EP consulted- suspected QT (which was already prolonged) became more prolonged with Amio, Azithromycin and abnormal electrolytes 4/1- Cardiac cath > no significant ischemia 4/2 Limited ECHO- normal EF, no diastolic dysfunction- mild to mod TR 4/4 Extubated   Assessment & Plan:   Principal Problem:   Sepsis ([place]) Active Problems:   Acute diastolic congestive heart failure (HCC)   Diabetes mellitus, type 2 (HCC)   ESRD (end stage renal disease) ([place])   Essential hypertension   Respiratory failure with hypoxia (HCC)   Cardiac arrest with ventricular fibrillation (HCC)   Non-ST elevation (NSTEMI) myocardial infarction ([place])   History of CVA (cerebrovascular accident)   Acute on chronic combined systolic and diastolic CHF (congestive heart failure) (HCC)   Dysphagia   Anemia of chronic disease   Prolonged QT interval   Polymorphic ventricular tachycardia (HCC)   NSVT (nonsustained ventricular tachycardia) (HCC)   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 Per below    Cardiac arrest with ventricular fibrillation  - avoid QT prolonging meds - QTc over 600's on monitor - Recent torsades and symptomatic vtach -Cardiology following - avoid QT prolonging meds -Recently increased beta blocker    Acute diastolic congestive heart failure - CXR 4/3> Improving pulmonary edema pattern. Mild residual perihilar/infrahilar opacities. - continue with fluid management with dialysis - Nephrology is following  Dysphagia oropharyngeal phase after intubation - Continue with Dysphagia 2 diet per SLP    Diabetes mellitus, type 2  - uses Levemir 10 U at home - currently on SSI as needed    ESRD (end stage renal disease) - dialysis per nephrology - Remains stable at this time  Anemia due to chronic disease - appreciate renal management of this    Essential hypertension - on Lopressor, Amlodipine - Continue hydralazine IV PRN sbp>160 - Continue on PRN IV labetalol for added BP control -BP stable at this time  Hypothyroidism -cont Synthroid as tolerated -Remains stable at this time   GERD - on Protonix daily at home  -Currently stable  Hx CVA -Stable at present -PT/OT following  DVT prophylaxis: heparin subQ Code Status: Full Family Communication: Pt in room, family not at bedside, pt declined MD calling Disposition Plan: Uncertain at this time  Consultants:   Cardiology  Nephrology  Procedures:  1. The left ventricle has normal systolic function, with an ejection fraction of 60-65%. The cavity size was normal. There is mildly increased left ventricular wall thickness. No evidence  of left ventricular regional wall motion abnormalities. 2. The tricuspid valve was grossly normal. Tricuspid valve regurgitation is mild-moderate. 3. The aortic valve is tricuspid.  Antimicrobials: Anti-infectives (From  admission, onward)   Start     Dose/Rate Route Frequency Ordered Stop   06/28/18 1000  azithromycin (ZITHROMAX) 500 mg in sodium chloride 0.9 % 250 mL IVPB  Status:  Discontinued     500 mg 250 mL/hr over 60 Minutes Intravenous Every 24 hours 06/27/18 1007 06/28/18 0749   06/26/18 1200  vancomycin (VANCOCIN) IVPB 750 mg/150 ml premix  Status:  Discontinued     750 mg 150 mL/hr over 60 Minutes Intravenous Every T-Th-Sa (Hemodialysis) 06/24/18 0527 06/24/18 1316   06/24/18 2200  cefTRIAXone (ROCEPHIN) 1 g in sodium chloride 0.9 % 100 mL IVPB     1 g 200 mL/hr over 30 Minutes Intravenous Every 24 hours 06/24/18 0428 06/30/18 2153   06/24/18 2200  vancomycin (VANCOCIN) IVPB 750 mg/150 ml premix  Status:  Discontinued     750 mg 150 mL/hr over 60 Minutes Intravenous Every 24 hours 06/24/18 1316 06/26/18 0955   06/24/18 0500  azithromycin (ZITHROMAX) 500 mg in sodium chloride 0.9 % 250 mL IVPB  Status:  Discontinued     500 mg 250 mL/hr over 60 Minutes Intravenous Every 24 hours 06/24/18 0428 06/27/18 1007      Subjective: Mildly confused this AM. Denies chest pain  Objective: Vitals:   07/07/18 2100 07/07/18 2157 07/08/18 0833 07/08/18 0835  BP: (!) 109/53  126/60   Pulse: 86 92  73  Resp: 18     Temp: 98.1 F (36.7 C)     TempSrc: Oral     SpO2: 93%     Weight:      Height:        Intake/Output Summary (Last 24 hours) at 07/08/2018 1429 Last data filed at 07/08/2018 0849 Gross per 24 hour  Intake 240 ml  Output 2112 ml  Net -1872 ml   Filed Weights   07/07/18 0300 07/07/18 1231 07/07/18 1646  Weight: 65.4 kg 65 kg 62.8 kg    Examination: General exam: Awake, laying in bed, in nad Respiratory system: Normal respiratory effort, no wheezing Cardiovascular system: regular rate, s1, s2 Gastrointestinal system: Soft, nondistended, positive BS Central nervous system: CN2-12 grossly intact, strength intact Extremities: Perfused, no clubbing Skin: Normal skin turgor, no  notable skin lesions seen Psychiatry: confused at this time, difficult to assess  Data Reviewed: I have personally reviewed following labs and imaging studies  CBC: Recent Labs  Lab 07/02/18 0313 07/03/18 0649 07/04/18 0204 07/07/18 1117  WBC 4.7 4.7 8.4 8.3  HGB 9.1* 9.2* 11.4* 11.3*  HCT 28.8* 29.7* 35.7* 35.6*  MCV 92.9 94.0 94.7 93.9  PLT 179 221 332 656   Basic Metabolic Panel: Recent Labs  Lab 07/02/18 0313 07/03/18 0649  07/04/18 0204 07/04/18 2251 07/06/18 0409 07/07/18 0855 07/07/18 1117 07/08/18 0515  NA 139 142   < > 138  --  138 135 135 137  K 3.4* 3.3*   < > 3.7 3.7 3.8 3.8 3.9 4.4  CL 103 104   < > 100  --  97* 94* 98 93*  CO2 23 21*   < > 23  --  $R'25 25 22 26  'wd$ GLUCOSE 130* 190*   < > 217*  --  218* 274* 272* 217*  BUN 29* 38*   < > 20  --  21 43* 45* 25*  CREATININE  4.95* 6.30*   < > 4.05*  --  3.93* 6.57* 6.42* 4.61*  CALCIUM 7.8* 7.6*   < > 8.5*  --  8.4* 8.5* 8.4* 9.4  MG 2.0  --   --  2.0 2.4 2.0 2.2  --  2.2  PHOS 3.9 4.7*  --   --   --   --   --  4.0  --    < > = values in this interval not displayed.   GFR: Estimated Creatinine Clearance: 13.2 mL/min (A) (by C-G formula based on SCr of 4.61 mg/dL (H)). Liver Function Tests: Recent Labs  Lab 07/02/18 0313 07/03/18 0649 07/07/18 1117  ALBUMIN 2.3* 2.4* 2.8*   No results for input(s): LIPASE, AMYLASE in the last 168 hours. No results for input(s): AMMONIA in the last 168 hours. Coagulation Profile: No results for input(s): INR, PROTIME in the last 168 hours. Cardiac Enzymes: No results for input(s): CKTOTAL, CKMB, CKMBINDEX, TROPONINI in the last 168 hours. BNP (last 3 results) No results for input(s): PROBNP in the last 8760 hours. HbA1C: No results for input(s): HGBA1C in the last 72 hours. CBG: Recent Labs  Lab 07/07/18 1133 07/07/18 1743 07/07/18 2059 07/08/18 0737 07/08/18 1221  GLUCAP 271* 170* 158* 210* 245*   Lipid Profile: No results for input(s): CHOL, HDL, LDLCALC,  TRIG, CHOLHDL, LDLDIRECT in the last 72 hours. Thyroid Function Tests: No results for input(s): TSH, T4TOTAL, FREET4, T3FREE, THYROIDAB in the last 72 hours. Anemia Panel: No results for input(s): VITAMINB12, FOLATE, FERRITIN, TIBC, IRON, RETICCTPCT in the last 72 hours. Sepsis Labs: No results for input(s): PROCALCITON, LATICACIDVEN in the last 168 hours.  Recent Results (from the past 240 hour(s))  MRSA PCR Screening     Status: None   Collection Time: 07/04/18  3:29 AM  Result Value Ref Range Status   MRSA by PCR NEGATIVE NEGATIVE Final    Comment:        The GeneXpert MRSA Assay (FDA approved for NASAL specimens only), is one component of a comprehensive MRSA colonization surveillance program. It is not intended to diagnose MRSA infection nor to guide or monitor treatment for MRSA infections. Performed at [facility], [place] [address] [place], [place] [address]      Radiology Studies: No results found.  Scheduled Meds: . amLODipine  10 mg Oral Daily  . aspirin  81 mg Oral Daily  . chlorhexidine  15 mL Mouth Rinse BID  . Chlorhexidine Gluconate Cloth  6 each Topical Q0600  . [START ON 07/10/2018] darbepoetin (ARANESP) injection - DIALYSIS  60 mcg Intravenous Q Tue-HD  . feeding supplement (NEPRO CARB STEADY)  237 mL Oral BID BM  . feeding supplement (PRO-STAT SUGAR FREE 64)  30 mL Oral BID  . heparin  5,000 Units Subcutaneous Q8H  . insulin aspart  0-5 Units Subcutaneous QHS  . insulin aspart  0-9 Units Subcutaneous TID WC  . insulin detemir  10 Units Subcutaneous QHS  . levothyroxine  50 mcg Per Tube Q0600  . lisinopril  5 mg Oral Daily  . mouth rinse  15 mL Mouth Rinse BID  . metoprolol tartrate  100 mg Oral BID  . multivitamin  1 tablet Oral QHS  . nicotine  14 mg Transdermal Daily  . pantoprazole (PROTONIX) IV  40 mg Intravenous Daily   Continuous Infusions: . ferric gluconate (FERRLECIT/NULECIT) IV 125 mg (07/07/18 1400)  . sodium chloride 250 mL  (06/28/18 1511)     LOS: 14 days  Marylu Lund, MD [facility] Pager On Amion  If 7PM-7AM, please contact night-coverage 07/08/2018, 2:29 PM

## 2018-07-08 NOTE — Progress Notes (Signed)
Dr Camnitz's rounding note reviewed. Telemetry reviewed today, normal sinus rhythm without recurrence of ventricular rhythms. Continue to avoid QT prolonging agents. REpeat EKG today. Has been started on beta blocker and titrated to high dose. No additional recs today, please call with questions.    Carlyle Dolly MD

## 2018-07-08 NOTE — Patient Care Conference (Signed)
Called and updated pateint's wife. All questions answered

## 2018-07-09 ENCOUNTER — Encounter (HOSPITAL_COMMUNITY): Payer: Self-pay | Admitting: General Practice

## 2018-07-09 ENCOUNTER — Other Ambulatory Visit: Payer: Self-pay

## 2018-07-09 LAB — GLUCOSE, CAPILLARY
Glucose-Capillary: 328 mg/dL — ABNORMAL HIGH (ref 70–99)
Glucose-Capillary: 257 mg/dL — ABNORMAL HIGH (ref 70–99)
Glucose-Capillary: 395 mg/dL — ABNORMAL HIGH (ref 70–99)
Glucose-Capillary: 287 mg/dL — ABNORMAL HIGH (ref 70–99)

## 2018-07-09 LAB — BASIC METABOLIC PANEL
Anion gap: 19 — ABNORMAL HIGH (ref 5–15)
BUN: 52 mg/dL — ABNORMAL HIGH (ref 8–23)
CO2: 26 mmol/L (ref 22–32)
Calcium: 9.2 mg/dL (ref 8.9–10.3)
Chloride: 91 mmol/L — ABNORMAL LOW (ref 98–111)
Creatinine, Ser: 6.89 mg/dL — ABNORMAL HIGH (ref 0.61–1.24)
GFR calc Af Amer: 9 mL/min — ABNORMAL LOW (ref >60)
GFR calc non Af Amer: 7 mL/min — ABNORMAL LOW (ref >60)
Glucose, Bld: 273 mg/dL — ABNORMAL HIGH (ref 70–99)
Potassium: 4.3 mmol/L (ref 3.5–5.1)
Sodium: 136 mmol/L (ref 135–145)

## 2018-07-09 LAB — MAGNESIUM: Magnesium: 2.4 mg/dL (ref 1.7–2.4)

## 2018-07-09 MED ORDER — LIDOCAINE-PRILOCAINE 2.5-2.5 % EX CREA
1.0000 "application " | TOPICAL_CREAM | CUTANEOUS | Status: DC | PRN
  Filled 2018-07-09: qty 5

## 2018-07-09 MED ORDER — ASPIRIN 81 MG PO CHEW: 81.0000 mg | CHEWABLE_TABLET | Freq: Every day | ORAL | 0 refills | Status: AC

## 2018-07-09 MED ORDER — LISINOPRIL 5 MG PO TABS: 5.0000 mg | ORAL_TABLET | Freq: Every day | ORAL | 0 refills | Status: AC

## 2018-07-09 MED ORDER — PANTOPRAZOLE SODIUM 40 MG PO TBEC
40.0000 mg | DELAYED_RELEASE_TABLET | Freq: Every day | ORAL | Status: DC
  Administered 2018-07-09: 40 mg via ORAL
  Filled 2018-07-09: qty 1

## 2018-07-09 MED ORDER — LIDOCAINE HCL (PF) 1 % IJ SOLN: 5.0000 mL | INTRAMUSCULAR | Status: DC | PRN

## 2018-07-09 MED ORDER — SODIUM CHLORIDE 0.9 % IV SOLN: 100.0000 mL | INTRAVENOUS | Status: DC | PRN

## 2018-07-09 MED ORDER — METOPROLOL TARTRATE 100 MG PO TABS: 100.0000 mg | ORAL_TABLET | Freq: Two times a day (BID) | ORAL | 0 refills | Status: AC

## 2018-07-09 MED ORDER — PENTAFLUOROPROP-TETRAFLUOROETH EX AERO: 1.0000 "application " | INHALATION_SPRAY | CUTANEOUS | Status: DC | PRN

## 2018-07-09 MED FILL — LISINOPRIL 5 MG TABLET: 5 | 30 days supply | Qty: 30 | Fill #0

## 2018-07-09 MED FILL — ASPIRIN LOW DOSE 81 MG CHEW: 81 | 30 days supply | Qty: 30 | Fill #0

## 2018-07-09 MED FILL — METOPROLOL TARTRATE 100 MG: 100 | 30 days supply | Qty: 60 | Fill #0

## 2018-07-09 NOTE — Progress Notes (Signed)
Physical Therapy Treatment Patient Details Name: Alexander Whitaker MRN: [id number] DOB: 1950/06/20 Today's Date: 07/09/2018    History of Present Illness Pt is a 68 y.o. male admitted 06/24/18 with fever and AMS. CXR showed diffuse infiltrates. Worked up for sepsis, CAP, VDRF with hypoxia. 3/31 with NSTEMI. 4/1 afib with RVR subsequently had torsades vs VT, requiring compression and defibrillation. ETT 3/28-4/4. S/p cardiac cath showing nonobstructive disease. Transfer to ICU early 4/8 due to cardiac arrest with 3 mins of CPR with ROSC. PMH includes ESRD on HD, stroke, HTN, CHF.    PT Comments    Co session with OT based on prior notes. However, pt with great progress this session only requiring min assist for mobility, balance and stability. Pt continues to have right inattention with decreased awareness of safety and balance. Pt with limited activity tolerance who remains appropriate for acute therapy as well as additional skilled care in home setting. HR 73-88 with activity with BP stable 109/58    Follow Up Recommendations  Home health PT;Supervision/Assistance - 24 hour     Equipment Recommendations  Rolling walker with 5" wheels;3in1 (PT)    Recommendations for Other Services       Precautions / Restrictions Precautions Precautions: Fall Precaution Comments: rt inattention    Mobility  Bed Mobility Overal bed mobility: Needs Assistance Bed Mobility: Supine to Sit     Supine to sit: Min assist     General bed mobility comments: min HHA to elevate trunk from surface  Transfers Overall transfer level: Needs assistance   Transfers: Sit to/from Stand Sit to Stand: Min assist         General transfer comment: guarding for safety with cues for hand placement, assist to balance once rising. Assist to physically place RUE on RW and attend to throughout mobility. Pt x 3 trials from bed, chair, toilet  Ambulation/Gait Ambulation/Gait assistance: Min assist Gait Distance  (Feet): 10 Feet Assistive device: Rolling walker (2 wheeled) Gait Pattern/deviations: Step-through pattern;Decreased step length - right;Decreased step length - left;Shuffle;Trunk flexed   Gait velocity interpretation: <1.8 ft/sec, indicate of risk for recurrent falls General Gait Details: 6', 10', with RW assist to direct RW pt with tendency to pull toward left and pop right wheels off of ground.    Stairs             Wheelchair Mobility    Modified Rankin (Stroke Patients Only)       Balance Overall balance assessment: Needs assistance   Sitting balance-Leahy Scale: Fair Sitting balance - Comments: pt able to sit unsupported at toilet and chair without LOB guarding for safety   Standing balance support: Bilateral upper extremity supported Standing balance-Leahy Scale: Poor Standing balance comment: walker and min assist for static standing pt with 4 posterior LOB during static standing at sink                            Cognition Arousal/Alertness: Awake/alert Behavior During Therapy: Flat affect Overall Cognitive Status: No family/caregiver present to determine baseline cognitive functioning Area of Impairment: Orientation;Attention;Memory;Problem solving;Safety/judgement                 Orientation Level: Disoriented to;Time Current Attention Level: Sustained Memory: Decreased short-term memory Following Commands: Follows one step commands with increased time;Follows one step commands consistently Safety/Judgement: Decreased awareness of safety;Decreased awareness of deficits   Problem Solving: Decreased initiation;Slow processing;Requires verbal cues;Requires tactile cues General Comments: Pt with noted  inattention to right side, decreased overall attention and awareness of deficits. Asking what noise is when moving chair stating it sounded like a dog      Exercises      General Comments        Pertinent Vitals/Pain Pain Assessment:  No/denies pain    Home Living                      Prior Function            PT Goals (current goals can now be found in the care plan section) Progress towards PT goals: Progressing toward goals    Frequency    Min 3X/week      PT Plan Current plan remains appropriate    Co-evaluation PT/OT/SLP Co-Evaluation/Treatment: Yes Reason for Co-Treatment: For patient/therapist safety PT goals addressed during session: Mobility/safety with mobility;Balance;Proper use of DME        AM-PAC PT "6 Clicks" Mobility   Outcome Measure  Help needed turning from your back to your side while in a flat bed without using bedrails?: A Little Help needed moving from lying on your back to sitting on the side of a flat bed without using bedrails?: A Little Help needed moving to and from a bed to a chair (including a wheelchair)?: A Little Help needed standing up from a chair using your arms (e.g., wheelchair or bedside chair)?: A Little Help needed to walk in hospital room?: A Little Help needed climbing 3-5 steps with a railing? : A Lot 6 Click Score: 17    End of Session Equipment Utilized During Treatment: Gait belt Activity Tolerance: Patient limited by fatigue Patient left: in chair;with call bell/phone within reach;with chair alarm set Nurse Communication: Mobility status PT Visit Diagnosis: Unsteadiness on feet (R26.81);Muscle weakness (generalized) (M62.81);Other abnormalities of gait and mobility (R26.89)     Time: 0935-1005 PT Time Calculation (min) (ACUTE ONLY): 30 min  Charges:  $Gait Training: 8-22 mins                     [place], PT Acute Rehabilitation Services Pager: [phone] Office: [place] 07/09/2018, 10:21 AM

## 2018-07-09 NOTE — Progress Notes (Signed)
OP HD Clinic Manager notified of planned hospital discharge today with next scheduled HD treatment tomorrow in-center.   Alphonzo Cruise Dialysis Coordinator [phone]

## 2018-07-09 NOTE — TOC Transition Note (Signed)
Transition of Care [facility]) - CM/SW Discharge Note Marvetta Gibbons RN, BSN Transitions of Care Unit 4E- RN Case Manager [phone]   Patient Details  Name: Alexander Whitaker MRN: [id number] Date of Birth: 01-24-1951  Transition of Care [facility]) CM/SW Contact:  Dawayne Patricia, RN Phone Number: [phone] 07/09/2018, 3:49 PM   Clinical Narrative:    Pt stable for transition home today, orders for [facility] and DME have been placed- referrals have been previously placed per choice by previous CM- call made to Cassie with [facility] to confirm referral for HHPT/OT- start of care to be within 48 hr of transition home. Call also made to Brad with [facility] for DME needs- 3n1, hoyer lift, and RW to be delivered to the home as soon as possible. - per bedside RN, TOC to fill meds prior to discharge.    Final next level of care: [place] Barriers to Discharge: No Barriers Identified   Patient Goals and CMS Choice Patient states their goals for this hospitalization and ongoing recovery are:: "Pt is ready to return home" CMS Medicare.gov Compare Post Acute Care list provided to:: (Wife did not need- they had used [facility] in the past. ) Choice offered to / list presented to : Spouse(Patient has used [facility] in the past.)  Discharge Placement   Home with wife and [facility] services.                      Discharge Plan and Services In-house Referral: NA Discharge Planning Services: CM Consult Post Acute Care Choice: Durable Medical Equipment, Home Health          DME Arranged: 3-N-1, Other see comment(Hoyer Lift) DME Agency: [facility] HH Arranged: PT, OT HH Agency: [facility]   Social Determinants of Health (SDOH) Interventions     Readmission Risk Interventions Readmission Risk Prevention Plan 07/09/2018 11/24/2017  Transportation Screening - Complete  PCP follow-up - Complete  Medication Review Press photographer) Complete -  PCP or Specialist appointment  within 3-5 days of discharge Complete -  [place] or Home Care Consult Complete -  SW Recovery Care/Counseling Consult Complete -  Palliative Care Screening Not Applicable -  [place] Not Applicable -  Some recent data might be hidden

## 2018-07-09 NOTE — Progress Notes (Signed)
Occupational Therapy Treatment Patient Details Name: Alexander Whitaker MRN: [id number] DOB: [birth year] Today's Date: 07/09/2018    History of present illness Pt is a 68 y.o. male admitted 06/24/18 with fever and AMS. CXR showed diffuse infiltrates. Worked up for sepsis, CAP, VDRF with hypoxia. 3/31 with NSTEMI. 4/1 afib with RVR subsequently had torsades vs VT, requiring compression and defibrillation. ETT 3/28-4/4. S/p cardiac cath showing nonobstructive disease. Transfer to ICU early 4/8 due to cardiac arrest with 3 mins of CPR with ROSC. PMH includes ESRD on HD, stroke, HTN, CHF.   OT comments  Pt progressing towards established OT goals. However, pt continues to present with poor cognition and balance impacting his safety and functional performance. Pt requiring Mod A for grooming at sink to maintain static standing balance. Required Max cues during grooming due to decreased vision and cognition presenting with poor sequencing and problem solving. Pt unable to locate items in right visual field and with inattention to RUE and right side. Continue to recommend dc to post-acute rehab as pt is at high risk for falls. However, wife declining CIR/SNF and thus pt will need HHOT and 24/7 support. Will continue to follow acutely as admitted.     Follow Up Recommendations  CIR;Supervision/Assistance - 24 hour;SNF    Equipment Recommendations  3 in 1 bedside commode    Recommendations for Other Services      Precautions / Restrictions Precautions Precautions: Fall Precaution Comments: right inattention and poor awareness Restrictions Weight Bearing Restrictions: No       Mobility Bed Mobility Overal bed mobility: Needs Assistance Bed Mobility: Supine to Sit     Supine to sit: Min assist     General bed mobility comments: min HHA to elevate trunk from surface  Transfers Overall transfer level: Needs assistance Equipment used: Rolling walker (2 wheeled) Transfers: Sit to/from Stand Sit  to Stand: Min assist;+2 safety/equipment         General transfer comment: guarding for safety with cues for hand placement, assist to balance once rising. Assist to physically place RUE on RW and attend to throughout mobility. Pt x 3 trials from bed, chair, toilet    Balance Overall balance assessment: Needs assistance Sitting-balance support: Bilateral upper extremity supported Sitting balance-Leahy Scale: Fair Sitting balance - Comments: pt able to sit unsupported at toilet and chair without LOB guarding for safety   Standing balance support: Bilateral upper extremity supported Standing balance-Leahy Scale: Poor Standing balance comment: walker and Mod assist for static standing pt with 4 posterior LOB during static standing at sink                           ADL either performed or assessed with clinical judgement   ADL Overall ADL's : Needs assistance/impaired Eating/Feeding: Set up;Sitting;Supervision/ safety   Grooming: Moderate assistance;Sitting Grooming Details (indicate cue type and reason): Mod A for standing balance with several LOB posteriorly. Pt requiring increased time and cues throughout grooming. Pt with difficulty sequencing and initiating grooming task. Required cues to locate all grooming items in right visual field. Pt locating tooth paste and then going to put tooth paste in mouth since he could not find tooth brush (in right side). Pt picking up each items on sink (soap, room spray, etc) and stating "is this the tooth paste?"              Lower Body Dressing: Minimal assistance;Sit to/from stand Lower Body Dressing Details (indicate cue  type and reason): Pt donning his socks by bringing ankles to kness. Min A for donning right socks since he has wound at his second toe. Pt requiring Min A for posterior lean in standing Toilet Transfer: Minimal assistance;Ambulation;Moderate assistance;RW;Grab Information systems manager Details (indicate  cue type and reason): Min-Mod A for power up into standing from toilet. Poor RW management requiring Max cues Toileting- Clothing Manipulation and Hygiene: Moderate assistance;Sit to/from stand Toileting - Clothing Manipulation Details (indicate cue type and reason): Requiring Mod A for standing balance as pt performed peri care at [facility]     Functional mobility during ADLs: Moderate assistance;+2 for safety/equipment;Rolling walker;Cueing for sequencing General ADL Comments: Pt contineus to present with poor cognition, balance, and safety. At high risk for falls     Vision   Vision Assessment?: Yes;Vision impaired- to be further tested in functional context Tracking/Visual Pursuits: Requires cues, head turns, or add eye shifts to track;Unable to hold eye position out of midline;Impaired - to be further tested in functional context;Decreased smoothness of horizontal tracking;Decreased smoothness of vertical tracking Convergence: Impaired (comment)(Not following cues to perform) Visual Fields: Other (comment);Right visual field deficit;Impaired-to be further tested in functional context(right inattention) Diplopia Assessment: Other (comment)(denies diplopia) Depth Perception: Undershoots Additional Comments: Pt with difficulty tracking to right and required cues to look at pen in right visual field. Tracking to left. Pt undershooting with targeted reach as need during grooming. Will need to further assess   Perception     Praxis      Cognition Arousal/Alertness: Awake/alert Behavior During Therapy: Flat affect Overall Cognitive Status: No family/caregiver present to determine baseline cognitive functioning Area of Impairment: Orientation;Attention;Memory;Problem solving;Safety/judgement                 Orientation Level: Disoriented to;Time Current Attention Level: Sustained Memory: Decreased short-term memory Following Commands: Follows one step commands with increased time;Follows  one step commands inconsistently Safety/Judgement: Decreased awareness of safety;Decreased awareness of deficits Awareness: Intellectual Problem Solving: Decreased initiation;Slow processing;Requires verbal cues;Requires tactile cues General Comments: Pt with poor attention to right and required cues throughout to locate items on right side of visual field. Pt with poor awareness and problem solving.         Exercises     Shoulder Instructions       General Comments VSS. HR 73-88 with activity with BP stable 109/58    Pertinent Vitals/ Pain       Pain Assessment: No/denies pain Faces Pain Scale: No hurt  Home Living Family/patient expects to be discharged to:: Private residence Living Arrangements: Spouse/significant other                                      Prior Functioning/Environment              Frequency  Min 2X/week        Progress Toward Goals  OT Goals(current goals can now be found in the care plan section)  Progress towards OT goals: Progressing toward goals  Acute Rehab OT Goals Patient Stated Goal: go home with [name] OT Goal Formulation: With patient Time For Goal Achievement: 07/16/18 Potential to Achieve Goals: Good ADL Goals Pt Will Perform Grooming: with min guard assist;standing Pt Will Perform Upper Body Dressing: with set-up;with supervision;sitting Pt Will Perform Lower Body Dressing: with min assist;sit to/from stand Pt Will Transfer to Toilet: with min guard assist;ambulating;bedside commode Pt  Will Perform Toileting - Clothing Manipulation and hygiene: with min assist;sit to/from stand Additional ADL Goal #1: Pt will perform bed mobility with supervision in preparation for ADL.  Plan Discharge plan remains appropriate;Frequency remains appropriate    Co-evaluation    PT/OT/SLP Co-Evaluation/Treatment: Yes Reason for Co-Treatment: Complexity of the patient's impairments (multi-system involvement);Necessary  to address cognition/behavior during functional activity;For patient/therapist safety PT goals addressed during session: Mobility/safety with mobility;Balance;Proper use of DME OT goals addressed during session: ADL's and self-care      AM-PAC OT "6 Clicks" Daily Activity     Outcome Measure   Help from another person eating meals?: A Lot Help from another person taking care of personal grooming?: A Lot Help from another person toileting, which includes using toliet, bedpan, or urinal?: A Lot Help from another person bathing (including washing, rinsing, drying)?: A Lot Help from another person to put on and taking off regular upper body clothing?: A Lot Help from another person to put on and taking off regular lower body clothing?: A Lot 6 Click Score: 12    End of Session Equipment Utilized During Treatment: Gait belt;Rolling walker  OT Visit Diagnosis: Unsteadiness on feet (R26.81);Muscle weakness (generalized) (M62.81);Other symptoms and signs involving cognitive function   Activity Tolerance Patient limited by fatigue   Patient Left with call bell/phone within reach;in chair;with chair alarm set   Nurse Communication Mobility status        Time: 5670-1410 OT Time Calculation (min): 32 min  Charges: OT General Charges $OT Visit: 1 Visit OT Treatments $Self Care/Home Management : 8-22 mins  [place], OTR/L Acute Rehab Pager: [phone] Office: [place] 07/09/2018, 12:01 PM

## 2018-07-09 NOTE — Progress Notes (Addendum)
Inpatient Diabetes Program Recommendations  AACE/ADA: New Consensus Statement on Inpatient Glycemic Control (2015)  Target Ranges:  Prepandial:   less than 140 mg/dL      Peak postprandial:   less than 180 mg/dL (1-2 hours)      Critically ill patients:  140 - 180 mg/dL   Lab Results  Component Value Date   GLUCAP 395 (H) 07/09/2018   HGBA1C 9.1 (H) 11/21/2017    Diabetes history:DM 2 Outpatient Diabetes medications:Levemir 10 units daily (patient taking Levemir 50 units daily per medication reconciliation) Current orders for Inpatient glycemic control: Novolog sensitive tid with meals and HS, Levemir 10 units daily Inpatient Diabetes Program Recommendations:  Consider increasing Levemir to 14 units daily.  Also, post prandials exceeding inpatient goals >180 mg/dL, consider adding Novolog 3 units TID (assuming patient is consuming >50% of meals)- OR- could alter dysphagia diet to carb modified.  Thanks, Bronson Curb, MSN, RNC-OB Diabetes Coordinator [phone] (8a-5p)

## 2018-07-09 NOTE — Care Management Important Message (Signed)
Important Message  Patient Details  Name: Alexander Whitaker MRN: [id number] Date of Birth: 1950/04/03   Medicare Important Message Given:  Yes    Orbie Pyo 07/09/2018, 4:22 PM

## 2018-07-09 NOTE — Progress Notes (Addendum)
[facility] Progress Note   Subjective: Up in recliner, sliding out of chair. Oriented X 2. No C/Os.     Objective Vitals:   07/08/18 1728 07/08/18 2100 07/09/18 0453 07/09/18 0948  BP: (!) 103/55 (!) 118/53 (!) 95/53 (!) 109/58  Pulse: 72 71 67 88  Resp: $Remo'18 18 17   'caufI$ Temp: 98 F (36.7 C) 98.3 F (36.8 C)    TempSrc: Oral Axillary    SpO2: 99% 97% 98%   Weight:   63.4 kg   Height:       Physical Exam General: Pleasant older male in NAD Heart: S1,S2 No M/G/R. SR on monitor.  Lungs: CTAB A/P Abdomen: Active BS Extremities: No LE edema.  Dialysis Access:  R AVF + T/B  Additional Objective Labs: Basic Metabolic Panel: Recent Labs  Lab 07/03/18 0649  07/07/18 1117 07/08/18 0515 07/09/18 0545  NA 142   < > 135 137 136  K 3.3*   < > 3.9 4.4 4.3  CL 104   < > 98 93* 91*  CO2 21*   < > $R'22 26 26  'Ep$ GLUCOSE 190*   < > 272* 217* 273*  BUN 38*   < > 45* 25* 52*  CREATININE 6.30*   < > 6.42* 4.61* 6.89*  CALCIUM 7.6*   < > 8.4* 9.4 9.2  PHOS 4.7*  --  4.0  --   --    < > = values in this interval not displayed.   Liver Function Tests: Recent Labs  Lab 07/03/18 0649 07/07/18 1117  ALBUMIN 2.4* 2.8*   No results for input(s): LIPASE, AMYLASE in the last 168 hours. CBC: Recent Labs  Lab 07/03/18 0649 07/04/18 0204 07/07/18 1117  WBC 4.7 8.4 8.3  HGB 9.2* 11.4* 11.3*  HCT 29.7* 35.7* 35.6*  MCV 94.0 94.7 93.9  PLT 221 332 345   Blood Culture    Component Value Date/Time   SDES BLOOD CENTRAL LINE 06/24/2018 0801   SPECREQUEST  06/24/2018 0801    BOTTLES DRAWN AEROBIC AND ANAEROBIC Blood Culture adequate volume   CULT  06/24/2018 0801    NO GROWTH 5 DAYS Performed at [facility], [place] [address], [place], [place] [address]    REPTSTATUS 06/29/2018 FINAL 06/24/2018 0801    Cardiac Enzymes: No results for input(s): CKTOTAL, CKMB, CKMBINDEX, TROPONINI in the last 168 hours. CBG: Recent Labs  Lab 07/08/18 1221 07/08/18 1730  07/08/18 2124 07/09/18 0746 07/09/18 1139  GLUCAP 245* 255* 328* 257* 395*   Iron Studies: No results for input(s): IRON, TIBC, TRANSFERRIN, FERRITIN in the last 72 hours. $RemoveB'@lablastinr3'oXgcCOLv$ @ Studies/Results: No results found. Medications: . ferric gluconate (FERRLECIT/NULECIT) IV 125 mg (07/07/18 1400)  . sodium chloride 250 mL (06/28/18 1511)   . amLODipine  10 mg Oral Daily  . aspirin  81 mg Oral Daily  . chlorhexidine  15 mL Mouth Rinse BID  . Chlorhexidine Gluconate Cloth  6 each Topical Q0600  . [START ON 07/10/2018] darbepoetin (ARANESP) injection - DIALYSIS  60 mcg Intravenous Q Tue-HD  . feeding supplement (NEPRO CARB STEADY)  237 mL Oral BID BM  . feeding supplement (PRO-STAT SUGAR FREE 64)  30 mL Oral BID  . heparin  5,000 Units Subcutaneous Q8H  . insulin aspart  0-5 Units Subcutaneous QHS  . insulin aspart  0-9 Units Subcutaneous TID WC  . insulin detemir  10 Units Subcutaneous QHS  . levothyroxine  50 mcg Per Tube Q0600  . lisinopril  5  mg Oral Daily  . mouth rinse  15 mL Mouth Rinse BID  . metoprolol tartrate  100 mg Oral BID  . multivitamin  1 tablet Oral QHS  . nicotine  14 mg Transdermal Daily  . pantoprazole  40 mg Oral Daily     Dialysis Orders: [facility] T,Th,S 4 hrs 180NRe 400/Auto 1.5 76.5 kg 2.0 K/ 2.25 Ca UFP 4 -No heparin  -Venofer 50 mg IV weekly (last dose 06/21/18)  Assessment/Plan: 1. CAP with VDRF- required prev intubation, COVID neg, s/p antibioitics on supplemental O2 now 2. ESRD -TTS  K 4.4 next HD Tuesday. No Heparin.  3. Anemia - hgb 11.3 Aranesp 150 given 4/7 on course of Fe for tsat 10% 4/4 - lowered aranesp dose for Tuesday hgb on upward trend - hold if hgb >11.5  4. Secondary hyperparathyroidism - no VDRA/binders Phos at goal-Ca 9.2 C Ca 10.2-Change to 2.0 Ca bath.  5. HTN/volume volume HD 07/07/18 pre wt 65 kg Net UF 2112 Post wt 62.8 kg. Will need lower EDW on DC. BP controlled. Minimal UF with HD tomorrow. on high dose BB and low dose  ACE 6. Nutrition - alb 2.8 - D2  diet + suppl/vits - unintentional weight loss - intake very poor- some degree of FTT 7. NSTEMI/ VT arrest 4/1 - cards following - possibly reatled to QTC prolonged meds - on high dose BB. DC benadryl/meds that prolong QTc  8. DM - per primary  9. Hypothyroidism - on synthroid  Rita H. Brown NP-C 07/09/2018, 11:56 AM  [facility] [phone]  Pt seen, examined and agree w A/P as above.  [facility] 07/09/2018, 3:13 PM

## 2018-07-09 NOTE — Discharge Summary (Addendum)
Physician Discharge Summary  Alexander Whitaker DXA:[id number] DOB: [birth year] DOA: 06/24/2018  PCP: [name], MD  Admit date: 06/24/2018 Discharge date: 07/09/2018  Admitted From: Home Disposition:  Home  Recommendations for Outpatient Follow-up:  1. Follow up with PCP in 1-2 weeks  Home Health:PT, OT  Equipment/Devices:RW, hoyer lift, 3 in 1 commode    Discharge Condition:Stable CODE STATUS:Full Diet recommendation: Diabetic, Heart healthy   Brief/Interim Summary: 68 y/o with ESRD on HD, CVA, HTN, CHF sent from [facility] for fever 101.6 and obtunded state for which he was intubated. CXR showed diffuse infiltrates- started on Vanc, Ceftriaxone and Zithro. Blood sugar of 638, HONK, started on Insulin infusion. Hypotensive, started on Levophed.  Admitted initially to ICU team.  Femoral aline 06/24/18 RIJ 06/24/18  3/31-NSTEMI- Troponin 2.75- Cardiology consulted 4/1- A-fib with RVR started on Amio infusion- subsequently had ? Torsades vs VT- compression, defib x 1> ROSC 4/2- EP consulted- suspected QT (which was already prolonged) became more prolonged with Amio, Azithromycin and abnormal electrolytes 4/1- Cardiac cath > no significant ischemia 4/2 Limited ECHO- normal EF, no diastolic dysfunction- mild to mod TR 4/4 Extubated   Discharge Diagnoses:  Principal Problem:   Sepsis ([place]) Active Problems:   Acute diastolic congestive heart failure (HCC)   Diabetes mellitus, type 2 (HCC)   ESRD (end stage renal disease) ([place])   Essential hypertension   Respiratory failure with hypoxia (HCC)   Cardiac arrest with ventricular fibrillation (HCC)   Non-ST elevation (NSTEMI) myocardial infarction ([place])   History of CVA (cerebrovascular accident)   Acute on chronic combined systolic and diastolic CHF (congestive heart failure) (HCC)   Dysphagia   Anemia of chronic disease   Prolonged QT interval   Polymorphic ventricular tachycardia (HCC)   NSVT (nonsustained  ventricular tachycardia) (HCC)   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   Type 1 diabetes mellitus with complications ([place])   Nonischemic cardiomyopathy ([place])  Principal Problem: Sepsis- CAP- VDRFwith hypoxia  - Ruled out for COVID - completed Ceftriaxonefor CAP - remains stable at this time  Active Problems: Non-ST elevation (NSTEMI) myocardial infarction - Cath showing normal coronaries - Cardiology had been following, since signed off -On ASA, increased beta blocker  Cardiac arrest with ventricular fibrillation  - avoid QT prolonging meds - QTc over 600's on monitor - Recent torsades and symptomatic vtach -Cardiology following - avoid QT prolonging meds -Recently increased beta blocker to $RemoveBe'100mg'IXvziliQs$  bid  Acute diastolic congestive heart failure - CXR 4/3> Improving pulmonary edema pattern. Mild residual perihilar/infrahilar opacities. - continue with fluid management with dialysis - Nephrology is following -Next HD on 4/14  Dysphagiaoropharyngeal phaseafter intubation - Continue with Dysphagia 2 diet per SLP  Diabetes mellitus, type 2  - uses Levemir 10 U at home - currently on SSI as needed  ESRD (end stage renal disease) - dialysis per nephrology - Remains stable at this time  Anemia due to chronic disease - appreciate renal management of this  Essential hypertension - on Lopressor, Amlodipine, lisinopril -BP stable at this time  Hypothyroidism -cont Synthroid as tolerated -Remains stable at this time  GERD - on Protonix daily at home  -Currently stable  Hx CVA -Stable at present -PT/OT following -Patient wants to go home. Home health PT/OT   Discharge Instructions   Allergies as of 07/09/2018   No Known Allergies     Medication List    STOP taking these medications   amLODipine 10 MG tablet Commonly known as:  NORVASC   clonazePAM 2 MG tablet Commonly known as:  KLONOPIN    escitalopram 10 MG tablet Commonly known as:  LEXAPRO   famotidine 40 MG tablet Commonly known as:  PEPCID   gabapentin 300 MG capsule Commonly known as:  NEURONTIN   hydrOXYzine 25 MG tablet Commonly known as:  ATARAX/VISTARIL   Melatonin 10 MG Caps   ondansetron 4 MG tablet Commonly known as:  ZOFRAN   pramipexole 0.5 MG tablet Commonly known as:  MIRAPEX     TAKE these medications   albuterol 108 (90 Base) MCG/ACT inhaler Commonly known as:  PROVENTIL HFA;VENTOLIN HFA Inhale 2 puffs into the lungs every 6 (six) hours as needed for wheezing or shortness of breath.   aspirin 81 MG chewable tablet Chew 1 tablet (81 mg total) by mouth daily for 30 days. Start taking on:  July 10, 2018   camphor-menthol lotion Commonly known as:  SARNA Apply 1 application topically as needed for itching.   diclofenac sodium 1 % Gel Commonly known as:  VOLTAREN Apply 2 g topically 3 (three) times daily as needed for pain. knees   Insulin Detemir 100 UNIT/ML Pen Commonly known as:  LEVEMIR Inject 10 Units into the skin daily at 12 noon. What changed:  how much to take   levothyroxine 50 MCG tablet Commonly known as:  SYNTHROID, LEVOTHROID Take 50 mcg by mouth daily before breakfast.   lidocaine-prilocaine cream Commonly known as:  EMLA Apply 1 application topically See admin instructions. Apply small amount to access site 1 to 2 hours before dialysis.   lisinopril 5 MG tablet Commonly known as:  PRINIVIL,ZESTRIL Take 1 tablet (5 mg total) by mouth daily for 30 days. Start taking on:  July 10, 2018   loratadine 10 MG tablet Commonly known as:  CLARITIN Take 10 mg by mouth daily as needed for rhinitis.   metoprolol tartrate 100 MG tablet Commonly known as:  LOPRESSOR Take 1 tablet (100 mg total) by mouth 2 (two) times daily for 30 days.   pantoprazole 40 MG tablet Commonly known as:  PROTONIX Take 40 mg by mouth daily.            Durable Medical Equipment  (From  admission, onward)         Start     Ordered   07/09/18 1434  For home use only DME Other see comment  Once    Comments:  Harrel Lemon lift   07/09/18 1434   07/09/18 1434  For home use only DME Walker rolling  Once    Question:  Patient needs a walker to treat with the following condition  Answer:  CVA (cerebral vascular accident) ([place])   07/09/18 1434   07/09/18 1433  For home use only DME 3 n 1  Once     07/09/18 1434         Follow-up [place], [facility] Follow up.   Specialty:  Home Health Services Why:  Physical Therapy & Occupational Therapy Contact information: [place] [address] [phone]        [facility] Follow up.   Why:  Rolling Jabier Gauss LIft, 3n1       [name], MD. Schedule an appointment as soon as possible for a visit in 1 week(s).   Specialty:  Internal Medicine Contact information: [address] [place] [address] [phone]        Leonie Man, MD .  Specialty:  Cardiology Contact information: [address] [place] [address] [phone]          No Known Allergies  Consultations:  Cardiology  Nephrology  Procedures/Studies: Dg Abd 1 View  Result Date: 06/27/2018 CLINICAL DATA:  Respiratory failure, abdominal distension EXAM: ABDOMEN - 1 VIEW COMPARISON:  None. FINDINGS: Endotracheal tube with tip in the gastric body. Side port just below the GE junction. No dilated to large or small bowel. Posterior lumbar fusion. IMPRESSION: 1. No evidence of bowel obstruction. 2. NG tube in stomach. Electronically Signed   By: Suzy Bouchard M.D.   On: 06/27/2018 07:37   Dg Abd 1 View  Result Date: 06/24/2018 CLINICAL DATA:  Orogastric tube placement. EXAM: ABDOMEN - 1 VIEW COMPARISON:  None. FINDINGS: The bowel gas pattern is normal. Distal tip of nasogastric tube appears to be in distal esophagus. No radio-opaque calculi or other  significant radiographic abnormality are seen. IMPRESSION: Distal tip of nasogastric tube appears to be in distal esophagus; advancement is recommended. No evidence of bowel obstruction or ileus. Electronically Signed   By: Marijo Conception, M.D.   On: 06/24/2018 08:54   Dg Chest Port 1 View  Result Date: 06/29/2018 CLINICAL DATA:  ET tube, respiratory failure EXAM: PORTABLE CHEST 1 VIEW COMPARISON:  06/28/2018 FINDINGS: Support devices are stable. Heart is borderline in size. Improving pulmonary edema pattern with mild residual perihilar and infrahilar opacities. No visible effusions or acute bony abnormality. IMPRESSION: Improving pulmonary edema pattern. Mild residual perihilar/infrahilar opacities. Electronically Signed   By: Rolm Baptise M.D.   On: 06/29/2018 07:25   Dg Chest Port 1 View  Result Date: 06/28/2018 CLINICAL DATA:  68 year old male intubated with respiratory failure EXAM: PORTABLE CHEST 1 VIEW COMPARISON:  Prior chest x-ray 06/27/2018 FINDINGS: The tip of the endotracheal tube is 4.7 cm above the carina. A nasogastric tube is present. The tip lies off the field of view, presumably within the stomach. Stable position of right IJ non tunneled hemodialysis catheter with the tip overlying the mid ventricle. No new support apparatus. The external defibrillator pads have been removed. Stable mild cardiomegaly and pulmonary vascular congestion bordering on mild interstitial edema. No significant interval change in the pulmonary parenchymal pattern. Suspect layering right pleural effusion. No pneumothorax. No new airspace opacities. No acute osseous abnormality. IMPRESSION: No significant interval change in the appearance of the chest over the last 24 hours. Persistent cardiomegaly and mild pulmonary edema with a probable small layering right pleural effusion. Stable and satisfactory support apparatus. Electronically Signed   By: Jacqulynn Cadet M.D.   On: 06/28/2018 07:46   Dg Chest Port 1  View  Result Date: 06/27/2018 CLINICAL DATA:  Respiratory failure EXAM: PORTABLE CHEST 1 VIEW COMPARISON:  06/26/2018 FINDINGS: Support devices are stable. Mild cardiomegaly and vascular congestion. Slight right base atelectasis, improving since prior study. No confluent opacity on the left. No effusions or acute bony abnormality. IMPRESSION: Mild cardiomegaly.  Improving right base atelectasis. Electronically Signed   By: Rolm Baptise M.D.   On: 06/27/2018 07:33   Dg Chest Port 1 View  Result Date: 06/26/2018 CLINICAL DATA:  Respiratory failure EXAM: PORTABLE CHEST 1 VIEW COMPARISON:  06/25/2018 FINDINGS: Endotracheal tube, gastric catheter and right jugular central line are again seen and stable. No pneumothorax is noted. Mild central vascular congestion is noted. Some patchy opacity is noted in the right lung base likely representing developing atelectasis. This is somewhat accentuated by low overlying extrinsic artifact. No acute  bony abnormality is noted. IMPRESSION: Slight increase in right basilar atelectatic changes. Tubes and lines as described. Electronically Signed   By: Inez Catalina M.D.   On: 06/26/2018 07:42   Dg Chest Port 1 View  Result Date: 06/25/2018 CLINICAL DATA:  68 year old male with acute respiratory failure EXAM: PORTABLE CHEST 1 VIEW COMPARISON:  Prior chest x-ray 06/24/2018 FINDINGS: The patient remains intubated. The tip of the endotracheal tube is 5.5 cm above the carina. A right IJ non tunneled hemodialysis catheter remains in stable position with the tip overlying the upper SVC. Stable enlargement of the cardiopericardial silhouette. Perihilar and bibasilar interstitial and airspace opacities are present along with pulmonary vascular congestion most consistent with pulmonary edema. Similar appearance of multifocal right-sided rib fractures. Atherosclerotic calcifications again noted in the transverse aorta. The upper abdominal bowel gas pattern is unremarkable. IMPRESSION: 1.  The tip of the endotracheal tube is 5.5 cm above the carina. 2. The tip of the right IJ non tunneled hemodialysis catheter overlies the upper SVC. 3. Stable cardiomegaly and mild pulmonary edema. Electronically Signed   By: Jacqulynn Cadet M.D.   On: 06/25/2018 07:45   Dg Chest Port 1 View  Result Date: 06/24/2018 CLINICAL DATA:  Evaluate central line placement EXAM: PORTABLE CHEST 1 VIEW COMPARISON:  Earlier same day FINDINGS: Endotracheal tube tip is 4 cm above the carina. New right internal jugular central line tip is in the SVC at the azygos level. Patchy bilateral pulmonary infiltrates persist. No other new finding. IMPRESSION: New right internal jugular central line tip in the SVC at the azygos level. Electronically Signed   By: Nelson Chimes M.D.   On: 06/24/2018 18:40   Dg Chest Port 1 View  Result Date: 06/24/2018 CLINICAL DATA:  Respiratory distress. EXAM: PORTABLE CHEST 1 VIEW COMPARISON:  Radiograph of June 23, 2018. FINDINGS: Stable cardiomediastinal silhouette. Endotracheal and nasogastric tubes are unchanged in position. Interval placement of right internal jugular catheter with distal tip looped in superior vena cava. No pneumothorax or pleural effusion is noted. Stable mild left lingular opacity is noted as well as right perihilar opacity, concerning for edema or pneumonia. Bony thorax is unremarkable. IMPRESSION: Interval placement of right internal jugular catheter with distal tip looped within superior vena cava. No pneumothorax is noted. Stable bilateral lung opacities are noted concerning for pneumonia or possibly edema. Electronically Signed   By: Marijo Conception, M.D.   On: 06/24/2018 08:52   Dg Abd Portable 1v  Result Date: 06/25/2018 CLINICAL DATA:  NG tube placement. EXAM: PORTABLE ABDOMEN - 1 VIEW COMPARISON:  None. FINDINGS: NG tube tip lies along the greater curvature of the stomach. Gas pattern is nonspecific. Lumbar spine hardware is stable. IMPRESSION: NG tube tip lies  along the greater curvature of the stomach. Electronically Signed   By: Staci Righter M.D.   On: 06/25/2018 19:09   Dg Swallowing Func-speech Pathology  Result Date: 07/01/2018 Objective Swallowing Evaluation: Type of Study: MBS-Modified Barium Swallow Study  Patient Details Name: Alexander Whitaker MRN: [id number] Date of Birth: 10/11/1950 Today's Date: 07/01/2018 Time: SLP Start Time (ACUTE ONLY): 1315 -SLP Stop Time (ACUTE ONLY): 1330 SLP Time Calculation (min) (ACUTE ONLY): 15 min Past Medical History: Past Medical History: Diagnosis Date . Anemia  . Arthritis   per pt (11/21/2017) . CHF (congestive heart failure) ([place]) ~ 2017 . CKD stage 3 due to type 2 diabetes mellitus ([place])   stage 5 Dialysis T/Th/Sa . CVA (cerebral vascular accident) [facility]) 2007  wife  denies residual on 11/21/2017 . Diabetic peripheral neuropathy ([place])  . Dyspnea  . ESRD (end stage renal disease) on dialysis ([place])   "[place]; TTS" (11/21/2017) . GERD (gastroesophageal reflux disease)   has in the past . Headache   since going on dialysis . Hypertension  . Hypothyroidism  . PONV (postoperative nausea and vomiting)  . Restless legs  . Type II diabetes mellitus ([place])  Past Surgical History: Past Surgical History: Procedure Laterality Date . AV FISTULA PLACEMENT Right 10/07/2016  Procedure: RIGHT BRACHIOCEPHALIC ARTERIOVENOUS (AV) FISTULA CREATION;  Surgeon: Angelia Mould, MD;  Location: [facility];  Service: Vascular;  Laterality: Right; . BACK SURGERY   . CATARACT EXTRACTION W/ INTRAOCULAR LENS IMPLANT Left  . CIRCUMCISION  2015 . HERNIA REPAIR  2016  "stomach" . LEFT HEART CATH AND CORONARY ANGIOGRAPHY N/A 06/27/2018  Procedure: LEFT HEART CATH AND CORONARY ANGIOGRAPHY;  Surgeon: Troy Sine, MD;  Location: [facility];  Service: Cardiovascular;  Laterality: N/A; . POSTERIOR LUMBAR FUSION  1990s?  "has bolts and rods in there" HPI: 68 year old male with history of esrd on hd, cva, htn, chf, presented to [facility] with ams.  He had fever  of 101.6 at [place] and only responsive to deep stimuli.  Cxr showed diffuse infiltrates. Inutbated from 3/28 until 4/4. VT arrest requiring amiodarone and chest compressions on 06/27/2018, cath nonobstructive disease.  Subjective: alert, upright in chair; requires cueing Assessment / Plan / Recommendation CHL IP CLINICAL IMPRESSIONS 07/01/2018 Clinical Impression Pt presents with a mild oropharyngeal dysphagia. Oral deficitc c/b delayed AP transit, lingual pumping, reduced bolus cohesion, and mild oral residuals. Pt unable to propel 13 mm barium tablet to hypopharynx and eventually expecorated from oral cavity. Pharyngeal deficits c/b one instance of mistimed epiglottic inversion with reduced laryngeal vestibule closure resulting in penetration of thin liquids before the swallow (on initial thin series). No aspiration evidenced, no further penetration exhibited. Recommend dysphagia 2 (fine chopped) and thin liquids with medicines as tolerated and safe swallow strategies including full supervision with all PO. SLP to follow up.  SLP Visit Diagnosis Dysphagia, oropharyngeal phase (R13.12) Attention and concentration deficit following -- Frontal lobe and executive function deficit following -- Impact on safety and function Mild aspiration risk   CHL IP TREATMENT RECOMMENDATION 07/01/2018 Treatment Recommendations Therapy as outlined in treatment plan below   Prognosis 07/01/2018 Prognosis for Safe Diet Advancement Good Barriers to Reach Goals Cognitive deficits Barriers/Prognosis Comment -- CHL IP DIET RECOMMENDATION 07/01/2018 SLP Diet Recommendations Dysphagia 2 (Fine chop) solids;Thin liquid Liquid Administration via Cup;Straw Medication Administration Whole meds with puree Compensations Minimize environmental distractions;Slow rate;Small sips/bites Postural Changes Seated upright at 90 degrees;Remain semi-upright after after feeds/meals (Comment)   CHL IP OTHER RECOMMENDATIONS 07/01/2018 Recommended Consults -- Oral Care  Recommendations Oral care BID Other Recommendations --   CHL IP FOLLOW UP RECOMMENDATIONS 07/01/2018 Follow up Recommendations 24 hour supervision/assistance;Skilled Nursing facility   [facility] IP FREQUENCY AND DURATION 07/01/2018 Speech Therapy Frequency (ACUTE ONLY) min 2x/week Treatment Duration 1 week      CHL IP ORAL PHASE 07/01/2018 Oral Phase Impaired Oral - Pudding Teaspoon -- Oral - Pudding Cup -- Oral - Honey Teaspoon -- Oral - Honey Cup -- Oral - Nectar Teaspoon -- Oral - Nectar Cup -- Oral - Nectar Straw -- Oral - Thin Teaspoon -- Oral - Thin Cup -- Oral - Thin Straw -- Oral - Puree -- Oral - Mech Soft -- Oral - Regular -- Oral - Multi-Consistency -- Oral -  Pill -- Oral Phase - Comment --  CHL IP PHARYNGEAL PHASE 07/01/2018 Pharyngeal Phase Impaired Pharyngeal- Pudding Teaspoon -- Pharyngeal -- Pharyngeal- Pudding Cup -- Pharyngeal -- Pharyngeal- Honey Teaspoon -- Pharyngeal -- Pharyngeal- Honey Cup -- Pharyngeal -- Pharyngeal- Nectar Teaspoon -- Pharyngeal -- Pharyngeal- Nectar Cup Delayed swallow initiation-pyriform sinuses Pharyngeal -- Pharyngeal- Nectar Straw -- Pharyngeal -- Pharyngeal- Thin Teaspoon -- Pharyngeal -- Pharyngeal- Thin Cup Reduced epiglottic inversion;Reduced airway/laryngeal closure;Penetration/Aspiration before swallow Pharyngeal Material enters airway, remains ABOVE vocal cords and not ejected out Pharyngeal- Thin Straw Delayed swallow initiation-pyriform sinuses Pharyngeal Material does not enter airway Pharyngeal- Puree Reduced tongue base retraction;Pharyngeal residue - valleculae Pharyngeal Material does not enter airway Pharyngeal- Mechanical Soft -- Pharyngeal -- Pharyngeal- Regular Delayed swallow initiation-vallecula;Pharyngeal residue - valleculae;Reduced tongue base retraction Pharyngeal Material does not enter airway Pharyngeal- Multi-consistency -- Pharyngeal -- Pharyngeal- Pill Other (Comment) Pharyngeal -- Pharyngeal Comment --  CHL IP CERVICAL ESOPHAGEAL PHASE 07/01/2018 Cervical  Esophageal Phase WFL Pudding Teaspoon -- Pudding Cup -- Honey Teaspoon -- Honey Cup -- Nectar Teaspoon -- Nectar Cup -- Nectar Straw -- Thin Teaspoon -- Thin Cup -- Thin Straw -- Puree -- Mechanical Soft -- Regular -- Multi-consistency -- Pill -- Cervical Esophageal Comment -- Chelsea E Hartness MA, CCC-SLP 07/01/2018, 1:45 PM               Subjective: Eager to go home  Discharge Exam: Vitals:   07/09/18 1200 07/09/18 1406  BP: (!) 100/48 (!) 107/51  Pulse:    Resp:    Temp:  97.7 F (36.5 C)  SpO2:  98%   Vitals:   07/09/18 0453 07/09/18 0948 07/09/18 1200 07/09/18 1406  BP: (!) 95/53 (!) 109/58 (!) 100/48 (!) 107/51  Pulse: 67 88    Resp: 17     Temp:    97.7 F (36.5 C)  TempSrc:    Oral  SpO2: 98%   98%  Weight: 63.4 kg     Height:        General: Pt is alert, awake, not in acute distress Cardiovascular: RRR, S1/S2 +, no rubs, no gallops Respiratory: CTA bilaterally, no wheezing, no rhonchi Abdominal: Soft, NT, ND, bowel sounds + Extremities: no edema, no cyanosis   The results of significant diagnostics from this hospitalization (including imaging, microbiology, ancillary and laboratory) are listed below for reference.     Microbiology: Recent Results (from the past 240 hour(s))  MRSA PCR Screening     Status: None   Collection Time: 07/04/18  3:29 AM  Result Value Ref Range Status   MRSA by PCR NEGATIVE NEGATIVE Final    Comment:        The GeneXpert MRSA Assay (FDA approved for NASAL specimens only), is one component of a comprehensive MRSA colonization surveillance program. It is not intended to diagnose MRSA infection nor to guide or monitor treatment for MRSA infections. Performed at [facility], [place] [address] [place], [place] [address]      Labs: BNP (last 3 results) Recent Labs    02/06/18 0955  BNP 191.4*   Basic Metabolic Panel: Recent Labs  Lab 07/03/18 0649  07/04/18 2251 07/06/18 0409 07/07/18 0855 07/07/18 1117  07/08/18 0515 07/09/18 0545  NA 142   < >  --  138 135 135 137 136  K 3.3*   < > 3.7 3.8 3.8 3.9 4.4 4.3  CL 104   < >  --  97* 94* 98 93* 91*  CO2 21*   < >  --  $'25 25 22 26 26  'b$ GLUCOSE 190*   < >  --  218* 274* 272* 217* 273*  BUN 38*   < >  --  21 43* 45* 25* 52*  CREATININE 6.30*   < >  --  3.93* 6.57* 6.42* 4.61* 6.89*  CALCIUM 7.6*   < >  --  8.4* 8.5* 8.4* 9.4 9.2  MG  --    < > 2.4 2.0 2.2  --  2.2 2.4  PHOS 4.7*  --   --   --   --  4.0  --   --    < > = values in this interval not displayed.   Liver Function Tests: Recent Labs  Lab 07/03/18 0649 07/07/18 1117  ALBUMIN 2.4* 2.8*   No results for input(s): LIPASE, AMYLASE in the last 168 hours. No results for input(s): AMMONIA in the last 168 hours. CBC: Recent Labs  Lab 07/03/18 0649 07/04/18 0204 07/07/18 1117  WBC 4.7 8.4 8.3  HGB 9.2* 11.4* 11.3*  HCT 29.7* 35.7* 35.6*  MCV 94.0 94.7 93.9  PLT 221 332 345   Cardiac Enzymes: No results for input(s): CKTOTAL, CKMB, CKMBINDEX, TROPONINI in the last 168 hours. BNP: Invalid input(s): POCBNP CBG: Recent Labs  Lab 07/08/18 1221 07/08/18 1730 07/08/18 2124 07/09/18 0746 07/09/18 1139  GLUCAP 245* 255* 328* 257* 395*   D-Dimer No results for input(s): DDIMER in the last 72 hours. Hgb A1c No results for input(s): HGBA1C in the last 72 hours. Lipid Profile No results for input(s): CHOL, HDL, LDLCALC, TRIG, CHOLHDL, LDLDIRECT in the last 72 hours. Thyroid function studies No results for input(s): TSH, T4TOTAL, T3FREE, THYROIDAB in the last 72 hours.  Invalid input(s): FREET3 Anemia work up No results for input(s): VITAMINB12, FOLATE, FERRITIN, TIBC, IRON, RETICCTPCT in the last 72 hours. Urinalysis    Component Value Date/Time   COLORURINE STRAW (A) 03/10/2016 1905   APPEARANCEUR CLEAR 03/10/2016 1905   LABSPEC 1.011 03/10/2016 1905   PHURINE 6.0 03/10/2016 1905   GLUCOSEU >=500 (A) 03/10/2016 1905   HGBUR SMALL (A) 03/10/2016 1905   BILIRUBINUR  NEGATIVE 03/10/2016 1905   KETONESUR NEGATIVE 03/10/2016 1905   PROTEINUR >=300 (A) 03/10/2016 1905   NITRITE NEGATIVE 03/10/2016 1905   LEUKOCYTESUR NEGATIVE 03/10/2016 1905   Sepsis Labs Invalid input(s): PROCALCITONIN,  WBC,  LACTICIDVEN Microbiology Recent Results (from the past 240 hour(s))  MRSA PCR Screening     Status: None   Collection Time: 07/04/18  3:29 AM  Result Value Ref Range Status   MRSA by PCR NEGATIVE NEGATIVE Final    Comment:        The GeneXpert MRSA Assay (FDA approved for NASAL specimens only), is one component of a comprehensive MRSA colonization surveillance program. It is not intended to diagnose MRSA infection nor to guide or monitor treatment for MRSA infections. Performed at [facility], [place] [address], [place], [place] [address]    Time spent: 30 min  SIGNED:   Marylu Lund, MD  [facility] 07/09/2018, 4:05 PM  If 7PM-7AM, please contact night-coverage

## 2018-07-09 NOTE — Progress Notes (Signed)
  Speech Language Pathology Treatment: Dysphagia  Patient Details Name: Alexander Whitaker MRN: [id number] DOB: [birth year] Today's Date: 07/09/2018 Time: 1540-1600 SLP Time Calculation (min) (ACUTE ONLY): 20 min  Assessment / Plan / Recommendation Clinical Impression  Patient seen to address dysphagia goals with upgraded solids (Dys 3) and thin liquids. Patient self-fed and did not exhibit any overt s/s of aspiration or penetration with any of the tested boluses. Patient did exhibit prolonged mastication of dys 3 solids, however he is edentulous and his dentures are at home. Patient is to be discharging home with wife today, as she declined CIR or SNF. SLP is not recommending any follow-up SLP services and is recommending patient upgrade to dys 3 (mech soft) solids and continue with thin liquids at home.    HPI HPI: 68 year old male with history of esrd on hd, cva, htn, chf, presented to [facility] with ams.  He had fever of 101.6 at [facility] and only responsive to deep stimuli.  Cxr showed diffuse infiltrates. Inutbated from 3/28 until 4/4. VT arrest requiring amiodarone and chest compressions on 06/27/2018, cath nonobstructive disease.      SLP Plan  Discharge SLP treatment due to (comment);Other (Comment)(patient discharing home today (4/13))       Recommendations  Diet recommendations: Dysphagia 3 (mechanical soft) Liquids provided via: Cup;Straw Medication Administration: Whole meds with puree Supervision: Intermittent supervision to cue for compensatory strategies;Patient able to self feed Compensations: Minimize environmental distractions;Slow rate;Small sips/bites Postural Changes and/or Swallow Maneuvers: Seated upright 90 degrees;Upright 30-60 min after meal                Oral Care Recommendations: Oral care BID Follow up Recommendations: 24 hour supervision/assistance;None SLP Visit Diagnosis: Dysphagia, oropharyngeal phase (R13.12) Plan: Discharge SLP treatment due to  (comment);Other (Comment)(patient discharing home today (4/13))       GO                Alexander Whitaker 07/09/2018, 4:14 PM   Sonia Baller, MA, [place] [facility] Acute Rehab Pager: [phone] .

## 2018-07-09 NOTE — Progress Notes (Signed)
Progress Note  Patient Name: Alexander Whitaker Date of Encounter: 07/09/2018  Primary Cardiologist: Glenetta Hew, MD   Subjective   No chest pain or shortness of breath.  Eager to go home.  Inpatient Medications    Scheduled Meds: . amLODipine  10 mg Oral Daily  . aspirin  81 mg Oral Daily  . chlorhexidine  15 mL Mouth Rinse BID  . Chlorhexidine Gluconate Cloth  6 each Topical Q0600  . [START ON 07/10/2018] darbepoetin (ARANESP) injection - DIALYSIS  60 mcg Intravenous Q Tue-HD  . feeding supplement (NEPRO CARB STEADY)  237 mL Oral BID BM  . feeding supplement (PRO-STAT SUGAR FREE 64)  30 mL Oral BID  . heparin  5,000 Units Subcutaneous Q8H  . insulin aspart  0-5 Units Subcutaneous QHS  . insulin aspart  0-9 Units Subcutaneous TID WC  . insulin detemir  10 Units Subcutaneous QHS  . levothyroxine  50 mcg Per Tube Q0600  . lisinopril  5 mg Oral Daily  . mouth rinse  15 mL Mouth Rinse BID  . metoprolol tartrate  100 mg Oral BID  . multivitamin  1 tablet Oral QHS  . nicotine  14 mg Transdermal Daily  . pantoprazole (PROTONIX) IV  40 mg Intravenous Daily   Continuous Infusions: . ferric gluconate (FERRLECIT/NULECIT) IV 125 mg (07/07/18 1400)  . sodium chloride 250 mL (06/28/18 1511)   PRN Meds: acetaminophen, bismuth subsalicylate, hydrALAZINE, labetalol, sodium chloride   Vital Signs    Vitals:   07/08/18 0835 07/08/18 1728 07/08/18 2100 07/09/18 0453  BP:  (!) 103/55 (!) 118/53 (!) 95/53  Pulse: 73 72 71 67  Resp:  $Remo'18 18 17  'UJsmX$ Temp:  98 F (36.7 C) 98.3 F (36.8 C)   TempSrc:  Oral Axillary   SpO2:  99% 97% 98%  Weight:    63.4 kg  Height:        Intake/Output Summary (Last 24 hours) at 07/09/2018 0857 Last data filed at 07/08/2018 2000 Gross per 24 hour  Intake 838 ml  Output -  Net 838 ml   Last 3 Weights 07/09/2018 07/07/2018 07/07/2018  Weight (lbs) 139 lb 12.4 oz 138 lb 7.2 oz 143 lb 4.8 oz  Weight (kg) 63.4 kg 62.8 kg 65 kg      Telemetry    Normal  sinus rhythm - Personally Reviewed   Physical Exam  Alert, oriented, elderly appearing male in no distress.  He is sitting in a chair at the bedside. GEN: No acute distress.   Neck: No JVD Cardiac: RRR, no murmurs, rubs, or gallops.  Respiratory: Clear to auscultation bilaterally. GI: Soft, nontender, non-distended  MS: No edema; No deformity. Neuro:  Nonfocal  Psych: Normal affect   Labs    Chemistry Recent Labs  Lab 07/03/18 0649  07/07/18 1117 07/08/18 0515 07/09/18 0545  NA 142   < > 135 137 136  K 3.3*   < > 3.9 4.4 4.3  CL 104   < > 98 93* 91*  CO2 21*   < > $R'22 26 26  'QM$ GLUCOSE 190*   < > 272* 217* 273*  BUN 38*   < > 45* 25* 52*  CREATININE 6.30*   < > 6.42* 4.61* 6.89*  CALCIUM 7.6*   < > 8.4* 9.4 9.2  ALBUMIN 2.4*  --  2.8*  --   --   GFRNONAA 8*   < > 8* 12* 7*  GFRAA 10*   < > 9* 14*  9*  ANIONGAP 17*   < > 15 18* 19*   < > = values in this interval not displayed.     Hematology Recent Labs  Lab 07/03/18 0649 07/04/18 0204 07/07/18 1117  WBC 4.7 8.4 8.3  RBC 3.16* 3.77* 3.79*  HGB 9.2* 11.4* 11.3*  HCT 29.7* 35.7* 35.6*  MCV 94.0 94.7 93.9  MCH 29.1 30.2 29.8  MCHC 31.0 31.9 31.7  RDW 14.4 14.4 14.5  PLT 221 332 345    Cardiac EnzymesNo results for input(s): TROPONINI in the last 168 hours. No results for input(s): TROPIPOC in the last 168 hours.   BNPNo results for input(s): BNP, PROBNP in the last 168 hours.   DDimer No results for input(s): DDIMER in the last 168 hours.   Radiology    No results found.  Cardiac Studies    Cath: 06/27/18   Prox RCA lesion is 30% stenosed.  Mid RCA-1 lesion is 20% stenosed.  Mid RCA-2 lesion is 30% stenosed.  Ost LM to Mid LM lesion is 5% stenosed.  Mid LAD lesion is 10% stenosed.  There is moderate left ventricular systolic dysfunction.  Moderate LV dysfunction with an EF estimated 40% with diffuse hypocontractility. LVEDP 13 mm.  Mild nonobstructive CAD with smooth 5% narrowing in a  long large left main coronary artery which trifurcated into a large LAD, ramus intermediate, and left circumflex vessel. There was mild 10% smooth narrowing in the distal LAD.   Large dominant RCA with 3020 and 30% proximal to mid and mid distal narrowings.  RECOMMENDATION: Medical therapy for nonischemic cardiomyopathy. Present the patient continues to be on epinephrine, Neo-Synephrine, amiodarone. Plan dialysis as scheduled tomorrow.   ECHO IMPRESSIONS04/04/2018 1. The left ventricle has normal systolic function, with an ejection fraction of 60-65%. The cavity size was normal. There is mildly increased left ventricular wall thickness. No evidence of left ventricular regional wall motion abnormalities. 2. The tricuspid valve was grossly normal. Tricuspid valve regurgitation is mild-moderate. 3. The aortic valve is tricuspid.  Patient Profile     68 y.o. male admitted with fever and sob, subsequently develoed PMVT in the setting of long QT in the setting of QT prolonging drugs.  Assessment & Plan    1.  Recurrent polymorphic ventricular tachycardia/torsade: Last occurance in the morning of 4/8 with 1 defibrillation with restoration of sinus rhythm.  He had received a dose of Compazine prior to the episode. No further reported episodes since that time. K+ and Mg+ stable.  2.  Prolonged QTc interval: Thought to be the cause of his ventricular arrhythmias.  May have been worsened by azithromycin and possible amiodarone, along with a single dose of Compazine. -- At this point it appears that his issues with ventricular arrhythmias are likely due to prolonged QTC. QTc remains prolonged, EKG yesterday QT 554. Recheck today. Must avoid all QT prolonging medications.   3.  NICM: Ejection fraction approximately 40% via cath with diffuse hypocontractility.  Mild nonobstructive coronary artery disease.  Marked T wave abnormalities across the precordium could be due to his prolonged QTC.  -- Echo 4/10 with normal EF. Low EF on admission likely 22 to his cardiac arrest.  4.  ESRD: Currently on hemodialysis Tuesday Thursday Saturday.  Plan per renal.   5. DM: SSI  6. HTN: stable with current therapy.  For questions or updates, please contact [place] Please consult [email or web address] for contact info under   Signed, Reino Bellis, NP  07/09/2018, 8:57 AM  Patient seen, examined. Available data reviewed. Agree with findings, assessment, and plan as outlined by Reino Bellis, NP.  Exam findings documented above reflect my personal exam of this patient.  I have reviewed his telemetry and this demonstrates no further arrhythmia.  His QT interval today is 520 ms and this is personally reviewed on his telemetry strips.  This is improved from yesterday.  I have also reviewed his medications and he has no longer receiving any QT-prolonging medicine.  Specifically, promethazine is removed from his PRN medicines.  He is no longer on amiodarone or azithromycin. He appears stable from a cardiac perspective. Will order a repeat EKG, but I would not anticipate any further cardiac testing.   [facility] will sign off.   Medication Recommendations:  Avoid QT-prolonging medications - none currently on his medication list Other recommendations (labs, testing, etc):  none Follow up as an outpatient:  Will arrange  Sherren Mocha, M.D. 07/09/2018 1:18 PM

## 2018-07-10 DIAGNOSIS — D631 Anemia in chronic kidney disease: Secondary | ICD-10-CM | POA: Diagnosis not present

## 2018-07-10 DIAGNOSIS — I5032 Chronic diastolic (congestive) heart failure: Secondary | ICD-10-CM | POA: Diagnosis not present

## 2018-07-10 DIAGNOSIS — I639 Cerebral infarction, unspecified: Secondary | ICD-10-CM | POA: Diagnosis not present

## 2018-07-10 DIAGNOSIS — Z992 Dependence on renal dialysis: Secondary | ICD-10-CM | POA: Diagnosis not present

## 2018-07-10 DIAGNOSIS — I69341 Monoplegia of lower limb following cerebral infarction affecting right dominant side: Secondary | ICD-10-CM | POA: Diagnosis not present

## 2018-07-10 DIAGNOSIS — N186 End stage renal disease: Secondary | ICD-10-CM | POA: Diagnosis not present

## 2018-07-10 DIAGNOSIS — I132 Hypertensive heart and chronic kidney disease with heart failure and with stage 5 chronic kidney disease, or end stage renal disease: Secondary | ICD-10-CM | POA: Diagnosis not present

## 2018-07-10 DIAGNOSIS — E1122 Type 2 diabetes mellitus with diabetic chronic kidney disease: Secondary | ICD-10-CM | POA: Diagnosis not present

## 2018-07-10 DIAGNOSIS — A419 Sepsis, unspecified organism: Secondary | ICD-10-CM | POA: Diagnosis not present

## 2018-07-10 DIAGNOSIS — J159 Unspecified bacterial pneumonia: Secondary | ICD-10-CM | POA: Diagnosis not present

## 2018-07-12 DIAGNOSIS — N186 End stage renal disease: Secondary | ICD-10-CM | POA: Diagnosis not present

## 2018-07-12 DIAGNOSIS — N2581 Secondary hyperparathyroidism of renal origin: Secondary | ICD-10-CM | POA: Diagnosis not present

## 2018-07-12 DIAGNOSIS — E8770 Fluid overload, unspecified: Secondary | ICD-10-CM | POA: Diagnosis not present

## 2018-07-14 DIAGNOSIS — N2581 Secondary hyperparathyroidism of renal origin: Secondary | ICD-10-CM | POA: Diagnosis not present

## 2018-07-14 DIAGNOSIS — N186 End stage renal disease: Secondary | ICD-10-CM | POA: Diagnosis not present

## 2018-07-14 DIAGNOSIS — E8770 Fluid overload, unspecified: Secondary | ICD-10-CM | POA: Diagnosis not present

## 2018-07-16 DIAGNOSIS — J159 Unspecified bacterial pneumonia: Secondary | ICD-10-CM | POA: Diagnosis not present

## 2018-07-16 DIAGNOSIS — N186 End stage renal disease: Secondary | ICD-10-CM | POA: Diagnosis not present

## 2018-07-16 DIAGNOSIS — I132 Hypertensive heart and chronic kidney disease with heart failure and with stage 5 chronic kidney disease, or end stage renal disease: Secondary | ICD-10-CM | POA: Diagnosis not present

## 2018-07-16 DIAGNOSIS — I69341 Monoplegia of lower limb following cerebral infarction affecting right dominant side: Secondary | ICD-10-CM | POA: Diagnosis not present

## 2018-07-16 DIAGNOSIS — I214 Non-ST elevation (NSTEMI) myocardial infarction: Secondary | ICD-10-CM | POA: Diagnosis not present

## 2018-07-16 DIAGNOSIS — E1122 Type 2 diabetes mellitus with diabetic chronic kidney disease: Secondary | ICD-10-CM | POA: Diagnosis not present

## 2018-07-16 DIAGNOSIS — A419 Sepsis, unspecified organism: Secondary | ICD-10-CM | POA: Diagnosis not present

## 2018-07-16 DIAGNOSIS — D631 Anemia in chronic kidney disease: Secondary | ICD-10-CM | POA: Diagnosis not present

## 2018-07-16 DIAGNOSIS — I4891 Unspecified atrial fibrillation: Secondary | ICD-10-CM | POA: Diagnosis not present

## 2018-07-16 DIAGNOSIS — Z992 Dependence on renal dialysis: Secondary | ICD-10-CM | POA: Diagnosis not present

## 2018-07-16 DIAGNOSIS — I5032 Chronic diastolic (congestive) heart failure: Secondary | ICD-10-CM | POA: Diagnosis not present

## 2018-07-18 DIAGNOSIS — E8770 Fluid overload, unspecified: Secondary | ICD-10-CM | POA: Diagnosis not present

## 2018-07-18 DIAGNOSIS — N2581 Secondary hyperparathyroidism of renal origin: Secondary | ICD-10-CM | POA: Diagnosis not present

## 2018-07-18 DIAGNOSIS — N186 End stage renal disease: Secondary | ICD-10-CM | POA: Diagnosis not present

## 2018-07-19 DIAGNOSIS — E8770 Fluid overload, unspecified: Secondary | ICD-10-CM | POA: Diagnosis not present

## 2018-07-19 DIAGNOSIS — N186 End stage renal disease: Secondary | ICD-10-CM | POA: Diagnosis not present

## 2018-07-19 DIAGNOSIS — N2581 Secondary hyperparathyroidism of renal origin: Secondary | ICD-10-CM | POA: Diagnosis not present

## 2018-07-20 DIAGNOSIS — J159 Unspecified bacterial pneumonia: Secondary | ICD-10-CM | POA: Diagnosis not present

## 2018-07-20 DIAGNOSIS — N186 End stage renal disease: Secondary | ICD-10-CM | POA: Diagnosis not present

## 2018-07-20 DIAGNOSIS — Z992 Dependence on renal dialysis: Secondary | ICD-10-CM | POA: Diagnosis not present

## 2018-07-20 DIAGNOSIS — I132 Hypertensive heart and chronic kidney disease with heart failure and with stage 5 chronic kidney disease, or end stage renal disease: Secondary | ICD-10-CM | POA: Diagnosis not present

## 2018-07-20 DIAGNOSIS — I5032 Chronic diastolic (congestive) heart failure: Secondary | ICD-10-CM | POA: Diagnosis not present

## 2018-07-20 DIAGNOSIS — I69341 Monoplegia of lower limb following cerebral infarction affecting right dominant side: Secondary | ICD-10-CM | POA: Diagnosis not present

## 2018-07-20 DIAGNOSIS — D631 Anemia in chronic kidney disease: Secondary | ICD-10-CM | POA: Diagnosis not present

## 2018-07-20 DIAGNOSIS — E1122 Type 2 diabetes mellitus with diabetic chronic kidney disease: Secondary | ICD-10-CM | POA: Diagnosis not present

## 2018-07-20 DIAGNOSIS — A419 Sepsis, unspecified organism: Secondary | ICD-10-CM | POA: Diagnosis not present

## 2018-07-21 DIAGNOSIS — N2581 Secondary hyperparathyroidism of renal origin: Secondary | ICD-10-CM | POA: Diagnosis not present

## 2018-07-21 DIAGNOSIS — E8770 Fluid overload, unspecified: Secondary | ICD-10-CM | POA: Diagnosis not present

## 2018-07-21 DIAGNOSIS — N186 End stage renal disease: Secondary | ICD-10-CM | POA: Diagnosis not present

## 2018-07-24 DIAGNOSIS — N2581 Secondary hyperparathyroidism of renal origin: Secondary | ICD-10-CM | POA: Diagnosis not present

## 2018-07-24 DIAGNOSIS — E8770 Fluid overload, unspecified: Secondary | ICD-10-CM | POA: Diagnosis not present

## 2018-07-24 DIAGNOSIS — N186 End stage renal disease: Secondary | ICD-10-CM | POA: Diagnosis not present

## 2018-07-25 DIAGNOSIS — N186 End stage renal disease: Secondary | ICD-10-CM | POA: Diagnosis not present

## 2018-07-25 DIAGNOSIS — E8770 Fluid overload, unspecified: Secondary | ICD-10-CM | POA: Diagnosis not present

## 2018-07-25 DIAGNOSIS — N2581 Secondary hyperparathyroidism of renal origin: Secondary | ICD-10-CM | POA: Diagnosis not present

## 2018-07-26 DIAGNOSIS — E8770 Fluid overload, unspecified: Secondary | ICD-10-CM | POA: Diagnosis not present

## 2018-07-26 DIAGNOSIS — N2581 Secondary hyperparathyroidism of renal origin: Secondary | ICD-10-CM | POA: Diagnosis not present

## 2018-07-26 DIAGNOSIS — N186 End stage renal disease: Secondary | ICD-10-CM | POA: Diagnosis not present

## 2018-07-26 DIAGNOSIS — Z992 Dependence on renal dialysis: Secondary | ICD-10-CM | POA: Diagnosis not present

## 2018-07-26 DIAGNOSIS — E1129 Type 2 diabetes mellitus with other diabetic kidney complication: Secondary | ICD-10-CM | POA: Diagnosis not present

## 2018-07-28 DIAGNOSIS — N186 End stage renal disease: Secondary | ICD-10-CM | POA: Diagnosis not present

## 2018-07-28 DIAGNOSIS — N2581 Secondary hyperparathyroidism of renal origin: Secondary | ICD-10-CM | POA: Diagnosis not present

## 2018-07-28 DIAGNOSIS — E8770 Fluid overload, unspecified: Secondary | ICD-10-CM | POA: Diagnosis not present

## 2018-07-30 DIAGNOSIS — E1122 Type 2 diabetes mellitus with diabetic chronic kidney disease: Secondary | ICD-10-CM | POA: Diagnosis not present

## 2018-07-30 DIAGNOSIS — I132 Hypertensive heart and chronic kidney disease with heart failure and with stage 5 chronic kidney disease, or end stage renal disease: Secondary | ICD-10-CM | POA: Diagnosis not present

## 2018-07-30 DIAGNOSIS — I69341 Monoplegia of lower limb following cerebral infarction affecting right dominant side: Secondary | ICD-10-CM | POA: Diagnosis not present

## 2018-07-30 DIAGNOSIS — I5032 Chronic diastolic (congestive) heart failure: Secondary | ICD-10-CM | POA: Diagnosis not present

## 2018-07-30 DIAGNOSIS — A419 Sepsis, unspecified organism: Secondary | ICD-10-CM | POA: Diagnosis not present

## 2018-07-30 DIAGNOSIS — D631 Anemia in chronic kidney disease: Secondary | ICD-10-CM | POA: Diagnosis not present

## 2018-07-30 DIAGNOSIS — N186 End stage renal disease: Secondary | ICD-10-CM | POA: Diagnosis not present

## 2018-07-30 DIAGNOSIS — Z992 Dependence on renal dialysis: Secondary | ICD-10-CM | POA: Diagnosis not present

## 2018-07-30 DIAGNOSIS — J159 Unspecified bacterial pneumonia: Secondary | ICD-10-CM | POA: Diagnosis not present

## 2018-07-31 DIAGNOSIS — N186 End stage renal disease: Secondary | ICD-10-CM | POA: Diagnosis not present

## 2018-07-31 DIAGNOSIS — E8770 Fluid overload, unspecified: Secondary | ICD-10-CM | POA: Diagnosis not present

## 2018-07-31 DIAGNOSIS — N2581 Secondary hyperparathyroidism of renal origin: Secondary | ICD-10-CM | POA: Diagnosis not present

## 2018-08-01 DIAGNOSIS — N186 End stage renal disease: Secondary | ICD-10-CM | POA: Diagnosis not present

## 2018-08-01 DIAGNOSIS — I69341 Monoplegia of lower limb following cerebral infarction affecting right dominant side: Secondary | ICD-10-CM | POA: Diagnosis not present

## 2018-08-01 DIAGNOSIS — A419 Sepsis, unspecified organism: Secondary | ICD-10-CM | POA: Diagnosis not present

## 2018-08-01 DIAGNOSIS — E1122 Type 2 diabetes mellitus with diabetic chronic kidney disease: Secondary | ICD-10-CM | POA: Diagnosis not present

## 2018-08-01 DIAGNOSIS — I5032 Chronic diastolic (congestive) heart failure: Secondary | ICD-10-CM | POA: Diagnosis not present

## 2018-08-01 DIAGNOSIS — I132 Hypertensive heart and chronic kidney disease with heart failure and with stage 5 chronic kidney disease, or end stage renal disease: Secondary | ICD-10-CM | POA: Diagnosis not present

## 2018-08-01 DIAGNOSIS — J159 Unspecified bacterial pneumonia: Secondary | ICD-10-CM | POA: Diagnosis not present

## 2018-08-01 DIAGNOSIS — Z992 Dependence on renal dialysis: Secondary | ICD-10-CM | POA: Diagnosis not present

## 2018-08-01 DIAGNOSIS — D631 Anemia in chronic kidney disease: Secondary | ICD-10-CM | POA: Diagnosis not present

## 2018-08-02 DIAGNOSIS — N186 End stage renal disease: Secondary | ICD-10-CM | POA: Diagnosis not present

## 2018-08-02 DIAGNOSIS — E8770 Fluid overload, unspecified: Secondary | ICD-10-CM | POA: Diagnosis not present

## 2018-08-02 DIAGNOSIS — N2581 Secondary hyperparathyroidism of renal origin: Secondary | ICD-10-CM | POA: Diagnosis not present

## 2018-08-03 DIAGNOSIS — N2581 Secondary hyperparathyroidism of renal origin: Secondary | ICD-10-CM | POA: Diagnosis not present

## 2018-08-03 DIAGNOSIS — E8770 Fluid overload, unspecified: Secondary | ICD-10-CM | POA: Diagnosis not present

## 2018-08-03 DIAGNOSIS — N186 End stage renal disease: Secondary | ICD-10-CM | POA: Diagnosis not present

## 2018-08-04 DIAGNOSIS — N186 End stage renal disease: Secondary | ICD-10-CM | POA: Diagnosis not present

## 2018-08-04 DIAGNOSIS — E8770 Fluid overload, unspecified: Secondary | ICD-10-CM | POA: Diagnosis not present

## 2018-08-04 DIAGNOSIS — N2581 Secondary hyperparathyroidism of renal origin: Secondary | ICD-10-CM | POA: Diagnosis not present

## 2018-08-06 DIAGNOSIS — I5032 Chronic diastolic (congestive) heart failure: Secondary | ICD-10-CM | POA: Diagnosis not present

## 2018-08-06 DIAGNOSIS — D631 Anemia in chronic kidney disease: Secondary | ICD-10-CM | POA: Diagnosis not present

## 2018-08-06 DIAGNOSIS — I132 Hypertensive heart and chronic kidney disease with heart failure and with stage 5 chronic kidney disease, or end stage renal disease: Secondary | ICD-10-CM | POA: Diagnosis not present

## 2018-08-06 DIAGNOSIS — E1122 Type 2 diabetes mellitus with diabetic chronic kidney disease: Secondary | ICD-10-CM | POA: Diagnosis not present

## 2018-08-06 DIAGNOSIS — Z992 Dependence on renal dialysis: Secondary | ICD-10-CM | POA: Diagnosis not present

## 2018-08-06 DIAGNOSIS — N186 End stage renal disease: Secondary | ICD-10-CM | POA: Diagnosis not present

## 2018-08-06 DIAGNOSIS — J159 Unspecified bacterial pneumonia: Secondary | ICD-10-CM | POA: Diagnosis not present

## 2018-08-06 DIAGNOSIS — I69341 Monoplegia of lower limb following cerebral infarction affecting right dominant side: Secondary | ICD-10-CM | POA: Diagnosis not present

## 2018-08-06 DIAGNOSIS — A419 Sepsis, unspecified organism: Secondary | ICD-10-CM | POA: Diagnosis not present

## 2018-08-07 DIAGNOSIS — N186 End stage renal disease: Secondary | ICD-10-CM | POA: Diagnosis not present

## 2018-08-07 DIAGNOSIS — E8770 Fluid overload, unspecified: Secondary | ICD-10-CM | POA: Diagnosis not present

## 2018-08-07 DIAGNOSIS — N2581 Secondary hyperparathyroidism of renal origin: Secondary | ICD-10-CM | POA: Diagnosis not present

## 2018-08-08 DIAGNOSIS — E1122 Type 2 diabetes mellitus with diabetic chronic kidney disease: Secondary | ICD-10-CM | POA: Diagnosis not present

## 2018-08-08 DIAGNOSIS — I132 Hypertensive heart and chronic kidney disease with heart failure and with stage 5 chronic kidney disease, or end stage renal disease: Secondary | ICD-10-CM | POA: Diagnosis not present

## 2018-08-08 DIAGNOSIS — I5032 Chronic diastolic (congestive) heart failure: Secondary | ICD-10-CM | POA: Diagnosis not present

## 2018-08-08 DIAGNOSIS — J159 Unspecified bacterial pneumonia: Secondary | ICD-10-CM | POA: Diagnosis not present

## 2018-08-08 DIAGNOSIS — N186 End stage renal disease: Secondary | ICD-10-CM | POA: Diagnosis not present

## 2018-08-08 DIAGNOSIS — I69341 Monoplegia of lower limb following cerebral infarction affecting right dominant side: Secondary | ICD-10-CM | POA: Diagnosis not present

## 2018-08-08 DIAGNOSIS — D631 Anemia in chronic kidney disease: Secondary | ICD-10-CM | POA: Diagnosis not present

## 2018-08-08 DIAGNOSIS — Z992 Dependence on renal dialysis: Secondary | ICD-10-CM | POA: Diagnosis not present

## 2018-08-08 DIAGNOSIS — A419 Sepsis, unspecified organism: Secondary | ICD-10-CM | POA: Diagnosis not present

## 2018-08-09 DIAGNOSIS — E8770 Fluid overload, unspecified: Secondary | ICD-10-CM | POA: Diagnosis not present

## 2018-08-09 DIAGNOSIS — N186 End stage renal disease: Secondary | ICD-10-CM | POA: Diagnosis not present

## 2018-08-09 DIAGNOSIS — I639 Cerebral infarction, unspecified: Secondary | ICD-10-CM | POA: Diagnosis not present

## 2018-08-09 DIAGNOSIS — N2581 Secondary hyperparathyroidism of renal origin: Secondary | ICD-10-CM | POA: Diagnosis not present

## 2018-08-11 DIAGNOSIS — N186 End stage renal disease: Secondary | ICD-10-CM | POA: Diagnosis not present

## 2018-08-11 DIAGNOSIS — N2581 Secondary hyperparathyroidism of renal origin: Secondary | ICD-10-CM | POA: Diagnosis not present

## 2018-08-11 DIAGNOSIS — E8770 Fluid overload, unspecified: Secondary | ICD-10-CM | POA: Diagnosis not present

## 2018-08-14 DIAGNOSIS — N2581 Secondary hyperparathyroidism of renal origin: Secondary | ICD-10-CM | POA: Diagnosis not present

## 2018-08-14 DIAGNOSIS — E8770 Fluid overload, unspecified: Secondary | ICD-10-CM | POA: Diagnosis not present

## 2018-08-14 DIAGNOSIS — N186 End stage renal disease: Secondary | ICD-10-CM | POA: Diagnosis not present

## 2018-08-16 DIAGNOSIS — N2581 Secondary hyperparathyroidism of renal origin: Secondary | ICD-10-CM | POA: Diagnosis not present

## 2018-08-16 DIAGNOSIS — E8770 Fluid overload, unspecified: Secondary | ICD-10-CM | POA: Diagnosis not present

## 2018-08-16 DIAGNOSIS — N186 End stage renal disease: Secondary | ICD-10-CM | POA: Diagnosis not present

## 2018-08-17 DIAGNOSIS — E119 Type 2 diabetes mellitus without complications: Secondary | ICD-10-CM | POA: Diagnosis not present

## 2018-08-17 DIAGNOSIS — E113293 Type 2 diabetes mellitus with mild nonproliferative diabetic retinopathy without macular edema, bilateral: Secondary | ICD-10-CM | POA: Diagnosis not present

## 2018-08-17 DIAGNOSIS — Z01818 Encounter for other preprocedural examination: Secondary | ICD-10-CM | POA: Diagnosis not present

## 2018-08-17 DIAGNOSIS — H2512 Age-related nuclear cataract, left eye: Secondary | ICD-10-CM | POA: Diagnosis not present

## 2018-08-17 DIAGNOSIS — H25812 Combined forms of age-related cataract, left eye: Secondary | ICD-10-CM | POA: Diagnosis not present

## 2018-08-18 DIAGNOSIS — N2581 Secondary hyperparathyroidism of renal origin: Secondary | ICD-10-CM | POA: Diagnosis not present

## 2018-08-18 DIAGNOSIS — N186 End stage renal disease: Secondary | ICD-10-CM | POA: Diagnosis not present

## 2018-08-18 DIAGNOSIS — E8770 Fluid overload, unspecified: Secondary | ICD-10-CM | POA: Diagnosis not present

## 2018-08-21 DIAGNOSIS — N2581 Secondary hyperparathyroidism of renal origin: Secondary | ICD-10-CM | POA: Diagnosis not present

## 2018-08-21 DIAGNOSIS — E8770 Fluid overload, unspecified: Secondary | ICD-10-CM | POA: Diagnosis not present

## 2018-08-21 DIAGNOSIS — N186 End stage renal disease: Secondary | ICD-10-CM | POA: Diagnosis not present

## 2018-08-23 DIAGNOSIS — N2581 Secondary hyperparathyroidism of renal origin: Secondary | ICD-10-CM | POA: Diagnosis not present

## 2018-08-23 DIAGNOSIS — N186 End stage renal disease: Secondary | ICD-10-CM | POA: Diagnosis not present

## 2018-08-23 DIAGNOSIS — E8770 Fluid overload, unspecified: Secondary | ICD-10-CM | POA: Diagnosis not present

## 2018-08-25 DIAGNOSIS — N2581 Secondary hyperparathyroidism of renal origin: Secondary | ICD-10-CM | POA: Diagnosis not present

## 2018-08-25 DIAGNOSIS — N186 End stage renal disease: Secondary | ICD-10-CM | POA: Diagnosis not present

## 2018-08-25 DIAGNOSIS — E8770 Fluid overload, unspecified: Secondary | ICD-10-CM | POA: Diagnosis not present

## 2018-08-26 DIAGNOSIS — E1129 Type 2 diabetes mellitus with other diabetic kidney complication: Secondary | ICD-10-CM | POA: Diagnosis not present

## 2018-08-26 DIAGNOSIS — Z992 Dependence on renal dialysis: Secondary | ICD-10-CM | POA: Diagnosis not present

## 2018-08-26 DIAGNOSIS — N186 End stage renal disease: Secondary | ICD-10-CM | POA: Diagnosis not present

## 2018-08-28 DIAGNOSIS — N2581 Secondary hyperparathyroidism of renal origin: Secondary | ICD-10-CM | POA: Diagnosis not present

## 2018-08-28 DIAGNOSIS — E8779 Other fluid overload: Secondary | ICD-10-CM | POA: Diagnosis not present

## 2018-08-28 DIAGNOSIS — N186 End stage renal disease: Secondary | ICD-10-CM | POA: Diagnosis not present

## 2018-08-30 DIAGNOSIS — E8779 Other fluid overload: Secondary | ICD-10-CM | POA: Diagnosis not present

## 2018-08-30 DIAGNOSIS — N2581 Secondary hyperparathyroidism of renal origin: Secondary | ICD-10-CM | POA: Diagnosis not present

## 2018-08-30 DIAGNOSIS — N186 End stage renal disease: Secondary | ICD-10-CM | POA: Diagnosis not present

## 2018-08-31 DIAGNOSIS — E8779 Other fluid overload: Secondary | ICD-10-CM | POA: Diagnosis not present

## 2018-08-31 DIAGNOSIS — N186 End stage renal disease: Secondary | ICD-10-CM | POA: Diagnosis not present

## 2018-08-31 DIAGNOSIS — N2581 Secondary hyperparathyroidism of renal origin: Secondary | ICD-10-CM | POA: Diagnosis not present

## 2018-09-01 DIAGNOSIS — E8779 Other fluid overload: Secondary | ICD-10-CM | POA: Diagnosis not present

## 2018-09-01 DIAGNOSIS — N2581 Secondary hyperparathyroidism of renal origin: Secondary | ICD-10-CM | POA: Diagnosis not present

## 2018-09-01 DIAGNOSIS — N186 End stage renal disease: Secondary | ICD-10-CM | POA: Diagnosis not present

## 2018-09-03 DIAGNOSIS — N2581 Secondary hyperparathyroidism of renal origin: Secondary | ICD-10-CM | POA: Diagnosis not present

## 2018-09-03 DIAGNOSIS — N186 End stage renal disease: Secondary | ICD-10-CM | POA: Diagnosis not present

## 2018-09-03 DIAGNOSIS — E8779 Other fluid overload: Secondary | ICD-10-CM | POA: Diagnosis not present

## 2018-09-04 DIAGNOSIS — H25812 Combined forms of age-related cataract, left eye: Secondary | ICD-10-CM | POA: Diagnosis not present

## 2018-09-04 DIAGNOSIS — Z79899 Other long term (current) drug therapy: Secondary | ICD-10-CM | POA: Diagnosis not present

## 2018-09-04 DIAGNOSIS — Z794 Long term (current) use of insulin: Secondary | ICD-10-CM | POA: Diagnosis not present

## 2018-09-04 DIAGNOSIS — Z538 Procedure and treatment not carried out for other reasons: Secondary | ICD-10-CM | POA: Diagnosis not present

## 2018-09-04 DIAGNOSIS — I10 Essential (primary) hypertension: Secondary | ICD-10-CM | POA: Diagnosis not present

## 2018-09-04 DIAGNOSIS — Z8673 Personal history of transient ischemic attack (TIA), and cerebral infarction without residual deficits: Secondary | ICD-10-CM | POA: Diagnosis not present

## 2018-09-04 DIAGNOSIS — J449 Chronic obstructive pulmonary disease, unspecified: Secondary | ICD-10-CM | POA: Diagnosis not present

## 2018-09-04 DIAGNOSIS — E1136 Type 2 diabetes mellitus with diabetic cataract: Secondary | ICD-10-CM | POA: Diagnosis not present

## 2018-09-06 DIAGNOSIS — E8779 Other fluid overload: Secondary | ICD-10-CM | POA: Diagnosis not present

## 2018-09-06 DIAGNOSIS — N2581 Secondary hyperparathyroidism of renal origin: Secondary | ICD-10-CM | POA: Diagnosis not present

## 2018-09-06 DIAGNOSIS — N186 End stage renal disease: Secondary | ICD-10-CM | POA: Diagnosis not present

## 2018-09-07 DIAGNOSIS — R609 Edema, unspecified: Secondary | ICD-10-CM | POA: Diagnosis not present

## 2018-09-07 DIAGNOSIS — E1129 Type 2 diabetes mellitus with other diabetic kidney complication: Secondary | ICD-10-CM | POA: Diagnosis not present

## 2018-09-07 DIAGNOSIS — F411 Generalized anxiety disorder: Secondary | ICD-10-CM | POA: Diagnosis not present

## 2018-09-07 DIAGNOSIS — E1165 Type 2 diabetes mellitus with hyperglycemia: Secondary | ICD-10-CM | POA: Diagnosis not present

## 2018-09-07 DIAGNOSIS — M62838 Other muscle spasm: Secondary | ICD-10-CM | POA: Diagnosis not present

## 2018-09-08 DIAGNOSIS — N2581 Secondary hyperparathyroidism of renal origin: Secondary | ICD-10-CM | POA: Diagnosis not present

## 2018-09-08 DIAGNOSIS — N186 End stage renal disease: Secondary | ICD-10-CM | POA: Diagnosis not present

## 2018-09-08 DIAGNOSIS — E8779 Other fluid overload: Secondary | ICD-10-CM | POA: Diagnosis not present

## 2018-09-09 DIAGNOSIS — I639 Cerebral infarction, unspecified: Secondary | ICD-10-CM | POA: Diagnosis not present

## 2018-09-11 DIAGNOSIS — N2581 Secondary hyperparathyroidism of renal origin: Secondary | ICD-10-CM | POA: Diagnosis not present

## 2018-09-11 DIAGNOSIS — N186 End stage renal disease: Secondary | ICD-10-CM | POA: Diagnosis not present

## 2018-09-11 DIAGNOSIS — E8779 Other fluid overload: Secondary | ICD-10-CM | POA: Diagnosis not present

## 2018-09-12 DIAGNOSIS — N186 End stage renal disease: Secondary | ICD-10-CM | POA: Diagnosis not present

## 2018-09-12 DIAGNOSIS — N2581 Secondary hyperparathyroidism of renal origin: Secondary | ICD-10-CM | POA: Diagnosis not present

## 2018-09-12 DIAGNOSIS — E8779 Other fluid overload: Secondary | ICD-10-CM | POA: Diagnosis not present

## 2018-09-14 DIAGNOSIS — E8779 Other fluid overload: Secondary | ICD-10-CM | POA: Diagnosis not present

## 2018-09-14 DIAGNOSIS — N2581 Secondary hyperparathyroidism of renal origin: Secondary | ICD-10-CM | POA: Diagnosis not present

## 2018-09-14 DIAGNOSIS — N186 End stage renal disease: Secondary | ICD-10-CM | POA: Diagnosis not present

## 2018-09-18 DIAGNOSIS — N186 End stage renal disease: Secondary | ICD-10-CM | POA: Diagnosis not present

## 2018-09-18 DIAGNOSIS — N2581 Secondary hyperparathyroidism of renal origin: Secondary | ICD-10-CM | POA: Diagnosis not present

## 2018-09-18 DIAGNOSIS — E8779 Other fluid overload: Secondary | ICD-10-CM | POA: Diagnosis not present

## 2018-09-20 DIAGNOSIS — E8779 Other fluid overload: Secondary | ICD-10-CM | POA: Diagnosis not present

## 2018-09-20 DIAGNOSIS — N186 End stage renal disease: Secondary | ICD-10-CM | POA: Diagnosis not present

## 2018-09-20 DIAGNOSIS — N2581 Secondary hyperparathyroidism of renal origin: Secondary | ICD-10-CM | POA: Diagnosis not present

## 2018-09-22 DIAGNOSIS — E8779 Other fluid overload: Secondary | ICD-10-CM | POA: Diagnosis not present

## 2018-09-22 DIAGNOSIS — N2581 Secondary hyperparathyroidism of renal origin: Secondary | ICD-10-CM | POA: Diagnosis not present

## 2018-09-22 DIAGNOSIS — N186 End stage renal disease: Secondary | ICD-10-CM | POA: Diagnosis not present

## 2018-09-24 DIAGNOSIS — E8779 Other fluid overload: Secondary | ICD-10-CM | POA: Diagnosis not present

## 2018-09-24 DIAGNOSIS — N2581 Secondary hyperparathyroidism of renal origin: Secondary | ICD-10-CM | POA: Diagnosis not present

## 2018-09-24 DIAGNOSIS — N186 End stage renal disease: Secondary | ICD-10-CM | POA: Diagnosis not present

## 2018-09-25 DIAGNOSIS — Z8673 Personal history of transient ischemic attack (TIA), and cerebral infarction without residual deficits: Secondary | ICD-10-CM | POA: Diagnosis not present

## 2018-09-25 DIAGNOSIS — E1122 Type 2 diabetes mellitus with diabetic chronic kidney disease: Secondary | ICD-10-CM | POA: Diagnosis not present

## 2018-09-25 DIAGNOSIS — E1136 Type 2 diabetes mellitus with diabetic cataract: Secondary | ICD-10-CM | POA: Diagnosis not present

## 2018-09-25 DIAGNOSIS — E113313 Type 2 diabetes mellitus with moderate nonproliferative diabetic retinopathy with macular edema, bilateral: Secondary | ICD-10-CM | POA: Diagnosis not present

## 2018-09-25 DIAGNOSIS — H25812 Combined forms of age-related cataract, left eye: Secondary | ICD-10-CM | POA: Diagnosis not present

## 2018-09-25 DIAGNOSIS — H259 Unspecified age-related cataract: Secondary | ICD-10-CM | POA: Diagnosis not present

## 2018-09-25 DIAGNOSIS — Z992 Dependence on renal dialysis: Secondary | ICD-10-CM | POA: Diagnosis not present

## 2018-09-25 DIAGNOSIS — E1129 Type 2 diabetes mellitus with other diabetic kidney complication: Secondary | ICD-10-CM | POA: Diagnosis not present

## 2018-09-25 DIAGNOSIS — N186 End stage renal disease: Secondary | ICD-10-CM | POA: Diagnosis not present

## 2018-09-25 DIAGNOSIS — J449 Chronic obstructive pulmonary disease, unspecified: Secondary | ICD-10-CM | POA: Diagnosis not present

## 2018-09-27 DIAGNOSIS — N2581 Secondary hyperparathyroidism of renal origin: Secondary | ICD-10-CM | POA: Diagnosis not present

## 2018-09-27 DIAGNOSIS — N186 End stage renal disease: Secondary | ICD-10-CM | POA: Diagnosis not present

## 2018-09-29 DIAGNOSIS — N2581 Secondary hyperparathyroidism of renal origin: Secondary | ICD-10-CM | POA: Diagnosis not present

## 2018-09-29 DIAGNOSIS — N186 End stage renal disease: Secondary | ICD-10-CM | POA: Diagnosis not present

## 2018-10-02 DIAGNOSIS — N2581 Secondary hyperparathyroidism of renal origin: Secondary | ICD-10-CM | POA: Diagnosis not present

## 2018-10-02 DIAGNOSIS — N186 End stage renal disease: Secondary | ICD-10-CM | POA: Diagnosis not present

## 2018-10-04 DIAGNOSIS — N2581 Secondary hyperparathyroidism of renal origin: Secondary | ICD-10-CM | POA: Diagnosis not present

## 2018-10-04 DIAGNOSIS — N186 End stage renal disease: Secondary | ICD-10-CM | POA: Diagnosis not present

## 2018-10-05 DIAGNOSIS — B370 Candidal stomatitis: Secondary | ICD-10-CM | POA: Diagnosis not present

## 2018-10-05 DIAGNOSIS — I10 Essential (primary) hypertension: Secondary | ICD-10-CM | POA: Diagnosis not present

## 2018-10-05 DIAGNOSIS — E1140 Type 2 diabetes mellitus with diabetic neuropathy, unspecified: Secondary | ICD-10-CM | POA: Diagnosis not present

## 2018-10-05 DIAGNOSIS — E039 Hypothyroidism, unspecified: Secondary | ICD-10-CM | POA: Diagnosis not present

## 2018-10-05 DIAGNOSIS — R110 Nausea: Secondary | ICD-10-CM | POA: Diagnosis not present

## 2018-10-06 DIAGNOSIS — N186 End stage renal disease: Secondary | ICD-10-CM | POA: Diagnosis not present

## 2018-10-06 DIAGNOSIS — N2581 Secondary hyperparathyroidism of renal origin: Secondary | ICD-10-CM | POA: Diagnosis not present

## 2018-10-09 DIAGNOSIS — I639 Cerebral infarction, unspecified: Secondary | ICD-10-CM | POA: Diagnosis not present

## 2018-10-09 DIAGNOSIS — N186 End stage renal disease: Secondary | ICD-10-CM | POA: Diagnosis not present

## 2018-10-09 DIAGNOSIS — N2581 Secondary hyperparathyroidism of renal origin: Secondary | ICD-10-CM | POA: Diagnosis not present

## 2018-10-11 DIAGNOSIS — N2581 Secondary hyperparathyroidism of renal origin: Secondary | ICD-10-CM | POA: Diagnosis not present

## 2018-10-11 DIAGNOSIS — N186 End stage renal disease: Secondary | ICD-10-CM | POA: Diagnosis not present

## 2018-10-13 DIAGNOSIS — N2581 Secondary hyperparathyroidism of renal origin: Secondary | ICD-10-CM | POA: Diagnosis not present

## 2018-10-13 DIAGNOSIS — N186 End stage renal disease: Secondary | ICD-10-CM | POA: Diagnosis not present

## 2018-10-16 DIAGNOSIS — N2581 Secondary hyperparathyroidism of renal origin: Secondary | ICD-10-CM | POA: Diagnosis not present

## 2018-10-16 DIAGNOSIS — N186 End stage renal disease: Secondary | ICD-10-CM | POA: Diagnosis not present

## 2018-10-18 DIAGNOSIS — N186 End stage renal disease: Secondary | ICD-10-CM | POA: Diagnosis not present

## 2018-10-18 DIAGNOSIS — N2581 Secondary hyperparathyroidism of renal origin: Secondary | ICD-10-CM | POA: Diagnosis not present

## 2018-10-20 DIAGNOSIS — N186 End stage renal disease: Secondary | ICD-10-CM | POA: Diagnosis not present

## 2018-10-20 DIAGNOSIS — N2581 Secondary hyperparathyroidism of renal origin: Secondary | ICD-10-CM | POA: Diagnosis not present

## 2018-10-23 DIAGNOSIS — N2581 Secondary hyperparathyroidism of renal origin: Secondary | ICD-10-CM | POA: Diagnosis not present

## 2018-10-23 DIAGNOSIS — N186 End stage renal disease: Secondary | ICD-10-CM | POA: Diagnosis not present

## 2018-10-25 DIAGNOSIS — N186 End stage renal disease: Secondary | ICD-10-CM | POA: Diagnosis not present

## 2018-10-25 DIAGNOSIS — N2581 Secondary hyperparathyroidism of renal origin: Secondary | ICD-10-CM | POA: Diagnosis not present

## 2018-10-26 DIAGNOSIS — Z992 Dependence on renal dialysis: Secondary | ICD-10-CM | POA: Diagnosis not present

## 2018-10-26 DIAGNOSIS — E1129 Type 2 diabetes mellitus with other diabetic kidney complication: Secondary | ICD-10-CM | POA: Diagnosis not present

## 2018-10-26 DIAGNOSIS — N186 End stage renal disease: Secondary | ICD-10-CM | POA: Diagnosis not present

## 2018-10-27 DIAGNOSIS — E8779 Other fluid overload: Secondary | ICD-10-CM | POA: Diagnosis not present

## 2018-10-27 DIAGNOSIS — N186 End stage renal disease: Secondary | ICD-10-CM | POA: Diagnosis not present

## 2018-10-27 DIAGNOSIS — N2581 Secondary hyperparathyroidism of renal origin: Secondary | ICD-10-CM | POA: Diagnosis not present

## 2018-10-27 DIAGNOSIS — Z992 Dependence on renal dialysis: Secondary | ICD-10-CM | POA: Diagnosis not present

## 2018-10-30 DIAGNOSIS — E8779 Other fluid overload: Secondary | ICD-10-CM | POA: Diagnosis not present

## 2018-10-30 DIAGNOSIS — N2581 Secondary hyperparathyroidism of renal origin: Secondary | ICD-10-CM | POA: Diagnosis not present

## 2018-10-30 DIAGNOSIS — N186 End stage renal disease: Secondary | ICD-10-CM | POA: Diagnosis not present

## 2018-10-30 DIAGNOSIS — Z992 Dependence on renal dialysis: Secondary | ICD-10-CM | POA: Diagnosis not present

## 2018-10-31 DIAGNOSIS — N186 End stage renal disease: Secondary | ICD-10-CM | POA: Diagnosis not present

## 2018-10-31 DIAGNOSIS — Z992 Dependence on renal dialysis: Secondary | ICD-10-CM | POA: Diagnosis not present

## 2018-10-31 DIAGNOSIS — E8779 Other fluid overload: Secondary | ICD-10-CM | POA: Diagnosis not present

## 2018-10-31 DIAGNOSIS — N2581 Secondary hyperparathyroidism of renal origin: Secondary | ICD-10-CM | POA: Diagnosis not present

## 2018-11-01 DIAGNOSIS — E8779 Other fluid overload: Secondary | ICD-10-CM | POA: Diagnosis not present

## 2018-11-01 DIAGNOSIS — N186 End stage renal disease: Secondary | ICD-10-CM | POA: Diagnosis not present

## 2018-11-01 DIAGNOSIS — Z992 Dependence on renal dialysis: Secondary | ICD-10-CM | POA: Diagnosis not present

## 2018-11-01 DIAGNOSIS — N2581 Secondary hyperparathyroidism of renal origin: Secondary | ICD-10-CM | POA: Diagnosis not present

## 2018-11-03 DIAGNOSIS — N186 End stage renal disease: Secondary | ICD-10-CM | POA: Diagnosis not present

## 2018-11-03 DIAGNOSIS — N2581 Secondary hyperparathyroidism of renal origin: Secondary | ICD-10-CM | POA: Diagnosis not present

## 2018-11-03 DIAGNOSIS — Z992 Dependence on renal dialysis: Secondary | ICD-10-CM | POA: Diagnosis not present

## 2018-11-03 DIAGNOSIS — E8779 Other fluid overload: Secondary | ICD-10-CM | POA: Diagnosis not present

## 2018-11-06 DIAGNOSIS — Z992 Dependence on renal dialysis: Secondary | ICD-10-CM | POA: Diagnosis not present

## 2018-11-06 DIAGNOSIS — N186 End stage renal disease: Secondary | ICD-10-CM | POA: Diagnosis not present

## 2018-11-06 DIAGNOSIS — N2581 Secondary hyperparathyroidism of renal origin: Secondary | ICD-10-CM | POA: Diagnosis not present

## 2018-11-06 DIAGNOSIS — E8779 Other fluid overload: Secondary | ICD-10-CM | POA: Diagnosis not present

## 2018-11-07 DIAGNOSIS — Z992 Dependence on renal dialysis: Secondary | ICD-10-CM | POA: Diagnosis not present

## 2018-11-07 DIAGNOSIS — N186 End stage renal disease: Secondary | ICD-10-CM | POA: Diagnosis not present

## 2018-11-07 DIAGNOSIS — E8779 Other fluid overload: Secondary | ICD-10-CM | POA: Diagnosis not present

## 2018-11-07 DIAGNOSIS — N2581 Secondary hyperparathyroidism of renal origin: Secondary | ICD-10-CM | POA: Diagnosis not present

## 2018-11-10 DIAGNOSIS — N186 End stage renal disease: Secondary | ICD-10-CM | POA: Diagnosis not present

## 2018-11-10 DIAGNOSIS — E8779 Other fluid overload: Secondary | ICD-10-CM | POA: Diagnosis not present

## 2018-11-10 DIAGNOSIS — Z992 Dependence on renal dialysis: Secondary | ICD-10-CM | POA: Diagnosis not present

## 2018-11-10 DIAGNOSIS — N2581 Secondary hyperparathyroidism of renal origin: Secondary | ICD-10-CM | POA: Diagnosis not present

## 2018-11-13 DIAGNOSIS — N2581 Secondary hyperparathyroidism of renal origin: Secondary | ICD-10-CM | POA: Diagnosis not present

## 2018-11-13 DIAGNOSIS — Z992 Dependence on renal dialysis: Secondary | ICD-10-CM | POA: Diagnosis not present

## 2018-11-13 DIAGNOSIS — N186 End stage renal disease: Secondary | ICD-10-CM | POA: Diagnosis not present

## 2018-11-13 DIAGNOSIS — E8779 Other fluid overload: Secondary | ICD-10-CM | POA: Diagnosis not present

## 2018-11-15 DIAGNOSIS — N2581 Secondary hyperparathyroidism of renal origin: Secondary | ICD-10-CM | POA: Diagnosis not present

## 2018-11-15 DIAGNOSIS — E8779 Other fluid overload: Secondary | ICD-10-CM | POA: Diagnosis not present

## 2018-11-15 DIAGNOSIS — N186 End stage renal disease: Secondary | ICD-10-CM | POA: Diagnosis not present

## 2018-11-15 DIAGNOSIS — Z992 Dependence on renal dialysis: Secondary | ICD-10-CM | POA: Diagnosis not present

## 2018-11-17 DIAGNOSIS — N2581 Secondary hyperparathyroidism of renal origin: Secondary | ICD-10-CM | POA: Diagnosis not present

## 2018-11-17 DIAGNOSIS — Z992 Dependence on renal dialysis: Secondary | ICD-10-CM | POA: Diagnosis not present

## 2018-11-17 DIAGNOSIS — N186 End stage renal disease: Secondary | ICD-10-CM | POA: Diagnosis not present

## 2018-11-17 DIAGNOSIS — E8779 Other fluid overload: Secondary | ICD-10-CM | POA: Diagnosis not present

## 2018-11-20 DIAGNOSIS — E8779 Other fluid overload: Secondary | ICD-10-CM | POA: Diagnosis not present

## 2018-11-20 DIAGNOSIS — N186 End stage renal disease: Secondary | ICD-10-CM | POA: Diagnosis not present

## 2018-11-20 DIAGNOSIS — N2581 Secondary hyperparathyroidism of renal origin: Secondary | ICD-10-CM | POA: Diagnosis not present

## 2018-11-20 DIAGNOSIS — Z992 Dependence on renal dialysis: Secondary | ICD-10-CM | POA: Diagnosis not present

## 2018-11-22 DIAGNOSIS — Z992 Dependence on renal dialysis: Secondary | ICD-10-CM | POA: Diagnosis not present

## 2018-11-22 DIAGNOSIS — N2581 Secondary hyperparathyroidism of renal origin: Secondary | ICD-10-CM | POA: Diagnosis not present

## 2018-11-22 DIAGNOSIS — E8779 Other fluid overload: Secondary | ICD-10-CM | POA: Diagnosis not present

## 2018-11-22 DIAGNOSIS — N186 End stage renal disease: Secondary | ICD-10-CM | POA: Diagnosis not present

## 2018-11-23 DIAGNOSIS — E8779 Other fluid overload: Secondary | ICD-10-CM | POA: Diagnosis not present

## 2018-11-23 DIAGNOSIS — N186 End stage renal disease: Secondary | ICD-10-CM | POA: Diagnosis not present

## 2018-11-23 DIAGNOSIS — Z992 Dependence on renal dialysis: Secondary | ICD-10-CM | POA: Diagnosis not present

## 2018-11-23 DIAGNOSIS — N2581 Secondary hyperparathyroidism of renal origin: Secondary | ICD-10-CM | POA: Diagnosis not present

## 2018-11-24 DIAGNOSIS — N2581 Secondary hyperparathyroidism of renal origin: Secondary | ICD-10-CM | POA: Diagnosis not present

## 2018-11-24 DIAGNOSIS — N186 End stage renal disease: Secondary | ICD-10-CM | POA: Diagnosis not present

## 2018-11-24 DIAGNOSIS — Z992 Dependence on renal dialysis: Secondary | ICD-10-CM | POA: Diagnosis not present

## 2018-11-24 DIAGNOSIS — E8779 Other fluid overload: Secondary | ICD-10-CM | POA: Diagnosis not present

## 2018-11-26 DIAGNOSIS — Z992 Dependence on renal dialysis: Secondary | ICD-10-CM | POA: Diagnosis not present

## 2018-11-26 DIAGNOSIS — N186 End stage renal disease: Secondary | ICD-10-CM | POA: Diagnosis not present

## 2018-11-26 DIAGNOSIS — E1129 Type 2 diabetes mellitus with other diabetic kidney complication: Secondary | ICD-10-CM | POA: Diagnosis not present

## 2018-11-27 DIAGNOSIS — E8779 Other fluid overload: Secondary | ICD-10-CM | POA: Diagnosis not present

## 2018-11-27 DIAGNOSIS — Z992 Dependence on renal dialysis: Secondary | ICD-10-CM | POA: Diagnosis not present

## 2018-11-27 DIAGNOSIS — N2581 Secondary hyperparathyroidism of renal origin: Secondary | ICD-10-CM | POA: Diagnosis not present

## 2018-11-27 DIAGNOSIS — N186 End stage renal disease: Secondary | ICD-10-CM | POA: Diagnosis not present

## 2018-11-29 DIAGNOSIS — E8779 Other fluid overload: Secondary | ICD-10-CM | POA: Diagnosis not present

## 2018-11-29 DIAGNOSIS — N186 End stage renal disease: Secondary | ICD-10-CM | POA: Diagnosis not present

## 2018-11-29 DIAGNOSIS — N2581 Secondary hyperparathyroidism of renal origin: Secondary | ICD-10-CM | POA: Diagnosis not present

## 2018-11-29 DIAGNOSIS — Z992 Dependence on renal dialysis: Secondary | ICD-10-CM | POA: Diagnosis not present

## 2018-12-01 DIAGNOSIS — Z992 Dependence on renal dialysis: Secondary | ICD-10-CM | POA: Diagnosis not present

## 2018-12-01 DIAGNOSIS — N186 End stage renal disease: Secondary | ICD-10-CM | POA: Diagnosis not present

## 2018-12-01 DIAGNOSIS — N2581 Secondary hyperparathyroidism of renal origin: Secondary | ICD-10-CM | POA: Diagnosis not present

## 2018-12-01 DIAGNOSIS — E8779 Other fluid overload: Secondary | ICD-10-CM | POA: Diagnosis not present

## 2018-12-04 DIAGNOSIS — N186 End stage renal disease: Secondary | ICD-10-CM | POA: Diagnosis not present

## 2018-12-04 DIAGNOSIS — Z992 Dependence on renal dialysis: Secondary | ICD-10-CM | POA: Diagnosis not present

## 2018-12-04 DIAGNOSIS — E8779 Other fluid overload: Secondary | ICD-10-CM | POA: Diagnosis not present

## 2018-12-04 DIAGNOSIS — N2581 Secondary hyperparathyroidism of renal origin: Secondary | ICD-10-CM | POA: Diagnosis not present

## 2018-12-05 DIAGNOSIS — N2581 Secondary hyperparathyroidism of renal origin: Secondary | ICD-10-CM | POA: Diagnosis not present

## 2018-12-05 DIAGNOSIS — E8779 Other fluid overload: Secondary | ICD-10-CM | POA: Diagnosis not present

## 2018-12-05 DIAGNOSIS — N186 End stage renal disease: Secondary | ICD-10-CM | POA: Diagnosis not present

## 2018-12-05 DIAGNOSIS — Z992 Dependence on renal dialysis: Secondary | ICD-10-CM | POA: Diagnosis not present

## 2018-12-06 DIAGNOSIS — Z992 Dependence on renal dialysis: Secondary | ICD-10-CM | POA: Diagnosis not present

## 2018-12-06 DIAGNOSIS — E8779 Other fluid overload: Secondary | ICD-10-CM | POA: Diagnosis not present

## 2018-12-06 DIAGNOSIS — N186 End stage renal disease: Secondary | ICD-10-CM | POA: Diagnosis not present

## 2018-12-06 DIAGNOSIS — N2581 Secondary hyperparathyroidism of renal origin: Secondary | ICD-10-CM | POA: Diagnosis not present

## 2018-12-07 DIAGNOSIS — E785 Hyperlipidemia, unspecified: Secondary | ICD-10-CM | POA: Diagnosis not present

## 2018-12-07 DIAGNOSIS — Z1331 Encounter for screening for depression: Secondary | ICD-10-CM | POA: Diagnosis not present

## 2018-12-07 DIAGNOSIS — I129 Hypertensive chronic kidney disease with stage 1 through stage 4 chronic kidney disease, or unspecified chronic kidney disease: Secondary | ICD-10-CM | POA: Diagnosis not present

## 2018-12-07 DIAGNOSIS — R809 Proteinuria, unspecified: Secondary | ICD-10-CM | POA: Diagnosis not present

## 2018-12-07 DIAGNOSIS — E1129 Type 2 diabetes mellitus with other diabetic kidney complication: Secondary | ICD-10-CM | POA: Diagnosis not present

## 2018-12-07 DIAGNOSIS — E1165 Type 2 diabetes mellitus with hyperglycemia: Secondary | ICD-10-CM | POA: Diagnosis not present

## 2018-12-07 DIAGNOSIS — Z9181 History of falling: Secondary | ICD-10-CM | POA: Diagnosis not present

## 2018-12-07 DIAGNOSIS — M4802 Spinal stenosis, cervical region: Secondary | ICD-10-CM | POA: Diagnosis not present

## 2018-12-07 DIAGNOSIS — Z23 Encounter for immunization: Secondary | ICD-10-CM | POA: Diagnosis not present

## 2018-12-07 DIAGNOSIS — M47812 Spondylosis without myelopathy or radiculopathy, cervical region: Secondary | ICD-10-CM | POA: Diagnosis not present

## 2018-12-07 DIAGNOSIS — E039 Hypothyroidism, unspecified: Secondary | ICD-10-CM | POA: Diagnosis not present

## 2018-12-08 DIAGNOSIS — N2581 Secondary hyperparathyroidism of renal origin: Secondary | ICD-10-CM | POA: Diagnosis not present

## 2018-12-08 DIAGNOSIS — Z992 Dependence on renal dialysis: Secondary | ICD-10-CM | POA: Diagnosis not present

## 2018-12-08 DIAGNOSIS — E8779 Other fluid overload: Secondary | ICD-10-CM | POA: Diagnosis not present

## 2018-12-08 DIAGNOSIS — N186 End stage renal disease: Secondary | ICD-10-CM | POA: Diagnosis not present

## 2018-12-11 DIAGNOSIS — Z992 Dependence on renal dialysis: Secondary | ICD-10-CM | POA: Diagnosis not present

## 2018-12-11 DIAGNOSIS — N186 End stage renal disease: Secondary | ICD-10-CM | POA: Diagnosis not present

## 2018-12-11 DIAGNOSIS — E8779 Other fluid overload: Secondary | ICD-10-CM | POA: Diagnosis not present

## 2018-12-11 DIAGNOSIS — N2581 Secondary hyperparathyroidism of renal origin: Secondary | ICD-10-CM | POA: Diagnosis not present

## 2018-12-13 DIAGNOSIS — E8779 Other fluid overload: Secondary | ICD-10-CM | POA: Diagnosis not present

## 2018-12-13 DIAGNOSIS — N2581 Secondary hyperparathyroidism of renal origin: Secondary | ICD-10-CM | POA: Diagnosis not present

## 2018-12-13 DIAGNOSIS — N186 End stage renal disease: Secondary | ICD-10-CM | POA: Diagnosis not present

## 2018-12-13 DIAGNOSIS — Z992 Dependence on renal dialysis: Secondary | ICD-10-CM | POA: Diagnosis not present

## 2018-12-14 IMAGING — US US RENAL
1 series · 14 of 25 positions shown · non-contrast
Comparison: Renal ultrasound dated 10/09/2015 and CT of the abdomen
pelvis dated 02/02/2015

CLINICAL DATA: 65-year-old male with acute on chronic kidney
disease.

EXAM:
RENAL / URINARY TRACT ULTRASOUND COMPLETE

[Series 1: us renal · 0.23mm/px · 14 of 31 slices shown]
[im 1/31]
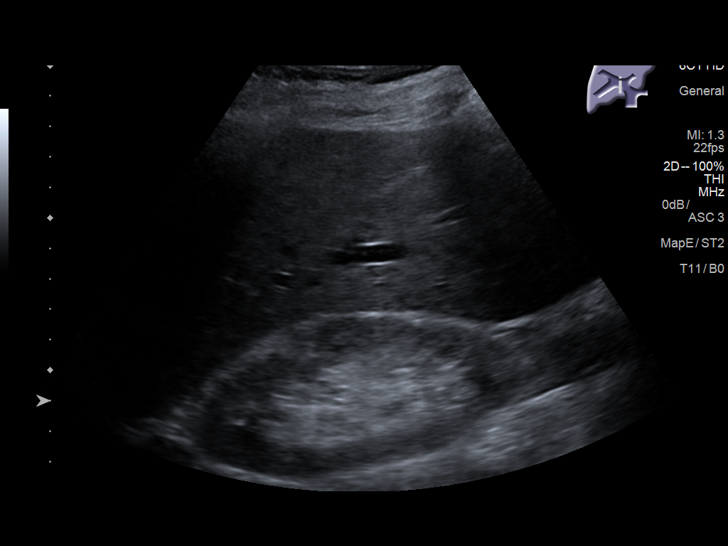
[im 3/31]
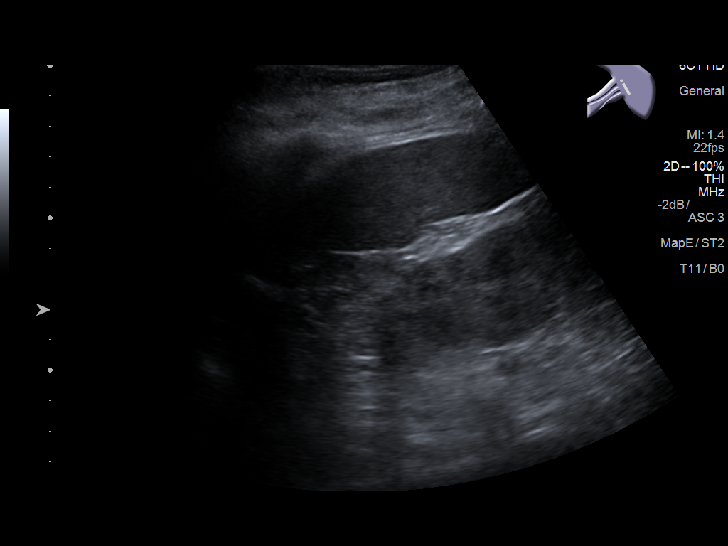
[im 6/31]
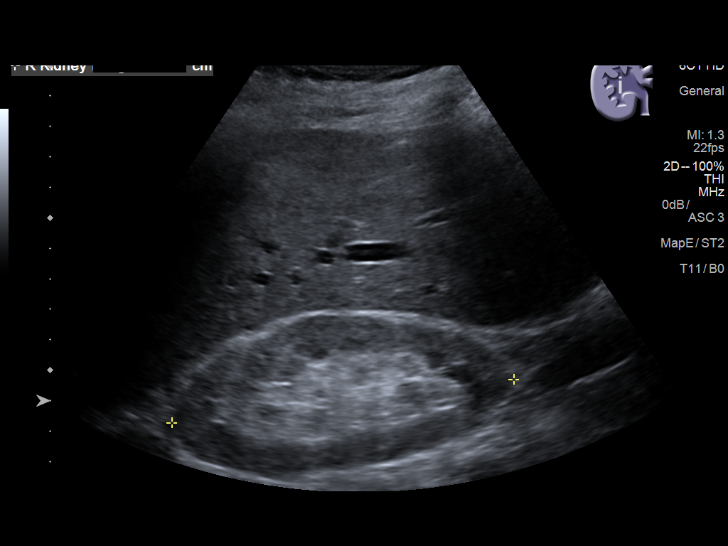
[im 8/31]
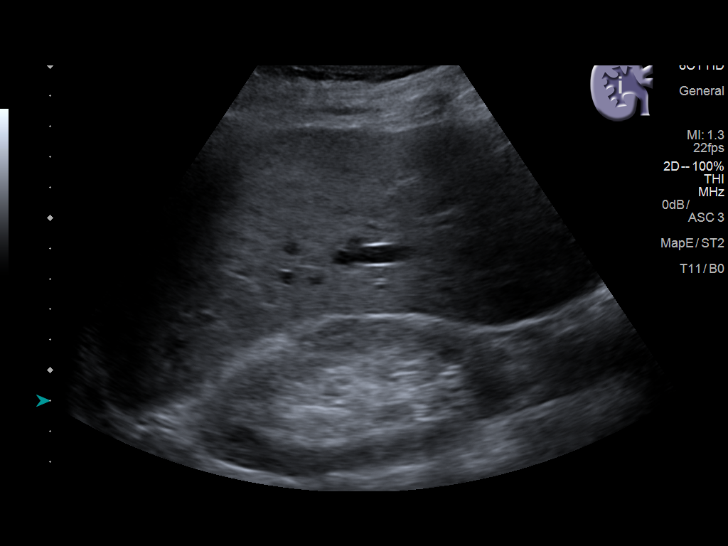
[im 11/31]
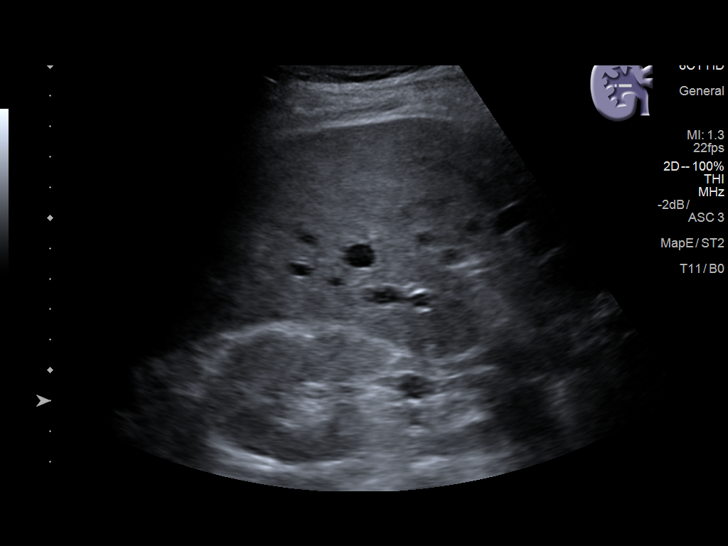
[im 12/31]
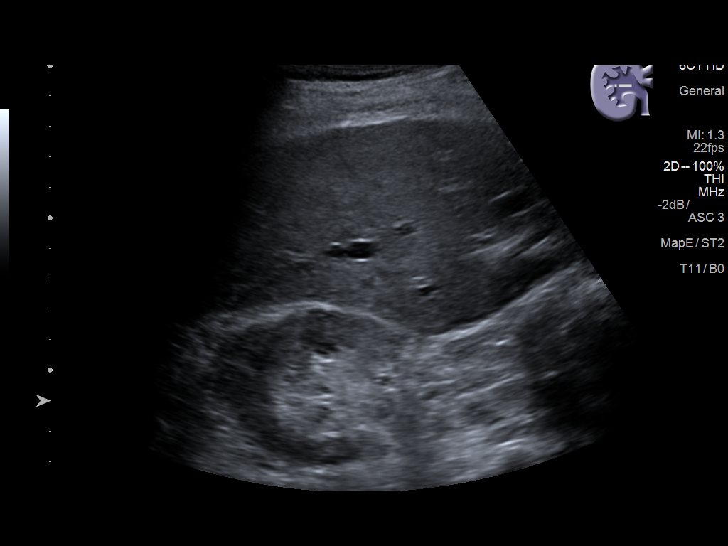
[im 14/31]
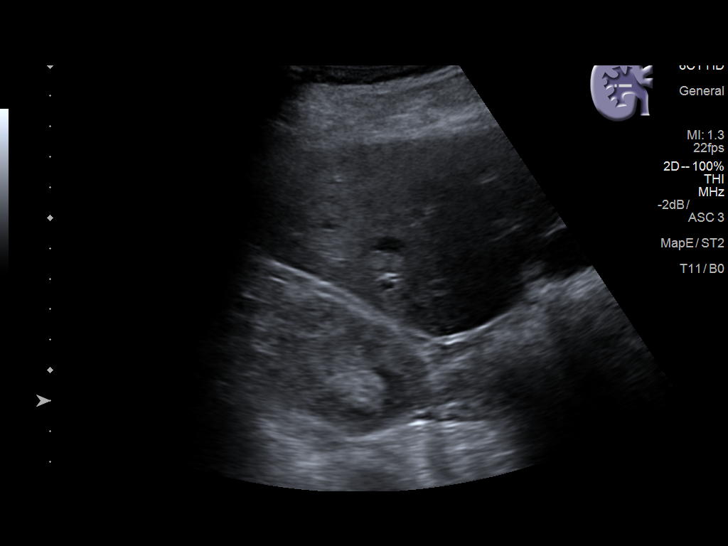
[im 17/31]
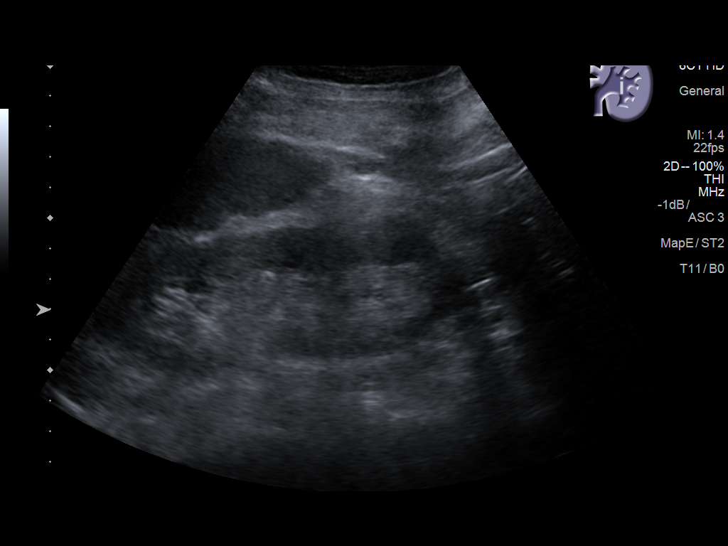
[im 19/31]
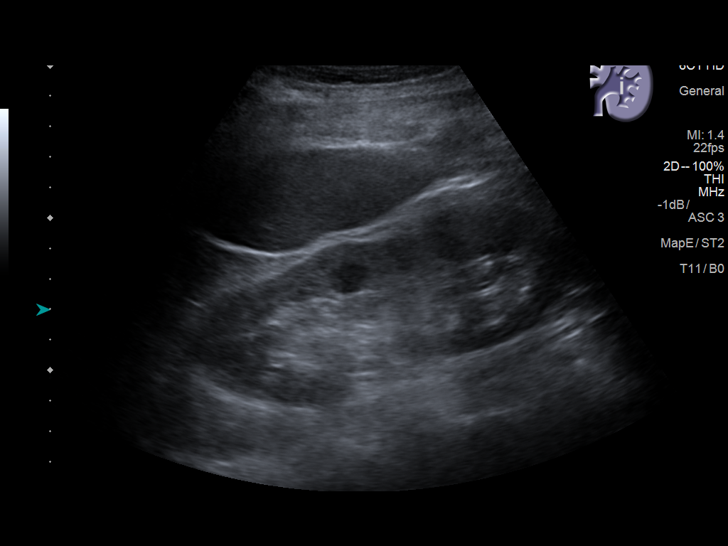
[im 21/31]
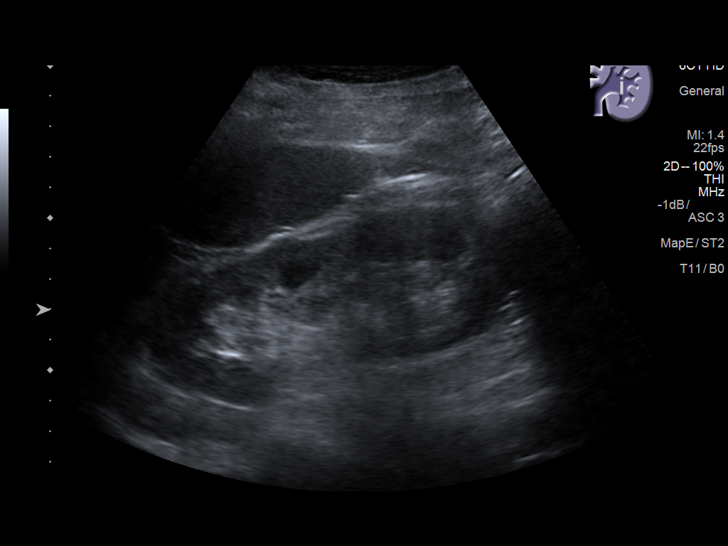
[im 23/31]
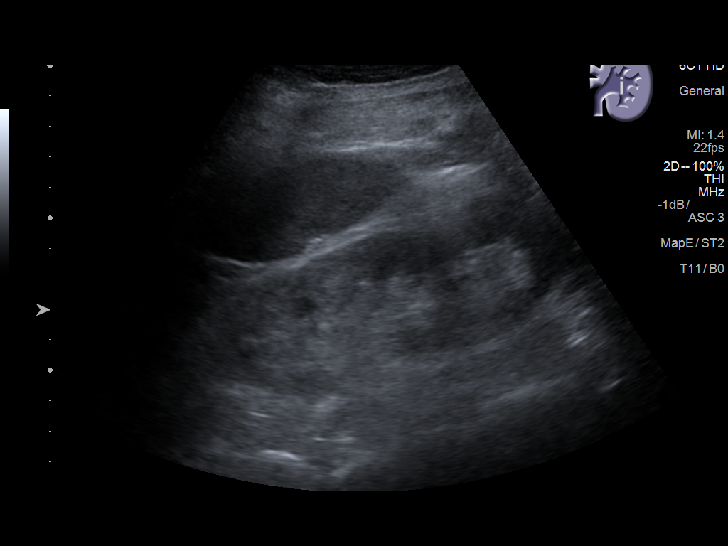
[im 26/31]
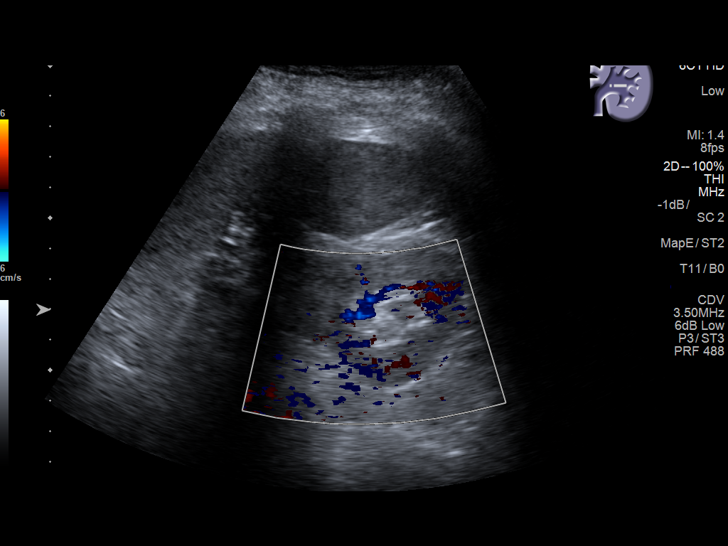
[im 28/31]
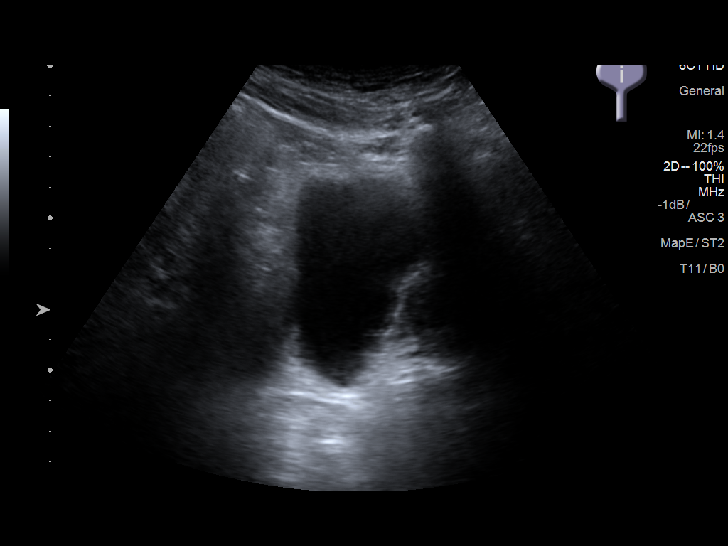
[im 31/31]
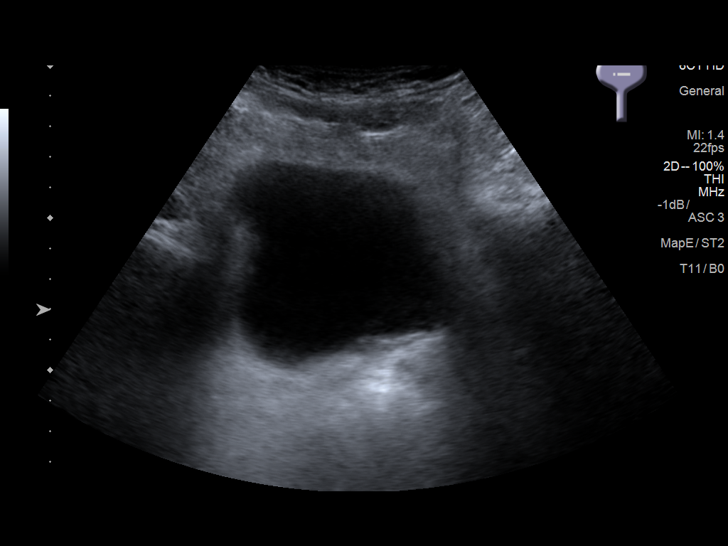

[14 of 25 positions shown; findings below may reference images not displayed]

FINDINGS: Right Kidney:

Length: 11 cm. Mild increased echogenicity. No hydronephrosis or
echogenic stone.

Left Kidney:

Length: 12 cm. Mild increased echogenicity. No hydronephrosis or
echogenic stone.

Bladder:

Appears normal for degree of bladder distention.
IMPRESSION: Upper limits of normal echogenicity may be related to chronic kidney
disease. No hydronephrosis or echogenic stone.

## 2018-12-15 DIAGNOSIS — N186 End stage renal disease: Secondary | ICD-10-CM | POA: Diagnosis not present

## 2018-12-15 DIAGNOSIS — Z992 Dependence on renal dialysis: Secondary | ICD-10-CM | POA: Diagnosis not present

## 2018-12-15 DIAGNOSIS — E8779 Other fluid overload: Secondary | ICD-10-CM | POA: Diagnosis not present

## 2018-12-15 DIAGNOSIS — N2581 Secondary hyperparathyroidism of renal origin: Secondary | ICD-10-CM | POA: Diagnosis not present

## 2018-12-18 DIAGNOSIS — N2581 Secondary hyperparathyroidism of renal origin: Secondary | ICD-10-CM | POA: Diagnosis not present

## 2018-12-18 DIAGNOSIS — E8779 Other fluid overload: Secondary | ICD-10-CM | POA: Diagnosis not present

## 2018-12-18 DIAGNOSIS — N186 End stage renal disease: Secondary | ICD-10-CM | POA: Diagnosis not present

## 2018-12-18 DIAGNOSIS — Z992 Dependence on renal dialysis: Secondary | ICD-10-CM | POA: Diagnosis not present

## 2018-12-18 IMAGING — US US BIOPSY
1 series · 13 of 17 positions shown · non-contrast
Comparison: none

INDICATION: Nephron ex syndrome

[Series 1: us biopsy · 0.22mm/px · 13 of 17 slices shown]
[im 1/17]
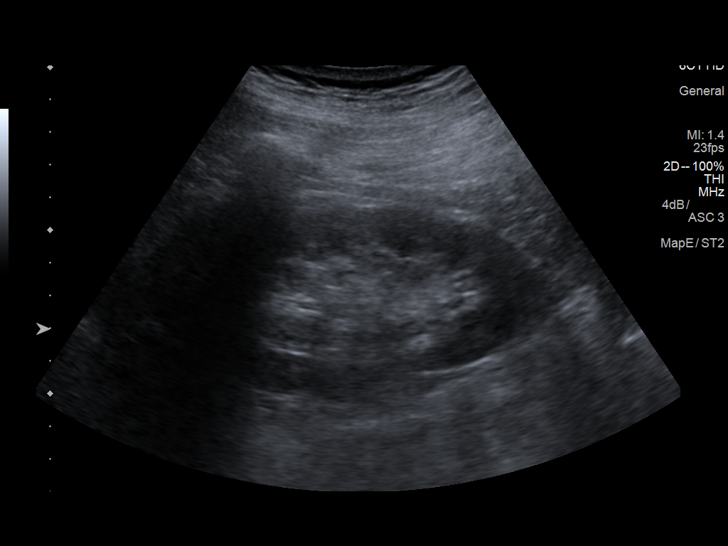
[im 2/17]
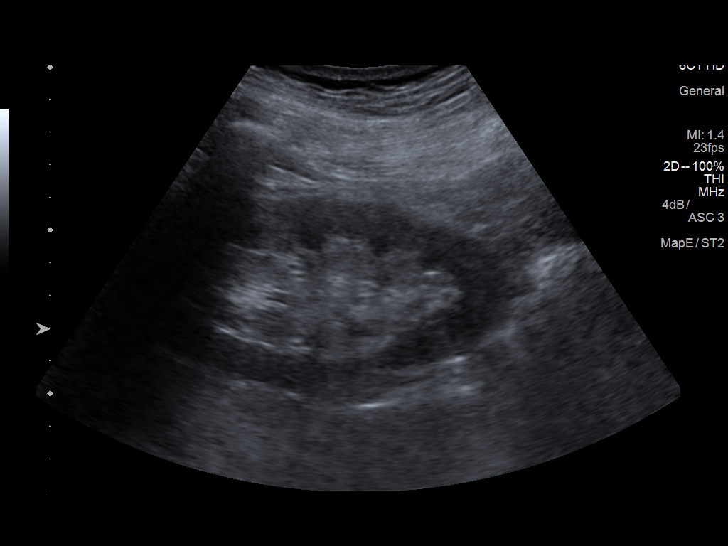
[im 4/17]
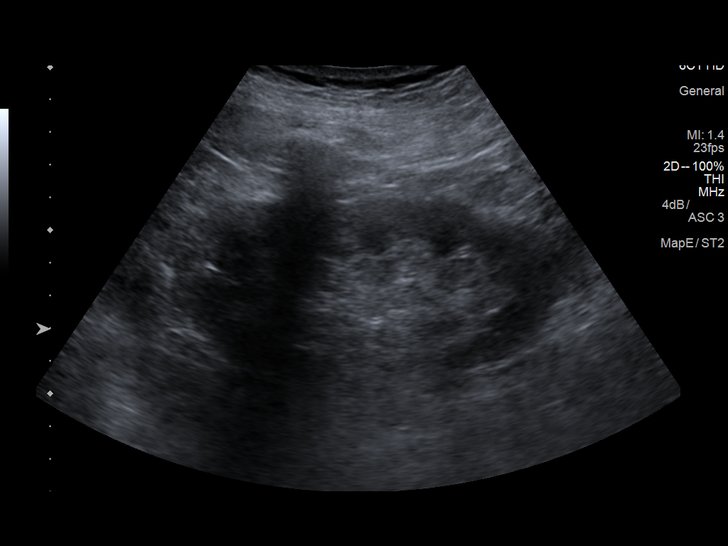
[im 5/17]
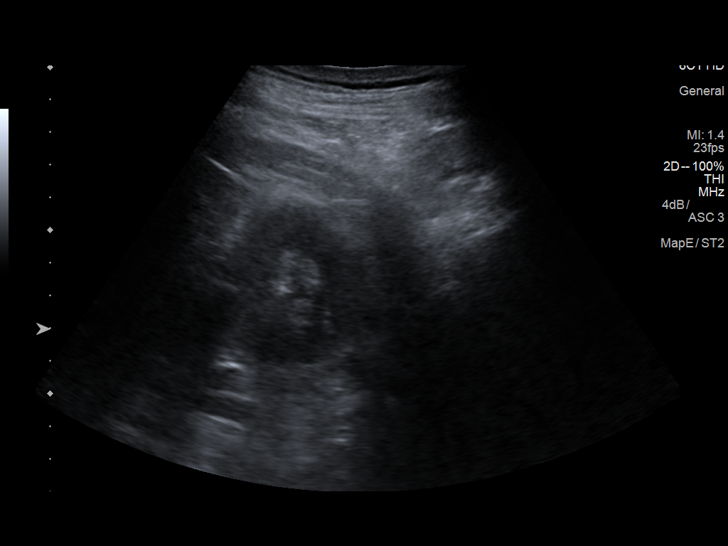
[im 6/17]
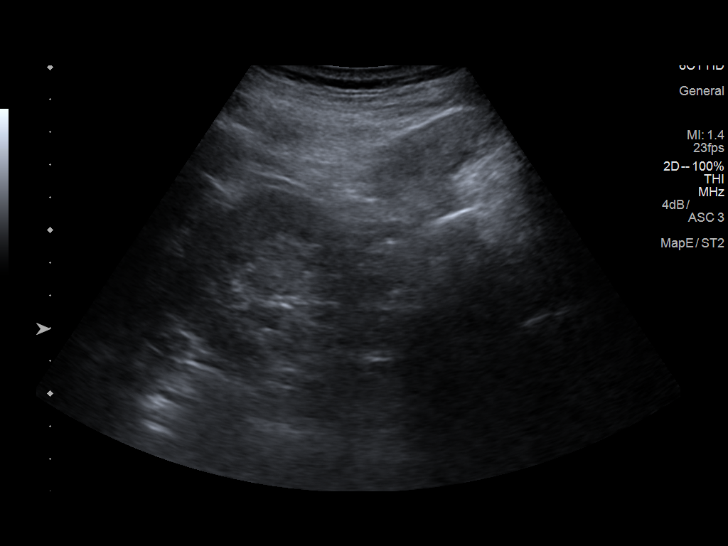
[im 8/17]
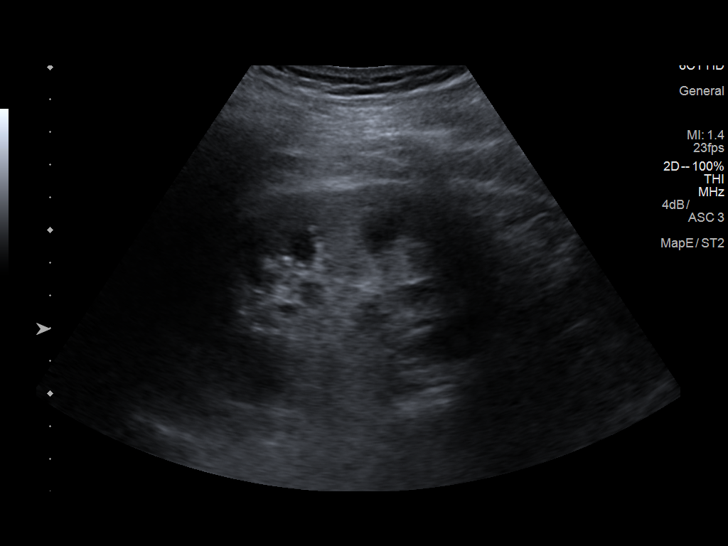
[im 9/17]
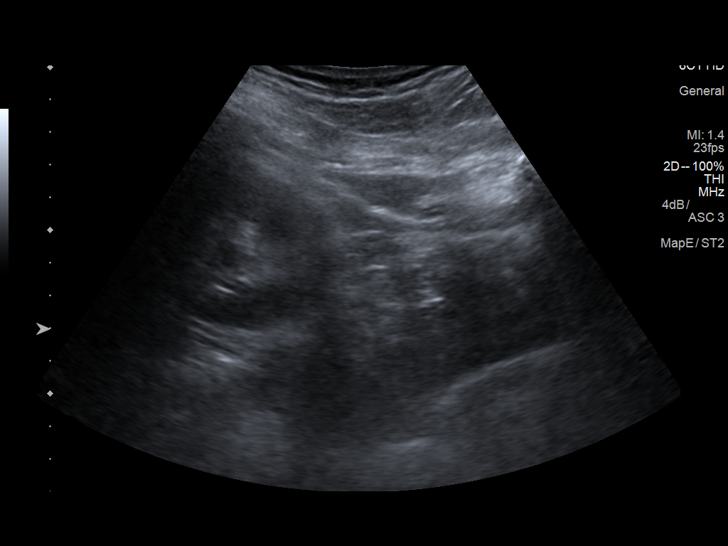
[im 10/17]
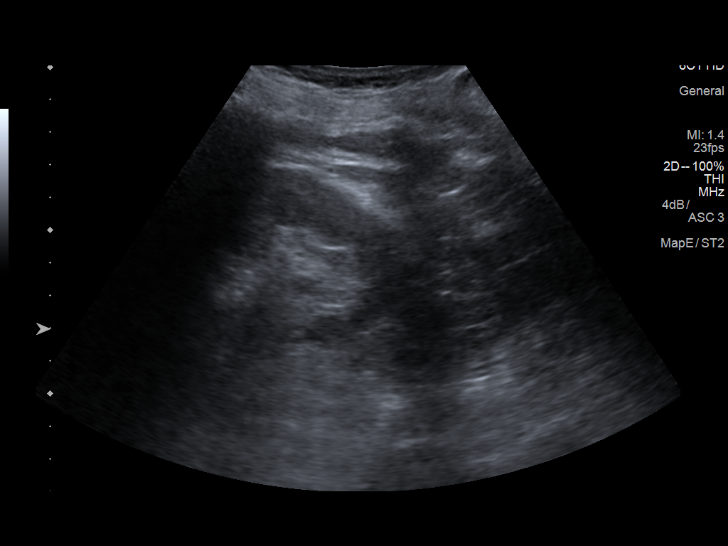
[im 12/17]
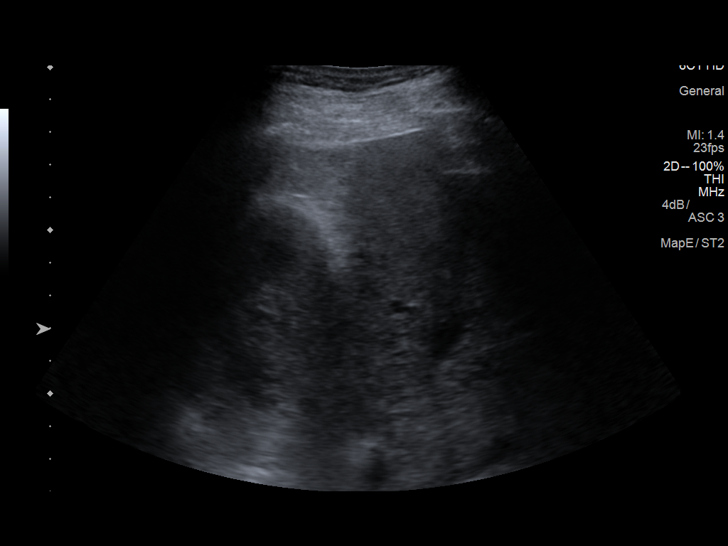
[im 13/17]
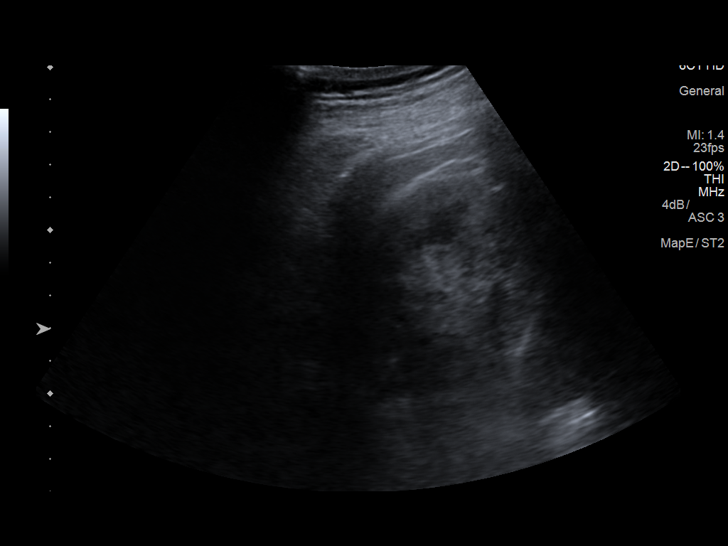
[im 14/17]
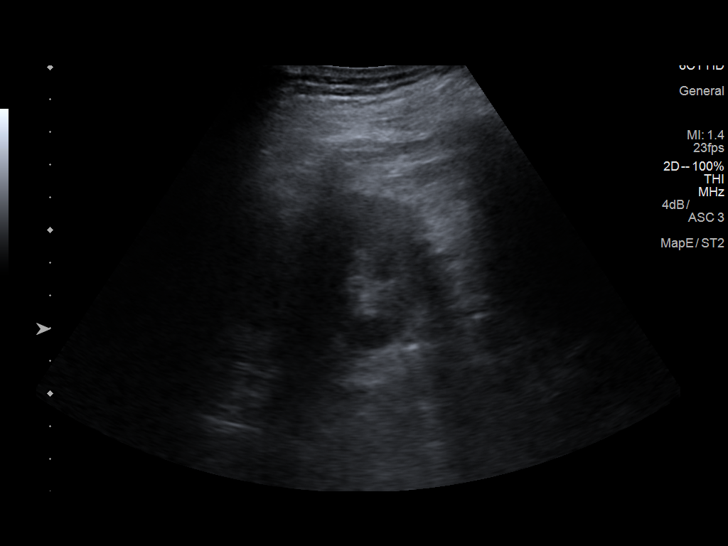
[im 16/17]
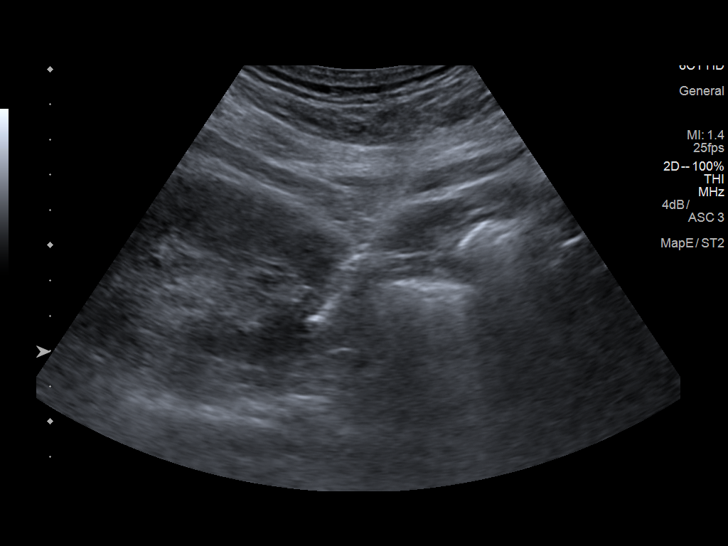
[im 17/17]
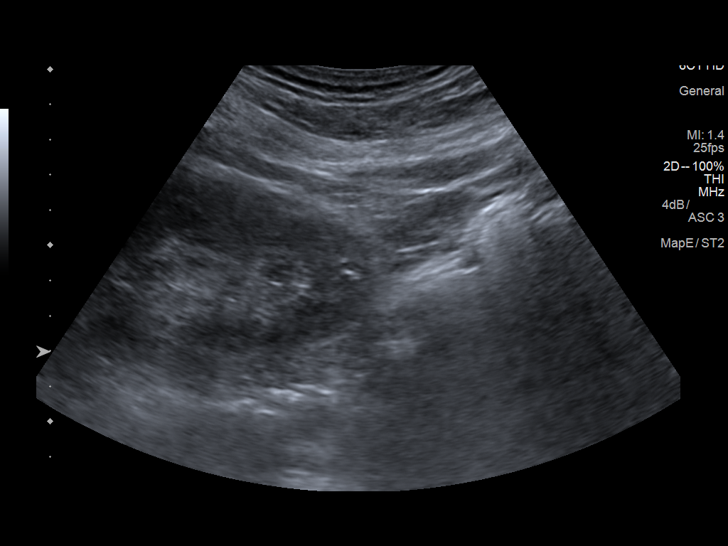

[13 of 17 positions shown; findings below may reference images not displayed]

EXAM:
ULTRASOUND-GUIDED BIOPSY RANDOM RENAL CORTEX.  CORE.

MEDICATIONS:
None.

ANESTHESIA/SEDATION:
Fentanyl 50 mcg IV; Versed 1.5 mg IV

Moderate Sedation Time:  10

The patient was continuously monitored during the procedure by the
interventional radiology nurse under my direct supervision.

FLUOROSCOPY TIME:  None

COMPLICATIONS:
None immediate.

PROCEDURE:
Informed written consent was obtained from the patient after a
thorough discussion of the procedural risks, benefits and
alternatives. All questions were addressed. Maximal Sterile Barrier
Technique was utilized including caps, mask, sterile gowns, sterile
gloves, sterile drape, hand hygiene and skin antiseptic. A timeout
was performed prior to the initiation of the procedure.

The back was prepped with ChloraPrep in a sterile fashion, and a
sterile drape was applied covering the operative field. A sterile
gown and sterile gloves were used for the procedure.

Under sonographic guidance, 2 16 gauge core biopsies of the lower
pole left renal cortex were obtained. Final imaging was performed.

Patient tolerated the procedure well without complication. Vital
sign monitoring by nursing staff during the procedure will continue
as patient is in the special procedures unit for post procedure
observation.
FINDINGS: The images document guide needle placement within the left lower
pole renal cortex. Post biopsy images demonstrate no hemorrhage.
IMPRESSION: Successful ultrasound-guided core biopsy of the left lower pole
renal cortex.

## 2018-12-20 DIAGNOSIS — N186 End stage renal disease: Secondary | ICD-10-CM | POA: Diagnosis not present

## 2018-12-20 DIAGNOSIS — Z992 Dependence on renal dialysis: Secondary | ICD-10-CM | POA: Diagnosis not present

## 2018-12-20 DIAGNOSIS — N2581 Secondary hyperparathyroidism of renal origin: Secondary | ICD-10-CM | POA: Diagnosis not present

## 2018-12-20 DIAGNOSIS — E8779 Other fluid overload: Secondary | ICD-10-CM | POA: Diagnosis not present

## 2018-12-22 DIAGNOSIS — N186 End stage renal disease: Secondary | ICD-10-CM | POA: Diagnosis not present

## 2018-12-22 DIAGNOSIS — N2581 Secondary hyperparathyroidism of renal origin: Secondary | ICD-10-CM | POA: Diagnosis not present

## 2018-12-22 DIAGNOSIS — Z992 Dependence on renal dialysis: Secondary | ICD-10-CM | POA: Diagnosis not present

## 2018-12-22 DIAGNOSIS — E8779 Other fluid overload: Secondary | ICD-10-CM | POA: Diagnosis not present

## 2018-12-25 DIAGNOSIS — Z992 Dependence on renal dialysis: Secondary | ICD-10-CM | POA: Diagnosis not present

## 2018-12-25 DIAGNOSIS — N2581 Secondary hyperparathyroidism of renal origin: Secondary | ICD-10-CM | POA: Diagnosis not present

## 2018-12-25 DIAGNOSIS — N186 End stage renal disease: Secondary | ICD-10-CM | POA: Diagnosis not present

## 2018-12-25 DIAGNOSIS — E8779 Other fluid overload: Secondary | ICD-10-CM | POA: Diagnosis not present

## 2018-12-26 DIAGNOSIS — E8779 Other fluid overload: Secondary | ICD-10-CM | POA: Diagnosis not present

## 2018-12-26 DIAGNOSIS — N186 End stage renal disease: Secondary | ICD-10-CM | POA: Diagnosis not present

## 2018-12-26 DIAGNOSIS — N2581 Secondary hyperparathyroidism of renal origin: Secondary | ICD-10-CM | POA: Diagnosis not present

## 2018-12-26 DIAGNOSIS — Z992 Dependence on renal dialysis: Secondary | ICD-10-CM | POA: Diagnosis not present

## 2018-12-26 DIAGNOSIS — E1129 Type 2 diabetes mellitus with other diabetic kidney complication: Secondary | ICD-10-CM | POA: Diagnosis not present

## 2018-12-27 DIAGNOSIS — N2581 Secondary hyperparathyroidism of renal origin: Secondary | ICD-10-CM | POA: Diagnosis not present

## 2018-12-27 DIAGNOSIS — N186 End stage renal disease: Secondary | ICD-10-CM | POA: Diagnosis not present

## 2018-12-27 DIAGNOSIS — Z992 Dependence on renal dialysis: Secondary | ICD-10-CM | POA: Diagnosis not present

## 2018-12-29 DIAGNOSIS — N2581 Secondary hyperparathyroidism of renal origin: Secondary | ICD-10-CM | POA: Diagnosis not present

## 2018-12-29 DIAGNOSIS — N186 End stage renal disease: Secondary | ICD-10-CM | POA: Diagnosis not present

## 2018-12-29 DIAGNOSIS — Z992 Dependence on renal dialysis: Secondary | ICD-10-CM | POA: Diagnosis not present

## 2019-01-01 DIAGNOSIS — Z992 Dependence on renal dialysis: Secondary | ICD-10-CM | POA: Diagnosis not present

## 2019-01-01 DIAGNOSIS — N186 End stage renal disease: Secondary | ICD-10-CM | POA: Diagnosis not present

## 2019-01-01 DIAGNOSIS — N2581 Secondary hyperparathyroidism of renal origin: Secondary | ICD-10-CM | POA: Diagnosis not present

## 2019-01-03 DIAGNOSIS — Z992 Dependence on renal dialysis: Secondary | ICD-10-CM | POA: Diagnosis not present

## 2019-01-03 DIAGNOSIS — N186 End stage renal disease: Secondary | ICD-10-CM | POA: Diagnosis not present

## 2019-01-03 DIAGNOSIS — N2581 Secondary hyperparathyroidism of renal origin: Secondary | ICD-10-CM | POA: Diagnosis not present

## 2019-01-05 DIAGNOSIS — N186 End stage renal disease: Secondary | ICD-10-CM | POA: Diagnosis not present

## 2019-01-05 DIAGNOSIS — N2581 Secondary hyperparathyroidism of renal origin: Secondary | ICD-10-CM | POA: Diagnosis not present

## 2019-01-05 DIAGNOSIS — Z992 Dependence on renal dialysis: Secondary | ICD-10-CM | POA: Diagnosis not present

## 2019-01-08 DIAGNOSIS — Z992 Dependence on renal dialysis: Secondary | ICD-10-CM | POA: Diagnosis not present

## 2019-01-08 DIAGNOSIS — N186 End stage renal disease: Secondary | ICD-10-CM | POA: Diagnosis not present

## 2019-01-08 DIAGNOSIS — N2581 Secondary hyperparathyroidism of renal origin: Secondary | ICD-10-CM | POA: Diagnosis not present

## 2019-01-09 DIAGNOSIS — Z125 Encounter for screening for malignant neoplasm of prostate: Secondary | ICD-10-CM | POA: Diagnosis not present

## 2019-01-09 DIAGNOSIS — Z1211 Encounter for screening for malignant neoplasm of colon: Secondary | ICD-10-CM | POA: Diagnosis not present

## 2019-01-09 DIAGNOSIS — Z9181 History of falling: Secondary | ICD-10-CM | POA: Diagnosis not present

## 2019-01-09 DIAGNOSIS — Z6831 Body mass index (BMI) 31.0-31.9, adult: Secondary | ICD-10-CM | POA: Diagnosis not present

## 2019-01-09 DIAGNOSIS — Z0000 Encounter for general adult medical examination without abnormal findings: Secondary | ICD-10-CM | POA: Diagnosis not present

## 2019-01-09 DIAGNOSIS — E785 Hyperlipidemia, unspecified: Secondary | ICD-10-CM | POA: Diagnosis not present

## 2019-01-09 DIAGNOSIS — Z8631 Personal history of diabetic foot ulcer: Secondary | ICD-10-CM | POA: Diagnosis not present

## 2019-01-09 DIAGNOSIS — E1140 Type 2 diabetes mellitus with diabetic neuropathy, unspecified: Secondary | ICD-10-CM | POA: Diagnosis not present

## 2019-01-09 DIAGNOSIS — Z1331 Encounter for screening for depression: Secondary | ICD-10-CM | POA: Diagnosis not present

## 2019-01-10 DIAGNOSIS — N2581 Secondary hyperparathyroidism of renal origin: Secondary | ICD-10-CM | POA: Diagnosis not present

## 2019-01-10 DIAGNOSIS — Z992 Dependence on renal dialysis: Secondary | ICD-10-CM | POA: Diagnosis not present

## 2019-01-10 DIAGNOSIS — N186 End stage renal disease: Secondary | ICD-10-CM | POA: Diagnosis not present

## 2019-01-12 DIAGNOSIS — N2581 Secondary hyperparathyroidism of renal origin: Secondary | ICD-10-CM | POA: Diagnosis not present

## 2019-01-12 DIAGNOSIS — N186 End stage renal disease: Secondary | ICD-10-CM | POA: Diagnosis not present

## 2019-01-12 DIAGNOSIS — Z992 Dependence on renal dialysis: Secondary | ICD-10-CM | POA: Diagnosis not present

## 2019-01-15 DIAGNOSIS — N2581 Secondary hyperparathyroidism of renal origin: Secondary | ICD-10-CM | POA: Diagnosis not present

## 2019-01-15 DIAGNOSIS — Z992 Dependence on renal dialysis: Secondary | ICD-10-CM | POA: Diagnosis not present

## 2019-01-15 DIAGNOSIS — N186 End stage renal disease: Secondary | ICD-10-CM | POA: Diagnosis not present

## 2019-01-17 DIAGNOSIS — N2581 Secondary hyperparathyroidism of renal origin: Secondary | ICD-10-CM | POA: Diagnosis not present

## 2019-01-17 DIAGNOSIS — Z992 Dependence on renal dialysis: Secondary | ICD-10-CM | POA: Diagnosis not present

## 2019-01-17 DIAGNOSIS — N186 End stage renal disease: Secondary | ICD-10-CM | POA: Diagnosis not present

## 2019-01-19 DIAGNOSIS — Z992 Dependence on renal dialysis: Secondary | ICD-10-CM | POA: Diagnosis not present

## 2019-01-19 DIAGNOSIS — N186 End stage renal disease: Secondary | ICD-10-CM | POA: Diagnosis not present

## 2019-01-19 DIAGNOSIS — N2581 Secondary hyperparathyroidism of renal origin: Secondary | ICD-10-CM | POA: Diagnosis not present

## 2019-01-22 DIAGNOSIS — Z992 Dependence on renal dialysis: Secondary | ICD-10-CM | POA: Diagnosis not present

## 2019-01-22 DIAGNOSIS — N186 End stage renal disease: Secondary | ICD-10-CM | POA: Diagnosis not present

## 2019-01-22 DIAGNOSIS — N2581 Secondary hyperparathyroidism of renal origin: Secondary | ICD-10-CM | POA: Diagnosis not present

## 2019-01-24 DIAGNOSIS — N2581 Secondary hyperparathyroidism of renal origin: Secondary | ICD-10-CM | POA: Diagnosis not present

## 2019-01-24 DIAGNOSIS — N186 End stage renal disease: Secondary | ICD-10-CM | POA: Diagnosis not present

## 2019-01-24 DIAGNOSIS — Z992 Dependence on renal dialysis: Secondary | ICD-10-CM | POA: Diagnosis not present

## 2019-01-26 DIAGNOSIS — E1129 Type 2 diabetes mellitus with other diabetic kidney complication: Secondary | ICD-10-CM | POA: Diagnosis not present

## 2019-01-26 DIAGNOSIS — N2581 Secondary hyperparathyroidism of renal origin: Secondary | ICD-10-CM | POA: Diagnosis not present

## 2019-01-26 DIAGNOSIS — N186 End stage renal disease: Secondary | ICD-10-CM | POA: Diagnosis not present

## 2019-01-26 DIAGNOSIS — Z992 Dependence on renal dialysis: Secondary | ICD-10-CM | POA: Diagnosis not present

## 2019-01-29 DIAGNOSIS — N2581 Secondary hyperparathyroidism of renal origin: Secondary | ICD-10-CM | POA: Diagnosis not present

## 2019-01-29 DIAGNOSIS — N186 End stage renal disease: Secondary | ICD-10-CM | POA: Diagnosis not present

## 2019-01-29 DIAGNOSIS — E8779 Other fluid overload: Secondary | ICD-10-CM | POA: Diagnosis not present

## 2019-01-29 DIAGNOSIS — Z992 Dependence on renal dialysis: Secondary | ICD-10-CM | POA: Diagnosis not present

## 2019-01-30 DIAGNOSIS — N186 End stage renal disease: Secondary | ICD-10-CM | POA: Diagnosis not present

## 2019-01-30 DIAGNOSIS — E8779 Other fluid overload: Secondary | ICD-10-CM | POA: Diagnosis not present

## 2019-01-30 DIAGNOSIS — Z992 Dependence on renal dialysis: Secondary | ICD-10-CM | POA: Diagnosis not present

## 2019-01-30 DIAGNOSIS — N2581 Secondary hyperparathyroidism of renal origin: Secondary | ICD-10-CM | POA: Diagnosis not present

## 2019-01-31 DIAGNOSIS — Z992 Dependence on renal dialysis: Secondary | ICD-10-CM | POA: Diagnosis not present

## 2019-01-31 DIAGNOSIS — J440 Chronic obstructive pulmonary disease with acute lower respiratory infection: Secondary | ICD-10-CM | POA: Diagnosis not present

## 2019-01-31 DIAGNOSIS — E8779 Other fluid overload: Secondary | ICD-10-CM | POA: Diagnosis not present

## 2019-01-31 DIAGNOSIS — N2581 Secondary hyperparathyroidism of renal origin: Secondary | ICD-10-CM | POA: Diagnosis not present

## 2019-01-31 DIAGNOSIS — N186 End stage renal disease: Secondary | ICD-10-CM | POA: Diagnosis not present

## 2019-02-02 DIAGNOSIS — Z992 Dependence on renal dialysis: Secondary | ICD-10-CM | POA: Diagnosis not present

## 2019-02-02 DIAGNOSIS — N2581 Secondary hyperparathyroidism of renal origin: Secondary | ICD-10-CM | POA: Diagnosis not present

## 2019-02-02 DIAGNOSIS — N186 End stage renal disease: Secondary | ICD-10-CM | POA: Diagnosis not present

## 2019-02-02 DIAGNOSIS — E8779 Other fluid overload: Secondary | ICD-10-CM | POA: Diagnosis not present

## 2019-02-05 DIAGNOSIS — E8779 Other fluid overload: Secondary | ICD-10-CM | POA: Diagnosis not present

## 2019-02-05 DIAGNOSIS — Z992 Dependence on renal dialysis: Secondary | ICD-10-CM | POA: Diagnosis not present

## 2019-02-05 DIAGNOSIS — N2581 Secondary hyperparathyroidism of renal origin: Secondary | ICD-10-CM | POA: Diagnosis not present

## 2019-02-05 DIAGNOSIS — N186 End stage renal disease: Secondary | ICD-10-CM | POA: Diagnosis not present

## 2019-02-07 DIAGNOSIS — N2581 Secondary hyperparathyroidism of renal origin: Secondary | ICD-10-CM | POA: Diagnosis not present

## 2019-02-07 DIAGNOSIS — Z992 Dependence on renal dialysis: Secondary | ICD-10-CM | POA: Diagnosis not present

## 2019-02-07 DIAGNOSIS — N186 End stage renal disease: Secondary | ICD-10-CM | POA: Diagnosis not present

## 2019-02-07 DIAGNOSIS — E8779 Other fluid overload: Secondary | ICD-10-CM | POA: Diagnosis not present

## 2019-02-08 DIAGNOSIS — E8779 Other fluid overload: Secondary | ICD-10-CM | POA: Diagnosis not present

## 2019-02-08 DIAGNOSIS — Z992 Dependence on renal dialysis: Secondary | ICD-10-CM | POA: Diagnosis not present

## 2019-02-08 DIAGNOSIS — N2581 Secondary hyperparathyroidism of renal origin: Secondary | ICD-10-CM | POA: Diagnosis not present

## 2019-02-08 DIAGNOSIS — N186 End stage renal disease: Secondary | ICD-10-CM | POA: Diagnosis not present

## 2019-02-09 DIAGNOSIS — E8779 Other fluid overload: Secondary | ICD-10-CM | POA: Diagnosis not present

## 2019-02-09 DIAGNOSIS — N186 End stage renal disease: Secondary | ICD-10-CM | POA: Diagnosis not present

## 2019-02-09 DIAGNOSIS — N2581 Secondary hyperparathyroidism of renal origin: Secondary | ICD-10-CM | POA: Diagnosis not present

## 2019-02-09 DIAGNOSIS — Z992 Dependence on renal dialysis: Secondary | ICD-10-CM | POA: Diagnosis not present

## 2019-02-12 DIAGNOSIS — N186 End stage renal disease: Secondary | ICD-10-CM | POA: Diagnosis not present

## 2019-02-12 DIAGNOSIS — E8779 Other fluid overload: Secondary | ICD-10-CM | POA: Diagnosis not present

## 2019-02-12 DIAGNOSIS — N2581 Secondary hyperparathyroidism of renal origin: Secondary | ICD-10-CM | POA: Diagnosis not present

## 2019-02-12 DIAGNOSIS — Z992 Dependence on renal dialysis: Secondary | ICD-10-CM | POA: Diagnosis not present

## 2019-02-14 DIAGNOSIS — E8779 Other fluid overload: Secondary | ICD-10-CM | POA: Diagnosis not present

## 2019-02-14 DIAGNOSIS — Z992 Dependence on renal dialysis: Secondary | ICD-10-CM | POA: Diagnosis not present

## 2019-02-14 DIAGNOSIS — N2581 Secondary hyperparathyroidism of renal origin: Secondary | ICD-10-CM | POA: Diagnosis not present

## 2019-02-14 DIAGNOSIS — N186 End stage renal disease: Secondary | ICD-10-CM | POA: Diagnosis not present

## 2019-02-16 DIAGNOSIS — N2581 Secondary hyperparathyroidism of renal origin: Secondary | ICD-10-CM | POA: Diagnosis not present

## 2019-02-16 DIAGNOSIS — E8779 Other fluid overload: Secondary | ICD-10-CM | POA: Diagnosis not present

## 2019-02-16 DIAGNOSIS — N186 End stage renal disease: Secondary | ICD-10-CM | POA: Diagnosis not present

## 2019-02-16 DIAGNOSIS — Z992 Dependence on renal dialysis: Secondary | ICD-10-CM | POA: Diagnosis not present

## 2019-02-19 DIAGNOSIS — Z992 Dependence on renal dialysis: Secondary | ICD-10-CM | POA: Diagnosis not present

## 2019-02-19 DIAGNOSIS — E8779 Other fluid overload: Secondary | ICD-10-CM | POA: Diagnosis not present

## 2019-02-19 DIAGNOSIS — N2581 Secondary hyperparathyroidism of renal origin: Secondary | ICD-10-CM | POA: Diagnosis not present

## 2019-02-19 DIAGNOSIS — N186 End stage renal disease: Secondary | ICD-10-CM | POA: Diagnosis not present

## 2019-02-21 DIAGNOSIS — E8779 Other fluid overload: Secondary | ICD-10-CM | POA: Diagnosis not present

## 2019-02-21 DIAGNOSIS — N2581 Secondary hyperparathyroidism of renal origin: Secondary | ICD-10-CM | POA: Diagnosis not present

## 2019-02-21 DIAGNOSIS — Z992 Dependence on renal dialysis: Secondary | ICD-10-CM | POA: Diagnosis not present

## 2019-02-21 DIAGNOSIS — N186 End stage renal disease: Secondary | ICD-10-CM | POA: Diagnosis not present

## 2019-02-23 DIAGNOSIS — E8779 Other fluid overload: Secondary | ICD-10-CM | POA: Diagnosis not present

## 2019-02-23 DIAGNOSIS — N186 End stage renal disease: Secondary | ICD-10-CM | POA: Diagnosis not present

## 2019-02-23 DIAGNOSIS — Z992 Dependence on renal dialysis: Secondary | ICD-10-CM | POA: Diagnosis not present

## 2019-02-23 DIAGNOSIS — N2581 Secondary hyperparathyroidism of renal origin: Secondary | ICD-10-CM | POA: Diagnosis not present

## 2019-02-25 DIAGNOSIS — Z992 Dependence on renal dialysis: Secondary | ICD-10-CM | POA: Diagnosis not present

## 2019-02-25 DIAGNOSIS — E1129 Type 2 diabetes mellitus with other diabetic kidney complication: Secondary | ICD-10-CM | POA: Diagnosis not present

## 2019-02-25 DIAGNOSIS — N186 End stage renal disease: Secondary | ICD-10-CM | POA: Diagnosis not present

## 2019-02-26 DIAGNOSIS — N2581 Secondary hyperparathyroidism of renal origin: Secondary | ICD-10-CM | POA: Diagnosis not present

## 2019-02-26 DIAGNOSIS — N186 End stage renal disease: Secondary | ICD-10-CM | POA: Diagnosis not present

## 2019-02-26 DIAGNOSIS — Z992 Dependence on renal dialysis: Secondary | ICD-10-CM | POA: Diagnosis not present

## 2019-02-28 DIAGNOSIS — Z992 Dependence on renal dialysis: Secondary | ICD-10-CM | POA: Diagnosis not present

## 2019-02-28 DIAGNOSIS — N2581 Secondary hyperparathyroidism of renal origin: Secondary | ICD-10-CM | POA: Diagnosis not present

## 2019-02-28 DIAGNOSIS — N186 End stage renal disease: Secondary | ICD-10-CM | POA: Diagnosis not present

## 2019-03-02 DIAGNOSIS — J440 Chronic obstructive pulmonary disease with acute lower respiratory infection: Secondary | ICD-10-CM | POA: Diagnosis not present

## 2019-03-02 DIAGNOSIS — Z992 Dependence on renal dialysis: Secondary | ICD-10-CM | POA: Diagnosis not present

## 2019-03-02 DIAGNOSIS — N186 End stage renal disease: Secondary | ICD-10-CM | POA: Diagnosis not present

## 2019-03-02 DIAGNOSIS — N2581 Secondary hyperparathyroidism of renal origin: Secondary | ICD-10-CM | POA: Diagnosis not present

## 2019-03-05 DIAGNOSIS — N2581 Secondary hyperparathyroidism of renal origin: Secondary | ICD-10-CM | POA: Diagnosis not present

## 2019-03-05 DIAGNOSIS — Z992 Dependence on renal dialysis: Secondary | ICD-10-CM | POA: Diagnosis not present

## 2019-03-05 DIAGNOSIS — N186 End stage renal disease: Secondary | ICD-10-CM | POA: Diagnosis not present

## 2019-03-07 DIAGNOSIS — Z992 Dependence on renal dialysis: Secondary | ICD-10-CM | POA: Diagnosis not present

## 2019-03-07 DIAGNOSIS — N186 End stage renal disease: Secondary | ICD-10-CM | POA: Diagnosis not present

## 2019-03-07 DIAGNOSIS — N2581 Secondary hyperparathyroidism of renal origin: Secondary | ICD-10-CM | POA: Diagnosis not present

## 2019-03-09 DIAGNOSIS — Z992 Dependence on renal dialysis: Secondary | ICD-10-CM | POA: Diagnosis not present

## 2019-03-09 DIAGNOSIS — N186 End stage renal disease: Secondary | ICD-10-CM | POA: Diagnosis not present

## 2019-03-09 DIAGNOSIS — N2581 Secondary hyperparathyroidism of renal origin: Secondary | ICD-10-CM | POA: Diagnosis not present

## 2019-03-12 ENCOUNTER — Other Ambulatory Visit: Payer: Self-pay

## 2019-03-12 ENCOUNTER — Emergency Department (HOSPITAL_COMMUNITY)
Admission: EM | Admit: 2019-03-12 | Discharge: 2019-03-13 | Discharge status: Against Medical Advice | Payer: Medicare HMO | Attending: Emergency Medicine | Admitting: Emergency Medicine

## 2019-03-12 ENCOUNTER — Encounter (HOSPITAL_COMMUNITY): Payer: Self-pay

## 2019-03-12 DIAGNOSIS — I10 Essential (primary) hypertension: Secondary | ICD-10-CM | POA: Diagnosis not present

## 2019-03-12 DIAGNOSIS — Z992 Dependence on renal dialysis: Secondary | ICD-10-CM | POA: Diagnosis not present

## 2019-03-12 DIAGNOSIS — R4182 Altered mental status, unspecified: Secondary | ICD-10-CM | POA: Diagnosis present

## 2019-03-12 DIAGNOSIS — N2581 Secondary hyperparathyroidism of renal origin: Secondary | ICD-10-CM | POA: Diagnosis not present

## 2019-03-12 DIAGNOSIS — R0902 Hypoxemia: Secondary | ICD-10-CM | POA: Diagnosis not present

## 2019-03-12 DIAGNOSIS — Z5321 Procedure and treatment not carried out due to patient leaving prior to being seen by health care provider: Secondary | ICD-10-CM | POA: Insufficient documentation

## 2019-03-12 DIAGNOSIS — R410 Disorientation, unspecified: Secondary | ICD-10-CM | POA: Diagnosis not present

## 2019-03-12 DIAGNOSIS — R0602 Shortness of breath: Secondary | ICD-10-CM | POA: Diagnosis not present

## 2019-03-12 DIAGNOSIS — N186 End stage renal disease: Secondary | ICD-10-CM | POA: Diagnosis not present

## 2019-03-12 DIAGNOSIS — R404 Transient alteration of awareness: Secondary | ICD-10-CM | POA: Diagnosis not present

## 2019-03-12 LAB — CBC
HCT: 28.9 % — ABNORMAL LOW (ref 39.0–52.0)
Hemoglobin: 9.7 g/dL — ABNORMAL LOW (ref 13.0–17.0)
MCH: 31.2 pg (ref 26.0–34.0)
MCHC: 33.6 g/dL (ref 30.0–36.0)
MCV: 92.9 fL (ref 80.0–100.0)
Platelets: 143 10*3/uL — ABNORMAL LOW (ref 150–400)
RBC: 3.11 MIL/uL — ABNORMAL LOW (ref 4.22–5.81)
RDW: 14.4 % (ref 11.5–15.5)
WBC: 5.8 10*3/uL (ref 4.0–10.5)
nRBC: 0 % (ref 0.0–0.2)

## 2019-03-12 LAB — COMPREHENSIVE METABOLIC PANEL
ALT: 21 U/L (ref 0–44)
AST: 36 U/L (ref 15–41)
Albumin: 3.7 g/dL (ref 3.5–5.0)
Alkaline Phosphatase: 86 U/L (ref 38–126)
Anion gap: 14 (ref 5–15)
BUN: 13 mg/dL (ref 8–23)
CO2: 29 mmol/L (ref 22–32)
Calcium: 8.7 mg/dL — ABNORMAL LOW (ref 8.9–10.3)
Chloride: 95 mmol/L — ABNORMAL LOW (ref 98–111)
Creatinine, Ser: 3.53 mg/dL — ABNORMAL HIGH (ref 0.61–1.24)
GFR calc Af Amer: 19 mL/min — ABNORMAL LOW (ref >60)
GFR calc non Af Amer: 17 mL/min — ABNORMAL LOW (ref >60)
Glucose, Bld: 227 mg/dL — ABNORMAL HIGH (ref 70–99)
Potassium: 3.3 mmol/L — ABNORMAL LOW (ref 3.5–5.1)
Sodium: 138 mmol/L (ref 135–145)
Total Bilirubin: 0.5 mg/dL (ref 0.3–1.2)
Total Protein: 6.8 g/dL (ref 6.5–8.1)

## 2019-03-12 LAB — CBG MONITORING, ED
Glucose-Capillary: 214 mg/dL — ABNORMAL HIGH (ref 70–99)
Glucose-Capillary: 206 mg/dL — ABNORMAL HIGH (ref 70–99)

## 2019-03-12 NOTE — ED Triage Notes (Signed)
Pt bib ems from dialysis center for acute confusion starting at 1500 today. Pt receive all but 50mins of his dialysis treatment today. Pt alert and oriented to self and place, disoriented to time and situation. No unilateral weakness or facial droop noted.

## 2019-03-12 NOTE — ED Notes (Signed)
Please check blood sugar pt has not eaten

## 2019-03-13 NOTE — ED Notes (Signed)
Pt stated he was leaving. Armband removed.

## 2019-03-14 DIAGNOSIS — I161 Hypertensive emergency: Secondary | ICD-10-CM | POA: Diagnosis not present

## 2019-03-14 DIAGNOSIS — R4182 Altered mental status, unspecified: Secondary | ICD-10-CM | POA: Diagnosis not present

## 2019-03-14 DIAGNOSIS — Z4682 Encounter for fitting and adjustment of non-vascular catheter: Secondary | ICD-10-CM | POA: Diagnosis not present

## 2019-03-14 DIAGNOSIS — U071 COVID-19: Secondary | ICD-10-CM | POA: Diagnosis not present

## 2019-03-14 DIAGNOSIS — I493 Ventricular premature depolarization: Secondary | ICD-10-CM | POA: Diagnosis not present

## 2019-03-14 DIAGNOSIS — Z992 Dependence on renal dialysis: Secondary | ICD-10-CM | POA: Diagnosis not present

## 2019-03-14 DIAGNOSIS — R778 Other specified abnormalities of plasma proteins: Secondary | ICD-10-CM | POA: Diagnosis not present

## 2019-03-14 DIAGNOSIS — B342 Coronavirus infection, unspecified: Secondary | ICD-10-CM | POA: Diagnosis not present

## 2019-03-14 DIAGNOSIS — J9601 Acute respiratory failure with hypoxia: Secondary | ICD-10-CM | POA: Diagnosis not present

## 2019-03-14 DIAGNOSIS — N186 End stage renal disease: Secondary | ICD-10-CM | POA: Diagnosis not present

## 2019-03-14 DIAGNOSIS — J1289 Other viral pneumonia: Secondary | ICD-10-CM | POA: Diagnosis not present

## 2019-03-14 DIAGNOSIS — R633 Feeding difficulties: Secondary | ICD-10-CM | POA: Diagnosis not present

## 2019-03-14 DIAGNOSIS — I120 Hypertensive chronic kidney disease with stage 5 chronic kidney disease or end stage renal disease: Secondary | ICD-10-CM | POA: Diagnosis not present

## 2019-03-14 DIAGNOSIS — R9431 Abnormal electrocardiogram [ECG] [EKG]: Secondary | ICD-10-CM | POA: Diagnosis not present

## 2019-03-14 DIAGNOSIS — Z0389 Encounter for observation for other suspected diseases and conditions ruled out: Secondary | ICD-10-CM | POA: Diagnosis not present

## 2019-03-14 DIAGNOSIS — D631 Anemia in chronic kidney disease: Secondary | ICD-10-CM | POA: Diagnosis not present

## 2019-03-14 DIAGNOSIS — I160 Hypertensive urgency: Secondary | ICD-10-CM | POA: Diagnosis not present

## 2019-03-14 DIAGNOSIS — R4020 Unspecified coma: Secondary | ICD-10-CM | POA: Diagnosis not present

## 2019-03-14 DIAGNOSIS — D6859 Other primary thrombophilia: Secondary | ICD-10-CM | POA: Diagnosis not present

## 2019-03-14 DIAGNOSIS — Z9989 Dependence on other enabling machines and devices: Secondary | ICD-10-CM | POA: Diagnosis not present

## 2019-03-14 DIAGNOSIS — F419 Anxiety disorder, unspecified: Secondary | ICD-10-CM | POA: Diagnosis not present

## 2019-03-14 DIAGNOSIS — I447 Left bundle-branch block, unspecified: Secondary | ICD-10-CM | POA: Diagnosis not present

## 2019-03-14 DIAGNOSIS — R251 Tremor, unspecified: Secondary | ICD-10-CM | POA: Diagnosis not present

## 2019-03-14 DIAGNOSIS — E1129 Type 2 diabetes mellitus with other diabetic kidney complication: Secondary | ICD-10-CM | POA: Diagnosis not present

## 2019-03-14 DIAGNOSIS — R404 Transient alteration of awareness: Secondary | ICD-10-CM | POA: Diagnosis not present

## 2019-03-14 DIAGNOSIS — I132 Hypertensive heart and chronic kidney disease with heart failure and with stage 5 chronic kidney disease, or end stage renal disease: Secondary | ICD-10-CM | POA: Diagnosis not present

## 2019-03-14 DIAGNOSIS — J189 Pneumonia, unspecified organism: Secondary | ICD-10-CM | POA: Diagnosis not present

## 2019-03-14 DIAGNOSIS — R579 Shock, unspecified: Secondary | ICD-10-CM | POA: Diagnosis not present

## 2019-03-14 DIAGNOSIS — I21A1 Myocardial infarction type 2: Secondary | ICD-10-CM | POA: Diagnosis not present

## 2019-03-14 DIAGNOSIS — J810 Acute pulmonary edema: Secondary | ICD-10-CM | POA: Diagnosis not present

## 2019-03-14 DIAGNOSIS — R0902 Hypoxemia: Secondary | ICD-10-CM | POA: Diagnosis not present

## 2019-03-14 DIAGNOSIS — R739 Hyperglycemia, unspecified: Secondary | ICD-10-CM | POA: Diagnosis not present

## 2019-03-14 DIAGNOSIS — G9341 Metabolic encephalopathy: Secondary | ICD-10-CM | POA: Diagnosis not present

## 2019-03-14 DIAGNOSIS — Z8673 Personal history of transient ischemic attack (TIA), and cerebral infarction without residual deficits: Secondary | ICD-10-CM | POA: Diagnosis not present

## 2019-03-14 DIAGNOSIS — J989 Respiratory disorder, unspecified: Secondary | ICD-10-CM | POA: Diagnosis not present

## 2019-03-14 DIAGNOSIS — J449 Chronic obstructive pulmonary disease, unspecified: Secondary | ICD-10-CM | POA: Diagnosis not present

## 2019-03-14 DIAGNOSIS — N2581 Secondary hyperparathyroidism of renal origin: Secondary | ICD-10-CM | POA: Diagnosis not present

## 2019-03-14 DIAGNOSIS — G9340 Encephalopathy, unspecified: Secondary | ICD-10-CM | POA: Diagnosis not present

## 2019-03-14 DIAGNOSIS — E1122 Type 2 diabetes mellitus with diabetic chronic kidney disease: Secondary | ICD-10-CM | POA: Diagnosis not present

## 2019-03-14 DIAGNOSIS — J80 Acute respiratory distress syndrome: Secondary | ICD-10-CM | POA: Diagnosis not present

## 2019-03-14 DIAGNOSIS — Z794 Long term (current) use of insulin: Secondary | ICD-10-CM | POA: Diagnosis not present

## 2019-03-14 DIAGNOSIS — E119 Type 2 diabetes mellitus without complications: Secondary | ICD-10-CM | POA: Diagnosis not present

## 2019-03-14 DIAGNOSIS — R0602 Shortness of breath: Secondary | ICD-10-CM | POA: Diagnosis not present

## 2019-03-14 DIAGNOSIS — I480 Paroxysmal atrial fibrillation: Secondary | ICD-10-CM | POA: Diagnosis not present

## 2019-03-14 DIAGNOSIS — E1165 Type 2 diabetes mellitus with hyperglycemia: Secondary | ICD-10-CM | POA: Diagnosis not present

## 2019-03-14 DIAGNOSIS — I444 Left anterior fascicular block: Secondary | ICD-10-CM | POA: Diagnosis not present

## 2019-03-14 DIAGNOSIS — R140 Abdominal distension (gaseous): Secondary | ICD-10-CM | POA: Diagnosis not present

## 2019-03-28 DIAGNOSIS — N186 End stage renal disease: Secondary | ICD-10-CM | POA: Diagnosis not present

## 2019-03-28 DIAGNOSIS — Z992 Dependence on renal dialysis: Secondary | ICD-10-CM | POA: Diagnosis not present

## 2019-03-28 DIAGNOSIS — E1129 Type 2 diabetes mellitus with other diabetic kidney complication: Secondary | ICD-10-CM | POA: Diagnosis not present

## 2019-04-02 DIAGNOSIS — J440 Chronic obstructive pulmonary disease with acute lower respiratory infection: Secondary | ICD-10-CM | POA: Diagnosis not present

## 2019-04-29 DEATH — deceased

## 2020-04-02 IMAGING — DX DG CHEST 2V
2 series · 2 of 2 positions shown · non-contrast
Comparison: 11/21/2017

CLINICAL DATA: Chest pain

EXAM:
CHEST - 2 VIEW

[w chest lat]
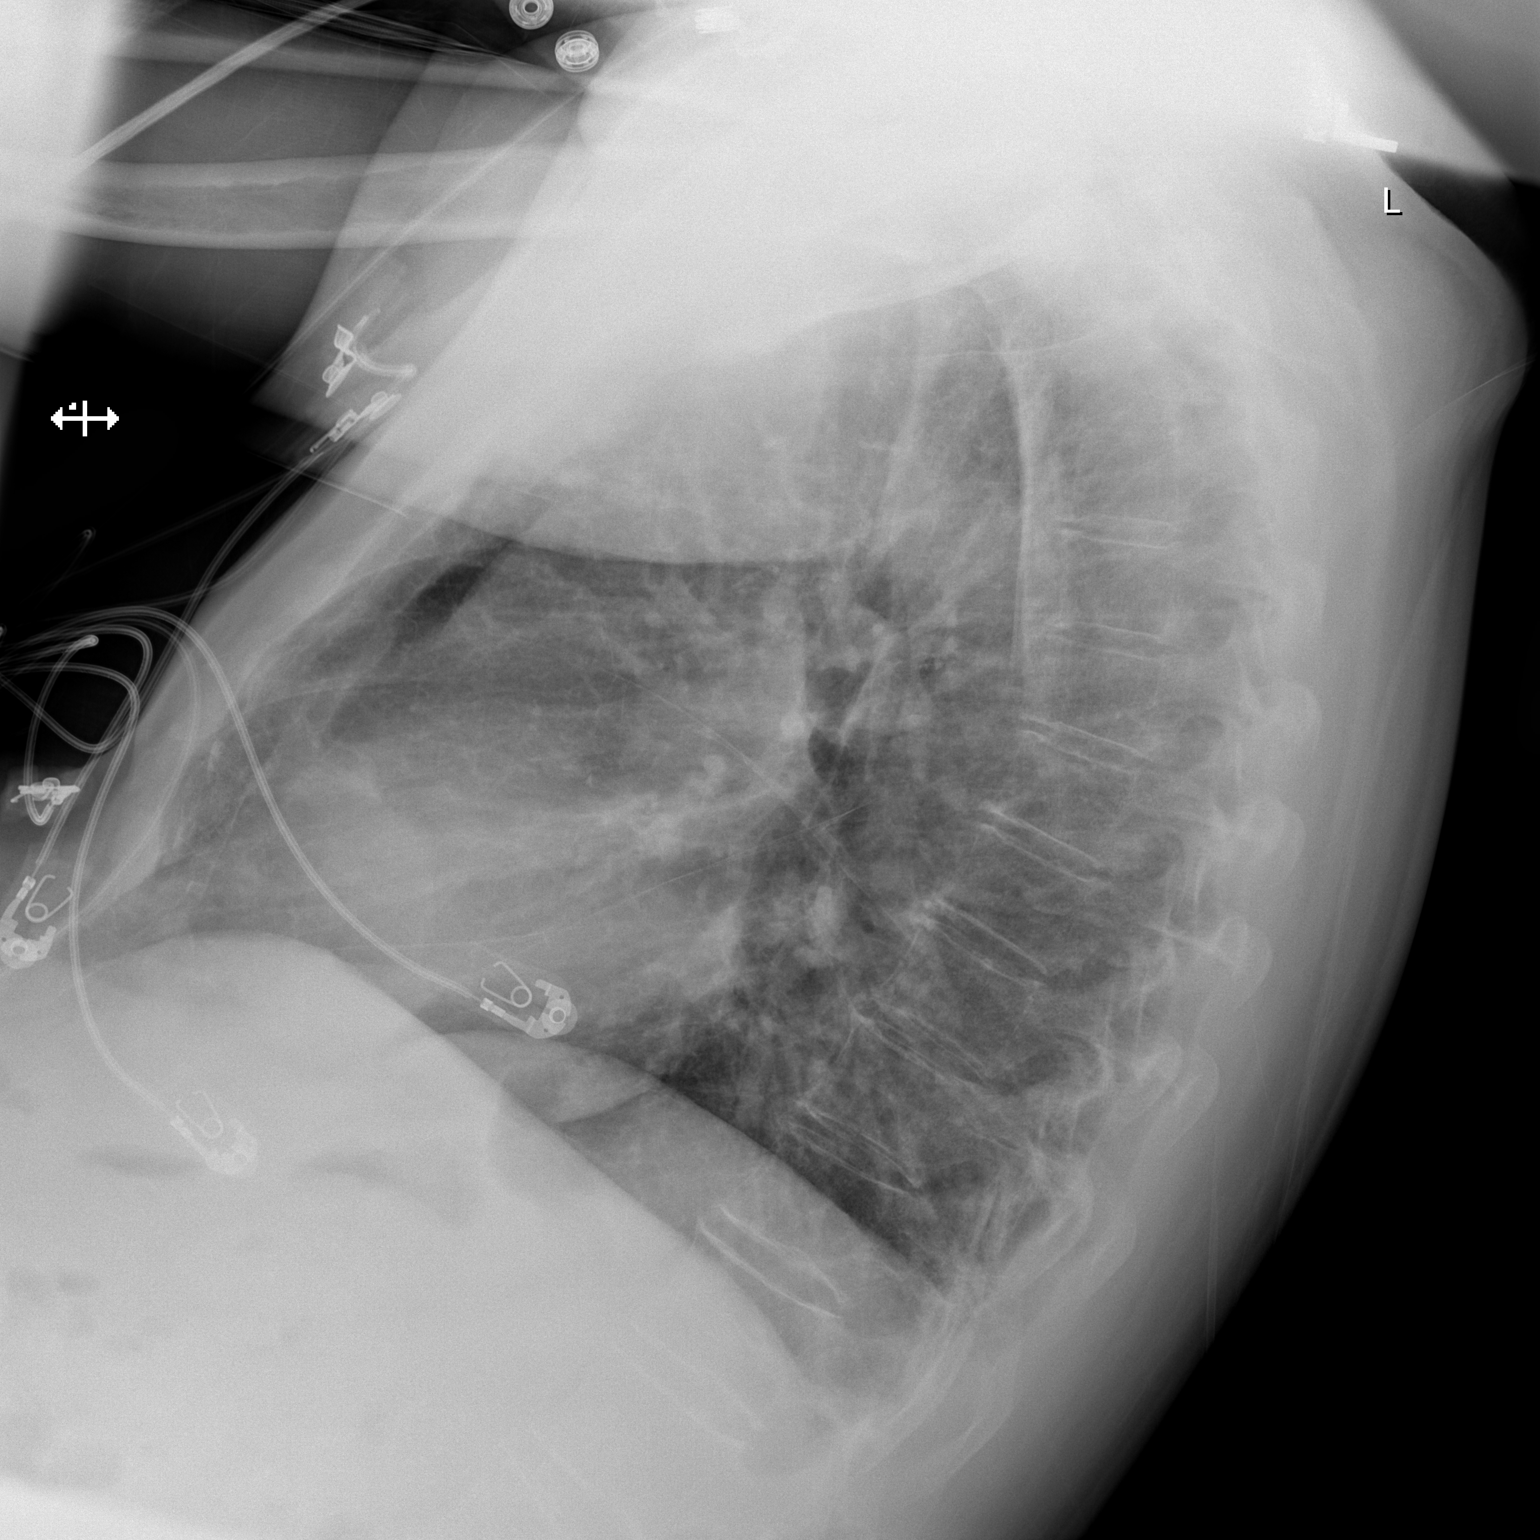

[x chest ap]
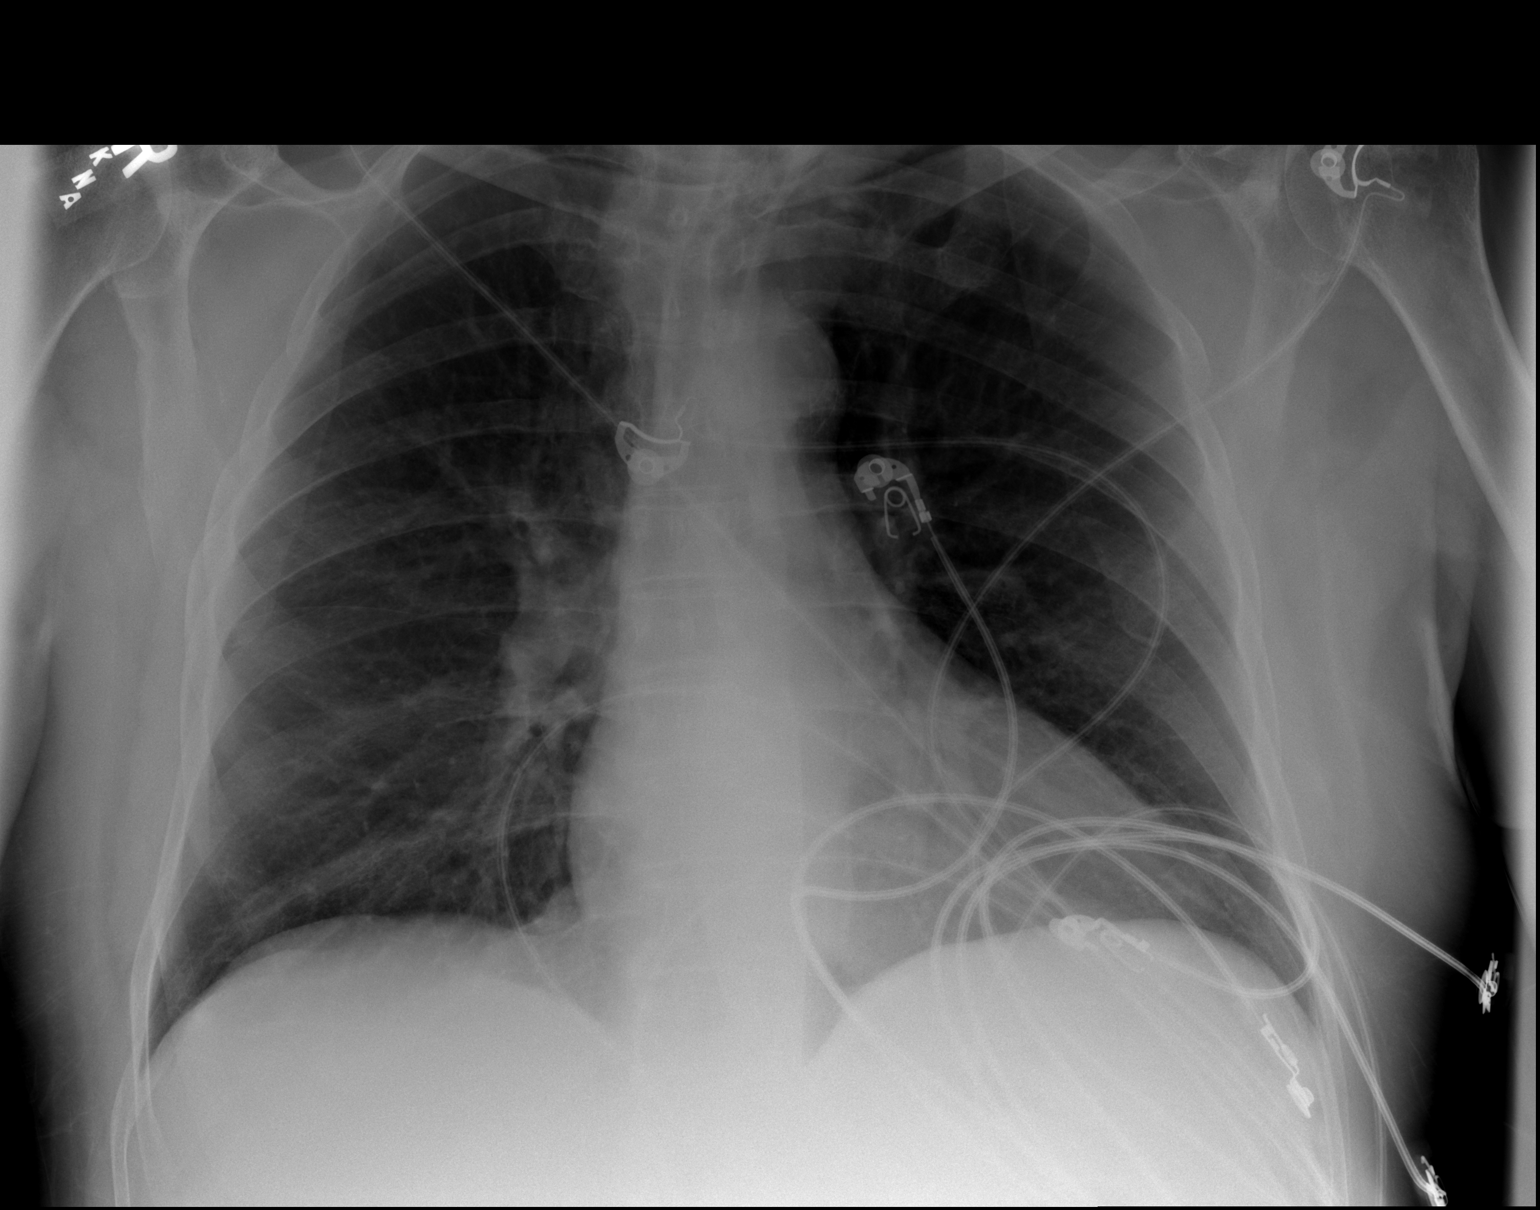

[2 of 2 positions shown; findings below may reference images not displayed]

FINDINGS: The heart size and mediastinal contours are within normal limits.
Both lungs are clear. The visualized skeletal structures are
unremarkable.
IMPRESSION: No active cardiopulmonary disease.
